# Patient Record
Sex: Female | Born: 1935
Health system: Southern US, Community
[De-identification: ages and names within clinical notes are randomized; demographics above are authoritative.]

## PROBLEM LIST (undated history)

## (undated) DIAGNOSIS — G4700 Insomnia, unspecified: Secondary | ICD-10-CM

## (undated) DIAGNOSIS — F329 Major depressive disorder, single episode, unspecified: Secondary | ICD-10-CM

## (undated) DIAGNOSIS — E785 Hyperlipidemia, unspecified: Secondary | ICD-10-CM

## (undated) DIAGNOSIS — K219 Gastro-esophageal reflux disease without esophagitis: Secondary | ICD-10-CM

## (undated) DIAGNOSIS — E039 Hypothyroidism, unspecified: Secondary | ICD-10-CM

## (undated) DIAGNOSIS — F32A Depression, unspecified: Secondary | ICD-10-CM

## (undated) HISTORY — PX: OTHER SURGICAL HISTORY: SHX169

## (undated) HISTORY — PX: NISSEN FUNDOPLICATION: SHX2091

## (undated) HISTORY — PX: ABDOMINAL HYSTERECTOMY: SHX81

## (undated) HISTORY — PX: BREAST BIOPSY: SHX20

## (undated) HISTORY — PX: COLON SURGERY: SHX602

---

## 2004-09-21 ENCOUNTER — Ambulatory Visit: Payer: Self-pay | Admitting: Internal Medicine

## 2005-11-07 ENCOUNTER — Ambulatory Visit: Payer: Self-pay | Admitting: Internal Medicine

## 2007-01-14 ENCOUNTER — Ambulatory Visit: Payer: Self-pay | Admitting: Internal Medicine

## 2007-01-21 ENCOUNTER — Ambulatory Visit: Payer: Self-pay | Admitting: Otolaryngology

## 2007-03-01 ENCOUNTER — Emergency Department: Payer: Self-pay | Admitting: Internal Medicine

## 2007-03-01 ENCOUNTER — Other Ambulatory Visit: Payer: Self-pay

## 2007-03-06 ENCOUNTER — Encounter: Payer: Self-pay | Admitting: Internal Medicine

## 2007-03-21 ENCOUNTER — Encounter: Payer: Self-pay | Admitting: Internal Medicine

## 2007-04-10 ENCOUNTER — Ambulatory Visit: Payer: Self-pay | Admitting: Specialist

## 2007-04-21 ENCOUNTER — Encounter: Payer: Self-pay | Admitting: Internal Medicine

## 2007-04-24 ENCOUNTER — Ambulatory Visit: Payer: Self-pay | Admitting: Specialist

## 2007-05-21 ENCOUNTER — Encounter: Payer: Self-pay | Admitting: Internal Medicine

## 2007-06-12 ENCOUNTER — Ambulatory Visit: Payer: Self-pay | Admitting: Cardiology

## 2007-06-21 ENCOUNTER — Encounter: Payer: Self-pay | Admitting: Internal Medicine

## 2007-07-04 ENCOUNTER — Other Ambulatory Visit: Payer: Self-pay

## 2007-07-04 ENCOUNTER — Inpatient Hospital Stay: Payer: Self-pay | Admitting: Internal Medicine

## 2007-07-17 ENCOUNTER — Inpatient Hospital Stay: Payer: Self-pay | Admitting: General Surgery

## 2007-07-17 ENCOUNTER — Other Ambulatory Visit: Payer: Self-pay

## 2007-07-21 ENCOUNTER — Encounter: Payer: Self-pay | Admitting: Internal Medicine

## 2008-06-21 ENCOUNTER — Inpatient Hospital Stay: Payer: Self-pay | Admitting: Internal Medicine

## 2008-09-07 ENCOUNTER — Ambulatory Visit: Payer: Self-pay | Admitting: Unknown Physician Specialty

## 2008-10-14 ENCOUNTER — Ambulatory Visit: Payer: Self-pay | Admitting: Internal Medicine

## 2008-10-20 ENCOUNTER — Ambulatory Visit: Payer: Self-pay | Admitting: Internal Medicine

## 2009-10-25 ENCOUNTER — Ambulatory Visit: Payer: Self-pay | Admitting: Internal Medicine

## 2009-11-07 ENCOUNTER — Ambulatory Visit: Payer: Self-pay | Admitting: Internal Medicine

## 2009-12-08 ENCOUNTER — Ambulatory Visit: Payer: Self-pay | Admitting: Pulmonary Disease

## 2009-12-08 DIAGNOSIS — R05 Cough: Secondary | ICD-10-CM

## 2009-12-08 DIAGNOSIS — I10 Essential (primary) hypertension: Secondary | ICD-10-CM | POA: Insufficient documentation

## 2009-12-08 DIAGNOSIS — R059 Cough, unspecified: Secondary | ICD-10-CM | POA: Insufficient documentation

## 2010-02-06 ENCOUNTER — Ambulatory Visit: Payer: Self-pay | Admitting: Pulmonary Disease

## 2010-04-26 ENCOUNTER — Ambulatory Visit: Payer: Self-pay | Admitting: Surgery

## 2010-07-10 ENCOUNTER — Ambulatory Visit: Payer: Self-pay | Admitting: Specialist
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
## 2010-10-31 ENCOUNTER — Emergency Department: Payer: Self-pay | Admitting: Emergency Medicine

## 2010-11-06 ENCOUNTER — Emergency Department: Payer: Self-pay | Admitting: Emergency Medicine

## 2011-03-08 ENCOUNTER — Ambulatory Visit: Payer: Self-pay | Admitting: Internal Medicine

## 2012-03-10 ENCOUNTER — Ambulatory Visit: Payer: Self-pay | Admitting: Internal Medicine

## 2012-08-29 ENCOUNTER — Ambulatory Visit: Payer: Self-pay | Admitting: Specialist

## 2013-03-11 ENCOUNTER — Ambulatory Visit: Payer: Self-pay | Admitting: Internal Medicine

## 2013-07-02 ENCOUNTER — Encounter: Payer: Self-pay | Admitting: Neurology

## 2013-12-31 ENCOUNTER — Ambulatory Visit: Payer: Self-pay | Admitting: Ophthalmology

## 2014-01-01 DIAGNOSIS — E039 Hypothyroidism, unspecified: Secondary | ICD-10-CM | POA: Insufficient documentation

## 2014-01-01 DIAGNOSIS — E785 Hyperlipidemia, unspecified: Secondary | ICD-10-CM

## 2014-01-01 DIAGNOSIS — F329 Major depressive disorder, single episode, unspecified: Secondary | ICD-10-CM | POA: Insufficient documentation

## 2014-01-01 DIAGNOSIS — E782 Mixed hyperlipidemia: Secondary | ICD-10-CM | POA: Insufficient documentation

## 2014-01-01 DIAGNOSIS — M1990 Unspecified osteoarthritis, unspecified site: Secondary | ICD-10-CM | POA: Insufficient documentation

## 2014-01-01 DIAGNOSIS — F32A Depression, unspecified: Secondary | ICD-10-CM | POA: Insufficient documentation

## 2014-01-01 DIAGNOSIS — K219 Gastro-esophageal reflux disease without esophagitis: Secondary | ICD-10-CM | POA: Insufficient documentation

## 2014-01-01 HISTORY — DX: Hyperlipidemia, unspecified: E78.5

## 2014-01-12 ENCOUNTER — Ambulatory Visit: Payer: Self-pay | Admitting: Ophthalmology

## 2014-02-17 ENCOUNTER — Ambulatory Visit: Payer: Self-pay | Admitting: Ophthalmology

## 2014-05-20 ENCOUNTER — Ambulatory Visit: Payer: Self-pay | Admitting: Internal Medicine

## 2014-10-08 DIAGNOSIS — H35371 Puckering of macula, right eye: Secondary | ICD-10-CM | POA: Diagnosis not present

## 2014-10-25 DIAGNOSIS — J986 Disorders of diaphragm: Secondary | ICD-10-CM | POA: Diagnosis not present

## 2014-10-25 DIAGNOSIS — J849 Interstitial pulmonary disease, unspecified: Secondary | ICD-10-CM | POA: Diagnosis not present

## 2014-10-25 DIAGNOSIS — R05 Cough: Secondary | ICD-10-CM | POA: Diagnosis not present

## 2014-10-25 DIAGNOSIS — R0609 Other forms of dyspnea: Secondary | ICD-10-CM | POA: Diagnosis not present

## 2014-10-27 DIAGNOSIS — M170 Bilateral primary osteoarthritis of knee: Secondary | ICD-10-CM | POA: Diagnosis not present

## 2014-11-03 ENCOUNTER — Ambulatory Visit: Payer: Self-pay | Admitting: Specialist

## 2014-11-03 DIAGNOSIS — R05 Cough: Secondary | ICD-10-CM | POA: Diagnosis not present

## 2014-11-03 DIAGNOSIS — R918 Other nonspecific abnormal finding of lung field: Secondary | ICD-10-CM | POA: Diagnosis not present

## 2014-11-03 DIAGNOSIS — K449 Diaphragmatic hernia without obstruction or gangrene: Secondary | ICD-10-CM | POA: Diagnosis not present

## 2014-11-03 DIAGNOSIS — I2510 Atherosclerotic heart disease of native coronary artery without angina pectoris: Secondary | ICD-10-CM | POA: Diagnosis not present

## 2014-11-10 DIAGNOSIS — R918 Other nonspecific abnormal finding of lung field: Secondary | ICD-10-CM | POA: Diagnosis not present

## 2014-11-10 DIAGNOSIS — J986 Disorders of diaphragm: Secondary | ICD-10-CM | POA: Diagnosis not present

## 2014-11-10 DIAGNOSIS — R0609 Other forms of dyspnea: Secondary | ICD-10-CM | POA: Diagnosis not present

## 2014-11-10 DIAGNOSIS — R05 Cough: Secondary | ICD-10-CM | POA: Diagnosis not present

## 2014-11-11 DIAGNOSIS — J028 Acute pharyngitis due to other specified organisms: Secondary | ICD-10-CM | POA: Diagnosis not present

## 2014-11-11 DIAGNOSIS — B9689 Other specified bacterial agents as the cause of diseases classified elsewhere: Secondary | ICD-10-CM | POA: Diagnosis not present

## 2014-11-11 DIAGNOSIS — R05 Cough: Secondary | ICD-10-CM | POA: Diagnosis not present

## 2014-11-11 DIAGNOSIS — R911 Solitary pulmonary nodule: Secondary | ICD-10-CM | POA: Diagnosis not present

## 2014-11-24 ENCOUNTER — Ambulatory Visit
Admit: 2014-11-24 | Disposition: A | Discharge status: Home / Self Care | Payer: Self-pay | Attending: Internal Medicine | Admitting: Internal Medicine

## 2014-11-24 DIAGNOSIS — R32 Unspecified urinary incontinence: Secondary | ICD-10-CM | POA: Diagnosis not present

## 2014-11-24 DIAGNOSIS — F064 Anxiety disorder due to known physiological condition: Secondary | ICD-10-CM | POA: Diagnosis not present

## 2014-11-24 DIAGNOSIS — K449 Diaphragmatic hernia without obstruction or gangrene: Secondary | ICD-10-CM | POA: Diagnosis not present

## 2014-11-24 DIAGNOSIS — R05 Cough: Secondary | ICD-10-CM | POA: Diagnosis not present

## 2014-11-24 DIAGNOSIS — J9809 Other diseases of bronchus, not elsewhere classified: Secondary | ICD-10-CM | POA: Diagnosis not present

## 2014-12-01 DIAGNOSIS — R0789 Other chest pain: Secondary | ICD-10-CM | POA: Diagnosis not present

## 2014-12-01 DIAGNOSIS — R32 Unspecified urinary incontinence: Secondary | ICD-10-CM | POA: Diagnosis not present

## 2014-12-01 DIAGNOSIS — R05 Cough: Secondary | ICD-10-CM | POA: Diagnosis not present

## 2014-12-01 DIAGNOSIS — K210 Gastro-esophageal reflux disease with esophagitis: Secondary | ICD-10-CM | POA: Diagnosis not present

## 2014-12-08 ENCOUNTER — Ambulatory Visit
Admit: 2014-12-08 | Disposition: A | Discharge status: Home / Self Care | Payer: Self-pay | Attending: Internal Medicine | Admitting: Internal Medicine

## 2014-12-08 DIAGNOSIS — F064 Anxiety disorder due to known physiological condition: Secondary | ICD-10-CM | POA: Diagnosis not present

## 2014-12-08 DIAGNOSIS — M25552 Pain in left hip: Secondary | ICD-10-CM | POA: Diagnosis not present

## 2014-12-08 DIAGNOSIS — S79912A Unspecified injury of left hip, initial encounter: Secondary | ICD-10-CM | POA: Diagnosis not present

## 2014-12-08 DIAGNOSIS — R32 Unspecified urinary incontinence: Secondary | ICD-10-CM | POA: Diagnosis not present

## 2014-12-11 NOTE — Op Note (Signed)
PATIENT NAME:  Abigail Strong, Abigail Strong MR#:  [id number] DATE OF BIRTH:  12-23-1929  DATE OF PROCEDURE:  01/12/2014  PREOPERATIVE DIAGNOSIS: Visually significant cataract of the left eye.   POSTOPERATIVE DIAGNOSIS: Visually significant cataract of the left eye.   OPERATIVE PROCEDURE: Cataract extraction by phacoemulsification with implant of intraocular lens to left eye.   SURGEON: Birder Robson, MD.   ANESTHESIA:  1. Managed anesthesia care.  2. Topical tetracaine drops followed by 2% Xylocaine jelly applied in the preoperative holding area.   COMPLICATIONS: None.   TECHNIQUE:  Stop-and-chop.  DESCRIPTION OF PROCEDURE: The patient was examined and consented in the preoperative holding area where the aforementioned topical anesthesia was applied to the left eye and then brought back to the Operating Room where the left eye was prepped and draped in the usual sterile ophthalmic fashion and a lid speculum was placed. A paracentesis was created with the side port blade and the anterior chamber was filled with viscoelastic. A near clear corneal incision was performed with the steel keratome. A continuous curvilinear capsulorrhexis was performed with a cystotome followed by the capsulorrhexis forceps. Hydrodissection and hydrodelineation were carried out with BSS on a blunt cannula. The lens was removed in a stop-and-chop technique and the remaining cortical material was removed with the irrigation-aspiration handpiece. The capsular bag was inflated with viscoelastic and the Tecnis ZCB00, 22.5-diopter lens, serial number [id number], was placed in the capsular bag without complication. The remaining viscoelastic was removed from the eye with the irrigation-aspiration handpiece. The wounds were hydrated. The anterior chamber was flushed with Miostat and the eye was inflated to physiologic pressure. 0.1 mL of cefuroxime concentration 10 mg/mL was placed in the anterior chamber. The wounds were found to be  water tight. The eye was dressed with Vigamox. The patient was given protective glasses to wear throughout the day and a shield with which to sleep tonight. The patient was also given drops with which to begin a drop regimen today and will follow-up with me in one day.   ____________________________ Livingston Diones. Danyel Tobey, MD wlp:cs D: 01/12/2014 17:20:00 ET T: 01/12/2014 19:40:51 ET JOB#: [id number]  cc: Andree Golphin L. Merriam Brandner, MD, <Dictator> Livingston Diones Cami Delawder MD ELECTRONICALLY SIGNED 01/20/2014 13:43

## 2014-12-15 DIAGNOSIS — R32 Unspecified urinary incontinence: Secondary | ICD-10-CM | POA: Diagnosis not present

## 2014-12-15 DIAGNOSIS — J439 Emphysema, unspecified: Secondary | ICD-10-CM | POA: Diagnosis not present

## 2014-12-15 DIAGNOSIS — M25552 Pain in left hip: Secondary | ICD-10-CM | POA: Diagnosis not present

## 2014-12-16 ENCOUNTER — Ambulatory Visit
Admit: 2014-12-16 | Disposition: A | Discharge status: Home / Self Care | Payer: Self-pay | Attending: Internal Medicine | Admitting: Internal Medicine

## 2014-12-16 DIAGNOSIS — Z1231 Encounter for screening mammogram for malignant neoplasm of breast: Secondary | ICD-10-CM | POA: Diagnosis not present

## 2015-01-21 DIAGNOSIS — M170 Bilateral primary osteoarthritis of knee: Secondary | ICD-10-CM | POA: Diagnosis not present

## 2015-02-02 DIAGNOSIS — F064 Anxiety disorder due to known physiological condition: Secondary | ICD-10-CM | POA: Diagnosis not present

## 2015-02-02 DIAGNOSIS — R32 Unspecified urinary incontinence: Secondary | ICD-10-CM | POA: Diagnosis not present

## 2015-02-02 DIAGNOSIS — F5102 Adjustment insomnia: Secondary | ICD-10-CM | POA: Diagnosis not present

## 2015-02-02 DIAGNOSIS — R918 Other nonspecific abnormal finding of lung field: Secondary | ICD-10-CM | POA: Diagnosis not present

## 2015-02-14 ENCOUNTER — Other Ambulatory Visit (HOSPITAL_COMMUNITY): Payer: Self-pay | Admitting: Cardiology

## 2015-02-14 DIAGNOSIS — R61 Generalized hyperhidrosis: Secondary | ICD-10-CM | POA: Diagnosis not present

## 2015-02-14 DIAGNOSIS — R911 Solitary pulmonary nodule: Secondary | ICD-10-CM

## 2015-02-14 DIAGNOSIS — B359 Dermatophytosis, unspecified: Secondary | ICD-10-CM | POA: Diagnosis not present

## 2015-02-14 DIAGNOSIS — F064 Anxiety disorder due to known physiological condition: Secondary | ICD-10-CM | POA: Diagnosis not present

## 2015-02-16 DIAGNOSIS — M170 Bilateral primary osteoarthritis of knee: Secondary | ICD-10-CM | POA: Diagnosis not present

## 2015-02-22 ENCOUNTER — Ambulatory Visit: Admission: RE | Admit: 2015-02-22 | Payer: Medicare Other | Source: Ambulatory Visit

## 2015-02-22 ENCOUNTER — Ambulatory Visit
Admission: RE | Admit: 2015-02-22 | Discharge: 2015-02-22 | Disposition: A | Discharge status: Home / Self Care | Payer: Medicare Other | Source: Ambulatory Visit | Attending: Cardiology | Admitting: Cardiology

## 2015-02-22 DIAGNOSIS — R918 Other nonspecific abnormal finding of lung field: Secondary | ICD-10-CM | POA: Diagnosis not present

## 2015-02-22 DIAGNOSIS — R911 Solitary pulmonary nodule: Secondary | ICD-10-CM

## 2015-02-22 DIAGNOSIS — R05 Cough: Secondary | ICD-10-CM | POA: Diagnosis not present

## 2015-02-23 DIAGNOSIS — M170 Bilateral primary osteoarthritis of knee: Secondary | ICD-10-CM | POA: Diagnosis not present

## 2015-03-01 DIAGNOSIS — R32 Unspecified urinary incontinence: Secondary | ICD-10-CM | POA: Diagnosis not present

## 2015-03-01 DIAGNOSIS — R911 Solitary pulmonary nodule: Secondary | ICD-10-CM | POA: Diagnosis not present

## 2015-03-01 DIAGNOSIS — F064 Anxiety disorder due to known physiological condition: Secondary | ICD-10-CM | POA: Diagnosis not present

## 2015-03-02 DIAGNOSIS — M170 Bilateral primary osteoarthritis of knee: Secondary | ICD-10-CM | POA: Diagnosis not present

## 2015-03-17 DIAGNOSIS — R42 Dizziness and giddiness: Secondary | ICD-10-CM | POA: Diagnosis not present

## 2015-03-17 DIAGNOSIS — R32 Unspecified urinary incontinence: Secondary | ICD-10-CM | POA: Diagnosis not present

## 2015-03-17 DIAGNOSIS — H8193 Unspecified disorder of vestibular function, bilateral: Secondary | ICD-10-CM | POA: Diagnosis not present

## 2015-03-22 ENCOUNTER — Emergency Department: Payer: Medicare Other

## 2015-03-22 ENCOUNTER — Encounter: Payer: Self-pay | Admitting: Occupational Medicine

## 2015-03-22 ENCOUNTER — Inpatient Hospital Stay
Admission: EM | Admit: 2015-03-22 | Discharge: 2015-03-25 | DRG: 392 | Disposition: A | Discharge status: Home / Self Care | Payer: Medicare Other | Attending: Internal Medicine | Admitting: Internal Medicine

## 2015-03-22 DIAGNOSIS — E785 Hyperlipidemia, unspecified: Secondary | ICD-10-CM | POA: Diagnosis present

## 2015-03-22 DIAGNOSIS — Z79899 Other long term (current) drug therapy: Secondary | ICD-10-CM

## 2015-03-22 DIAGNOSIS — K449 Diaphragmatic hernia without obstruction or gangrene: Secondary | ICD-10-CM | POA: Diagnosis not present

## 2015-03-22 DIAGNOSIS — Z791 Long term (current) use of non-steroidal anti-inflammatories (NSAID): Secondary | ICD-10-CM

## 2015-03-22 DIAGNOSIS — F329 Major depressive disorder, single episode, unspecified: Secondary | ICD-10-CM | POA: Diagnosis present

## 2015-03-22 DIAGNOSIS — K5732 Diverticulitis of large intestine without perforation or abscess without bleeding: Principal | ICD-10-CM | POA: Diagnosis present

## 2015-03-22 DIAGNOSIS — R531 Weakness: Secondary | ICD-10-CM

## 2015-03-22 DIAGNOSIS — Z9079 Acquired absence of other genital organ(s): Secondary | ICD-10-CM | POA: Diagnosis not present

## 2015-03-22 DIAGNOSIS — Z7982 Long term (current) use of aspirin: Secondary | ICD-10-CM | POA: Diagnosis not present

## 2015-03-22 DIAGNOSIS — Z90710 Acquired absence of both cervix and uterus: Secondary | ICD-10-CM | POA: Diagnosis not present

## 2015-03-22 DIAGNOSIS — Z9049 Acquired absence of other specified parts of digestive tract: Secondary | ICD-10-CM | POA: Diagnosis present

## 2015-03-22 DIAGNOSIS — M4310 Spondylolisthesis, site unspecified: Secondary | ICD-10-CM | POA: Diagnosis present

## 2015-03-22 DIAGNOSIS — K219 Gastro-esophageal reflux disease without esophagitis: Secondary | ICD-10-CM | POA: Diagnosis present

## 2015-03-22 DIAGNOSIS — K4030 Unilateral inguinal hernia, with obstruction, without gangrene, not specified as recurrent: Secondary | ICD-10-CM | POA: Diagnosis not present

## 2015-03-22 DIAGNOSIS — K5792 Diverticulitis of intestine, part unspecified, without perforation or abscess without bleeding: Secondary | ICD-10-CM | POA: Diagnosis present

## 2015-03-22 DIAGNOSIS — Z9889 Other specified postprocedural states: Secondary | ICD-10-CM | POA: Diagnosis not present

## 2015-03-22 DIAGNOSIS — G4700 Insomnia, unspecified: Secondary | ICD-10-CM | POA: Diagnosis not present

## 2015-03-22 DIAGNOSIS — E039 Hypothyroidism, unspecified: Secondary | ICD-10-CM | POA: Diagnosis not present

## 2015-03-22 DIAGNOSIS — R1030 Lower abdominal pain, unspecified: Secondary | ICD-10-CM | POA: Diagnosis not present

## 2015-03-22 DIAGNOSIS — R05 Cough: Secondary | ICD-10-CM | POA: Diagnosis not present

## 2015-03-22 HISTORY — DX: Insomnia, unspecified: G47.00

## 2015-03-22 HISTORY — DX: Hyperlipidemia, unspecified: E78.5

## 2015-03-22 HISTORY — DX: Gastro-esophageal reflux disease without esophagitis: K21.9

## 2015-03-22 HISTORY — DX: Major depressive disorder, single episode, unspecified: F32.9

## 2015-03-22 HISTORY — DX: Hypothyroidism, unspecified: E03.9

## 2015-03-22 HISTORY — DX: Depression, unspecified: F32.A

## 2015-03-22 LAB — URINALYSIS COMPLETE WITH MICROSCOPIC (ARMC ONLY)
Bilirubin Urine: NEGATIVE
Glucose, UA: NEGATIVE mg/dL
Hgb urine dipstick: NEGATIVE
Leukocytes, UA: NEGATIVE
Nitrite: NEGATIVE
Protein, ur: NEGATIVE mg/dL
Specific Gravity, Urine: 1.008 (ref 1.005–1.030)
pH: 6 (ref 5.0–8.0)

## 2015-03-22 LAB — CBC
HCT: 38.2 % (ref 35.0–47.0)
Hemoglobin: 12.7 g/dL (ref 12.0–16.0)
MCH: 29.3 pg (ref 26.0–34.0)
MCHC: 33.2 g/dL (ref 32.0–36.0)
MCV: 88.2 fL (ref 80.0–100.0)
Platelets: 193 10*3/uL (ref 150–440)
RBC: 4.33 MIL/uL (ref 3.80–5.20)
RDW: 14.4 % (ref 11.5–14.5)
WBC: 14.1 10*3/uL — ABNORMAL HIGH (ref 3.6–11.0)

## 2015-03-22 LAB — BASIC METABOLIC PANEL
Anion gap: 9 (ref 5–15)
BUN: 19 mg/dL (ref 6–20)
CO2: 27 mmol/L (ref 22–32)
Calcium: 9 mg/dL (ref 8.9–10.3)
Chloride: 101 mmol/L (ref 101–111)
Creatinine, Ser: 0.68 mg/dL (ref 0.44–1.00)
GFR calc Af Amer: >60 mL/min (ref >60)
GFR calc non Af Amer: >60 mL/min (ref >60)
Glucose, Bld: 97 mg/dL (ref 65–99)
Potassium: 3.9 mmol/L (ref 3.5–5.1)
Sodium: 137 mmol/L (ref 135–145)

## 2015-03-22 LAB — TROPONIN I: Troponin I: <0.03 ng/mL (ref <0.031)

## 2015-03-22 LAB — TSH: TSH: 5.846 u[IU]/mL — ABNORMAL HIGH (ref 0.350–4.500)

## 2015-03-22 MED ORDER — IOHEXOL 240 MG/ML SOLN
25.0000 mL | Freq: Once | INTRAMUSCULAR | Status: AC | PRN
  Administered 2015-03-22: 25 mL via ORAL

## 2015-03-22 MED ORDER — IOHEXOL 300 MG/ML  SOLN
100.0000 mL | Freq: Once | INTRAMUSCULAR | Status: AC | PRN
  Administered 2015-03-22: 100 mL via INTRAVENOUS

## 2015-03-22 MED ORDER — SODIUM CHLORIDE 0.9 % IV BOLUS (SEPSIS)
500.0000 mL | Freq: Once | INTRAVENOUS | Status: AC
  Administered 2015-03-22: 500 mL via INTRAVENOUS
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
## 2015-03-22 NOTE — ED Provider Notes (Addendum)
[facility] Emergency Department Provider Note  ____________________________________________  Time seen: Approximately 945pm  I have reviewed the triage vital signs and the nursing notes.   HISTORY  Chief Complaint Weakness    HPI Abigail Strong is a 81 y.o. female with a history of GERD and hypothyroidism who presents today with 1 day of confusion and fever. She said the fever has resolved today but she just feels weak all over. She says she was having difficulty walking secondary to the weakness. However, she denies any focal weakness. Denies any dysuria but does say she has mild lower abdominal pain. Says she has chronic lower extremity edema which is unchanged. No nausea vomiting or diarrhea. Has a chronic cough which is also unchanged. No known sick contacts.   Past Medical History  Diagnosis Date  . GERD (gastroesophageal reflux disease)   . Thyroid disease   . Insomnia   . Depression   . Hyperlipidemia     Patient Active Problem List   Diagnosis Date Noted  . HYPERTENSION 12/08/2009  . COUGH 12/08/2009    Past Surgical History  Procedure Laterality Date  . Colon surgery    . Abdominal hysterectomy      No current outpatient prescriptions on file.  Allergies Penicillins and Sulfonamide derivatives  History reviewed. No pertinent family history.  Social History History  Substance Use Topics  . Smoking status: Never Smoker   . Smokeless tobacco: Not on file  . Alcohol Use: No    Review of Systems Constitutional: fever yesterday Eyes: No visual changes. ENT: No sore throat. Cardiovascular: Denies chest pain. Respiratory: Denies shortness of breath. Gastrointestinal:   No nausea, no vomiting.  No diarrhea.  No constipation. Genitourinary: Negative for dysuria. Musculoskeletal: Negative for back pain. Skin: Negative for rash. Neurological: Negative for headaches, focal weakness or numbness.  10-point ROS otherwise  negative.  ____________________________________________   PHYSICAL EXAM:  VITAL SIGNS: ED Triage Vitals  Enc Vitals Group     BP 03/22/15 2053 108/74 mmHg     Pulse Rate 03/22/15 2053 87     Resp 03/22/15 2053 20     Temp 03/22/15 2053 98.1 F (36.7 C)     Temp Source 03/22/15 2053 Oral     SpO2 03/22/15 2053 94 %     Weight 03/22/15 2053 174 lb (78.926 kg)     Height 03/22/15 2053 5\' 4"  (1.626 m)     Head Cir --      Peak Flow --      Pain Score --      Pain Loc --      Pain Edu? --      Excl. in [place]? --     Constitutional: Alert and oriented. Well appearing and in no acute distress. Eyes: Conjunctivae are normal. PERRL. EOMI. Head: Atraumatic. Nose: No congestion/rhinnorhea. Mouth/Throat: Mucous membranes are moist.  Oropharynx non-erythematous. Neck: No stridor.   Cardiovascular: Normal rate, regular rhythm. Grossly normal heart sounds.  Good peripheral circulation. Respiratory: Normal respiratory effort.  No retractions. Lungs CTAB. Gastrointestinal: Soft with mild lower abdominal tenderness without focality.. No distention. No abdominal bruits. No CVA tenderness. Musculoskeletal: mild bilateral lower extremity edema which the patient says is at her baseline.  No joint effusions. Neurologic:  Normal speech and language. No gross focal neurologic deficits are appreciated. No gait instability. Skin:  Skin is warm, dry and intact. No rash noted. Psychiatric: Mood and affect are normal. Speech and behavior are normal.  ____________________________________________  LABS (all labs ordered are listed, but only abnormal results are displayed)  Labs Reviewed  CBC - Abnormal; Notable for the following:    WBC 14.1 (*)    All other components within normal limits  URINALYSIS COMPLETEWITH MICROSCOPIC ([facility] ONLY) - Abnormal; Notable for the following:    Color, Urine YELLOW (*)    APPearance CLEAR (*)    Ketones, ur TRACE (*)    Bacteria, UA RARE (*)    Squamous  Epithelial / LPF 0-5 (*)    All other components within normal limits  TSH - Abnormal; Notable for the following:    TSH 5.846 (*)    All other components within normal limits  URINE CULTURE  BASIC METABOLIC PANEL  TROPONIN I   ____________________________________________  EKG  ED ECG REPORT I, Doran Stabler, the attending physician, personally viewed and interpreted this ECG.   Date: 03/22/2015  EKG Time: 2055  Rate: 89  Rhythm: normal sinus rhythm  Axis: left axis deviation  Intervals:none  ST&T Change: no ST segment elevation or depression. No abnormal T-wave inversions.  ____________________________________________  RADIOLOGY  Pending CAT scan of the abdomen and pelvis. ____________________________________________   PROCEDURES    ____________________________________________   INITIAL IMPRESSION / ASSESSMENT AND PLAN / ED COURSE  Pertinent labs & imaging results that were available during my care of the patient were reviewed by me and considered in my medical decision making (see chart for details).  ----------------------------------------- 11:11 PM on 03/22/2015 -----------------------------------------  Patient pending lab work as well as results of the abdomen and pelvis CAT scan. Signed out to Dr. Dahlia Client. ____________________________________________   FINAL CLINICAL IMPRESSION(S) / ED DIAGNOSES  Acute weakness and abdominal pain. Initial visit.    Orbie Pyo, MD 03/22/15 2311  Addition to physical exam above:  Rectal exam without fecal impaction. Small amount of heme negative stool.    Orbie Pyo, MD 03/23/15 [phone]

## 2015-03-22 NOTE — ED Notes (Signed)
Patient transported to CT 

## 2015-03-23 ENCOUNTER — Encounter: Payer: Self-pay | Admitting: Internal Medicine

## 2015-03-23 DIAGNOSIS — Z9889 Other specified postprocedural states: Secondary | ICD-10-CM | POA: Diagnosis not present

## 2015-03-23 DIAGNOSIS — Z9079 Acquired absence of other genital organ(s): Secondary | ICD-10-CM | POA: Diagnosis present

## 2015-03-23 DIAGNOSIS — R531 Weakness: Secondary | ICD-10-CM | POA: Diagnosis present

## 2015-03-23 DIAGNOSIS — Z7982 Long term (current) use of aspirin: Secondary | ICD-10-CM | POA: Diagnosis not present

## 2015-03-23 DIAGNOSIS — K449 Diaphragmatic hernia without obstruction or gangrene: Secondary | ICD-10-CM | POA: Diagnosis present

## 2015-03-23 DIAGNOSIS — K5792 Diverticulitis of intestine, part unspecified, without perforation or abscess without bleeding: Secondary | ICD-10-CM | POA: Diagnosis present

## 2015-03-23 DIAGNOSIS — G4700 Insomnia, unspecified: Secondary | ICD-10-CM | POA: Diagnosis present

## 2015-03-23 DIAGNOSIS — Z9049 Acquired absence of other specified parts of digestive tract: Secondary | ICD-10-CM | POA: Diagnosis present

## 2015-03-23 DIAGNOSIS — Z79899 Other long term (current) drug therapy: Secondary | ICD-10-CM | POA: Diagnosis not present

## 2015-03-23 DIAGNOSIS — F329 Major depressive disorder, single episode, unspecified: Secondary | ICD-10-CM | POA: Diagnosis present

## 2015-03-23 DIAGNOSIS — K219 Gastro-esophageal reflux disease without esophagitis: Secondary | ICD-10-CM | POA: Diagnosis present

## 2015-03-23 DIAGNOSIS — E785 Hyperlipidemia, unspecified: Secondary | ICD-10-CM | POA: Diagnosis present

## 2015-03-23 DIAGNOSIS — M4310 Spondylolisthesis, site unspecified: Secondary | ICD-10-CM | POA: Diagnosis present

## 2015-03-23 DIAGNOSIS — E039 Hypothyroidism, unspecified: Secondary | ICD-10-CM | POA: Diagnosis present

## 2015-03-23 DIAGNOSIS — Z90710 Acquired absence of both cervix and uterus: Secondary | ICD-10-CM | POA: Diagnosis not present

## 2015-03-23 DIAGNOSIS — K5732 Diverticulitis of large intestine without perforation or abscess without bleeding: Secondary | ICD-10-CM | POA: Diagnosis present

## 2015-03-23 DIAGNOSIS — Z791 Long term (current) use of non-steroidal anti-inflammatories (NSAID): Secondary | ICD-10-CM | POA: Diagnosis not present

## 2015-03-23 LAB — TROPONIN I: Troponin I: 0.03 ng/mL (ref <0.031)

## 2015-03-23 LAB — HEMOGLOBIN A1C: Hgb A1c MFr Bld: 5.8 % (ref 4.0–6.0)

## 2015-03-23 MED ORDER — LEVOTHYROXINE SODIUM 75 MCG PO TABS
125.0000 ug | ORAL_TABLET | Freq: Every day | ORAL | Status: DC
  Administered 2015-03-23 – 2015-03-25 (×3): 125 ug via ORAL
  Filled 2015-03-23 (×3): qty 1

## 2015-03-23 MED ORDER — MECLIZINE HCL 25 MG PO TABS
12.5000 mg | ORAL_TABLET | Freq: Two times a day (BID) | ORAL | Status: DC
Start: 2015-03-23 — End: 2015-03-25
  Administered 2015-03-23 – 2015-03-25 (×5): 12.5 mg via ORAL
  Filled 2015-03-23 (×6): qty 1

## 2015-03-23 MED ORDER — PRAVASTATIN SODIUM 20 MG PO TABS
40.0000 mg | ORAL_TABLET | Freq: Every day | ORAL | Status: DC
  Administered 2015-03-23 – 2015-03-24 (×2): 40 mg via ORAL
  Filled 2015-03-23 (×2): qty 2

## 2015-03-23 MED ORDER — SODIUM CHLORIDE 0.9 % IV SOLN
INTRAVENOUS | Status: DC
  Administered 2015-03-23 – 2015-03-25 (×3): via INTRAVENOUS

## 2015-03-23 MED ORDER — ADULT MULTIVITAMIN W/MINERALS CH
1.0000 | ORAL_TABLET | Freq: Every day | ORAL | Status: DC
  Administered 2015-03-23 – 2015-03-25 (×3): 1 via ORAL
  Filled 2015-03-23 (×3): qty 1

## 2015-03-23 MED ORDER — ASPIRIN EC 81 MG PO TBEC
81.0000 mg | DELAYED_RELEASE_TABLET | Freq: Every day | ORAL | Status: DC
  Administered 2015-03-23 – 2015-03-25 (×3): 81 mg via ORAL
  Filled 2015-03-23 (×3): qty 1

## 2015-03-23 MED ORDER — CITALOPRAM HYDROBROMIDE 20 MG PO TABS
20.0000 mg | ORAL_TABLET | Freq: Every day | ORAL | Status: DC
  Administered 2015-03-23 – 2015-03-25 (×3): 20 mg via ORAL
  Filled 2015-03-23 (×3): qty 1

## 2015-03-23 MED ORDER — HEPARIN SODIUM (PORCINE) 5000 UNIT/ML IJ SOLN
5000.0000 [IU] | Freq: Three times a day (TID) | INTRAMUSCULAR | Status: DC
  Administered 2015-03-23 – 2015-03-25 (×7): 5000 [IU] via SUBCUTANEOUS
  Filled 2015-03-23 (×6): qty 1

## 2015-03-23 MED ORDER — METRONIDAZOLE IN NACL 5-0.79 MG/ML-% IV SOLN
500.0000 mg | Freq: Three times a day (TID) | INTRAVENOUS | Status: DC
  Administered 2015-03-23 – 2015-03-25 (×6): 500 mg via INTRAVENOUS
  Filled 2015-03-23 (×10): qty 100

## 2015-03-23 MED ORDER — ONDANSETRON HCL 4 MG PO TABS: 4.0000 mg | ORAL_TABLET | Freq: Four times a day (QID) | ORAL | Status: DC | PRN

## 2015-03-23 MED ORDER — CIPROFLOXACIN IN D5W 400 MG/200ML IV SOLN
400.0000 mg | Freq: Two times a day (BID) | INTRAVENOUS | Status: DC
  Administered 2015-03-23 – 2015-03-25 (×4): 400 mg via INTRAVENOUS
  Filled 2015-03-23 (×7): qty 200

## 2015-03-23 MED ORDER — CIPROFLOXACIN HCL 500 MG PO TABS
750.0000 mg | ORAL_TABLET | Freq: Two times a day (BID) | ORAL | Status: DC
  Administered 2015-03-23: 750 mg via ORAL
  Filled 2015-03-23: qty 1

## 2015-03-23 MED ORDER — VITAMIN C 500 MG PO TABS
1000.0000 mg | ORAL_TABLET | Freq: Every day | ORAL | Status: DC
  Administered 2015-03-23 – 2015-03-25 (×3): 1000 mg via ORAL
  Filled 2015-03-23 (×3): qty 2

## 2015-03-23 MED ORDER — TRAZODONE HCL 50 MG PO TABS
50.0000 mg | ORAL_TABLET | Freq: Every day | ORAL | Status: DC
  Administered 2015-03-23 – 2015-03-24 (×2): 50 mg via ORAL
  Filled 2015-03-23 (×2): qty 1

## 2015-03-23 MED ORDER — VITAMIN D 1000 UNITS PO TABS
5000.0000 [IU] | ORAL_TABLET | Freq: Every day | ORAL | Status: DC
  Administered 2015-03-23 – 2015-03-25 (×3): 5000 [IU] via ORAL
  Filled 2015-03-23 (×3): qty 5

## 2015-03-23 MED ORDER — CIPROFLOXACIN IN D5W 400 MG/200ML IV SOLN
400.0000 mg | Freq: Once | INTRAVENOUS | Status: AC
  Administered 2015-03-23: 400 mg via INTRAVENOUS
  Filled 2015-03-23: qty 200

## 2015-03-23 MED ORDER — ZOLPIDEM TARTRATE 5 MG PO TABS: 5.0000 mg | ORAL_TABLET | Freq: Every evening | ORAL | Status: DC | PRN

## 2015-03-23 MED ORDER — METRONIDAZOLE IN NACL 5-0.79 MG/ML-% IV SOLN
500.0000 mg | Freq: Once | INTRAVENOUS | Status: AC
  Administered 2015-03-23: 500 mg via INTRAVENOUS
  Filled 2015-03-23: qty 100

## 2015-03-23 MED ORDER — ONDANSETRON HCL 4 MG/2ML IJ SOLN: 4.0000 mg | Freq: Four times a day (QID) | INTRAMUSCULAR | Status: DC | PRN

## 2015-03-23 MED ORDER — METRONIDAZOLE 500 MG PO TABS
500.0000 mg | ORAL_TABLET | Freq: Four times a day (QID) | ORAL | Status: DC
Start: 2015-03-23 — End: 2015-03-23
  Administered 2015-03-23: 500 mg via ORAL
  Filled 2015-03-23: qty 1

## 2015-03-23 MED ORDER — MORPHINE SULFATE 2 MG/ML IJ SOLN: 1.0000 mg | INTRAMUSCULAR | Status: DC | PRN

## 2015-03-23 MED ORDER — DOCUSATE SODIUM 100 MG PO CAPS
100.0000 mg | ORAL_CAPSULE | Freq: Two times a day (BID) | ORAL | Status: DC
  Administered 2015-03-23 – 2015-03-25 (×5): 100 mg via ORAL
  Filled 2015-03-23 (×5): qty 1

## 2015-03-23 NOTE — ED Provider Notes (Signed)
-----------------------------------------   12:37 AM on 03/23/2015 -----------------------------------------   Blood pressure 98/68, pulse 83, temperature 98.1 F (36.7 C), temperature source Oral, resp. rate 25, height 5\' 4"  (1.626 m), weight 174 lb (78.926 kg), SpO2 92 %.  Assuming care from Dr. Clearnce Hasten.  In short, Abigail Strong is a 81 y.o. female with a chief complaint of Weakness .  Refer to the original H&P for additional details.  The current plan of care is to follow up the results of the CT Scan.  CT Scan - Acute Diverticulitis noted at the mid to distal sigmoid colon with associated wall thickening and inflamed diverticula and surrounding soft tissue inflammation. No evidence of perforation or abscess formation at this time. Scattered diverticulosis along the entirety of the sigmoid colon.   I ordered the patient some ciprofloxacin and flagyl IV. He will be admitted to the hospital for IV antibiotics.   Loney Hering, MD 03/23/15 [id number]

## 2015-03-23 NOTE — Care Management (Signed)
Spoke with patient from discharge planning. Patient is from home with two adult children. Stated that she is fairly independent and that she drives herself to appointments. She uses a walker at home and house is on one level. No home 02. PCP is Dr Lavera Guise. No difficulty with medications. No needs anticipated at discharge.

## 2015-03-23 NOTE — H&P (Addendum)
Abigail Strong is an 81 y.o. female.   Chief Complaint: Abdominal pain HPI: The patient presents emergency department complaining of abdominal pain 2 days. He states the pain began in her lower quadrants bilaterally and radiated to her back. Today the pain seemed to worsen and radiate up her right flank. She denies any nausea vomiting or diarrhea. She admits to having some vague chest pain yesterday with associated shortness of breath but both symptoms are gone at this time. In the emergency department the patient underwent a CT scan of her abdomen which showed diverticulitis which prompted the emergency department to call for admission.  Past Medical History  Diagnosis Date  . GERD (gastroesophageal reflux disease)   . Hypothyroidism   . Insomnia   . Depression   . Hyperlipidemia     Past Surgical History  Procedure Laterality Date  . Colon surgery      partial colectomy  . Abdominal hysterectomy    . Nissen fundoplication      History reviewed. No pertinent family history. Social History:  reports that she has never smoked. She does not have any smokeless tobacco history on file. She reports that she does not drink alcohol or use illicit drugs.  Allergies:  Allergies  Allergen Reactions  . Penicillins Other (See Comments)    Dry throat  . Sulfonamide Derivatives     Medications Prior to Admission  Medication Sig Dispense Refill  . acetaminophen (TYLENOL) 500 MG tablet Take 1,000 mg by mouth every 6 (six) hours as needed.    . Ascorbic Acid (VITAMIN C) 1000 MG tablet Take 1,000 mg by mouth daily.    [name] Kitchen aspirin EC 81 MG tablet Take 81 mg by mouth daily.    . Cholecalciferol (VITAMIN D3) 5000 UNITS CAPS Take 5,000 Units by mouth daily.    . citalopram (CELEXA) 20 MG tablet Take 20 mg by mouth daily.    . Ginkgo Biloba 120 MG CAPS Take 120 mg by mouth daily.    [name] Kitchen levothyroxine (SYNTHROID, LEVOTHROID) 125 MCG tablet Take 125 mcg by mouth daily before breakfast.    . meclizine  (ANTIVERT) 12.5 MG tablet Take 12.5 mg by mouth 2 (two) times daily.    . Multiple Vitamins-Minerals (CENTRUM SILVER ADULT 50+) TABS Take 1 tablet by mouth daily.    [name] Kitchen omeprazole (PRILOSEC) 40 MG capsule Take 40 mg by mouth 2 (two) times daily.    . pravastatin (PRAVACHOL) 40 MG tablet Take 40 mg by mouth at bedtime.    . traZODone (DESYREL) 50 MG tablet Take 50 mg by mouth at bedtime.    [name] Kitchen zolpidem (AMBIEN) 10 MG tablet Take 5 mg by mouth at bedtime as needed for sleep.      Results for orders placed or performed during the hospital encounter of 03/22/15 (from the past 48 hour(s))  Basic metabolic panel     Status: None   Collection Time: 03/22/15  9:06 PM  Result Value Ref Range   Sodium 137 135 - 145 mmol/L   Potassium 3.9 3.5 - 5.1 mmol/L   Chloride 101 101 - 111 mmol/L   CO2 27 22 - 32 mmol/L   Glucose, Bld 97 65 - 99 mg/dL   BUN 19 6 - 20 mg/dL   Creatinine, Ser 0.68 0.44 - 1.00 mg/dL   Calcium 9.0 8.9 - 10.3 mg/dL   GFR calc non Af Amer >60 >60 mL/min   GFR calc Af Amer >60 >60 mL/min    Comment: (NOTE)  The eGFR has been calculated using the CKD EPI equation. This calculation has not been validated in all clinical situations. eGFR's persistently <60 mL/min signify possible Chronic Kidney Disease.    Anion gap 9 5 - 15  CBC     Status: Abnormal   Collection Time: 03/22/15  9:06 PM  Result Value Ref Range   WBC 14.1 (H) 3.6 - 11.0 K/uL   RBC 4.33 3.80 - 5.20 MIL/uL   Hemoglobin 12.7 12.0 - 16.0 g/dL   HCT 38.2 35.0 - 47.0 %   MCV 88.2 80.0 - 100.0 fL   MCH 29.3 26.0 - 34.0 pg   MCHC 33.2 32.0 - 36.0 g/dL   RDW 14.4 11.5 - 14.5 %   Platelets 193 150 - 440 K/uL  TSH     Status: Abnormal   Collection Time: 03/22/15  9:06 PM  Result Value Ref Range   TSH 5.846 (H) 0.350 - 4.500 uIU/mL  Troponin I     Status: None   Collection Time: 03/22/15  9:06 PM  Result Value Ref Range   Troponin I <0.03 <0.031 ng/mL    Comment:        NO INDICATION OF MYOCARDIAL INJURY.    Urinalysis complete, with microscopic ([facility] only)     Status: Abnormal   Collection Time: 03/22/15 10:04 PM  Result Value Ref Range   Color, Urine YELLOW (A) YELLOW   APPearance CLEAR (A) CLEAR   Glucose, UA NEGATIVE NEGATIVE mg/dL   Bilirubin Urine NEGATIVE NEGATIVE   Ketones, ur TRACE (A) NEGATIVE mg/dL   Specific Gravity, Urine 1.008 1.005 - 1.030   Hgb urine dipstick NEGATIVE NEGATIVE   pH 6.0 5.0 - 8.0   Protein, ur NEGATIVE NEGATIVE mg/dL   Nitrite NEGATIVE NEGATIVE   Leukocytes, UA NEGATIVE NEGATIVE   RBC / HPF 0-5 0 - 5 RBC/hpf   WBC, UA 0-5 0 - 5 WBC/hpf   Bacteria, UA RARE (A) NONE SEEN   Squamous Epithelial / LPF 0-5 (A) NONE SEEN   Mucous PRESENT   Troponin I     Status: None   Collection Time: 03/23/15  4:13 AM  Result Value Ref Range   Troponin I 0.03 <0.031 ng/mL    Comment:        NO INDICATION OF MYOCARDIAL INJURY.    Dg Chest 2 View  03/22/2015   CLINICAL DATA:  Cough and weakness  EXAM: CHEST  2 VIEW  COMPARISON:  11/24/2014  FINDINGS: There is mild hyperinflation and borderline cardiomegaly, unchanged. There is a hiatal hernia. Mild chronic interstitial coarsening is present. There is no significant alveolar opacity. There is no effusion.  IMPRESSION: Hyperinflation, chronic interstitial coarsening, borderline cardiomegaly, hiatal hernia. No superimposed acute findings.   Electronically Signed   By: Andreas Newport M.D.   On: 03/22/2015 22:23   Ct Abdomen Pelvis W Contrast  03/22/2015   CLINICAL DATA:  Acute onset of lower abdominal pain. Chronic cough. Initial encounter.  EXAM: CT ABDOMEN AND PELVIS WITH CONTRAST  TECHNIQUE: Multidetector CT imaging of the abdomen and pelvis was performed using the standard protocol following bolus administration of intravenous contrast.  CONTRAST:  116mL OMNIPAQUE IOHEXOL 300 MG/ML  SOLN  COMPARISON:  MRI of the lumbar spine performed 07/10/2010, and CT of the abdomen and pelvis performed 07/21/2007  FINDINGS: Scattered tiny  nodules at the lung bases likely reflect remote granulomatous disease. Minimal left basilar scarring is noted. A moderate hiatal hernia is noted.  A calcified granuloma is noted  within the right hepatic lobe. The liver and spleen are otherwise unremarkable. The gallbladder is within normal limits. The pancreas and adrenal glands are unremarkable.  There is partial atrophy of the right kidney. A 2.6 cm cyst is noted at the medial aspect of the left kidney. The kidneys are otherwise unremarkable. There is no evidence of hydronephrosis. No renal or ureteral stones are seen. No perinephric stranding is appreciated.  No free fluid is identified. The small bowel is unremarkable in appearance. The stomach is within normal limits. No acute vascular abnormalities are seen.  The appendix is normal in caliber, without evidence of appendicitis. Scattered diverticulosis is noted along the entirety of the sigmoid colon.  Focal soft tissue inflammation is noted at the mid to distal sigmoid colon, with associated wall thickening and inflamed diverticula, compatible with acute diverticulitis. There is no definite evidence of perforation or abscess formation at this time.  The bladder is mildly distended and grossly unremarkable in appearance. The patient is status post hysterectomy. No suspicious adnexal masses are seen. A tiny left inguinal hernia is seen, containing only fat. No inguinal lymphadenopathy is seen.  No acute osseous abnormalities are identified. Mild degenerative change is noted at the pubic symphysis. Degenerative change is also seen along the posterior spinous processes of the lumbar spine. Multilevel vacuum phenomenon is noted along the lower thoracic spine, and there is grade 1 anterolisthesis of L4 on L5, reflecting underlying facet disease.  IMPRESSION: 1. Acute diverticulitis noted at the mid to distal sigmoid colon, with associated wall thickening and inflamed diverticula, and surrounding soft tissue  inflammation. No evidence of perforation or abscess formation at this time. 2. Scattered diverticulosis along the entirety of the sigmoid colon. 3. Moderate hiatal hernia noted. 4. Scattered tiny nodules at the lung bases likely reflect remote granulomatous disease. 5. Partial atrophy of the right kidney.  Left renal cyst noted. 6. Tiny left inguinal hernia, containing only fat. 7. Mild degenerative change along the lower thoracic and lower lumbar spine.   Electronically Signed   By: Garald Balding M.D.   On: 03/22/2015 23:14    Review of Systems  Constitutional: Negative for fever and chills.  HENT: Negative for sore throat and tinnitus.   Eyes: Negative for blurred vision and redness.  Respiratory: Positive for shortness of breath (Resolved). Negative for cough.   Cardiovascular: Positive for chest pain (resolved). Negative for palpitations, orthopnea and PND.  Gastrointestinal: Positive for abdominal pain. Negative for nausea, vomiting and diarrhea.  Genitourinary: Negative for dysuria, urgency and frequency.  Musculoskeletal: Positive for back pain. Negative for myalgias and joint pain.  Skin: Negative for rash.       No lesions  Neurological: Negative for speech change, focal weakness and weakness.  Endo/Heme/Allergies: Does not bruise/bleed easily.       No temperature intolerance  Psychiatric/Behavioral: Negative for depression and suicidal ideas.    Blood pressure 121/75, pulse 78, temperature 98.2 F (36.8 C), temperature source Oral, resp. rate 18, height $RemoveBe'5\' 4"'tqRmFOngS$  (1.626 m), weight 78.744 kg (173 lb 9.6 oz), SpO2 92 %. Physical Exam  Vitals reviewed. Constitutional: She is oriented to person, place, and time. She appears well-developed and well-nourished. No distress.  HENT:  Head: Normocephalic and atraumatic.  Mouth/Throat: Oropharynx is clear and moist.  Eyes: Conjunctivae and EOM are normal. Pupils are equal, round, and reactive to light. No scleral icterus.  Neck: Normal  range of motion. Neck supple. No JVD present. No tracheal deviation present. No thyromegaly present.  Cardiovascular: Normal rate, regular rhythm and normal heart sounds.  Exam reveals no gallop and no friction rub.   No murmur heard. Respiratory: Effort normal and breath sounds normal.  GI: Soft. Bowel sounds are normal. She exhibits no distension. There is no tenderness.  Genitourinary:  Deferred  Musculoskeletal: Normal range of motion. She exhibits no edema.  Lymphadenopathy:    She has no cervical adenopathy.  Neurological: She is alert and oriented to person, place, and time. No cranial nerve deficit. She exhibits normal muscle tone.  Skin: Skin is warm and dry. No rash noted. No erythema.  Psychiatric: She has a normal mood and affect. Her behavior is normal. Judgment and thought content normal.     Assessment/Plan This is a 81 year old Caucasian female admitted for diverticulitis. 1. Diverticulitis: Without perforation. Patient was started on Cipro and Flagyl in the emergency department. She received both IV but I believe her case to be mild at this time so we will continue with both medications orally. She has no signs or symptoms of peritonitis. No signs or symptoms of sepsis. 2. Hypothyroidism: Continue Synthroid 3. GERD: Continue PPI as needed 4. Hyperlipidemia: Continue statin therapy 5. Depression continue Celexa 6. DVT prophylaxis: Heparin 7. GI prophylaxis: None The patient is a full code. Time spent on admission orders and patient care approximately 35 minutes  Harrie Foreman 03/23/2015, 7:23 AM

## 2015-03-23 NOTE — Progress Notes (Signed)
[facility] [place] at [facility]                                                                                                                                                                                            Patient Demographics   Abigail Strong, is a 81 y.o. female, DOB - Dec 14, 1929, BZJ:[id number]  Admit date - 03/22/2015   Admitting Physician Arnaldo Natal, MD  Outpatient Primary MD for the patient is MASOUD,JAVED, MD   LOS - 0  Subjective: Patient reports that abdominal pain is improved. Denies any nausea vomiting or diarrhea     Review of Systems:   CONSTITUTIONAL: No documented fever. No fatigue, weakness. No weight gain, no weight loss.  EYES: No blurry or double vision.  ENT: No tinnitus. No postnasal drip. No redness of the oropharynx.  RESPIRATORY: No cough, no wheeze, no hemoptysis. No dyspnea.  CARDIOVASCULAR: No chest pain. No orthopnea. No palpitations. No syncope.  GASTROINTESTINAL: No nausea, no vomiting or diarrhea. Positive abdominal pain. No melena or hematochezia.  GENITOURINARY: No dysuria or hematuria.  ENDOCRINE: No polyuria or nocturia. No heat or cold intolerance.  HEMATOLOGY: No anemia. No bruising. No bleeding.  INTEGUMENTARY: No rashes. No lesions.  MUSCULOSKELETAL: No arthritis. No swelling. No gout.  NEUROLOGIC: No numbness, tingling, or ataxia. No seizure-type activity.  PSYCHIATRIC: No anxiety. No insomnia. No ADD.    Vitals:   Filed Vitals:   03/23/15 0224 03/23/15 0244 03/23/15 0329 03/23/15 0747  BP:  121/75  100/61  Pulse:  78  78  Temp:  98.2 F (36.8 C)  98.6 F (37 C)  TempSrc:  Oral  Oral  Resp:  28 18 16   Height: 5\' 4"  (1.626 m)     Weight: 78.744 kg (173 lb 9.6 oz)     SpO2:  95% 92% 96%    Wt Readings from Last 3 Encounters:  03/23/15 78.744 kg (173 lb 9.6 oz)  02/06/10 80.287 kg (177 lb)  12/08/09 79.552 kg (175 lb 6.1 oz)     Intake/Output Summary (Last 24 hours) at  03/23/15 1230 Last data filed at 03/23/15 1140  Gross per 24 hour  Intake   37.8 ml  Output    750 ml  Net -712.2 ml    Physical Exam:   GENERAL: Pleasant-appearing in no apparent distress.  HEAD, EYES, EARS, NOSE AND THROAT: Atraumatic, normocephalic. Extraocular muscles are intact. Pupils equal and reactive to light. Sclerae anicteric. No conjunctival injection. No oro-pharyngeal erythema.  NECK: Supple. There is no jugular venous distention. No bruits, no lymphadenopathy, no thyromegaly.  HEART: Regular rate and  rhythm,. No murmurs, no rubs, no clicks.  LUNGS: Clear to auscultation bilaterally. No rales or rhonchi. No wheezes.  ABDOMEN: Soft, flat, nontender, nondistended. Has good bowel sounds. No hepatosplenomegaly appreciated. Left lower quadrant tenderness EXTREMITIES: No evidence of any cyanosis, clubbing, or peripheral edema.  +2 pedal and radial pulses bilaterally.  NEUROLOGIC: The patient is alert, awake, and oriented x3 with no focal motor or sensory deficits appreciated bilaterally.  SKIN: Moist and warm with no rashes appreciated.  Psych: Not anxious, depressed LN: No inguinal LN enlargement    Antibiotics   Anti-infectives    Start     Dose/Rate Route Frequency Ordered Stop   03/23/15 0800  ciprofloxacin (CIPRO) tablet 750 mg     750 mg Oral 2 times daily 03/23/15 0245     03/23/15 0600  metroNIDAZOLE (FLAGYL) tablet 500 mg     500 mg Oral 4 times per day 03/23/15 0245     03/23/15 0015  ciprofloxacin (CIPRO) IVPB 400 mg     400 mg 200 mL/hr over 60 Minutes Intravenous  Once 03/23/15 0012 03/23/15 0202   03/23/15 0015  metroNIDAZOLE (FLAGYL) IVPB 500 mg     500 mg 100 mL/hr over 60 Minutes Intravenous  Once 03/23/15 0012 03/23/15 0315      Medications   Scheduled Meds: . aspirin EC  81 mg Oral Daily  . cholecalciferol  5,000 Units Oral Daily  . ciprofloxacin  750 mg Oral BID  . citalopram  20 mg Oral Daily  . docusate sodium  100 mg Oral BID  . heparin   5,000 Units Subcutaneous 3 times per day  . levothyroxine  125 mcg Oral QAC breakfast  . meclizine  12.5 mg Oral BID  . metroNIDAZOLE  500 mg Oral 4 times per day  . multivitamin with minerals  1 tablet Oral Daily  . pravastatin  40 mg Oral QHS  . traZODone  50 mg Oral QHS  . vitamin C  1,000 mg Oral Daily   Continuous Infusions: . sodium chloride 100 mL/hr at 03/23/15 0324   PRN Meds:.morphine injection, ondansetron **OR** ondansetron (ZOFRAN) IV, zolpidem   Data Review:   Micro Results Recent Results (from the past 240 hour(s))  Urine culture     Status: None (Preliminary result)   Collection Time: 03/22/15 10:04 PM  Result Value Ref Range Status   Specimen Description URINE, RANDOM  Final   Special Requests NONE  Final   Culture NO GROWTH < 12 HOURS  Final   Report Status PENDING  Incomplete    Radiology Reports Dg Chest 2 View  03/22/2015   CLINICAL DATA:  Cough and weakness  EXAM: CHEST  2 VIEW  COMPARISON:  11/24/2014  FINDINGS: There is mild hyperinflation and borderline cardiomegaly, unchanged. There is a hiatal hernia. Mild chronic interstitial coarsening is present. There is no significant alveolar opacity. There is no effusion.  IMPRESSION: Hyperinflation, chronic interstitial coarsening, borderline cardiomegaly, hiatal hernia. No superimposed acute findings.   Electronically Signed   By: Ellery Plunk M.D.   On: 03/22/2015 22:23   Ct Chest Wo Contrast  02/22/2015   CLINICAL DATA:  81 year old female with right lung pulmonary nodules. Intermittent cough for 1 year. Subsequent encounter.  EXAM: CT CHEST WITHOUT CONTRAST  TECHNIQUE: Multidetector CT imaging of the chest was performed following the standard protocol without IV contrast.  COMPARISON:  Chest CT 11/03/2014, 04/24/2007. CT Abdomen and Pelvis 07/21/2007.  FINDINGS: Increased respiratory motion artifact today. Stable large lung volumes with  increased AP dimension to the chest. The lateral basal segment right  lower lobe 4 mm nodule is stable (series 3, image 27). There also is an nearby stable 2-3 mm right middle lobe nodule on image 28. There is also a stable small 3-4 mm posterior right upper lobe nodule on image 22. The anterior right upper lobe nodule seen in March appears resolved although there is increased respiratory motion artifact here today.  Major airways remain patent. No pleural effusion. No new pulmonary opacity.  No pericardial effusion. Coronary artery calcified atherosclerosis. Moderate size gastric hiatal hernia again noted. Calcified atherosclerosis of the aorta. No axillary lymphadenopathy. No mediastinal lymphadenopathy.  Negative noncontrast visualized liver, gallbladder, spleen, pancreas, and adrenal glands. Negative visible small and large bowel loops in the upper abdomen. Visualized kidneys appear stable compared to the prior CT Abdomen and Pelvis.  Chronic sternal fracture Re identified. Flowing osteophytes in the thoracic spine with some levels of interbody ankylosis. No acute osseous abnormality identified.  IMPRESSION: 1. Stable up to 4 mm right lung pulmonary nodules since March. If the patient is at low risk for lung cancer then no additional follow-up is needed. Otherwise a repeat chest CT in March 2017 would be necessary to fulfill the consensus criteria: Guidelines for Management of Small Pulmonary Nodules Detected on CT Scans: A Statement from the Fleischner Society as published in Radiology 2005; 237:395-400. 2. No acute findings in the chest.   Electronically Signed   By: Odessa Fleming M.D.   On: 02/22/2015 09:59   Ct Abdomen Pelvis W Contrast  03/22/2015   CLINICAL DATA:  Acute onset of lower abdominal pain. Chronic cough. Initial encounter.  EXAM: CT ABDOMEN AND PELVIS WITH CONTRAST  TECHNIQUE: Multidetector CT imaging of the abdomen and pelvis was performed using the standard protocol following bolus administration of intravenous contrast.  CONTRAST:  OMNIPAQUE IOHEXOL 300 MG/ML   SOLN  COMPARISON:  MRI of the lumbar spine performed 07/10/2010, and CT of the abdomen and pelvis performed 07/21/2007  FINDINGS: Scattered tiny nodules at the lung bases likely reflect remote granulomatous disease. Minimal left basilar scarring is noted. A moderate hiatal hernia is noted.  A calcified granuloma is noted within the right hepatic lobe. The liver and spleen are otherwise unremarkable. The gallbladder is within normal limits. The pancreas and adrenal glands are unremarkable.  There is partial atrophy of the right kidney. A 2.6 cm cyst is noted at the medial aspect of the left kidney. The kidneys are otherwise unremarkable. There is no evidence of hydronephrosis. No renal or ureteral stones are seen. No perinephric stranding is appreciated.  No free fluid is identified. The small bowel is unremarkable in appearance. The stomach is within normal limits. No acute vascular abnormalities are seen.  The appendix is normal in caliber, without evidence of appendicitis. Scattered diverticulosis is noted along the entirety of the sigmoid colon.  Focal soft tissue inflammation is noted at the mid to distal sigmoid colon, with associated wall thickening and inflamed diverticula, compatible with acute diverticulitis. There is no definite evidence of perforation or abscess formation at this time.  The bladder is mildly distended and grossly unremarkable in appearance. The patient is status post hysterectomy. No suspicious adnexal masses are seen. A tiny left inguinal hernia is seen, containing only fat. No inguinal lymphadenopathy is seen.  No acute osseous abnormalities are identified. Mild degenerative change is noted at the pubic symphysis. Degenerative change is also seen along the posterior spinous processes of the lumbar  spine. Multilevel vacuum phenomenon is noted along the lower thoracic spine, and there is grade 1 anterolisthesis of L4 on L5, reflecting underlying facet disease.  IMPRESSION: 1. Acute  diverticulitis noted at the mid to distal sigmoid colon, with associated wall thickening and inflamed diverticula, and surrounding soft tissue inflammation. No evidence of perforation or abscess formation at this time. 2. Scattered diverticulosis along the entirety of the sigmoid colon. 3. Moderate hiatal hernia noted. 4. Scattered tiny nodules at the lung bases likely reflect remote granulomatous disease. 5. Partial atrophy of the right kidney.  Left renal cyst noted. 6. Tiny left inguinal hernia, containing only fat. 7. Mild degenerative change along the lower thoracic and lower lumbar spine.   Electronically Signed   By: Roanna Raider M.D.   On: 03/22/2015 23:14     CBC  Recent Labs Lab 03/22/15 2106  WBC 14.1*  HGB 12.7  HCT 38.2  PLT 193  MCV 88.2  MCH 29.3  MCHC 33.2  RDW 14.4    Chemistries   Recent Labs Lab 03/22/15 2106  NA 137  K 3.9  CL 101  CO2 27  GLUCOSE 97  BUN 19  CREATININE 0.68  CALCIUM 9.0   ------------------------------------------------------------------------------------------------------------------ estimated creatinine clearance is 55 mL/min (by C-G formula based on Cr of 0.68). ------------------------------------------------------------------------------------------------------------------ No results for input(s): HGBA1C in the last 72 hours. ------------------------------------------------------------------------------------------------------------------ No results for input(s): CHOL, HDL, LDLCALC, TRIG, CHOLHDL, LDLDIRECT in the last 72 hours. ------------------------------------------------------------------------------------------------------------------  Recent Labs  03/22/15 2106  TSH 5.846*   ------------------------------------------------------------------------------------------------------------------ No results for input(s): VITAMINB12, FOLATE, FERRITIN, TIBC, IRON, RETICCTPCT in the last 72 hours.  Coagulation profile No  results for input(s): INR, PROTIME in the last 168 hours.  No results for input(s): DDIMER in the last 72 hours.  Cardiac Enzymes  Recent Labs Lab 03/22/15 2106 03/23/15 0413  TROPONINI <0.03 0.03   ------------------------------------------------------------------------------------------------------------------ Invalid input(s): POCBNP    Assessment & Plan   This is a 81 year old Caucasian female admitted for diverticulitis. 1. Diverticulitis: Change anabiotic's to IV Cipro and IV Flagyl 2. Hypothyroidism: Continue Synthroid 3. GERD: Continue PPI as needed 4. Hyperlipidemia: Continue Pravachol 5. Depression continue Celexa 6. DVT prophylaxis: Heparin 7. GI prophylaxis: None      Code Status Orders        Start     Ordered   03/23/15 0246  Full code   Continuous     03/23/15 0245           Consults  none   DVT Prophylaxis  heparin  Lab Results  Component Value Date   PLT 193 03/22/2015     Time Spent in minutes   45 minutes    Auburn Bilberry M.D on 03/23/2015 at 12:30 PM  Between 7am to 6pm - Pager - [phone]  After 6pm go to [email or web address] - password [email or web address] [facility]  [facility]   Office  [phone]

## 2015-03-24 LAB — URINE CULTURE: Culture: NO GROWTH

## 2015-03-24 NOTE — Progress Notes (Signed)
[facility] [place] at [facility]                                                                                                                                                                                            Patient Demographics   Abigail Strong, is a 81 y.o. female, DOB - 1930/08/11, UUV:[id number]  Admit date - 03/22/2015   Admitting Physician Arnaldo Natal, MD  Outpatient Primary MD for the patient is MASOUD,JAVED, MD   LOS - 1  Subjective: Abdominal pain continues to improve.   Review of Systems:   CONSTITUTIONAL: No documented fever. No fatigue, weakness. No weight gain, no weight loss.  EYES: No blurry or double vision.  ENT: No tinnitus. No postnasal drip. No redness of the oropharynx.  RESPIRATORY: No cough, no wheeze, no hemoptysis. No dyspnea.  CARDIOVASCULAR: No chest pain. No orthopnea. No palpitations. No syncope.  GASTROINTESTINAL: No nausea, no vomiting or diarrhea. Positive abdominal pain. No melena or hematochezia.  GENITOURINARY: No dysuria or hematuria.  ENDOCRINE: No polyuria or nocturia. No heat or cold intolerance.  HEMATOLOGY: No anemia. No bruising. No bleeding.  INTEGUMENTARY: No rashes. No lesions.  MUSCULOSKELETAL: No arthritis. No swelling. No gout.  NEUROLOGIC: No numbness, tingling, or ataxia. No seizure-type activity.  PSYCHIATRIC: No anxiety. No insomnia. No ADD.    Vitals:   Filed Vitals:   03/23/15 1546 03/24/15 0007 03/24/15 0500 03/24/15 0744  BP: 114/57 113/61  108/74  Pulse: 65 76  64  Temp: 98 F (36.7 C) 98.3 F (36.8 C)  97.7 F (36.5 C)  TempSrc: Oral Oral  Oral  Resp: 16 18  18   Height:      Weight:   80.876 kg (178 lb 4.8 oz)   SpO2: 91% 94%  91%    Wt Readings from Last 3 Encounters:  03/24/15 80.876 kg (178 lb 4.8 oz)  02/06/10 80.287 kg (177 lb)  12/08/09 79.552 kg (175 lb 6.1 oz)     Intake/Output Summary (Last 24 hours) at 03/24/15 1142 Last data filed at 03/24/15  0807  Gross per 24 hour  Intake 2896.4 ml  Output   3500 ml  Net -603.6 ml    Physical Exam:   GENERAL: Pleasant-appearing in no apparent distress.  HEAD, EYES, EARS, NOSE AND THROAT: Atraumatic, normocephalic. Extraocular muscles are intact. Pupils equal and reactive to light. Sclerae anicteric. No conjunctival injection. No oro-pharyngeal erythema.  NECK: Supple. There is no jugular venous distention. No bruits, no lymphadenopathy, no thyromegaly.  HEART: Regular rate and rhythm,. No murmurs, no rubs, no clicks.  LUNGS: Clear to auscultation bilaterally.  No rales or rhonchi. No wheezes.  ABDOMEN: Soft, flat, nontender, nondistended. Has good bowel sounds. No hepatosplenomegaly appreciated. Left lower quadrant tenderness EXTREMITIES: No evidence of any cyanosis, clubbing, or peripheral edema.  +2 pedal and radial pulses bilaterally.  NEUROLOGIC: The patient is alert, awake, and oriented x3 with no focal motor or sensory deficits appreciated bilaterally.  SKIN: Moist and warm with no rashes appreciated.  Psych: Not anxious, depressed LN: No inguinal LN enlargement    Antibiotics   Anti-infectives    Start     Dose/Rate Route Frequency Ordered Stop   03/23/15 1400  metroNIDAZOLE (FLAGYL) IVPB 500 mg     500 mg 100 mL/hr over 60 Minutes Intravenous Every 8 hours 03/23/15 1233     03/23/15 1300  ciprofloxacin (CIPRO) IVPB 400 mg     400 mg 200 mL/hr over 60 Minutes Intravenous Every 12 hours 03/23/15 1233     03/23/15 0800  ciprofloxacin (CIPRO) tablet 750 mg  Status:  Discontinued     750 mg Oral 2 times daily 03/23/15 0245 03/23/15 1233   03/23/15 0600  metroNIDAZOLE (FLAGYL) tablet 500 mg  Status:  Discontinued     500 mg Oral 4 times per day 03/23/15 0245 03/23/15 1233   03/23/15 0015  ciprofloxacin (CIPRO) IVPB 400 mg     400 mg 200 mL/hr over 60 Minutes Intravenous  Once 03/23/15 0012 03/23/15 0202   03/23/15 0015  metroNIDAZOLE (FLAGYL) IVPB 500 mg     500 mg 100 mL/hr  over 60 Minutes Intravenous  Once 03/23/15 0012 03/23/15 0315      Medications   Scheduled Meds: . aspirin EC  81 mg Oral Daily  . cholecalciferol  5,000 Units Oral Daily  . ciprofloxacin  400 mg Intravenous Q12H  . citalopram  20 mg Oral Daily  . docusate sodium  100 mg Oral BID  . heparin  5,000 Units Subcutaneous 3 times per day  . levothyroxine  125 mcg Oral QAC breakfast  . meclizine  12.5 mg Oral BID  . metronidazole  500 mg Intravenous Q8H  . multivitamin with minerals  1 tablet Oral Daily  . pravastatin  40 mg Oral QHS  . traZODone  50 mg Oral QHS  . vitamin C  1,000 mg Oral Daily   Continuous Infusions: . sodium chloride 100 mL/hr at 03/24/15 0832   PRN Meds:.morphine injection, ondansetron **OR** ondansetron (ZOFRAN) IV, zolpidem   Data Review:   Micro Results Recent Results (from the past 240 hour(s))  Urine culture     Status: None   Collection Time: 03/22/15 10:04 PM  Result Value Ref Range Status   Specimen Description URINE, RANDOM  Final   Special Requests NONE  Final   Culture NO GROWTH 2 DAYS  Final   Report Status 03/24/2015 FINAL  Final    Radiology Reports Dg Chest 2 View  03/22/2015   CLINICAL DATA:  Cough and weakness  EXAM: CHEST  2 VIEW  COMPARISON:  11/24/2014  FINDINGS: There is mild hyperinflation and borderline cardiomegaly, unchanged. There is a hiatal hernia. Mild chronic interstitial coarsening is present. There is no significant alveolar opacity. There is no effusion.  IMPRESSION: Hyperinflation, chronic interstitial coarsening, borderline cardiomegaly, hiatal hernia. No superimposed acute findings.   Electronically Signed   By: Ellery Plunk M.D.   On: 03/22/2015 22:23   Ct Abdomen Pelvis W Contrast  03/22/2015   CLINICAL DATA:  Acute onset of lower abdominal pain. Chronic cough. Initial encounter.  EXAM:  CT ABDOMEN AND PELVIS WITH CONTRAST  TECHNIQUE: Multidetector CT imaging of the abdomen and pelvis was performed using the standard  protocol following bolus administration of intravenous contrast.  CONTRAST:  OMNIPAQUE IOHEXOL 300 MG/ML  SOLN  COMPARISON:  MRI of the lumbar spine performed 07/10/2010, and CT of the abdomen and pelvis performed 07/21/2007  FINDINGS: Scattered tiny nodules at the lung bases likely reflect remote granulomatous disease. Minimal left basilar scarring is noted. A moderate hiatal hernia is noted.  A calcified granuloma is noted within the right hepatic lobe. The liver and spleen are otherwise unremarkable. The gallbladder is within normal limits. The pancreas and adrenal glands are unremarkable.  There is partial atrophy of the right kidney. A 2.6 cm cyst is noted at the medial aspect of the left kidney. The kidneys are otherwise unremarkable. There is no evidence of hydronephrosis. No renal or ureteral stones are seen. No perinephric stranding is appreciated.  No free fluid is identified. The small bowel is unremarkable in appearance. The stomach is within normal limits. No acute vascular abnormalities are seen.  The appendix is normal in caliber, without evidence of appendicitis. Scattered diverticulosis is noted along the entirety of the sigmoid colon.  Focal soft tissue inflammation is noted at the mid to distal sigmoid colon, with associated wall thickening and inflamed diverticula, compatible with acute diverticulitis. There is no definite evidence of perforation or abscess formation at this time.  The bladder is mildly distended and grossly unremarkable in appearance. The patient is status post hysterectomy. No suspicious adnexal masses are seen. A tiny left inguinal hernia is seen, containing only fat. No inguinal lymphadenopathy is seen.  No acute osseous abnormalities are identified. Mild degenerative change is noted at the pubic symphysis. Degenerative change is also seen along the posterior spinous processes of the lumbar spine. Multilevel vacuum phenomenon is noted along the lower thoracic spine, and  there is grade 1 anterolisthesis of L4 on L5, reflecting underlying facet disease.  IMPRESSION: 1. Acute diverticulitis noted at the mid to distal sigmoid colon, with associated wall thickening and inflamed diverticula, and surrounding soft tissue inflammation. No evidence of perforation or abscess formation at this time. 2. Scattered diverticulosis along the entirety of the sigmoid colon. 3. Moderate hiatal hernia noted. 4. Scattered tiny nodules at the lung bases likely reflect remote granulomatous disease. 5. Partial atrophy of the right kidney.  Left renal cyst noted. 6. Tiny left inguinal hernia, containing only fat. 7. Mild degenerative change along the lower thoracic and lower lumbar spine.   Electronically Signed   By: Roanna Raider M.D.   On: 03/22/2015 23:14     CBC  Recent Labs Lab 03/22/15 2106  WBC 14.1*  HGB 12.7  HCT 38.2  PLT 193  MCV 88.2  MCH 29.3  MCHC 33.2  RDW 14.4    Chemistries   Recent Labs Lab 03/22/15 2106  NA 137  K 3.9  CL 101  CO2 27  GLUCOSE 97  BUN 19  CREATININE 0.68  CALCIUM 9.0   ------------------------------------------------------------------------------------------------------------------ estimated creatinine clearance is 55.8 mL/min (by C-G formula based on Cr of 0.68). ------------------------------------------------------------------------------------------------------------------  Recent Labs  03/23/15 0413  HGBA1C 5.8   ------------------------------------------------------------------------------------------------------------------ No results for input(s): CHOL, HDL, LDLCALC, TRIG, CHOLHDL, LDLDIRECT in the last 72 hours. ------------------------------------------------------------------------------------------------------------------  Recent Labs  03/22/15 2106  TSH 5.846*   ------------------------------------------------------------------------------------------------------------------ No results for input(s):  VITAMINB12, FOLATE, FERRITIN, TIBC, IRON, RETICCTPCT in the last 72 hours.  Coagulation profile No results for  input(s): INR, PROTIME in the last 168 hours.  No results for input(s): DDIMER in the last 72 hours.  Cardiac Enzymes  Recent Labs Lab 03/22/15 2106 03/23/15 0413  TROPONINI <0.03 0.03   ------------------------------------------------------------------------------------------------------------------ Invalid input(s): POCBNP    Assessment & Plan   This is a 81 year old Caucasian female admitted for diverticulitis. 1. Diverticulitis: Continue IV Cipro and IV Flagyl one more day, switch to oral tomorrow and likely discharge home 2. Hypothyroidism: Continue Synthroid 3. GERD: Continue PPI as needed 4. Hyperlipidemia: Continue Pravachol 5. Depression continue Celexa 6. DVT prophylaxis: Heparin 7. GI prophylaxis: None      Code Status Orders        Start     Ordered   03/23/15 0246  Full code   Continuous     03/23/15 0245           Consults  none   DVT Prophylaxis  heparin  Lab Results  Component Value Date   PLT 193 03/22/2015     Time Spent in minutes   35 minutes    Auburn Bilberry M.D on 03/24/2015 at 11:42 AM  Between 7am to 6pm - Pager - [phone]  After 6pm go to [email or web address] - password [email or web address] [facility]  [facility] [place] [facility]   Office  [phone]

## 2015-03-25 LAB — CBC
HCT: 38.6 % (ref 35.0–47.0)
Hemoglobin: 12.9 g/dL (ref 12.0–16.0)
MCH: 29.6 pg (ref 26.0–34.0)
MCHC: 33.5 g/dL (ref 32.0–36.0)
MCV: 88.4 fL (ref 80.0–100.0)
Platelets: 212 10*3/uL (ref 150–440)
RBC: 4.37 MIL/uL (ref 3.80–5.20)
RDW: 14.1 % (ref 11.5–14.5)
WBC: 6.4 10*3/uL (ref 3.6–11.0)

## 2015-03-25 MED ORDER — METRONIDAZOLE 500 MG PO TABS: 500.0000 mg | ORAL_TABLET | Freq: Three times a day (TID) | ORAL | Status: AC

## 2015-03-25 MED ORDER — CIPROFLOXACIN HCL 500 MG PO TABS: 500.0000 mg | ORAL_TABLET | Freq: Two times a day (BID) | ORAL | Status: AC

## 2015-03-25 NOTE — Discharge Instructions (Signed)
°  DIET:  Low residue diet  DISCHARGE CONDITION:  Stable  ACTIVITY:  Activity as tolerated  OXYGEN:  Home Oxygen: No.   Oxygen Delivery: room air  DISCHARGE LOCATION:  home    ADDITIONAL DISCHARGE INSTRUCTION:   If you experience worsening of your admission symptoms, develop shortness of breath, life threatening emergency, suicidal or homicidal thoughts you must seek medical attention immediately by calling 911 or calling your MD immediately  if symptoms less severe.  You Must read complete instructions/literature along with all the possible adverse reactions/side effects for all the Medicines you take and that have been prescribed to you. Take any new Medicines after you have completely understood and accpet all the possible adverse reactions/side effects.   Please note  You were cared for by a hospitalist during your hospital stay. If you have any questions about your discharge medications or the care you received while you were in the hospital after you are discharged, you can call the unit and asked to speak with the hospitalist on call if the hospitalist that took care of you is not available. Once you are discharged, your primary care physician will handle any further medical issues. Please note that NO REFILLS for any discharge medications will be authorized once you are discharged, as it is imperative that you return to your primary care physician (or establish a relationship with a primary care physician if you do not have one) for your aftercare needs so that they can reassess your need for medications and monitor your lab values.

## 2015-03-25 NOTE — Care Management Important Message (Signed)
Important Message  Patient Details  Name: Abigail Strong MRN: [id number] Date of Birth: 1930/07/09   Medicare Important Message Given:  Yes-second notification given    Darius Bump Allmond 03/25/2015, 9:29 AM

## 2015-03-25 NOTE — Progress Notes (Addendum)
[place] at [place] was admitted to the Hospital on 03/22/2015 and Discharged  03/25/2015 and should be excused from work/school   for 3 days starting 03/22/2015 , may return to work/school without any restrictions.  Call Dustin Flock MD with questions.  Dustin Flock M.D on 03/25/2015,at 8:35 AM  [place] at [facility]  [phone]

## 2015-03-25 NOTE — Progress Notes (Signed)
Pt stable. Pt d/c instructions given and education provided. Prescriptions given. Questions answered. IV removed. Pt requesting to shower with help of NA. Will be escorted out by staff and driven home by family.

## 2015-03-25 NOTE — Discharge Summary (Signed)
Abigail Strong, 81 y.o., DOB 1929/10/24, MRN [id number]. Admission date: 03/22/2015 Discharge Date 03/25/2015 Primary MD Corky Downs, MD Admitting Physician Arnaldo Natal, MD  Admission Diagnosis  Weakness [R53.1] Lower abdominal pain [R10.30] Diverticulitis of large intestine without perforation or abscess without bleeding [K57.32]  Discharge Diagnosis   Active Problems:   Diverticulitis  GERD Hypothyroidism Insomnia Depression Hyperlipidemia       Hospital Course patient is a 80 year old white female presented to the emergency room with abdominal pain for 2 days' duration. Patient had a CT scan of the abdomen and pelvis which showed acute diverticulitis. Without perforation her symptoms were significant therefore she was admitted to the hospital started on IV anabiotic's. Patient said abdominal pain slowly improved currently she is pain-free. Denies nausea started throwing up. Her WBC count is normalized. She is stable for discharge on oral anabiotic's.             Consults  None  Significant Tests:  See full reports for all details    Dg Chest 2 View  03/22/2015   CLINICAL DATA:  Cough and weakness  EXAM: CHEST  2 VIEW  COMPARISON:  11/24/2014  FINDINGS: There is mild hyperinflation and borderline cardiomegaly, unchanged. There is a hiatal hernia. Mild chronic interstitial coarsening is present. There is no significant alveolar opacity. There is no effusion.  IMPRESSION: Hyperinflation, chronic interstitial coarsening, borderline cardiomegaly, hiatal hernia. No superimposed acute findings.   Electronically Signed   By: Ellery Plunk M.D.   On: 03/22/2015 22:23   Ct Abdomen Pelvis W Contrast  03/22/2015   CLINICAL DATA:  Acute onset of lower abdominal pain. Chronic cough. Initial encounter.  EXAM: CT ABDOMEN AND PELVIS WITH CONTRAST  TECHNIQUE: Multidetector CT imaging of the abdomen and pelvis was performed using the standard protocol following bolus administration  of intravenous contrast.  CONTRAST:  OMNIPAQUE IOHEXOL 300 MG/ML  SOLN  COMPARISON:  MRI of the lumbar spine performed 07/10/2010, and CT of the abdomen and pelvis performed 07/21/2007  FINDINGS: Scattered tiny nodules at the lung bases likely reflect remote granulomatous disease. Minimal left basilar scarring is noted. A moderate hiatal hernia is noted.  A calcified granuloma is noted within the right hepatic lobe. The liver and spleen are otherwise unremarkable. The gallbladder is within normal limits. The pancreas and adrenal glands are unremarkable.  There is partial atrophy of the right kidney. A 2.6 cm cyst is noted at the medial aspect of the left kidney. The kidneys are otherwise unremarkable. There is no evidence of hydronephrosis. No renal or ureteral stones are seen. No perinephric stranding is appreciated.  No free fluid is identified. The small bowel is unremarkable in appearance. The stomach is within normal limits. No acute vascular abnormalities are seen.  The appendix is normal in caliber, without evidence of appendicitis. Scattered diverticulosis is noted along the entirety of the sigmoid colon.  Focal soft tissue inflammation is noted at the mid to distal sigmoid colon, with associated wall thickening and inflamed diverticula, compatible with acute diverticulitis. There is no definite evidence of perforation or abscess formation at this time.  The bladder is mildly distended and grossly unremarkable in appearance. The patient is status post hysterectomy. No suspicious adnexal masses are seen. A tiny left inguinal hernia is seen, containing only fat. No inguinal lymphadenopathy is seen.  No acute osseous abnormalities are identified. Mild degenerative change is noted at the pubic symphysis. Degenerative change is also seen along the posterior spinous processes of the lumbar spine.  Multilevel vacuum phenomenon is noted along the lower thoracic spine, and there is grade 1 anterolisthesis of L4  on L5, reflecting underlying facet disease.  IMPRESSION: 1. Acute diverticulitis noted at the mid to distal sigmoid colon, with associated wall thickening and inflamed diverticula, and surrounding soft tissue inflammation. No evidence of perforation or abscess formation at this time. 2. Scattered diverticulosis along the entirety of the sigmoid colon. 3. Moderate hiatal hernia noted. 4. Scattered tiny nodules at the lung bases likely reflect remote granulomatous disease. 5. Partial atrophy of the right kidney.  Left renal cyst noted. 6. Tiny left inguinal hernia, containing only fat. 7. Mild degenerative change along the lower thoracic and lower lumbar spine.   Electronically Signed   By: Roanna Raider M.D.   On: 03/22/2015 23:14       Today   Subjective:   Abigail Strong feels much better abdominal pain resolved  Objective:   Blood pressure 130/72, pulse 63, temperature 97.6 F (36.4 C), temperature source Oral, resp. rate 19, height 5\' 4"  (1.626 m), weight 79.697 kg (175 lb 11.2 oz), SpO2 89 %.  .  Intake/Output Summary (Last 24 hours) at 03/25/15 1202 Last data filed at 03/25/15 0748  Gross per 24 hour  Intake 3714.37 ml  Output    450 ml  Net 3264.37 ml    Exam VITAL SIGNS: Blood pressure 130/72, pulse 63, temperature 97.6 F (36.4 C), temperature source Oral, resp. rate 19, height 5\' 4"  (1.626 m), weight 79.697 kg (175 lb 11.2 oz), SpO2 89 %.  GENERAL:  81 y.o.-year-old patient lying in the bed with no acute distress.  EYES: Pupils equal, round, reactive to light and accommodation. No scleral icterus. Extraocular muscles intact.  HEENT: Head atraumatic, normocephalic. Oropharynx and nasopharynx clear.  NECK:  Supple, no jugular venous distention. No thyroid enlargement, no tenderness.  LUNGS: Normal breath sounds bilaterally, no wheezing, rales,rhonchi or crepitation. No use of accessory muscles of respiration.  CARDIOVASCULAR: S1, S2 normal. No murmurs, rubs, or gallops.   ABDOMEN: Soft, nontender, nondistended. Bowel sounds present. No organomegaly or mass.  EXTREMITIES: No pedal edema, cyanosis, or clubbing.  NEUROLOGIC: Cranial nerves II through XII are intact. Muscle strength 5/5 in all extremities. Sensation intact. Gait not checked.  PSYCHIATRIC: The patient is alert and oriented x 3.  SKIN: No obvious rash, lesion, or ulcer.   Data Review     CBC w Diff: Lab Results  Component Value Date   WBC 6.4 03/25/2015   HGB 12.9 03/25/2015   HCT 38.6 03/25/2015   PLT 212 03/25/2015   CMP: Lab Results  Component Value Date   NA 137 03/22/2015   K 3.9 03/22/2015   CL 101 03/22/2015   CO2 27 03/22/2015   BUN 19 03/22/2015   CREATININE 0.68 03/22/2015  .  Micro Results Recent Results (from the past 240 hour(s))  Urine culture     Status: None   Collection Time: 03/22/15 10:04 PM  Result Value Ref Range Status   Specimen Description URINE, RANDOM  Final   Special Requests NONE  Final   Culture NO GROWTH 2 DAYS  Final   Report Status 03/24/2015 FINAL  Final        Code Status Orders        Start     Ordered   03/23/15 0246  Full code   Continuous     03/23/15 0245          Follow-up Information    Follow  up with MASOUD,JAVED, MD In 7 days.   Specialty:  Internal Medicine   Why:  You have a follow up with Dr. Juel Burrow 04/05/15 at 10:00am.   Contact information:   [address] [place] [address] [phone]       Discharge Medications     Medication List    TAKE these medications        acetaminophen 500 MG tablet  Commonly known as:  TYLENOL  Take 1,000 mg by mouth every 6 (six) hours as needed.     aspirin EC 81 MG tablet  Take 81 mg by mouth daily.     CENTRUM SILVER ADULT 50+ Tabs  Take 1 tablet by mouth daily.     ciprofloxacin 500 MG tablet  Commonly known as:  CIPRO  Take 1 tablet (500 mg total) by mouth 2 (two) times daily.     citalopram 20 MG tablet  Commonly known as:  CELEXA  Take 20 mg by  mouth daily.     Ginkgo Biloba 120 MG Caps  Take 120 mg by mouth daily.     levothyroxine 125 MCG tablet  Commonly known as:  SYNTHROID, LEVOTHROID  Take 125 mcg by mouth daily before breakfast.     meclizine 12.5 MG tablet  Commonly known as:  ANTIVERT  Take 12.5 mg by mouth 2 (two) times daily.     metroNIDAZOLE 500 MG tablet  Commonly known as:  FLAGYL  Take 1 tablet (500 mg total) by mouth 3 (three) times daily.     omeprazole 40 MG capsule  Commonly known as:  PRILOSEC  Take 40 mg by mouth 2 (two) times daily.     pravastatin 40 MG tablet  Commonly known as:  PRAVACHOL  Take 40 mg by mouth at bedtime.     traZODone 50 MG tablet  Commonly known as:  DESYREL  Take 50 mg by mouth at bedtime.     vitamin C 1000 MG tablet  Take 1,000 mg by mouth daily.     Vitamin D3 5000 UNITS Caps  Take 5,000 Units by mouth daily.     zolpidem 10 MG tablet  Commonly known as:  AMBIEN  Take 5 mg by mouth at bedtime as needed for sleep.           Total Time in preparing paper work, data evaluation and todays exam - 35 minutes  Auburn Bilberry M.D on 03/25/2015 at 12:02 [facility]  [facility]   Office  [phone]

## 2015-04-05 DIAGNOSIS — R911 Solitary pulmonary nodule: Secondary | ICD-10-CM | POA: Diagnosis not present

## 2015-04-05 DIAGNOSIS — F5102 Adjustment insomnia: Secondary | ICD-10-CM | POA: Diagnosis not present

## 2015-04-05 DIAGNOSIS — K5792 Diverticulitis of intestine, part unspecified, without perforation or abscess without bleeding: Secondary | ICD-10-CM | POA: Diagnosis not present

## 2015-04-05 DIAGNOSIS — R32 Unspecified urinary incontinence: Secondary | ICD-10-CM | POA: Diagnosis not present

## 2015-04-12 DIAGNOSIS — K5792 Diverticulitis of intestine, part unspecified, without perforation or abscess without bleeding: Secondary | ICD-10-CM | POA: Diagnosis not present

## 2015-04-13 DIAGNOSIS — Z23 Encounter for immunization: Secondary | ICD-10-CM | POA: Diagnosis not present

## 2015-04-23 DIAGNOSIS — G4733 Obstructive sleep apnea (adult) (pediatric): Secondary | ICD-10-CM | POA: Diagnosis not present

## 2015-05-03 DIAGNOSIS — G4733 Obstructive sleep apnea (adult) (pediatric): Secondary | ICD-10-CM | POA: Diagnosis not present

## 2015-05-05 DIAGNOSIS — G4733 Obstructive sleep apnea (adult) (pediatric): Secondary | ICD-10-CM | POA: Diagnosis not present

## 2015-05-05 DIAGNOSIS — F064 Anxiety disorder due to known physiological condition: Secondary | ICD-10-CM | POA: Diagnosis not present

## 2015-05-05 DIAGNOSIS — R32 Unspecified urinary incontinence: Secondary | ICD-10-CM | POA: Diagnosis not present

## 2015-05-05 DIAGNOSIS — Z87448 Personal history of other diseases of urinary system: Secondary | ICD-10-CM | POA: Diagnosis not present

## 2015-05-06 DIAGNOSIS — M25519 Pain in unspecified shoulder: Secondary | ICD-10-CM | POA: Diagnosis not present

## 2015-06-07 DIAGNOSIS — G4723 Circadian rhythm sleep disorder, irregular sleep wake type: Secondary | ICD-10-CM | POA: Diagnosis not present

## 2015-06-07 DIAGNOSIS — G4733 Obstructive sleep apnea (adult) (pediatric): Secondary | ICD-10-CM | POA: Diagnosis not present

## 2015-07-07 ENCOUNTER — Other Ambulatory Visit: Payer: Self-pay | Admitting: Internal Medicine

## 2015-07-07 DIAGNOSIS — K563 Gallstone ileus: Secondary | ICD-10-CM

## 2015-07-07 DIAGNOSIS — R109 Unspecified abdominal pain: Secondary | ICD-10-CM | POA: Diagnosis not present

## 2015-07-07 DIAGNOSIS — R079 Chest pain, unspecified: Secondary | ICD-10-CM | POA: Diagnosis not present

## 2015-07-07 DIAGNOSIS — R05 Cough: Secondary | ICD-10-CM | POA: Diagnosis not present

## 2015-07-08 DIAGNOSIS — G4723 Circadian rhythm sleep disorder, irregular sleep wake type: Secondary | ICD-10-CM | POA: Diagnosis not present

## 2015-07-08 DIAGNOSIS — G4733 Obstructive sleep apnea (adult) (pediatric): Secondary | ICD-10-CM | POA: Diagnosis not present

## 2015-07-11 ENCOUNTER — Ambulatory Visit
Admission: RE | Admit: 2015-07-11 | Discharge: 2015-07-11 | Disposition: A | Discharge status: Home / Self Care | Payer: Medicare Other | Source: Ambulatory Visit | Attending: Internal Medicine | Admitting: Internal Medicine

## 2015-07-11 DIAGNOSIS — K8020 Calculus of gallbladder without cholecystitis without obstruction: Secondary | ICD-10-CM | POA: Insufficient documentation

## 2015-07-11 DIAGNOSIS — R112 Nausea with vomiting, unspecified: Secondary | ICD-10-CM | POA: Diagnosis not present

## 2015-07-11 DIAGNOSIS — K563 Gallstone ileus: Secondary | ICD-10-CM

## 2015-07-11 DIAGNOSIS — R109 Unspecified abdominal pain: Secondary | ICD-10-CM | POA: Diagnosis not present

## 2015-07-12 ENCOUNTER — Ambulatory Visit
Admission: RE | Admit: 2015-07-12 | Discharge: 2015-07-12 | Disposition: A | Discharge status: Home / Self Care | Payer: Medicare Other | Source: Ambulatory Visit | Attending: Cardiology | Admitting: Cardiology

## 2015-07-12 ENCOUNTER — Ambulatory Visit
Admission: RE | Admit: 2015-07-12 | Discharge: 2015-07-12 | Disposition: A | Discharge status: Home / Self Care | Payer: Medicare Other | Source: Ambulatory Visit | Attending: Internal Medicine | Admitting: Internal Medicine

## 2015-07-12 ENCOUNTER — Other Ambulatory Visit: Payer: Self-pay | Admitting: Internal Medicine

## 2015-07-12 DIAGNOSIS — J984 Other disorders of lung: Secondary | ICD-10-CM | POA: Insufficient documentation

## 2015-07-12 DIAGNOSIS — K449 Diaphragmatic hernia without obstruction or gangrene: Secondary | ICD-10-CM | POA: Insufficient documentation

## 2015-07-12 DIAGNOSIS — G4733 Obstructive sleep apnea (adult) (pediatric): Secondary | ICD-10-CM | POA: Diagnosis not present

## 2015-07-12 DIAGNOSIS — R05 Cough: Secondary | ICD-10-CM | POA: Diagnosis not present

## 2015-07-12 DIAGNOSIS — R079 Chest pain, unspecified: Secondary | ICD-10-CM

## 2015-08-07 DIAGNOSIS — G4733 Obstructive sleep apnea (adult) (pediatric): Secondary | ICD-10-CM | POA: Diagnosis not present

## 2015-08-07 DIAGNOSIS — G4723 Circadian rhythm sleep disorder, irregular sleep wake type: Secondary | ICD-10-CM | POA: Diagnosis not present

## 2015-08-09 DIAGNOSIS — Z0000 Encounter for general adult medical examination without abnormal findings: Secondary | ICD-10-CM | POA: Diagnosis not present

## 2015-09-22 DIAGNOSIS — H35371 Puckering of macula, right eye: Secondary | ICD-10-CM | POA: Diagnosis not present

## 2015-10-11 DIAGNOSIS — R911 Solitary pulmonary nodule: Secondary | ICD-10-CM | POA: Diagnosis not present

## 2015-10-11 DIAGNOSIS — Z87448 Personal history of other diseases of urinary system: Secondary | ICD-10-CM | POA: Diagnosis not present

## 2015-10-11 DIAGNOSIS — F5102 Adjustment insomnia: Secondary | ICD-10-CM | POA: Diagnosis not present

## 2015-10-14 ENCOUNTER — Ambulatory Visit
Admission: RE | Admit: 2015-10-14 | Discharge: 2015-10-14 | Disposition: A | Discharge status: Home / Self Care | Payer: Medicare Other | Source: Ambulatory Visit | Attending: Cardiology | Admitting: Cardiology

## 2015-10-14 ENCOUNTER — Ambulatory Visit
Admission: RE | Admit: 2015-10-14 | Discharge: 2015-10-14 | Disposition: A | Discharge status: Home / Self Care | Payer: Medicare Other | Source: Ambulatory Visit | Attending: Internal Medicine | Admitting: Internal Medicine

## 2015-10-14 ENCOUNTER — Other Ambulatory Visit: Payer: Self-pay | Admitting: Internal Medicine

## 2015-10-14 DIAGNOSIS — R911 Solitary pulmonary nodule: Secondary | ICD-10-CM

## 2015-10-14 DIAGNOSIS — R918 Other nonspecific abnormal finding of lung field: Secondary | ICD-10-CM | POA: Diagnosis not present

## 2015-10-14 DIAGNOSIS — M170 Bilateral primary osteoarthritis of knee: Secondary | ICD-10-CM | POA: Diagnosis not present

## 2015-12-12 DIAGNOSIS — M170 Bilateral primary osteoarthritis of knee: Secondary | ICD-10-CM | POA: Diagnosis not present

## 2015-12-15 DIAGNOSIS — G4733 Obstructive sleep apnea (adult) (pediatric): Secondary | ICD-10-CM | POA: Diagnosis not present

## 2015-12-16 DIAGNOSIS — G4733 Obstructive sleep apnea (adult) (pediatric): Secondary | ICD-10-CM | POA: Diagnosis not present

## 2015-12-29 DIAGNOSIS — J180 Bronchopneumonia, unspecified organism: Secondary | ICD-10-CM | POA: Diagnosis not present

## 2016-01-02 DIAGNOSIS — F064 Anxiety disorder due to known physiological condition: Secondary | ICD-10-CM | POA: Diagnosis not present

## 2016-01-02 DIAGNOSIS — R32 Unspecified urinary incontinence: Secondary | ICD-10-CM | POA: Diagnosis not present

## 2016-01-02 DIAGNOSIS — F5102 Adjustment insomnia: Secondary | ICD-10-CM | POA: Diagnosis not present

## 2016-01-02 DIAGNOSIS — R61 Generalized hyperhidrosis: Secondary | ICD-10-CM | POA: Diagnosis not present

## 2016-01-14 DIAGNOSIS — G4733 Obstructive sleep apnea (adult) (pediatric): Secondary | ICD-10-CM | POA: Diagnosis not present

## 2016-01-17 ENCOUNTER — Ambulatory Visit
Admission: RE | Admit: 2016-01-17 | Discharge: 2016-01-17 | Disposition: A | Discharge status: Home / Self Care | Payer: Medicare Other | Source: Ambulatory Visit | Attending: Internal Medicine | Admitting: Internal Medicine

## 2016-01-17 ENCOUNTER — Other Ambulatory Visit: Payer: Self-pay | Admitting: Cardiology

## 2016-01-17 ENCOUNTER — Ambulatory Visit
Admission: RE | Admit: 2016-01-17 | Discharge: 2016-01-17 | Disposition: A | Discharge status: Home / Self Care | Payer: Medicare Other | Source: Ambulatory Visit | Attending: Cardiology | Admitting: Cardiology

## 2016-01-17 DIAGNOSIS — J42 Unspecified chronic bronchitis: Secondary | ICD-10-CM | POA: Insufficient documentation

## 2016-01-17 DIAGNOSIS — R059 Cough, unspecified: Secondary | ICD-10-CM

## 2016-01-17 DIAGNOSIS — R05 Cough: Secondary | ICD-10-CM

## 2016-01-17 DIAGNOSIS — K449 Diaphragmatic hernia without obstruction or gangrene: Secondary | ICD-10-CM | POA: Diagnosis not present

## 2016-01-17 DIAGNOSIS — R0602 Shortness of breath: Secondary | ICD-10-CM | POA: Diagnosis not present

## 2016-01-17 DIAGNOSIS — J219 Acute bronchiolitis, unspecified: Secondary | ICD-10-CM | POA: Diagnosis not present

## 2016-01-26 DIAGNOSIS — J210 Acute bronchiolitis due to respiratory syncytial virus: Secondary | ICD-10-CM | POA: Diagnosis not present

## 2016-01-26 DIAGNOSIS — J209 Acute bronchitis, unspecified: Secondary | ICD-10-CM | POA: Diagnosis not present

## 2016-01-26 DIAGNOSIS — J441 Chronic obstructive pulmonary disease with (acute) exacerbation: Secondary | ICD-10-CM | POA: Diagnosis not present

## 2016-02-14 DIAGNOSIS — G4733 Obstructive sleep apnea (adult) (pediatric): Secondary | ICD-10-CM | POA: Diagnosis not present

## 2016-02-23 DIAGNOSIS — J069 Acute upper respiratory infection, unspecified: Secondary | ICD-10-CM | POA: Diagnosis not present

## 2016-03-06 ENCOUNTER — Ambulatory Visit
Admission: RE | Admit: 2016-03-06 | Discharge: 2016-03-06 | Disposition: A | Discharge status: Home / Self Care | Payer: Medicare Other | Source: Ambulatory Visit | Attending: Cardiology | Admitting: Cardiology

## 2016-03-06 ENCOUNTER — Other Ambulatory Visit: Payer: Self-pay | Admitting: Cardiology

## 2016-03-06 ENCOUNTER — Ambulatory Visit
Admission: RE | Admit: 2016-03-06 | Discharge: 2016-03-06 | Disposition: A | Discharge status: Home / Self Care | Payer: Medicare Other | Source: Ambulatory Visit | Attending: Internal Medicine | Admitting: Internal Medicine

## 2016-03-06 DIAGNOSIS — W19XXXA Unspecified fall, initial encounter: Secondary | ICD-10-CM | POA: Diagnosis not present

## 2016-03-06 DIAGNOSIS — R109 Unspecified abdominal pain: Secondary | ICD-10-CM | POA: Diagnosis not present

## 2016-03-06 DIAGNOSIS — R911 Solitary pulmonary nodule: Secondary | ICD-10-CM | POA: Diagnosis not present

## 2016-03-06 DIAGNOSIS — G4733 Obstructive sleep apnea (adult) (pediatric): Secondary | ICD-10-CM | POA: Diagnosis not present

## 2016-03-08 DIAGNOSIS — R32 Unspecified urinary incontinence: Secondary | ICD-10-CM | POA: Diagnosis not present

## 2016-03-08 DIAGNOSIS — R61 Generalized hyperhidrosis: Secondary | ICD-10-CM | POA: Diagnosis not present

## 2016-03-08 DIAGNOSIS — R911 Solitary pulmonary nodule: Secondary | ICD-10-CM | POA: Diagnosis not present

## 2016-03-08 DIAGNOSIS — F064 Anxiety disorder due to known physiological condition: Secondary | ICD-10-CM | POA: Diagnosis not present

## 2016-03-15 DIAGNOSIS — G4733 Obstructive sleep apnea (adult) (pediatric): Secondary | ICD-10-CM | POA: Diagnosis not present

## 2016-03-23 DIAGNOSIS — M1710 Unilateral primary osteoarthritis, unspecified knee: Secondary | ICD-10-CM | POA: Insufficient documentation

## 2016-03-23 DIAGNOSIS — M170 Bilateral primary osteoarthritis of knee: Secondary | ICD-10-CM | POA: Diagnosis not present

## 2016-03-23 DIAGNOSIS — M179 Osteoarthritis of knee, unspecified: Secondary | ICD-10-CM | POA: Insufficient documentation

## 2016-03-28 DIAGNOSIS — R269 Unspecified abnormalities of gait and mobility: Secondary | ICD-10-CM | POA: Diagnosis not present

## 2016-04-15 DIAGNOSIS — G4733 Obstructive sleep apnea (adult) (pediatric): Secondary | ICD-10-CM | POA: Diagnosis not present

## 2016-04-17 DIAGNOSIS — R911 Solitary pulmonary nodule: Secondary | ICD-10-CM | POA: Diagnosis not present

## 2016-04-17 DIAGNOSIS — J209 Acute bronchitis, unspecified: Secondary | ICD-10-CM | POA: Diagnosis not present

## 2016-04-17 DIAGNOSIS — R0602 Shortness of breath: Secondary | ICD-10-CM | POA: Diagnosis not present

## 2016-05-03 DIAGNOSIS — Z87448 Personal history of other diseases of urinary system: Secondary | ICD-10-CM | POA: Diagnosis not present

## 2016-05-03 DIAGNOSIS — R911 Solitary pulmonary nodule: Secondary | ICD-10-CM | POA: Diagnosis not present

## 2016-05-03 DIAGNOSIS — R61 Generalized hyperhidrosis: Secondary | ICD-10-CM | POA: Diagnosis not present

## 2016-05-16 DIAGNOSIS — G4733 Obstructive sleep apnea (adult) (pediatric): Secondary | ICD-10-CM | POA: Diagnosis not present

## 2016-06-14 DIAGNOSIS — M170 Bilateral primary osteoarthritis of knee: Secondary | ICD-10-CM | POA: Diagnosis not present

## 2016-06-15 DIAGNOSIS — G4733 Obstructive sleep apnea (adult) (pediatric): Secondary | ICD-10-CM | POA: Diagnosis not present

## 2016-07-09 DIAGNOSIS — G451 Carotid artery syndrome (hemispheric): Secondary | ICD-10-CM | POA: Diagnosis not present

## 2016-07-09 DIAGNOSIS — R911 Solitary pulmonary nodule: Secondary | ICD-10-CM | POA: Diagnosis not present

## 2016-07-09 DIAGNOSIS — G450 Vertebro-basilar artery syndrome: Secondary | ICD-10-CM | POA: Diagnosis not present

## 2016-07-09 DIAGNOSIS — Z87448 Personal history of other diseases of urinary system: Secondary | ICD-10-CM | POA: Diagnosis not present

## 2016-07-17 DIAGNOSIS — R911 Solitary pulmonary nodule: Secondary | ICD-10-CM | POA: Diagnosis not present

## 2016-07-17 DIAGNOSIS — G450 Vertebro-basilar artery syndrome: Secondary | ICD-10-CM | POA: Diagnosis not present

## 2016-07-17 DIAGNOSIS — R609 Edema, unspecified: Secondary | ICD-10-CM | POA: Diagnosis not present

## 2016-07-17 DIAGNOSIS — G451 Carotid artery syndrome (hemispheric): Secondary | ICD-10-CM | POA: Diagnosis not present

## 2016-07-17 DIAGNOSIS — R55 Syncope and collapse: Secondary | ICD-10-CM | POA: Diagnosis not present

## 2016-07-18 ENCOUNTER — Other Ambulatory Visit: Payer: Self-pay | Admitting: Cardiology

## 2016-07-18 ENCOUNTER — Other Ambulatory Visit
Admission: RE | Admit: 2016-07-18 | Discharge: 2016-07-18 | Disposition: A | Discharge status: Home / Self Care | Payer: Medicare Other | Source: Ambulatory Visit | Attending: Internal Medicine | Admitting: Internal Medicine

## 2016-07-18 DIAGNOSIS — R55 Syncope and collapse: Secondary | ICD-10-CM | POA: Insufficient documentation

## 2016-07-18 LAB — BASIC METABOLIC PANEL
Anion gap: 8 (ref 5–15)
BUN: 18 mg/dL (ref 6–20)
CO2: 27 mmol/L (ref 22–32)
Calcium: 9.6 mg/dL (ref 8.9–10.3)
Chloride: 105 mmol/L (ref 101–111)
Creatinine, Ser: 0.84 mg/dL (ref 0.44–1.00)
GFR calc Af Amer: >60 mL/min (ref >60)
GFR calc non Af Amer: >60 mL/min (ref >60)
Glucose, Bld: 98 mg/dL (ref 65–99)
Potassium: 4.3 mmol/L (ref 3.5–5.1)
Sodium: 140 mmol/L (ref 135–145)

## 2016-07-18 LAB — CBC
HCT: 40.5 % (ref 35.0–47.0)
Hemoglobin: 13.8 g/dL (ref 12.0–16.0)
MCH: 29.7 pg (ref 26.0–34.0)
MCHC: 34.1 g/dL (ref 32.0–36.0)
MCV: 87.2 fL (ref 80.0–100.0)
Platelets: 238 10*3/uL (ref 150–440)
RBC: 4.65 MIL/uL (ref 3.80–5.20)
RDW: 14.6 % — ABNORMAL HIGH (ref 11.5–14.5)
WBC: 10.5 10*3/uL (ref 3.6–11.0)

## 2016-07-18 LAB — TSH: TSH: 0.143 u[IU]/mL — ABNORMAL LOW (ref 0.350–4.500)

## 2016-07-19 DIAGNOSIS — G450 Vertebro-basilar artery syndrome: Secondary | ICD-10-CM | POA: Diagnosis not present

## 2016-07-19 DIAGNOSIS — G451 Carotid artery syndrome (hemispheric): Secondary | ICD-10-CM | POA: Diagnosis not present

## 2016-07-19 DIAGNOSIS — R062 Wheezing: Secondary | ICD-10-CM | POA: Diagnosis not present

## 2016-07-19 DIAGNOSIS — R55 Syncope and collapse: Secondary | ICD-10-CM | POA: Diagnosis not present

## 2016-07-25 ENCOUNTER — Ambulatory Visit
Admission: RE | Admit: 2016-07-25 | Discharge: 2016-07-25 | Disposition: A | Discharge status: Home / Self Care | Payer: Medicare Other | Source: Ambulatory Visit | Attending: Cardiology | Admitting: Cardiology

## 2016-07-25 DIAGNOSIS — R55 Syncope and collapse: Secondary | ICD-10-CM | POA: Diagnosis not present

## 2016-08-06 DIAGNOSIS — Z87448 Personal history of other diseases of urinary system: Secondary | ICD-10-CM | POA: Diagnosis not present

## 2016-08-06 DIAGNOSIS — R911 Solitary pulmonary nodule: Secondary | ICD-10-CM | POA: Diagnosis not present

## 2016-08-06 DIAGNOSIS — R609 Edema, unspecified: Secondary | ICD-10-CM | POA: Diagnosis not present

## 2016-08-14 DIAGNOSIS — R05 Cough: Secondary | ICD-10-CM | POA: Diagnosis not present

## 2016-08-14 DIAGNOSIS — H109 Unspecified conjunctivitis: Secondary | ICD-10-CM | POA: Diagnosis not present

## 2016-08-14 DIAGNOSIS — J019 Acute sinusitis, unspecified: Secondary | ICD-10-CM | POA: Diagnosis not present

## 2016-09-03 DIAGNOSIS — G450 Vertebro-basilar artery syndrome: Secondary | ICD-10-CM | POA: Diagnosis not present

## 2016-09-03 DIAGNOSIS — J209 Acute bronchitis, unspecified: Secondary | ICD-10-CM | POA: Diagnosis not present

## 2016-09-03 DIAGNOSIS — G451 Carotid artery syndrome (hemispheric): Secondary | ICD-10-CM | POA: Diagnosis not present

## 2016-09-08 ENCOUNTER — Emergency Department: Payer: Medicare Other

## 2016-09-08 ENCOUNTER — Inpatient Hospital Stay
Admission: EM | Admit: 2016-09-08 | Discharge: 2016-09-14 | DRG: 202 | Disposition: A | Discharge status: Home / Self Care | Payer: Medicare Other | Attending: Internal Medicine | Admitting: Internal Medicine

## 2016-09-08 DIAGNOSIS — J986 Disorders of diaphragm: Secondary | ICD-10-CM | POA: Diagnosis present

## 2016-09-08 DIAGNOSIS — R05 Cough: Secondary | ICD-10-CM

## 2016-09-08 DIAGNOSIS — I10 Essential (primary) hypertension: Secondary | ICD-10-CM | POA: Diagnosis not present

## 2016-09-08 DIAGNOSIS — R262 Difficulty in walking, not elsewhere classified: Secondary | ICD-10-CM

## 2016-09-08 DIAGNOSIS — E785 Hyperlipidemia, unspecified: Secondary | ICD-10-CM | POA: Diagnosis not present

## 2016-09-08 DIAGNOSIS — Z882 Allergy status to sulfonamides status: Secondary | ICD-10-CM

## 2016-09-08 DIAGNOSIS — R269 Unspecified abnormalities of gait and mobility: Secondary | ICD-10-CM | POA: Diagnosis not present

## 2016-09-08 DIAGNOSIS — E860 Dehydration: Secondary | ICD-10-CM | POA: Diagnosis not present

## 2016-09-08 DIAGNOSIS — F329 Major depressive disorder, single episode, unspecified: Secondary | ICD-10-CM | POA: Diagnosis present

## 2016-09-08 DIAGNOSIS — K219 Gastro-esophageal reflux disease without esophagitis: Secondary | ICD-10-CM | POA: Diagnosis not present

## 2016-09-08 DIAGNOSIS — G4700 Insomnia, unspecified: Secondary | ICD-10-CM | POA: Diagnosis not present

## 2016-09-08 DIAGNOSIS — R531 Weakness: Secondary | ICD-10-CM | POA: Diagnosis not present

## 2016-09-08 DIAGNOSIS — Z6830 Body mass index (BMI) 30.0-30.9, adult: Secondary | ICD-10-CM

## 2016-09-08 DIAGNOSIS — J9801 Acute bronchospasm: Secondary | ICD-10-CM

## 2016-09-08 DIAGNOSIS — J189 Pneumonia, unspecified organism: Secondary | ICD-10-CM | POA: Diagnosis not present

## 2016-09-08 DIAGNOSIS — E039 Hypothyroidism, unspecified: Secondary | ICD-10-CM | POA: Diagnosis not present

## 2016-09-08 DIAGNOSIS — J9601 Acute respiratory failure with hypoxia: Secondary | ICD-10-CM | POA: Diagnosis present

## 2016-09-08 DIAGNOSIS — J209 Acute bronchitis, unspecified: Secondary | ICD-10-CM | POA: Diagnosis not present

## 2016-09-08 DIAGNOSIS — Z7982 Long term (current) use of aspirin: Secondary | ICD-10-CM

## 2016-09-08 DIAGNOSIS — R053 Chronic cough: Secondary | ICD-10-CM

## 2016-09-08 DIAGNOSIS — Z880 Allergy status to penicillin: Secondary | ICD-10-CM

## 2016-09-08 DIAGNOSIS — R0602 Shortness of breath: Secondary | ICD-10-CM

## 2016-09-08 DIAGNOSIS — J40 Bronchitis, not specified as acute or chronic: Secondary | ICD-10-CM | POA: Diagnosis present

## 2016-09-08 DIAGNOSIS — R918 Other nonspecific abnormal finding of lung field: Secondary | ICD-10-CM | POA: Diagnosis not present

## 2016-09-08 DIAGNOSIS — M6281 Muscle weakness (generalized): Secondary | ICD-10-CM | POA: Diagnosis not present

## 2016-09-08 DIAGNOSIS — E669 Obesity, unspecified: Secondary | ICD-10-CM | POA: Diagnosis present

## 2016-09-08 DIAGNOSIS — J42 Unspecified chronic bronchitis: Secondary | ICD-10-CM | POA: Diagnosis present

## 2016-09-08 LAB — URINALYSIS, COMPLETE (UACMP) WITH MICROSCOPIC
Bacteria, UA: NONE SEEN
Bilirubin Urine: NEGATIVE
Glucose, UA: NEGATIVE mg/dL
Hgb urine dipstick: NEGATIVE
Ketones, ur: 5 mg/dL — AB
Leukocytes, UA: NEGATIVE
Nitrite: NEGATIVE
Protein, ur: NEGATIVE mg/dL
RBC / HPF: NONE SEEN RBC/hpf (ref 0–5)
Specific Gravity, Urine: 1.026 (ref 1.005–1.030)
pH: 5 (ref 5.0–8.0)

## 2016-09-08 LAB — BASIC METABOLIC PANEL
Anion gap: 7 (ref 5–15)
BUN: 18 mg/dL (ref 6–20)
CO2: 27 mmol/L (ref 22–32)
Calcium: 9.2 mg/dL (ref 8.9–10.3)
Chloride: 104 mmol/L (ref 101–111)
Creatinine, Ser: 0.89 mg/dL (ref 0.44–1.00)
GFR calc Af Amer: >60 mL/min (ref >60)
GFR calc non Af Amer: 58 mL/min — ABNORMAL LOW (ref >60)
Glucose, Bld: 96 mg/dL (ref 65–99)
Potassium: 3.9 mmol/L (ref 3.5–5.1)
Sodium: 138 mmol/L (ref 135–145)

## 2016-09-08 LAB — CBC
HCT: 41.3 % (ref 35.0–47.0)
Hemoglobin: 13.9 g/dL (ref 12.0–16.0)
MCH: 29.7 pg (ref 26.0–34.0)
MCHC: 33.7 g/dL (ref 32.0–36.0)
MCV: 88 fL (ref 80.0–100.0)
Platelets: 223 10*3/uL (ref 150–440)
RBC: 4.7 MIL/uL (ref 3.80–5.20)
RDW: 14.6 % — ABNORMAL HIGH (ref 11.5–14.5)
WBC: 9.1 10*3/uL (ref 3.6–11.0)

## 2016-09-08 LAB — TROPONIN I: Troponin I: <0.03 ng/mL (ref <0.03)

## 2016-09-08 MED ORDER — METHYLPREDNISOLONE SODIUM SUCC 125 MG IJ SOLR
125.0000 mg | Freq: Once | INTRAMUSCULAR | Status: AC
  Administered 2016-09-08: 125 mg via INTRAVENOUS
  Filled 2016-09-08: qty 2

## 2016-09-08 MED ORDER — IPRATROPIUM-ALBUTEROL 0.5-2.5 (3) MG/3ML IN SOLN
3.0000 mL | Freq: Once | RESPIRATORY_TRACT | Status: AC
  Administered 2016-09-08: 3 mL via RESPIRATORY_TRACT

## 2016-09-08 MED ORDER — IPRATROPIUM-ALBUTEROL 0.5-2.5 (3) MG/3ML IN SOLN: 3.0000 mL | Freq: Once | RESPIRATORY_TRACT | Status: DC

## 2016-09-08 MED ORDER — IPRATROPIUM-ALBUTEROL 0.5-2.5 (3) MG/3ML IN SOLN
RESPIRATORY_TRACT | Status: AC
  Administered 2016-09-08: 3 mL via RESPIRATORY_TRACT
  Filled 2016-09-08: qty 3

## 2016-09-08 NOTE — ED Triage Notes (Signed)
Pt reports has been diagnosed with pneumonia and taken antibiotics and is not getting better. Pt reports SOB, weakness and cough.

## 2016-09-08 NOTE — ED Provider Notes (Signed)
[facility] Emergency Department Provider Note   ____________________________________________    I have reviewed the triage vital signs and the nursing notes.   HISTORY  Chief Complaint Shortness of Breath and Weakness     HPI Abigail Strong is a 83 y.o. female who presents with complaints of shortness of breath and weakness. Patient was sent to the ED by urgent care, apparently she has been treated with antibiotics but without improvement. She denies chest pain or recent travel. No calf pain or swelling. No nausea or vomiting. She reports she feels weak all over ".   Past Medical History:  Diagnosis Date  . Depression   . GERD (gastroesophageal reflux disease)   . Hyperlipidemia   . Hypothyroidism   . Insomnia     Patient Active Problem List   Diagnosis Date Noted  . Diverticulitis 03/23/2015  . HYPERTENSION 12/08/2009  . COUGH 12/08/2009    Past Surgical History:  Procedure Laterality Date  . ABDOMINAL HYSTERECTOMY    . COLON SURGERY     partial colectomy  . NISSEN FUNDOPLICATION      Prior to Admission medications   Medication Sig Start Date End Date Taking? Authorizing Provider  acetaminophen (TYLENOL) 500 MG tablet Take 1,000 mg by mouth every 6 (six) hours as needed.    Historical Provider, MD  Ascorbic Acid (VITAMIN C) 1000 MG tablet Take 1,000 mg by mouth daily.    Historical Provider, MD  aspirin EC 81 MG tablet Take 81 mg by mouth daily.    Historical Provider, MD  Cholecalciferol (VITAMIN D3) 5000 UNITS CAPS Take 5,000 Units by mouth daily.    Historical Provider, MD  citalopram (CELEXA) 20 MG tablet Take 20 mg by mouth daily.    Historical Provider, MD  Ginkgo Biloba 120 MG CAPS Take 120 mg by mouth daily.    Historical Provider, MD  levothyroxine (SYNTHROID, LEVOTHROID) 125 MCG tablet Take 125 mcg by mouth daily before breakfast.    Historical Provider, MD  meclizine (ANTIVERT) 12.5 MG tablet Take 12.5 mg by mouth 2  (two) times daily.    Historical Provider, MD  Multiple Vitamins-Minerals (CENTRUM SILVER ADULT 50+) TABS Take 1 tablet by mouth daily.    Historical Provider, MD  omeprazole (PRILOSEC) 40 MG capsule Take 40 mg by mouth 2 (two) times daily.    Historical Provider, MD  pravastatin (PRAVACHOL) 40 MG tablet Take 40 mg by mouth at bedtime.    Historical Provider, MD  traZODone (DESYREL) 50 MG tablet Take 50 mg by mouth at bedtime.    Historical Provider, MD  zolpidem (AMBIEN) 10 MG tablet Take 5 mg by mouth at bedtime as needed for sleep.    Historical Provider, MD     Allergies Penicillins and Sulfonamide derivatives  No family history on file.  Social History Social History  Substance Use Topics  . Smoking status: Never Smoker  . Smokeless tobacco: Not on file  . Alcohol use No    Review of Systems  Constitutional: No fever/chills  ENT: No sore throat. Cardiovascular: Denies chest pain. Respiratory: Positive shortness of breath and cough Gastrointestinal: No abdominal pain.  No nausea, no vomiting.   . Musculoskeletal: Negative for back pain. Skin: Negative for rash. Neurological: Negative for headache  10-point ROS otherwise negative.  ____________________________________________   PHYSICAL EXAM:  VITAL SIGNS: ED Triage Vitals  Enc Vitals Group     BP 09/08/16 1629 113/73     Pulse Rate 09/08/16 1629  89     Resp 09/08/16 1629 20     Temp 09/08/16 1629 98 F (36.7 C)     Temp Source 09/08/16 1629 Oral     SpO2 09/08/16 1629 94 %     Weight 09/08/16 1630 180 lb (81.6 kg)     Height 09/08/16 1630 5\' 5"  (1.651 m)     Head Circumference --      Peak Flow --      Pain Score 09/08/16 1815 0     Pain Loc --      Pain Edu? --      Excl. in [place]? --     Constitutional: Alert and oriented. No acute distress. Pleasant and interactive Eyes: Conjunctivae are normal.   Nose: No congestion/rhinnorhea. Mouth/Throat: Mucous membranes are moist.    Cardiovascular: Normal  rate, regular rhythm. Grossly normal heart sounds.  Good peripheral circulation. Respiratory: Increased respiratory effort,, scattered wheezes bilaterally Gastrointestinal: Soft and nontender. No distention.  No CVA tenderness. Genitourinary: deferred Musculoskeletal: No lower extremity tenderness nor edema.  Warm and well perfused Neurologic:  Normal speech and language. No gross focal neurologic deficits are appreciated.  Skin:  Skin is warm, dry and intact. No rash noted. Psychiatric: Mood and affect are normal. Speech and behavior are normal.  ____________________________________________   LABS (all labs ordered are listed, but only abnormal results are displayed)  Labs Reviewed  BASIC METABOLIC PANEL - Abnormal; Notable for the following:       Result Value   GFR calc non Af Amer 58 (*)    All other components within normal limits  CBC - Abnormal; Notable for the following:    RDW 14.6 (*)    All other components within normal limits  URINALYSIS, COMPLETE (UACMP) WITH MICROSCOPIC - Abnormal; Notable for the following:    Color, Urine YELLOW (*)    APPearance CLEAR (*)    Ketones, ur 5 (*)    Squamous Epithelial / LPF 0-5 (*)    All other components within normal limits  TROPONIN I   ____________________________________________  EKG  ED ECG REPORT I, Lavonia Drafts, the attending physician, personally viewed and interpreted this ECG.  Date: 09/08/2016 EKG Time: 4:35 PM Rate: 83 Rhythm: normal sinus rhythm QRS Axis: Left axis deviation Intervals: normal ST/T Wave abnormalities: normal Conduction Disturbances: none   ____________________________________________  RADIOLOGY  Normal chest x-ray ____________________________________________   PROCEDURES  Procedure(s) performed: No    Critical Care performed: No ____________________________________________   INITIAL IMPRESSION / ASSESSMENT AND PLAN / ED COURSE  Pertinent labs & imaging results that were  available during my care of the patient were reviewed by me and considered in my medical decision making (see chart for details).  Patient presents with weakness, fatigue, shortness of breath and cough. Her chest x-ray is unremarkable and her labs are reassuring. She is afebrile but frail and ill-appearing. She has wheezing bilaterally as suspect bronchitis with bronchospasm. She will require admission given her weakness    ____________________________________________   FINAL CLINICAL IMPRESSION(S) / ED DIAGNOSES  Final diagnoses:  Generalized weakness  Shortness of breath  Bronchospasm, acute      NEW MEDICATIONS STARTED DURING THIS VISIT:  New Prescriptions   No medications on file     Note:  This document was prepared using Dragon voice recognition software and may include unintentional dictation errors.    Lavonia Drafts, MD 09/08/16 2253

## 2016-09-08 NOTE — ED Notes (Signed)
Pt's daughter leaving, requests call with update, Ivin Booty [phone]

## 2016-09-09 ENCOUNTER — Encounter: Payer: Self-pay | Admitting: *Deleted

## 2016-09-09 DIAGNOSIS — J42 Unspecified chronic bronchitis: Secondary | ICD-10-CM | POA: Diagnosis present

## 2016-09-09 DIAGNOSIS — Z880 Allergy status to penicillin: Secondary | ICD-10-CM | POA: Diagnosis not present

## 2016-09-09 DIAGNOSIS — E669 Obesity, unspecified: Secondary | ICD-10-CM | POA: Diagnosis present

## 2016-09-09 DIAGNOSIS — I10 Essential (primary) hypertension: Secondary | ICD-10-CM | POA: Diagnosis present

## 2016-09-09 DIAGNOSIS — J209 Acute bronchitis, unspecified: Secondary | ICD-10-CM | POA: Diagnosis present

## 2016-09-09 DIAGNOSIS — G4700 Insomnia, unspecified: Secondary | ICD-10-CM | POA: Diagnosis present

## 2016-09-09 DIAGNOSIS — K219 Gastro-esophageal reflux disease without esophagitis: Secondary | ICD-10-CM | POA: Diagnosis present

## 2016-09-09 DIAGNOSIS — E039 Hypothyroidism, unspecified: Secondary | ICD-10-CM | POA: Diagnosis present

## 2016-09-09 DIAGNOSIS — J40 Bronchitis, not specified as acute or chronic: Secondary | ICD-10-CM | POA: Diagnosis present

## 2016-09-09 DIAGNOSIS — Z6830 Body mass index (BMI) 30.0-30.9, adult: Secondary | ICD-10-CM | POA: Diagnosis not present

## 2016-09-09 DIAGNOSIS — J9601 Acute respiratory failure with hypoxia: Secondary | ICD-10-CM | POA: Diagnosis present

## 2016-09-09 DIAGNOSIS — Z882 Allergy status to sulfonamides status: Secondary | ICD-10-CM | POA: Diagnosis not present

## 2016-09-09 DIAGNOSIS — R0602 Shortness of breath: Secondary | ICD-10-CM | POA: Diagnosis present

## 2016-09-09 DIAGNOSIS — E785 Hyperlipidemia, unspecified: Secondary | ICD-10-CM | POA: Diagnosis present

## 2016-09-09 DIAGNOSIS — J986 Disorders of diaphragm: Secondary | ICD-10-CM | POA: Diagnosis present

## 2016-09-09 DIAGNOSIS — Z7982 Long term (current) use of aspirin: Secondary | ICD-10-CM | POA: Diagnosis not present

## 2016-09-09 DIAGNOSIS — F329 Major depressive disorder, single episode, unspecified: Secondary | ICD-10-CM | POA: Diagnosis present

## 2016-09-09 LAB — BASIC METABOLIC PANEL
Anion gap: 5 (ref 5–15)
BUN: 16 mg/dL (ref 6–20)
CO2: 28 mmol/L (ref 22–32)
Calcium: 8.7 mg/dL — ABNORMAL LOW (ref 8.9–10.3)
Chloride: 107 mmol/L (ref 101–111)
Creatinine, Ser: 0.78 mg/dL (ref 0.44–1.00)
GFR calc Af Amer: >60 mL/min (ref >60)
GFR calc non Af Amer: >60 mL/min (ref >60)
Glucose, Bld: 198 mg/dL — ABNORMAL HIGH (ref 65–99)
Potassium: 4.2 mmol/L (ref 3.5–5.1)
Sodium: 140 mmol/L (ref 135–145)

## 2016-09-09 LAB — INFLUENZA PANEL BY PCR (TYPE A & B)
Influenza A By PCR: NEGATIVE
Influenza B By PCR: NEGATIVE

## 2016-09-09 LAB — CBC
HCT: 37.7 % (ref 35.0–47.0)
Hemoglobin: 12.6 g/dL (ref 12.0–16.0)
MCH: 29.6 pg (ref 26.0–34.0)
MCHC: 33.4 g/dL (ref 32.0–36.0)
MCV: 88.5 fL (ref 80.0–100.0)
Platelets: 205 10*3/uL (ref 150–440)
RBC: 4.26 MIL/uL (ref 3.80–5.20)
RDW: 14.6 % — ABNORMAL HIGH (ref 11.5–14.5)
WBC: 5 10*3/uL (ref 3.6–11.0)

## 2016-09-09 MED ORDER — ACETAMINOPHEN 325 MG PO TABS
650.0000 mg | ORAL_TABLET | Freq: Four times a day (QID) | ORAL | Status: DC | PRN
  Administered 2016-09-09 – 2016-09-11 (×2): 650 mg via ORAL
  Filled 2016-09-09 (×2): qty 2

## 2016-09-09 MED ORDER — MECLIZINE HCL 25 MG PO TABS
12.5000 mg | ORAL_TABLET | Freq: Two times a day (BID) | ORAL | Status: DC
  Administered 2016-09-09 – 2016-09-14 (×11): 12.5 mg via ORAL
  Filled 2016-09-09 (×12): qty 1

## 2016-09-09 MED ORDER — ENOXAPARIN SODIUM 40 MG/0.4ML ~~LOC~~ SOLN
40.0000 mg | Freq: Every day | SUBCUTANEOUS | Status: DC
  Administered 2016-09-09 – 2016-09-13 (×6): 40 mg via SUBCUTANEOUS
  Filled 2016-09-09 (×6): qty 0.4

## 2016-09-09 MED ORDER — DM-GUAIFENESIN ER 30-600 MG PO TB12: 1.0000 | ORAL_TABLET | Freq: Two times a day (BID) | ORAL | Status: DC

## 2016-09-09 MED ORDER — SODIUM CHLORIDE 0.9 % IV SOLN
INTRAVENOUS | Status: DC
  Administered 2016-09-09 – 2016-09-10 (×3): via INTRAVENOUS

## 2016-09-09 MED ORDER — MAGNESIUM CITRATE PO SOLN: 1.0000 | Freq: Once | ORAL | Status: DC | PRN

## 2016-09-09 MED ORDER — BISACODYL 5 MG PO TBEC: 5.0000 mg | DELAYED_RELEASE_TABLET | Freq: Every day | ORAL | Status: DC | PRN

## 2016-09-09 MED ORDER — PRAVASTATIN SODIUM 20 MG PO TABS
40.0000 mg | ORAL_TABLET | Freq: Every day | ORAL | Status: DC
  Administered 2016-09-09 – 2016-09-13 (×6): 40 mg via ORAL
  Filled 2016-09-09 (×6): qty 2

## 2016-09-09 MED ORDER — ALBUTEROL SULFATE (2.5 MG/3ML) 0.083% IN NEBU
2.5000 mg | INHALATION_SOLUTION | Freq: Four times a day (QID) | RESPIRATORY_TRACT | Status: DC | PRN
  Administered 2016-09-09 – 2016-09-13 (×7): 2.5 mg via RESPIRATORY_TRACT
  Filled 2016-09-09 (×7): qty 3

## 2016-09-09 MED ORDER — IPRATROPIUM BROMIDE 0.02 % IN SOLN
0.5000 mg | Freq: Four times a day (QID) | RESPIRATORY_TRACT | Status: DC | PRN
  Filled 2016-09-09: qty 2.5

## 2016-09-09 MED ORDER — CITALOPRAM HYDROBROMIDE 20 MG PO TABS
20.0000 mg | ORAL_TABLET | Freq: Every day | ORAL | Status: DC
  Administered 2016-09-09 – 2016-09-14 (×6): 20 mg via ORAL
  Filled 2016-09-09 (×6): qty 1

## 2016-09-09 MED ORDER — ORAL CARE MOUTH RINSE
15.0000 mL | Freq: Two times a day (BID) | OROMUCOSAL | Status: DC
  Administered 2016-09-09 – 2016-09-14 (×8): 15 mL via OROMUCOSAL

## 2016-09-09 MED ORDER — ADULT MULTIVITAMIN W/MINERALS CH
1.0000 | ORAL_TABLET | Freq: Every day | ORAL | Status: DC
Start: 2016-09-09 — End: 2016-09-14
  Administered 2016-09-09 – 2016-09-14 (×6): 1 via ORAL
  Filled 2016-09-09 (×6): qty 1

## 2016-09-09 MED ORDER — CHLORHEXIDINE GLUCONATE 0.12 % MT SOLN
15.0000 mL | Freq: Two times a day (BID) | OROMUCOSAL | Status: DC
  Administered 2016-09-09 – 2016-09-12 (×7): 15 mL via OROMUCOSAL
  Filled 2016-09-09 (×8): qty 15

## 2016-09-09 MED ORDER — ONDANSETRON HCL 4 MG PO TABS: 4.0000 mg | ORAL_TABLET | Freq: Four times a day (QID) | ORAL | Status: DC | PRN

## 2016-09-09 MED ORDER — OXYCODONE HCL 5 MG PO TABS: 5.0000 mg | ORAL_TABLET | ORAL | Status: DC | PRN

## 2016-09-09 MED ORDER — VITAMIN D 1000 UNITS PO TABS
5000.0000 [IU] | ORAL_TABLET | Freq: Every day | ORAL | Status: DC
  Administered 2016-09-09 – 2016-09-14 (×6): 5000 [IU] via ORAL
  Filled 2016-09-09 (×6): qty 5

## 2016-09-09 MED ORDER — VITAMIN C 500 MG PO TABS
1000.0000 mg | ORAL_TABLET | Freq: Every day | ORAL | Status: DC
  Administered 2016-09-09 – 2016-09-14 (×6): 1000 mg via ORAL
  Filled 2016-09-09 (×6): qty 2

## 2016-09-09 MED ORDER — TRAZODONE HCL 50 MG PO TABS
50.0000 mg | ORAL_TABLET | Freq: Every day | ORAL | Status: DC
Start: 2016-09-09 — End: 2016-09-14
  Administered 2016-09-09 – 2016-09-13 (×6): 50 mg via ORAL
  Filled 2016-09-09 (×6): qty 1

## 2016-09-09 MED ORDER — PANTOPRAZOLE SODIUM 40 MG PO TBEC
40.0000 mg | DELAYED_RELEASE_TABLET | Freq: Every day | ORAL | Status: DC
  Administered 2016-09-09 – 2016-09-14 (×6): 40 mg via ORAL
  Filled 2016-09-09 (×6): qty 1

## 2016-09-09 MED ORDER — ONDANSETRON HCL 4 MG/2ML IJ SOLN: 4.0000 mg | Freq: Four times a day (QID) | INTRAMUSCULAR | Status: DC | PRN

## 2016-09-09 MED ORDER — GUAIFENESIN ER 600 MG PO TB12
600.0000 mg | ORAL_TABLET | Freq: Two times a day (BID) | ORAL | Status: DC
  Administered 2016-09-09 – 2016-09-14 (×12): 600 mg via ORAL
  Filled 2016-09-09 (×12): qty 1

## 2016-09-09 MED ORDER — ASPIRIN EC 81 MG PO TBEC
81.0000 mg | DELAYED_RELEASE_TABLET | Freq: Every day | ORAL | Status: DC
  Administered 2016-09-09 – 2016-09-14 (×6): 81 mg via ORAL
  Filled 2016-09-09 (×6): qty 1

## 2016-09-09 MED ORDER — ZOLPIDEM TARTRATE 5 MG PO TABS
5.0000 mg | ORAL_TABLET | Freq: Every evening | ORAL | Status: DC | PRN
  Administered 2016-09-09 – 2016-09-11 (×2): 5 mg via ORAL
  Filled 2016-09-09 (×2): qty 1

## 2016-09-09 MED ORDER — DEXTROMETHORPHAN POLISTIREX ER 30 MG/5ML PO SUER
30.0000 mg | Freq: Two times a day (BID) | ORAL | Status: DC
  Administered 2016-09-09 – 2016-09-13 (×10): 30 mg via ORAL
  Filled 2016-09-09 (×12): qty 5

## 2016-09-09 MED ORDER — LEVOTHYROXINE SODIUM 100 MCG PO TABS
125.0000 ug | ORAL_TABLET | Freq: Every day | ORAL | Status: DC
  Administered 2016-09-09 – 2016-09-14 (×6): 125 ug via ORAL
  Filled 2016-09-09 (×6): qty 1

## 2016-09-09 MED ORDER — ACETAMINOPHEN 650 MG RE SUPP: 650.0000 mg | Freq: Four times a day (QID) | RECTAL | Status: DC | PRN

## 2016-09-09 MED ORDER — METHYLPREDNISOLONE SODIUM SUCC 125 MG IJ SOLR
60.0000 mg | Freq: Four times a day (QID) | INTRAMUSCULAR | Status: DC
  Administered 2016-09-09 – 2016-09-11 (×10): 60 mg via INTRAVENOUS
  Filled 2016-09-09 (×10): qty 2

## 2016-09-09 MED ORDER — SENNOSIDES-DOCUSATE SODIUM 8.6-50 MG PO TABS: 1.0000 | ORAL_TABLET | Freq: Every evening | ORAL | Status: DC | PRN

## 2016-09-09 NOTE — H&P (Signed)
History and Physical   [facility] - [place] @ [facility] Admission History and Physical [employer], D.O.    Patient Name: Abigail Strong MR#: [id number] Date of Birth: 1930-07-19 Date of Admission: 09/08/2016  Referring MD/NP/PA: Dr. Cyril Loosen Primary Care Physician: Corky Downs, MD  Patient coming from: Home  Chief Complaint: SOB  HPI: Abigail Strong is a 83 y.o. female with a known history of hypothyroidism, hyperlipidemia, hypertension, GERD, depression was in a usual state of health until about one month ago when she describes flulike symptoms. She was treated conservatively. More recently she was seen by urgent care and was prescribed antibiotics for symptoms of productive cough, generalized fatigue, malaise, chills. Her symptoms have worsened despite outpatient treatment. She now complains of persistent cough productive of clear to yellow sputum, shortness of breath, wheezing and generalized weakness..   Otherwise there has been no change in status. Patient has been taking medication as prescribed and there has been no recent change in medication or diet.  There has been no recent hospitalizations, travel or sick contacts.    Patient denies fevers, dizziness, chest pain, N/V/C/D, abdominal pain, dysuria/frequency, changes in mental status.   ED Course: Patient received DuoNeb 3 and Solu-Medrol  Review of Systems:  CONSTITUTIONAL: Positive chills, fatigue, weakness, negative weight gain/loss, headache. EYES: No blurry or double vision. ENT: No tinnitus, postnasal drip, redness or soreness of the oropharynx. RESPIRATORY: Positive cough, dyspnea, wheeze.  No hemoptysis.  CARDIOVASCULAR: No chest pain, palpitations, syncope, orthopnea. No lower extremity edema.  GASTROINTESTINAL: No nausea, vomiting, abdominal pain, diarrhea, constipation.  No hematemesis, melena or hematochezia. GENITOURINARY: No dysuria, frequency, hematuria. ENDOCRINE: No polyuria or nocturia. No  heat or cold intolerance. HEMATOLOGY: No anemia, bruising, bleeding. INTEGUMENTARY: No rashes, ulcers, lesions. MUSCULOSKELETAL: No arthritis, gout, dyspnea. NEUROLOGIC: No numbness, tingling, ataxia, seizure-type activity, weakness. PSYCHIATRIC: No anxiety, depression, insomnia.   Past Medical History:  Diagnosis Date  . Depression   . GERD (gastroesophageal reflux disease)   . Hyperlipidemia   . Hypothyroidism   . Insomnia     Past Surgical History:  Procedure Laterality Date  . ABDOMINAL HYSTERECTOMY    . COLON SURGERY     partial colectomy  . NISSEN FUNDOPLICATION       reports that she has never smoked. She does not have any smokeless tobacco history on file. She reports that she does not drink alcohol or use drugs.  Allergies  Allergen Reactions  . Penicillins Other (See Comments)    Dry throat Pt doesn't recall having any issues with penicillins  . Sulfonamide Derivatives     No family history on file. Family history has been reviewed and confirmed with patient.   Prior to Admission medications   Medication Sig Start Date End Date Taking? Authorizing Provider  acetaminophen (TYLENOL) 500 MG tablet Take 1,000 mg by mouth every 6 (six) hours as needed.   Yes Historical Provider, MD  aspirin EC 81 MG tablet Take 81 mg by mouth daily.   Yes Historical Provider, MD  citalopram (CELEXA) 20 MG tablet Take 20 mg by mouth daily.   Yes Historical Provider, MD  levothyroxine (SYNTHROID, LEVOTHROID) 125 MCG tablet Take 125 mcg by mouth daily before breakfast.   Yes Historical Provider, MD  omeprazole (PRILOSEC) 40 MG capsule Take 40 mg by mouth 2 (two) times daily.   Yes Historical Provider, MD  pravastatin (PRAVACHOL) 40 MG tablet Take 40 mg by mouth at bedtime.   Yes Historical Provider, MD  Ascorbic Acid (VITAMIN C)  1000 MG tablet Take 1,000 mg by mouth daily.    Historical Provider, MD  Cholecalciferol (VITAMIN D3) 5000 UNITS CAPS Take 5,000 Units by mouth daily.     Historical Provider, MD  Ginkgo Biloba 120 MG CAPS Take 120 mg by mouth daily.    Historical Provider, MD  meclizine (ANTIVERT) 12.5 MG tablet Take 12.5 mg by mouth 2 (two) times daily.    Historical Provider, MD  Multiple Vitamins-Minerals (CENTRUM SILVER ADULT 50+) TABS Take 1 tablet by mouth daily.    Historical Provider, MD  traZODone (DESYREL) 50 MG tablet Take 50 mg by mouth at bedtime.    Historical Provider, MD  zolpidem (AMBIEN) 10 MG tablet Take 5 mg by mouth at bedtime as needed for sleep.    Historical Provider, MD    Physical Exam: Vitals:   09/08/16 2230 09/08/16 2300 09/08/16 2330 09/09/16 0000  BP: 103/68 116/75 117/60 (!) 108/54  Pulse: 68 76 71 (!) 49  Resp: (!) 23 (!) 23 (!) 25 20  Temp:      TempSrc:      SpO2: 94% 90% 91% 92%  Weight:      Height:        GENERAL: 83 y.o.-year-old White female patient, well-developed, well-nourished lying in the bed in no acute distress.  Pleasant and cooperative.   HEENT: Head atraumatic, normocephalic. Pupils equal, round, reactive to light and accommodation. No scleral icterus. Extraocular muscles intact. Nares are patent. Oropharynx is clear. Mucus membranes moist. NECK: Supple, full range of motion. CHEST: Diffuse wheezing. No use of accessory muscles of respiration.  No reproducible chest wall tenderness.  CARDIOVASCULAR: S1, S2 normal. No murmurs, rubs, or gallops. Cap refill <2 seconds. Pulses intact distally.  ABDOMEN: Soft, nondistended, nontender. No rebound, guarding, rigidity. Normoactive bowel sounds present in all four quadrants. No organomegaly or mass. EXTREMITIES: No pedal edema, cyanosis, or clubbing. No calf tenderness or Homan's sign.  NEUROLOGIC: The patient is alert and oriented x 3. Cranial nerves II through XII are grossly intact with no focal sensorimotor deficit. Muscle strength 5/5 in all extremities. Sensation intact. Gait not checked. PSYCHIATRIC:  Normal affect, mood, thought content. SKIN: Warm, dry,  and intact without obvious rash, lesion, or ulcer.    Labs on Admission:  CBC:  Recent Labs Lab 09/08/16 1640  WBC 9.1  HGB 13.9  HCT 41.3  MCV 88.0  PLT 223   Basic Metabolic Panel:  Recent Labs Lab 09/08/16 1640  NA 138  K 3.9  CL 104  CO2 27  GLUCOSE 96  BUN 18  CREATININE 0.89  CALCIUM 9.2   GFR: Estimated Creatinine Clearance: 49.6 mL/min (by C-G formula based on SCr of 0.89 mg/dL). Liver Function Tests: No results for input(s): AST, ALT, ALKPHOS, BILITOT, PROT, ALBUMIN in the last 168 hours. No results for input(s): LIPASE, AMYLASE in the last 168 hours. No results for input(s): AMMONIA in the last 168 hours. Coagulation Profile: No results for input(s): INR, PROTIME in the last 168 hours. Cardiac Enzymes:  Recent Labs Lab 09/08/16 1640  TROPONINI <0.03   BNP (last 3 results) No results for input(s): PROBNP in the last 8760 hours. HbA1C: No results for input(s): HGBA1C in the last 72 hours. CBG: No results for input(s): GLUCAP in the last 168 hours. Lipid Profile: No results for input(s): CHOL, HDL, LDLCALC, TRIG, CHOLHDL, LDLDIRECT in the last 72 hours. Thyroid Function Tests: No results for input(s): TSH, T4TOTAL, FREET4, T3FREE, THYROIDAB in the last 72 hours. Anemia  Panel: No results for input(s): VITAMINB12, FOLATE, FERRITIN, TIBC, IRON, RETICCTPCT in the last 72 hours. Urine analysis:    Component Value Date/Time   COLORURINE YELLOW (A) 09/08/2016 1813   APPEARANCEUR CLEAR (A) 09/08/2016 1813   LABSPEC 1.026 09/08/2016 1813   PHURINE 5.0 09/08/2016 1813   GLUCOSEU NEGATIVE 09/08/2016 1813   HGBUR NEGATIVE 09/08/2016 1813   BILIRUBINUR NEGATIVE 09/08/2016 1813   KETONESUR 5 (A) 09/08/2016 1813   PROTEINUR NEGATIVE 09/08/2016 1813   NITRITE NEGATIVE 09/08/2016 1813   LEUKOCYTESUR NEGATIVE 09/08/2016 1813   Sepsis Labs: @LABRCNTIP (procalcitonin:4,lacticidven:4) )No results found for this or any previous visit (from the past 240  hour(s)).   Radiological Exams on Admission: Dg Chest 2 View  Result Date: 09/08/2016 CLINICAL DATA:  Pt reports has been diagnosed with pneumonia and taken antibiotics and is not getting better. Pt reports SOB, weakness and cough EXAM: CHEST  2 VIEW COMPARISON:  01/17/2016 FINDINGS: Stable cardiac silhouette with ectatic aorta. No effusion, infiltrate pneumothorax. Kyphosis of the spine. IMPRESSION: No acute cardiopulmonary process. Electronically Signed   By: Genevive Bi M.D.   On: 09/08/2016 20:40    EKG: Normal sinus rhythm at 83 bpm with leftward nonspecific ST-T wave changes.   Assessment/Plan Active Problems:   Bronchitis    This is a 83 y.o. female with a history of GERD, depression, HLD, hypothyroidism, insomnia now being admitted with: 1. Bronchitis, failing outpatient therapy. No evidence to suggest pneumonia at this time.   - Admit for nebs, steroids, O2, expectorants, gentle IV fluids - Check sputum culture and fluswab.   2. Weakness, likely related to #1 - PT eval History of depression-continue Celexa 4. History of hypothyroidism-continue Synthroid 5. History of dizziness/vertigo-continue Antivert 6. History of GERD-continue Prilosec 7. History of hyperlipidemia and-continue pravastatin 8. History of insomnia-continue Ambien and trazodone. -Continue aspirin   Admission status: Inpatient  IV Fluids: NS Diet/Nutrition: heart healthy Consults called: PT DVT Px: Lovenox, SCDs and early ambulation. Code Status: Full Code  Disposition Plan: To home in 1-2 days   All the records are reviewed and case discussed with ED provider. Management plans discussed with the patient and/or family who express understanding and agree with plan of care.  Kevis Qu D.O. on 09/09/2016 at 12:10 AM Between 7am to 6pm - Pager - [phone] After 6pm go to [email or web address] - Social research officer, government [facility] [place] [facility] Office [phone] CC: Primary care  physician; Corky Downs, MD   09/09/2016, 12:10 AM

## 2016-09-09 NOTE — Evaluation (Signed)
Physical Therapy Evaluation Patient Details Name: Abigail Strong MRN: [id number] DOB: 07/24/1930 Today's Date: 09/09/2016   History of Present Illness   83 y.o. female with a known history of hypothyroidism, hyperlipidemia, hypertension, GERD, depression was in a usual state of health until about one month ago when she describes flulike symptoms. She was treated conservatively. More recently she was seen by urgent care and was prescribed antibiotics for symptoms of productive cough, generalized fatigue, malaise, chills.   Clinical Impression  Pt did well with PT and though she did not quite feel back to her baseline she generally was safe and confident with circumambulating the nurses' station and was not overly reliant on the walker.  Her O2 remained in the 90s on 1 liter.  She does not require further PT intervention though she has had a few falls while getting up to the bathroom at night (over-active bladder, up "every 2 hours") and would likely be much safer if she had a bed side commode for this issue - pt interested in trying this for safety.     Follow Up Recommendations No PT follow up    Equipment Recommendations  3in1 (PT)    Recommendations for Other Services       Precautions / Restrictions Precautions Precautions: Fall Restrictions Weight Bearing Restrictions: No      Mobility  Bed Mobility Overal bed mobility: Independent             General bed mobility comments: Pt able to get up to sitting EOB w/o assist and w/o difficulty.  Transfers Overall transfer level: Independent Equipment used: Rolling walker (2 wheeled)             General transfer comment: Pt able to rise to standing with good confidence and no safety issues.    Ambulation/Gait Ambulation/Gait assistance: Supervision Ambulation Distance (Feet): 200 Feet Assistive device: Rolling walker (2 wheeled)       General Gait Details: Pt with good speed and confidence.  She did have some  fatigue with the effort (1 liter O2 t/o the effort, sats stayed in mid/low 90s).    Stairs            Wheelchair Mobility    Modified Rankin (Stroke Patients Only)       Balance Overall balance assessment: Modified Independent                                           Pertinent Vitals/Pain Pain Assessment: No/denies pain    Home Living Family/patient expects to be discharged to:: Private residence Living Arrangements: Children;Other relatives Available Help at Discharge: Family   Home Access: Stairs to enter       Home Equipment: Walker - 4 wheels;Walker - 2 wheels      Prior Function Level of Independence: Independent with assistive device(s)         Comments: Pt reports she does not get out a lot, but is able to do some community activities with family      Hand Dominance        Extremity/Trunk Assessment   Upper Extremity Assessment Upper Extremity Assessment: Overall WFL for tasks assessed    Lower Extremity Assessment Lower Extremity Assessment: Overall WFL for tasks assessed       Communication   Communication: No difficulties  Cognition Arousal/Alertness: Awake/alert Behavior During Therapy: WFL for tasks assessed/performed Overall Cognitive  Status: Within Functional Limits for tasks assessed                      General Comments      Exercises     Assessment/Plan    PT Assessment Patent does not need any further PT services  PT Problem List            PT Treatment Interventions      PT Goals (Current goals can be found in the Care Plan section)  Acute Rehab PT Goals Patient Stated Goal: go home    Frequency     Barriers to discharge        Co-evaluation               End of Session Equipment Utilized During Treatment: Gait belt;Oxygen Activity Tolerance: Patient tolerated treatment well;Patient limited by fatigue Patient left: with chair alarm set;with call bell/phone within  reach Nurse Communication: Mobility status         Time: 1610-9604 PT Time Calculation (min) (ACUTE ONLY): 23 min   Charges:   PT Evaluation $PT Eval Low Complexity: 1 Procedure     PT G Codes:        Malachi Pro, DPT 09/09/2016, 4:08 PM

## 2016-09-09 NOTE — ED Notes (Signed)
Pt transported to room 108 

## 2016-09-09 NOTE — Plan of Care (Signed)
Problem: Education: Goal: Knowledge of [place] [facility] Education information/materials will improve Outcome: Progressing Pt oriented to unit, call bell, bed alarm.  Bradycardic, hypoxic during shift.  Placed on 1LNC, satting 92%.  Flu negative.  Awaiting sputum sample.  Requested Ambien for sleep.  No other needs during shift.  Bed in low position, bed alarm on.  Call bell within reach, [place].

## 2016-09-09 NOTE — Progress Notes (Signed)
Admitted for sob/cough.bronchitis.still has lot of cough. Daughter mentioned that patient lives in  very  Old home,concerned about possible mold in the house.  Physical exam shows vitals are stable oxygen saturation 93% on 1 L.  Cardiovascular; S1-S2 regular  Lungs ;bilateral expiratory wheeze in all lung fields.  Abdomen ;sof,t nontender ,nondistended  Bowel sounds present,  Assessment and plan  Acute respiratory failure with hypoxia due to acute bronchitis: Any IV antibiotics, nebulizers, IV steroids. Patient has been getting sick since December of  2017, total concern about possible mold infection advised her to have the home checked with EPA,and patient  Can  possibly need skin testing as an out pt,. Time 20 min

## 2016-09-10 ENCOUNTER — Encounter: Payer: Self-pay | Admitting: Internal Medicine

## 2016-09-10 LAB — EXPECTORATED SPUTUM ASSESSMENT W GRAM STAIN, RFLX TO RESP C

## 2016-09-10 LAB — EXPECTORATED SPUTUM ASSESSMENT W REFEX TO RESP CULTURE: Special Requests: NORMAL

## 2016-09-10 NOTE — Progress Notes (Signed)
[employer]OUND [employer] - [place] at [facility]   PATIENT NAME: Abigail Strong    MR#:  [id number]  DATE OF BIRTH:  1929/11/11  SUBJECTIVE:  CHIEF COMPLAINT:   Chief Complaint  Patient presents with  . Shortness of Breath  . Weakness   Still has cough and SOB with wheezing. Feels very weak  REVIEW OF SYSTEMS:    Review of Systems  Constitutional: Positive for malaise/fatigue. Negative for chills and fever.  HENT: Negative for sore throat.   Eyes: Negative for blurred vision, double vision and pain.  Respiratory: Positive for cough, sputum production, shortness of breath and wheezing. Negative for hemoptysis.   Cardiovascular: Negative for chest pain, palpitations, orthopnea and leg swelling.  Gastrointestinal: Negative for abdominal pain, constipation, diarrhea, heartburn, nausea and vomiting.  Genitourinary: Negative for dysuria and hematuria.  Musculoskeletal: Negative for back pain and joint pain.  Skin: Negative for rash.  Neurological: Positive for weakness. Negative for sensory change, speech change, focal weakness and headaches.  Endo/Heme/Allergies: Does not bruise/bleed easily.  Psychiatric/Behavioral: Negative for depression. The patient is not nervous/anxious.    DRUG ALLERGIES:   Allergies  Allergen Reactions  . Penicillins Other (See Comments)    Dry throat Pt doesn't recall having any issues with penicillins  . Sulfonamide Derivatives    VITALS:  Blood pressure 140/83, pulse 77, temperature 97.6 F (36.4 C), temperature source Oral, resp. rate 20, height 5\' 5"  (1.651 m), weight 83.8 kg (184 lb 12.8 oz), SpO2 96 %.  PHYSICAL EXAMINATION:   Physical Exam  GENERAL:  83 y.o.-year-old patient lying in the bed EYES: Pupils equal, round, reactive to light and accommodation. No scleral icterus. Extraocular muscles intact.  HEENT: Head atraumatic, normocephalic. Oropharynx and nasopharynx clear.  NECK:  Supple, no jugular venous distention. No thyroid  enlargement, no tenderness.  LUNGS: Bilateral wheezing and decreased air entry. CARDIOVASCULAR: S1, S2 normal. No murmurs, rubs, or gallops.  ABDOMEN: Soft, nontender, nondistended. Bowel sounds present. No organomegaly or mass.  EXTREMITIES: No cyanosis, clubbing or edema b/l. NEUROLOGIC: Cranial nerves II through XII are intact. No focal Motor or sensory deficits b/l.   PSYCHIATRIC: The patient is alert and oriented x 3.  SKIN: No obvious rash, lesion, or ulcer.   LABORATORY PANEL:   CBC  Recent Labs Lab 09/09/16 0430  WBC 5.0  HGB 12.6  HCT 37.7  PLT 205   ------------------------------------------------------------------------------------------------------------------ Chemistries   Recent Labs Lab 09/09/16 0430  NA 140  K 4.2  CL 107  CO2 28  GLUCOSE 198*  BUN 16  CREATININE 0.78  CALCIUM 8.7*   ------------------------------------------------------------------------------------------------------------------  Cardiac Enzymes  Recent Labs Lab 09/08/16 1640  TROPONINI <0.03   ------------------------------------------------------------------------------------------------------------------  RADIOLOGY:  Dg Chest 2 View  Result Date: 09/08/2016 CLINICAL DATA:  Pt reports has been diagnosed with pneumonia and taken antibiotics and is not getting better. Pt reports SOB, weakness and cough EXAM: CHEST  2 VIEW COMPARISON:  01/17/2016 FINDINGS: Stable cardiac silhouette with ectatic aorta. No effusion, infiltrate pneumothorax. Kyphosis of the spine. IMPRESSION: No acute cardiopulmonary process. Electronically Signed   By: Genevive Bi M.D.   On: 09/08/2016 20:40     ASSESSMENT AND PLAN:   * Acute bronchitis with acute hypoxic respiratory failure -IV steroids, Antibiotics - Scheduled Nebulizers - Inhalers -Wean O2 as tolerated - Consult pulmonary if no improvement  * Hypothyroidism Continue home meds  * DVT prophylaxis with Lovenox   All the records  are reviewed and case discussed with Care Management/Social Workerr.  Management plans discussed with the patient, family and they are in agreement.  CODE STATUS: FULL CODE  DVT Prophylaxis: SCDs  TOTAL TIME TAKING CARE OF THIS PATIENT: 35 minutes.   POSSIBLE D/C IN 1-2 DAYS, DEPENDING ON CLINICAL CONDITION.  Milagros Loll R M.D on 09/10/2016 at 12:29 PM  Between 7am to 6pm - Pager - [phone]  After 6pm go to [email or web address] - password [email or web address] [facility]  [facility]OUND [place] [facility]  Office  [phone]  CC: Primary care physician; Corky Downs, MD  Note: This dictation was prepared with Dragon dictation along with smaller phrase technology. Any transcriptional errors that result from this process are unintentional.

## 2016-09-10 NOTE — Progress Notes (Signed)
Patinent c/o SOB. 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 Albuterol PRN q6h

## 2016-09-11 ENCOUNTER — Inpatient Hospital Stay: Payer: Medicare Other

## 2016-09-11 MED ORDER — METHYLPREDNISOLONE SODIUM SUCC 40 MG IJ SOLR
40.0000 mg | Freq: Two times a day (BID) | INTRAMUSCULAR | Status: DC
  Administered 2016-09-11 – 2016-09-12 (×3): 40 mg via INTRAVENOUS
  Filled 2016-09-11 (×3): qty 1

## 2016-09-11 NOTE — Care Management Important Message (Signed)
Important Message  Patient Details  Name: Abigail Strong MRN: [id number] Date of Birth: [birth year]   Medicare Important Message Given:  Yes    Shelbie Ammons, RN 09/11/2016, 8:30 AM

## 2016-09-11 NOTE — Progress Notes (Signed)
S[employer] Phys[employer] - [place] at [facility]   PATIENT NAME: Abigail Strong    MR#:  [id number]  DATE OF BIRTH:  1930-01-21  SUBJECTIVE:  CHIEF COMPLAINT:   Chief Complaint  Patient presents with  . Shortness of Breath  . Weakness   Still has cough, wheezing and shortness of breath. Off oxygen today. Afebrile.  REVIEW OF SYSTEMS:    Review of Systems  Constitutional: Positive for malaise/fatigue. Negative for chills and fever.  HENT: Negative for sore throat.   Eyes: Negative for blurred vision, double vision and pain.  Respiratory: Positive for cough, sputum production, shortness of breath and wheezing. Negative for hemoptysis.   Cardiovascular: Negative for chest pain, palpitations, orthopnea and leg swelling.  Gastrointestinal: Negative for abdominal pain, constipation, diarrhea, heartburn, nausea and vomiting.  Genitourinary: Negative for dysuria and hematuria.  Musculoskeletal: Negative for back pain and joint pain.  Skin: Negative for rash.  Neurological: Positive for weakness. Negative for sensory change, speech change, focal weakness and headaches.  Endo/Heme/Allergies: Does not bruise/bleed easily.  Psychiatric/Behavioral: Negative for depression. The patient is not nervous/anxious.    DRUG ALLERGIES:   Allergies  Allergen Reactions  . Penicillins Other (See Comments)    Dry throat Pt doesn't recall having any issues with penicillins  . Sulfonamide Derivatives    VITALS:  Blood pressure 132/60, pulse 74, temperature 97.6 F (36.4 C), temperature source Oral, resp. rate 18, height 5\' 5"  (1.651 m), weight 83.8 kg (184 lb 12.8 oz), SpO2 95 %.  PHYSICAL EXAMINATION:   Physical Exam  GENERAL:  83 y.o.-year-old patient lying in the bed EYES: Pupils equal, round, reactive to light and accommodation. No scleral icterus. Extraocular muscles intact.  HEENT: Head atraumatic, normocephalic. Oropharynx and nasopharynx clear.  NECK:  Supple, no jugular venous  distention. No thyroid enlargement, no tenderness.  LUNGS: Bilateral wheezing CARDIOVASCULAR: S1, S2 normal. No murmurs, rubs, or gallops.  ABDOMEN: Soft, nontender, nondistended. Bowel sounds present. No organomegaly or mass.  EXTREMITIES: No cyanosis, clubbing or edema b/l. NEUROLOGIC: Cranial nerves II through XII are intact. No focal Motor or sensory deficits b/l.   PSYCHIATRIC: The patient is alert and oriented x 3.  SKIN: No obvious rash, lesion, or ulcer.   LABORATORY PANEL:   CBC  Recent Labs Lab 09/09/16 0430  WBC 5.0  HGB 12.6  HCT 37.7  PLT 205   ------------------------------------------------------------------------------------------------------------------ Chemistries   Recent Labs Lab 09/09/16 0430  NA 140  K 4.2  CL 107  CO2 28  GLUCOSE 198*  BUN 16  CREATININE 0.78  CALCIUM 8.7*   ------------------------------------------------------------------------------------------------------------------  Cardiac Enzymes  Recent Labs Lab 09/08/16 1640  TROPONINI <0.03   ------------------------------------------------------------------------------------------------------------------  RADIOLOGY:  Ct Chest Wo Contrast  Result Date: 09/11/2016 CLINICAL DATA:  Chronic cough. EXAM: CT CHEST WITHOUT CONTRAST TECHNIQUE: Multidetector CT imaging of the chest was performed following the standard protocol without IV contrast. COMPARISON:  CT scan February 22, 2015. FINDINGS: Cardiovascular: Atherosclerosis of thoracic aorta is noted without aneurysm. Coronary artery calcifications are noted. Normal cardiac size. No pericardial effusion. Mediastinum/Nodes: No enlarged mediastinal or axillary lymph nodes. Thyroid gland, trachea, and esophagus demonstrate no significant findings. Moderate size sliding-type hiatal hernia is noted. Lungs/Pleura: No pneumothorax or pleural effusion is noted. Stable 3 mm nodule is noted in right upper lobe best seen on image number 52 of series 3.  Stable 3 mm nodule is noted in right lower lobe best seen on image number 59 of series 3. No new nodules or masses  are noted. Upper Abdomen: No acute abnormality. Musculoskeletal: No chest wall mass or suspicious bone lesions identified. IMPRESSION: Aortic atherosclerosis. Coronary artery calcifications are noted suggesting coronary artery disease. Stable 3 mm nodules are noted in right lung compared to prior exam. Given the lack of change, these can be considered benign at this point with no further follow-up required. Moderate size sliding-type hiatal hernia. No other significant abnormality seen in the chest. Electronically Signed   By: Lupita Raider, M.D.   On: 09/11/2016 11:58     ASSESSMENT AND PLAN:   * Acute bronchitis with acute hypoxic respiratory failure -IV steroids, Antibiotics - Scheduled Nebulizers - Inhalers - Off oxygen today - Consult pulmonary. Patient has seen Dr. Meredeth Ide in the past - We'll check a CT scan of her chest  * Hypothyroidism Continue home meds  * DVT prophylaxis with Lovenox  Discussed with daughter Jasmine December over the phone.  CODE STATUS: FULL CODE  DVT Prophylaxis: SCDs  TOTAL TIME TAKING CARE OF THIS PATIENT: 35 minutes.   POSSIBLE D/C IN 1-2 DAYS, DEPENDING ON CLINICAL CONDITION.  Milagros Loll R M.D on 09/11/2016 at 1:06 PM  Between 7am to 6pm - Pager - [phone]  After 6pm go to [email or web address] - password [email or web address] [facility]  [facility] [place] [facility]  Office  [phone]  CC: Primary care physician; Corky Downs, MD  Note: This dictation was prepared with Dragon dictation along with smaller phrase technology. Any transcriptional errors that result from this process are unintentional.

## 2016-09-12 LAB — CBC
HCT: 38.1 % (ref 35.0–47.0)
Hemoglobin: 12.6 g/dL (ref 12.0–16.0)
MCH: 29.2 pg (ref 26.0–34.0)
MCHC: 33 g/dL (ref 32.0–36.0)
MCV: 88.4 fL (ref 80.0–100.0)
Platelets: 219 10*3/uL (ref 150–440)
RBC: 4.31 MIL/uL (ref 3.80–5.20)
RDW: 14.9 % — ABNORMAL HIGH (ref 11.5–14.5)
WBC: 13 10*3/uL — ABNORMAL HIGH (ref 3.6–11.0)

## 2016-09-12 LAB — CULTURE, RESPIRATORY: Culture: NORMAL

## 2016-09-12 LAB — CULTURE, RESPIRATORY W GRAM STAIN: Special Requests: NORMAL

## 2016-09-12 LAB — CREATININE, SERUM
Creatinine, Ser: 0.75 mg/dL (ref 0.44–1.00)
GFR calc Af Amer: >60 mL/min (ref >60)
GFR calc non Af Amer: >60 mL/min (ref >60)

## 2016-09-12 MED ORDER — BUDESONIDE 0.25 MG/2ML IN SUSP
0.2500 mg | Freq: Two times a day (BID) | RESPIRATORY_TRACT | Status: DC
  Administered 2016-09-12 – 2016-09-14 (×5): 0.25 mg via RESPIRATORY_TRACT
  Filled 2016-09-12 (×5): qty 2
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
## 2016-09-12 NOTE — Progress Notes (Signed)
S[employer] Phys[employer] - [place] at [facility]   PATIENT NAME: Abigail Strong    MR#:  [id number]  DATE OF BIRTH:  1930-06-05  SUBJECTIVE:  CHIEF COMPLAINT:   Chief Complaint  Patient presents with  . Shortness of Breath  . Weakness   Continues to have wheezing and shortness of breath. Thick sputum. Afebrile  REVIEW OF SYSTEMS:    Review of Systems  Constitutional: Positive for malaise/fatigue. Negative for chills and fever.  HENT: Negative for sore throat.   Eyes: Negative for blurred vision, double vision and pain.  Respiratory: Positive for cough, sputum production, shortness of breath and wheezing. Negative for hemoptysis.   Cardiovascular: Negative for chest pain, palpitations, orthopnea and leg swelling.  Gastrointestinal: Negative for abdominal pain, constipation, diarrhea, heartburn, nausea and vomiting.  Genitourinary: Negative for dysuria and hematuria.  Musculoskeletal: Negative for back pain and joint pain.  Skin: Negative for rash.  Neurological: Positive for weakness. Negative for sensory change, speech change, focal weakness and headaches.  Endo/Heme/Allergies: Does not bruise/bleed easily.  Psychiatric/Behavioral: Negative for depression. The patient is not nervous/anxious.    DRUG ALLERGIES:   Allergies  Allergen Reactions  . Penicillins Other (See Comments)    Dry throat Pt doesn't recall having any issues with penicillins  . Sulfonamide Derivatives    VITALS:  Blood pressure (!) 141/69, pulse 68, temperature 97.6 F (36.4 C), temperature source Oral, resp. rate 18, height 5\' 5"  (1.651 m), weight 83.8 kg (184 lb 12.8 oz), SpO2 91 %.  PHYSICAL EXAMINATION:   Physical Exam  GENERAL:  83 y.o.-year-old patient lying in the bed EYES: Pupils equal, round, reactive to light and accommodation. No scleral icterus. Extraocular muscles intact.  HEENT: Head atraumatic, normocephalic. Oropharynx and nasopharynx clear.  NECK:  Supple, no jugular venous  distention. No thyroid enlargement, no tenderness.  LUNGS: Bilateral wheezing and decreased air entry. CARDIOVASCULAR: S1, S2 normal. No murmurs, rubs, or gallops.  ABDOMEN: Soft, nontender, nondistended. Bowel sounds present. No organomegaly or mass.  EXTREMITIES: No cyanosis, clubbing or edema b/l. NEUROLOGIC: Cranial nerves II through XII are intact. No focal Motor or sensory deficits b/l.   PSYCHIATRIC: The patient is alert and oriented x 3.  SKIN: No obvious rash, lesion, or ulcer.   LABORATORY PANEL:   CBC  Recent Labs Lab 09/12/16 0424  WBC 13.0*  HGB 12.6  HCT 38.1  PLT 219   ------------------------------------------------------------------------------------------------------------------ Chemistries   Recent Labs Lab 09/09/16 0430 09/12/16 0424  NA 140  --   K 4.2  --   CL 107  --   CO2 28  --   GLUCOSE 198*  --   BUN 16  --   CREATININE 0.78 0.75  CALCIUM 8.7*  --    ------------------------------------------------------------------------------------------------------------------  Cardiac Enzymes  Recent Labs Lab 09/08/16 1640  TROPONINI <0.03   ------------------------------------------------------------------------------------------------------------------  RADIOLOGY:  Ct Chest Wo Contrast  Result Date: 09/11/2016 CLINICAL DATA:  Chronic cough. EXAM: CT CHEST WITHOUT CONTRAST TECHNIQUE: Multidetector CT imaging of the chest was performed following the standard protocol without IV contrast. COMPARISON:  CT scan February 22, 2015. FINDINGS: Cardiovascular: Atherosclerosis of thoracic aorta is noted without aneurysm. Coronary artery calcifications are noted. Normal cardiac size. No pericardial effusion. Mediastinum/Nodes: No enlarged mediastinal or axillary lymph nodes. Thyroid gland, trachea, and esophagus demonstrate no significant findings. Moderate size sliding-type hiatal hernia is noted. Lungs/Pleura: No pneumothorax or pleural effusion is noted. Stable 3  mm nodule is noted in right upper lobe best seen on image number  52 of series 3. Stable 3 mm nodule is noted in right lower lobe best seen on image number 59 of series 3. No new nodules or masses are noted. Upper Abdomen: No acute abnormality. Musculoskeletal: No chest wall mass or suspicious bone lesions identified. IMPRESSION: Aortic atherosclerosis. Coronary artery calcifications are noted suggesting coronary artery disease. Stable 3 mm nodules are noted in right lung compared to prior exam. Given the lack of change, these can be considered benign at this point with no further follow-up required. Moderate size sliding-type hiatal hernia. No other significant abnormality seen in the chest. Electronically Signed   By: Lupita Raider, M.D.   On: 09/11/2016 11:58     ASSESSMENT AND PLAN:   * Acute bronchitis with acute hypoxic respiratory failure -IV steroids, Antibiotics - Scheduled Nebulizers - Inhalers - Consult pulmonary. Discussed with Dr. Meredeth Ide CT scan of the chest showed nothing acute  * Hypothyroidism Continue home meds  * DVT prophylaxis with Lovenox   All the records are reviewed and case discussed with Care Management/Social Workerr. Management plans discussed with the patient, family and they are in agreement.  CODE STATUS: FULL CODE  DVT Prophylaxis: SCDs  TOTAL TIME TAKING CARE OF THIS PATIENT: 35 minutes.   POSSIBLE D/C IN 1-2 DAYS, DEPENDING ON CLINICAL CONDITION.  Milagros Loll R M.D on 09/12/2016 at 1:13 PM  Between 7am to 6pm - Pager - [phone]  After 6pm go to [email or web address] - password [email or web address] [facility]  S[facility] [place] [facility]  Office  [phone]  CC: Primary care physician; Corky Downs, MD  Note: This dictation was prepared with Dragon dictation along with smaller phrase technology. Any transcriptional errors that result from this process are unintentional.

## 2016-09-13 MED ORDER — PREDNISONE 50 MG PO TABS
50.0000 mg | ORAL_TABLET | Freq: Every day | ORAL | Status: DC
  Administered 2016-09-13 – 2016-09-14 (×2): 50 mg via ORAL
  Filled 2016-09-13 (×2): qty 1

## 2016-09-13 MED ORDER — HYDROCOD POLST-CPM POLST ER 10-8 MG/5ML PO SUER
5.0000 mL | Freq: Two times a day (BID) | ORAL | Status: DC
  Administered 2016-09-13 – 2016-09-14 (×3): 5 mL via ORAL
  Filled 2016-09-13 (×3): qty 5
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
## 2016-09-13 NOTE — Progress Notes (Signed)
[facility] - [place] at [facility]   PATIENT NAME: Abigail Strong    MR#:  [id number]  DATE OF BIRTH:  08/14/1930  SUBJECTIVE:  CHIEF COMPLAINT:   Chief Complaint  Patient presents with  . Shortness of Breath  . Weakness   Continues to have wheezing and shortness of breath. Thick sputum. Afebrile. Not on oxygen.  Feels weak.  REVIEW OF SYSTEMS:    Review of Systems  Constitutional: Positive for malaise/fatigue. Negative for chills and fever.  HENT: Negative for sore throat.   Eyes: Negative for blurred vision, double vision and pain.  Respiratory: Positive for cough, sputum production, shortness of breath and wheezing. Negative for hemoptysis.   Cardiovascular: Negative for chest pain, palpitations, orthopnea and leg swelling.  Gastrointestinal: Negative for abdominal pain, constipation, diarrhea, heartburn, nausea and vomiting.  Genitourinary: Negative for dysuria and hematuria.  Musculoskeletal: Negative for back pain and joint pain.  Skin: Negative for rash.  Neurological: Positive for weakness. Negative for sensory change, speech change, focal weakness and headaches.  Endo/Heme/Allergies: Does not bruise/bleed easily.  Psychiatric/Behavioral: Negative for depression. The patient is not nervous/anxious.    DRUG ALLERGIES:   Allergies  Allergen Reactions  . Penicillins Other (See Comments)    Dry throat Pt doesn't recall having any issues with penicillins  . Sulfonamide Derivatives    VITALS:  Blood pressure 108/66, pulse 73, temperature 98.2 F (36.8 C), resp. rate 20, height 5\' 5"  (1.651 m), weight 83.8 kg (184 lb 12.8 oz), SpO2 92 %.  PHYSICAL EXAMINATION:   Physical Exam  GENERAL:  83 y.o.-year-old patient lying in the bed EYES: Pupils equal, round, reactive to light and accommodation. No scleral icterus. Extraocular muscles intact.  HEENT: Head atraumatic, normocephalic. Oropharynx and nasopharynx clear.  NECK:  Supple, no jugular  venous distention. No thyroid enlargement, no tenderness.  LUNGS: Bilateral wheezing and decreased air entry. CARDIOVASCULAR: S1, S2 normal. No murmurs, rubs, or gallops.  ABDOMEN: Soft, nontender, nondistended. Bowel sounds present. No organomegaly or mass.  EXTREMITIES: No cyanosis, clubbing or edema b/l. NEUROLOGIC: Cranial nerves II through XII are intact. No focal Motor or sensory deficits b/l.   PSYCHIATRIC: The patient is alert and oriented x 3.  SKIN: No obvious rash, lesion, or ulcer.   LABORATORY PANEL:   CBC  Recent Labs Lab 09/12/16 0424  WBC 13.0*  HGB 12.6  HCT 38.1  PLT 219   ------------------------------------------------------------------------------------------------------------------ Chemistries   Recent Labs Lab 09/09/16 0430 09/12/16 0424  NA 140  --   K 4.2  --   CL 107  --   CO2 28  --   GLUCOSE 198*  --   BUN 16  --   CREATININE 0.78 0.75  CALCIUM 8.7*  --    ------------------------------------------------------------------------------------------------------------------  Cardiac Enzymes  Recent Labs Lab 09/08/16 1640  TROPONINI <0.03   ------------------------------------------------------------------------------------------------------------------  RADIOLOGY:  No results found.   ASSESSMENT AND PLAN:   * Acute bronchitis with acute hypoxic respiratory failure -IV steroids, Antibiotics - Scheduled Nebulizers - Inhalers - Consulted pulmonary. Discussed with Dr. Meredeth Ide - CT scan of the chest showed nothing acute  * Hypothyroidism Continue home meds  * DVT prophylaxis with Lovenox  All the records are reviewed and case discussed with Care Management/Social Workerr. Management plans discussed with the patient, family and they are in agreement.  CODE STATUS: FULL CODE  DVT Prophylaxis: SCDs  TOTAL TIME TAKING CARE OF THIS PATIENT: 35 minutes.   POSSIBLE D/C IN 1-2 DAYS, DEPENDING ON CLINICAL  CONDITION.  Milagros Loll  R M.D on 09/13/2016 at 1:07 PM  Between 7am to 6pm - Pager - [phone]  After 6pm go to [email or web address] - password [email or web address] [facility]  [facility] [place] [facility]  Office  [phone]  CC: Primary care physician; Corky Downs, MD  Note: This dictation was prepared with Dragon dictation along with smaller phrase technology. Any transcriptional errors that result from this process are unintentional.

## 2016-09-13 NOTE — Care Management (Addendum)
Admitted to [facility] with the diagnosis of bronchitis. Lives with daughter, Abigail Strong [phone] or [phone]). Seen Abigail Strong last Monday. Seen Abigail Strong while in hospital. Rolling walker, rollayor, cane, and wheelchair if needed. No home health. Skilled Nursing 19 years ago. No home oxygen. CPAP in the home x 1 year. Prescriptions are filled at [facility] on [address]. Takes care of all basic activities of daily living herself, drives if needed.  Fell x 3 since Christmas. Appetite too good.  Physical therapy evaluation completed. No physical therapy services recommended.  List of personal care service agencies and Med Alert information given to Abigail Strong. Will call daughter to discuss services.  Possible discharge tomorrow per Dr. Oleh Genin RN MSN CCM Care Management

## 2016-09-14 MED ORDER — PREDNISONE 10 MG (21) PO TBPK: 10.0000 mg | ORAL_TABLET | Freq: Every day | ORAL | 0 refills | Status: DC

## 2016-09-14 MED ORDER — ALBUTEROL SULFATE (2.5 MG/3ML) 0.083% IN NEBU: 2.5000 mg | INHALATION_SOLUTION | Freq: Four times a day (QID) | RESPIRATORY_TRACT | 12 refills | Status: DC | PRN

## 2016-09-14 MED ORDER — GUAIFENESIN ER 600 MG PO TB12: 600.0000 mg | ORAL_TABLET | Freq: Two times a day (BID) | ORAL | 0 refills | Status: DC

## 2016-09-14 MED ORDER — HYDROCOD POLST-CPM POLST ER 10-8 MG/5ML PO SUER: 5.0000 mL | Freq: Two times a day (BID) | ORAL | 0 refills | Status: DC

## 2016-09-14 NOTE — Care Management (Signed)
Discharge to home today per Dr. Darvin Neighbours. Ms. Vanalstine wants a bedside commode. Insurance will not pay for bedside commode. ($52.00) Telephone call to daughter. States that her sister has a commode that her mother can use, A nebulizer will be provided by [facility].                                                                                                              Shelbie Ammons RN MSN CCM Care Management

## 2016-09-14 NOTE — Care Management Important Message (Signed)
Important Message  Patient Details  Name: MERDIS SLATE MRN: [id number] Date of Birth: 05/20/1930   Medicare Important Message Given:  Yes    Shelbie Ammons, RN 09/14/2016, 7:52 AM

## 2016-09-14 NOTE — Discharge Instructions (Signed)
Resume diet and activity as before ° ° °

## 2016-09-18 NOTE — Discharge Summary (Signed)
[facility] - [place] at [facility]   PATIENT NAME: Abigail Strong    MR#:  [id number]  DATE OF BIRTH:  09/28/1929  DATE OF ADMISSION:  09/08/2016 ADMITTING PHYSICIAN: Tonye Royalty, DO  DATE OF DISCHARGE: 09/14/2016  2:26 PM  PRIMARY CARE PHYSICIAN: MASOUD,JAVED, MD   ADMISSION DIAGNOSIS:  Shortness of breath [R06.02] Generalized weakness [R53.1] Bronchospasm, acute [J98.01]  DISCHARGE DIAGNOSIS:  Active Problems:   Bronchitis   SECONDARY DIAGNOSIS:   Past Medical History:  Diagnosis Date  . Depression   . GERD (gastroesophageal reflux disease)   . Hyperlipidemia   . Hypothyroidism   . Insomnia      ADMITTING HISTORY  Chief Complaint: SOB  HPI: Abigail Strong is a 83 y.o. female with a known history of hypothyroidism, hyperlipidemia, hypertension, GERD, depression was in a usual state of health until about one month ago when she describes flulike symptoms. She was treated conservatively. More recently she was seen by urgent care and was prescribed antibiotics for symptoms of productive cough, generalized fatigue, malaise, chills. Her symptoms have worsened despite outpatient treatment. She now complains of persistent cough productive of clear to yellow sputum, shortness of breath, wheezing and generalized weakness..   Otherwise there has been no change in status. Patient has been taking medication as prescribed and there has been no recent change in medication or diet.  There has been no recent hospitalizations, travel or sick contacts.    Patient denies fevers, dizziness, chest pain, N/V/C/D, abdominal pain, dysuria/frequency, changes in mental status.   ED Course: Patient received DuoNeb 3 and Solu-Medrol  HOSPITAL COURSE:   * Acute bronchitis with acute hypoxic respiratory failure Patient had a repeat chest x-ray followed by a CT scan of the chest in the hospital which showed nothing acute. Sputum cultures negative. Seen by Dr. Meredeth Ide of  pulmonary who suggested continuing the treatment as we were doing. Treat with IV steroids, nebulizers and inhalers along with antibiotics. Patient slowly improved. She has not returned to her baseline but has received maximal treatment in the hospital. Has mild wheezing at discharge. She will remain on oral prednisone taper along with nebulizers and inhalers at home.  Follow-up with pulmonary and primary care physician in one to 2 weeks.  Influenza and sputum cultures negative  * Hypothyroidism Continue home meds  Discharge home.  CONSULTS OBTAINED:  Treatment Team:  Mertie Moores, MD  DRUG ALLERGIES:   Allergies  Allergen Reactions  . Penicillins Other (See Comments)    Dry throat Pt doesn't recall having any issues with penicillins  . Sulfonamide Derivatives     DISCHARGE MEDICATIONS:   Discharge Medication List as of 09/14/2016 10:35 AM    START taking these medications   Details  albuterol (PROVENTIL) (2.5 MG/3ML) 0.083% nebulizer solution Take 3 mLs (2.5 mg total) by nebulization every 6 (six) hours as needed for wheezing or shortness of breath., Starting Fri 09/14/2016, Normal    chlorpheniramine-HYDROcodone (TUSSIONEX) 10-8 MG/5ML SUER Take 5 mLs by mouth 2 (two) times daily., Starting Fri 09/14/2016, Normal    guaiFENesin (MUCINEX) 600 MG 12 hr tablet Take 1 tablet (600 mg total) by mouth 2 (two) times daily., Starting Fri 09/14/2016, Normal    predniSONE (STERAPRED UNI-PAK 21 TAB) 10 MG (21) TBPK tablet Take 1 tablet (10 mg total) by mouth daily. 60 mg day 1. Taper by 10 mg daily for total 6 days., Starting Fri 09/14/2016, Normal      CONTINUE these medications which have NOT CHANGED  Details  acetaminophen (TYLENOL) 500 MG tablet Take 1,000 mg by mouth every 6 (six) hours as needed., Historical Med    aspirin EC 81 MG tablet Take 81 mg by mouth daily., Historical Med    citalopram (CELEXA) 20 MG tablet Take 20 mg by mouth daily., Historical Med     levothyroxine (SYNTHROID, LEVOTHROID) 125 MCG tablet Take 125 mcg by mouth daily before breakfast., Historical Med    omeprazole (PRILOSEC) 40 MG capsule Take 40 mg by mouth 2 (two) times daily., Historical Med    pravastatin (PRAVACHOL) 40 MG tablet Take 40 mg by mouth at bedtime., Historical Med    Ascorbic Acid (VITAMIN C) 1000 MG tablet Take 1,000 mg by mouth daily., Historical Med    Cholecalciferol (VITAMIN D3) 5000 UNITS CAPS Take 5,000 Units by mouth daily., Historical Med    Ginkgo Biloba 120 MG CAPS Take 120 mg by mouth daily., Historical Med    meclizine (ANTIVERT) 12.5 MG tablet Take 12.5 mg by mouth 2 (two) times daily., Historical Med    Multiple Vitamins-Minerals (CENTRUM SILVER ADULT 50+) TABS Take 1 tablet by mouth daily., Historical Med    traZODone (DESYREL) 50 MG tablet Take 50 mg by mouth at bedtime., Historical Med    zolpidem (AMBIEN) 10 MG tablet Take 5 mg by mouth at bedtime as needed for sleep., Historical Med        Today   VITAL SIGNS:  Blood pressure 126/73, pulse 79, temperature 97.4 F (36.3 C), temperature source Oral, resp. rate 20, height 5\' 5"  (1.651 m), weight 83.8 kg (184 lb 12.8 oz), SpO2 94 %.  I/O:  No intake or output data in the 24 hours ending 09/18/16 1622  PHYSICAL EXAMINATION:  Physical Exam  GENERAL:  83 y.o.-year-old patient lying in the bed with no acute distress.  LUNGS: normal work of breathing. Mild expiratory wheezing. CARDIOVASCULAR: S1, S2 normal. No murmurs, rubs, or gallops.  ABDOMEN: Soft, non-tender, non-distended. Bowel sounds present. No organomegaly or mass.  NEUROLOGIC: Moves all 4 extremities. PSYCHIATRIC: The patient is alert and oriented x 3.  SKIN: No obvious rash, lesion, or ulcer.   DATA REVIEW:   CBC  Recent Labs Lab 09/12/16 0424  WBC 13.0*  HGB 12.6  HCT 38.1  PLT 219    Chemistries   Recent Labs Lab 09/12/16 0424  CREATININE 0.75    Cardiac Enzymes No results for input(s):  TROPONINI in the last 168 hours.  Microbiology Results  Results for orders placed or performed during the hospital encounter of 09/08/16  Culture, sputum-assessment     Status: None   Collection Time: 09/09/16 11:10 AM  Result Value Ref Range Status   Specimen Description Expect. Sput  Final   Special Requests Normal  Final   Sputum evaluation THIS SPECIMEN IS ACCEPTABLE FOR SPUTUM CULTURE  Final   Report Status 09/10/2016 FINAL  Final  Culture, respiratory (NON-Expectorated)     Status: None   Collection Time: 09/09/16 11:10 AM  Result Value Ref Range Status   Specimen Description Expect. Sput  Final   Special Requests Normal Reflexed from [id number]  Final   Gram Stain   Final    ABUNDANT WBC PRESENT,BOTH PMN AND MONONUCLEAR ABUNDANT GRAM POSITIVE COCCI IN PAIRS FEW GRAM NEGATIVE COCCI IN PAIRS FEW GRAM POSITIVE RODS FEW YEAST    Culture   Final    Consistent with normal respiratory flora. Performed at [facility], [address], [place], [place] [address]  Report Status 09/12/2016 FINAL  Final    RADIOLOGY:  No results found.  Follow up with PCP in 1 week.  Management plans discussed with the patient, family and they are in agreement.  CODE STATUS:  Code Status History    Date Active Date Inactive Code Status Order ID Comments User Context   09/09/2016  1:30 AM 09/14/2016  5:32 PM Full Code [id number]  Tonye Royalty, DO Inpatient   03/23/2015  2:45 AM 03/25/2015  1:28 PM Full Code [id number]  Arnaldo Natal, MD Inpatient      TOTAL TIME TAKING CARE OF THIS PATIENT ON DAY OF DISCHARGE: more than 30 minutes.   Milagros Loll R M.D on 09/18/2016 at 4:22 PM  Between 7am to 6pm - Pager - [phone]  After 6pm go to [email or web address] - password [email or web address] [facility]  [facility] [place] [facility]  Office  [phone]  CC: Primary care physician; Corky Downs, MD  Note: This dictation was prepared with Dragon dictation along with smaller phrase technology. Any  transcriptional errors that result from this process are unintentional.

## 2016-09-21 DIAGNOSIS — G451 Carotid artery syndrome (hemispheric): Secondary | ICD-10-CM | POA: Diagnosis not present

## 2016-09-21 DIAGNOSIS — J209 Acute bronchitis, unspecified: Secondary | ICD-10-CM | POA: Diagnosis not present

## 2016-09-21 DIAGNOSIS — I495 Sick sinus syndrome: Secondary | ICD-10-CM | POA: Diagnosis not present

## 2016-09-21 DIAGNOSIS — R61 Generalized hyperhidrosis: Secondary | ICD-10-CM | POA: Diagnosis not present

## 2016-09-22 ENCOUNTER — Emergency Department: Payer: Medicare Other

## 2016-09-22 ENCOUNTER — Inpatient Hospital Stay: Payer: Medicare Other

## 2016-09-22 ENCOUNTER — Encounter: Payer: Self-pay | Admitting: Emergency Medicine

## 2016-09-22 ENCOUNTER — Inpatient Hospital Stay
Admission: EM | Admit: 2016-09-22 | Discharge: 2016-09-27 | DRG: 193 | Disposition: A | Discharge status: Home Health | Payer: Medicare Other | Attending: Internal Medicine | Admitting: Internal Medicine

## 2016-09-22 DIAGNOSIS — I10 Essential (primary) hypertension: Secondary | ICD-10-CM | POA: Diagnosis present

## 2016-09-22 DIAGNOSIS — R509 Fever, unspecified: Secondary | ICD-10-CM

## 2016-09-22 DIAGNOSIS — J986 Disorders of diaphragm: Secondary | ICD-10-CM | POA: Diagnosis present

## 2016-09-22 DIAGNOSIS — F329 Major depressive disorder, single episode, unspecified: Secondary | ICD-10-CM | POA: Diagnosis present

## 2016-09-22 DIAGNOSIS — Z79899 Other long term (current) drug therapy: Secondary | ICD-10-CM | POA: Diagnosis not present

## 2016-09-22 DIAGNOSIS — J441 Chronic obstructive pulmonary disease with (acute) exacerbation: Secondary | ICD-10-CM

## 2016-09-22 DIAGNOSIS — R0602 Shortness of breath: Secondary | ICD-10-CM

## 2016-09-22 DIAGNOSIS — G4730 Sleep apnea, unspecified: Secondary | ICD-10-CM | POA: Diagnosis present

## 2016-09-22 DIAGNOSIS — R000 Tachycardia, unspecified: Secondary | ICD-10-CM | POA: Diagnosis not present

## 2016-09-22 DIAGNOSIS — K219 Gastro-esophageal reflux disease without esophagitis: Secondary | ICD-10-CM | POA: Diagnosis not present

## 2016-09-22 DIAGNOSIS — R911 Solitary pulmonary nodule: Secondary | ICD-10-CM | POA: Diagnosis present

## 2016-09-22 DIAGNOSIS — E785 Hyperlipidemia, unspecified: Secondary | ICD-10-CM | POA: Diagnosis not present

## 2016-09-22 DIAGNOSIS — Z882 Allergy status to sulfonamides status: Secondary | ICD-10-CM | POA: Diagnosis not present

## 2016-09-22 DIAGNOSIS — R296 Repeated falls: Secondary | ICD-10-CM

## 2016-09-22 DIAGNOSIS — Z9049 Acquired absence of other specified parts of digestive tract: Secondary | ICD-10-CM

## 2016-09-22 DIAGNOSIS — Z7982 Long term (current) use of aspirin: Secondary | ICD-10-CM

## 2016-09-22 DIAGNOSIS — H6690 Otitis media, unspecified, unspecified ear: Secondary | ICD-10-CM | POA: Diagnosis not present

## 2016-09-22 DIAGNOSIS — J101 Influenza due to other identified influenza virus with other respiratory manifestations: Secondary | ICD-10-CM

## 2016-09-22 DIAGNOSIS — R0789 Other chest pain: Secondary | ICD-10-CM | POA: Diagnosis not present

## 2016-09-22 DIAGNOSIS — D72829 Elevated white blood cell count, unspecified: Secondary | ICD-10-CM

## 2016-09-22 DIAGNOSIS — W19XXXA Unspecified fall, initial encounter: Secondary | ICD-10-CM | POA: Diagnosis present

## 2016-09-22 DIAGNOSIS — J189 Pneumonia, unspecified organism: Secondary | ICD-10-CM | POA: Diagnosis not present

## 2016-09-22 DIAGNOSIS — J1000 Influenza due to other identified influenza virus with unspecified type of pneumonia: Secondary | ICD-10-CM | POA: Diagnosis not present

## 2016-09-22 DIAGNOSIS — E871 Hypo-osmolality and hyponatremia: Secondary | ICD-10-CM | POA: Diagnosis not present

## 2016-09-22 DIAGNOSIS — J9601 Acute respiratory failure with hypoxia: Secondary | ICD-10-CM | POA: Diagnosis present

## 2016-09-22 DIAGNOSIS — R079 Chest pain, unspecified: Secondary | ICD-10-CM | POA: Diagnosis not present

## 2016-09-22 DIAGNOSIS — J440 Chronic obstructive pulmonary disease with acute lower respiratory infection: Secondary | ICD-10-CM | POA: Diagnosis not present

## 2016-09-22 DIAGNOSIS — R739 Hyperglycemia, unspecified: Secondary | ICD-10-CM | POA: Diagnosis present

## 2016-09-22 DIAGNOSIS — R531 Weakness: Secondary | ICD-10-CM

## 2016-09-22 DIAGNOSIS — E039 Hypothyroidism, unspecified: Secondary | ICD-10-CM | POA: Diagnosis present

## 2016-09-22 DIAGNOSIS — J209 Acute bronchitis, unspecified: Secondary | ICD-10-CM | POA: Diagnosis present

## 2016-09-22 DIAGNOSIS — J111 Influenza due to unidentified influenza virus with other respiratory manifestations: Secondary | ICD-10-CM

## 2016-09-22 DIAGNOSIS — R0600 Dyspnea, unspecified: Secondary | ICD-10-CM

## 2016-09-22 DIAGNOSIS — R479 Unspecified speech disturbances: Secondary | ICD-10-CM

## 2016-09-22 DIAGNOSIS — Z880 Allergy status to penicillin: Secondary | ICD-10-CM | POA: Diagnosis not present

## 2016-09-22 DIAGNOSIS — R0902 Hypoxemia: Secondary | ICD-10-CM | POA: Diagnosis not present

## 2016-09-22 DIAGNOSIS — R6889 Other general symptoms and signs: Secondary | ICD-10-CM | POA: Diagnosis not present

## 2016-09-22 DIAGNOSIS — R05 Cough: Secondary | ICD-10-CM | POA: Diagnosis not present

## 2016-09-22 DIAGNOSIS — R262 Difficulty in walking, not elsewhere classified: Secondary | ICD-10-CM

## 2016-09-22 DIAGNOSIS — R7303 Prediabetes: Secondary | ICD-10-CM

## 2016-09-22 DIAGNOSIS — R5382 Chronic fatigue, unspecified: Secondary | ICD-10-CM

## 2016-09-22 LAB — INFLUENZA PANEL BY PCR (TYPE A & B)
Influenza A By PCR: POSITIVE — AB
Influenza B By PCR: NEGATIVE

## 2016-09-22 LAB — CBC WITH DIFFERENTIAL/PLATELET
Basophils Absolute: 0.1 10*3/uL (ref 0–0.1)
Basophils Relative: 1 %
Eosinophils Absolute: 0 10*3/uL (ref 0–0.7)
Eosinophils Relative: 0 %
HCT: 44 % (ref 35.0–47.0)
Hemoglobin: 14.8 g/dL (ref 12.0–16.0)
Lymphocytes Relative: 3 %
Lymphs Abs: 0.5 10*3/uL — ABNORMAL LOW (ref 1.0–3.6)
MCH: 29.4 pg (ref 26.0–34.0)
MCHC: 33.6 g/dL (ref 32.0–36.0)
MCV: 87.4 fL (ref 80.0–100.0)
Monocytes Absolute: 0.7 10*3/uL (ref 0.2–0.9)
Monocytes Relative: 4 %
Neutro Abs: 16.6 10*3/uL — ABNORMAL HIGH (ref 1.4–6.5)
Neutrophils Relative %: 92 %
Platelets: 169 10*3/uL (ref 150–440)
RBC: 5.04 MIL/uL (ref 3.80–5.20)
RDW: 15.4 % — ABNORMAL HIGH (ref 11.5–14.5)
WBC: 17.9 10*3/uL — ABNORMAL HIGH (ref 3.6–11.0)

## 2016-09-22 LAB — COMPREHENSIVE METABOLIC PANEL
ALT: 16 U/L (ref 14–54)
AST: 39 U/L (ref 15–41)
Albumin: 3.3 g/dL — ABNORMAL LOW (ref 3.5–5.0)
Alkaline Phosphatase: 55 U/L (ref 38–126)
Anion gap: 11 (ref 5–15)
BUN: 23 mg/dL — ABNORMAL HIGH (ref 6–20)
CO2: 23 mmol/L (ref 22–32)
Calcium: 8.4 mg/dL — ABNORMAL LOW (ref 8.9–10.3)
Chloride: 99 mmol/L — ABNORMAL LOW (ref 101–111)
Creatinine, Ser: 0.97 mg/dL (ref 0.44–1.00)
GFR calc Af Amer: >60 mL/min (ref >60)
GFR calc non Af Amer: 52 mL/min — ABNORMAL LOW (ref >60)
Glucose, Bld: 163 mg/dL — ABNORMAL HIGH (ref 65–99)
Potassium: 4.9 mmol/L (ref 3.5–5.1)
Sodium: 133 mmol/L — ABNORMAL LOW (ref 135–145)
Total Bilirubin: 1.3 mg/dL — ABNORMAL HIGH (ref 0.3–1.2)
Total Protein: 6.4 g/dL — ABNORMAL LOW (ref 6.5–8.1)

## 2016-09-22 LAB — BRAIN NATRIURETIC PEPTIDE: B Natriuretic Peptide: 17 pg/mL (ref 0.0–100.0)

## 2016-09-22 LAB — TROPONIN I: Troponin I: <0.03 ng/mL (ref <0.03)

## 2016-09-22 MED ORDER — PANTOPRAZOLE SODIUM 40 MG PO TBEC
40.0000 mg | DELAYED_RELEASE_TABLET | Freq: Every day | ORAL | Status: DC
Start: 2016-09-23 — End: 2016-09-27
  Administered 2016-09-23 – 2016-09-27 (×5): 40 mg via ORAL
  Filled 2016-09-22 (×6): qty 1

## 2016-09-22 MED ORDER — CITALOPRAM HYDROBROMIDE 20 MG PO TABS
20.0000 mg | ORAL_TABLET | Freq: Every day | ORAL | Status: DC
  Administered 2016-09-23 – 2016-09-27 (×5): 20 mg via ORAL
  Filled 2016-09-22 (×5): qty 1

## 2016-09-22 MED ORDER — ADULT MULTIVITAMIN W/MINERALS CH
1.0000 | ORAL_TABLET | Freq: Every day | ORAL | Status: DC
  Administered 2016-09-23 – 2016-09-27 (×5): 1 via ORAL
  Filled 2016-09-22 (×5): qty 1

## 2016-09-22 MED ORDER — OSELTAMIVIR PHOSPHATE 75 MG PO CAPS: 75.0000 mg | ORAL_CAPSULE | Freq: Two times a day (BID) | ORAL | Status: DC

## 2016-09-22 MED ORDER — VITAMIN C 500 MG PO TABS
1000.0000 mg | ORAL_TABLET | Freq: Every day | ORAL | Status: DC
Start: 2016-09-23 — End: 2016-09-27
  Administered 2016-09-23 – 2016-09-27 (×5): 1000 mg via ORAL
  Filled 2016-09-22 (×5): qty 2

## 2016-09-22 MED ORDER — PRAVASTATIN SODIUM 40 MG PO TABS
40.0000 mg | ORAL_TABLET | Freq: Every day | ORAL | Status: DC
  Administered 2016-09-22 – 2016-09-26 (×5): 40 mg via ORAL
  Filled 2016-09-22 (×5): qty 1

## 2016-09-22 MED ORDER — AMOXICILLIN-POT CLAVULANATE 500-125 MG PO TABS: 1.0000 | ORAL_TABLET | Freq: Two times a day (BID) | ORAL | Status: DC

## 2016-09-22 MED ORDER — HYDROCOD POLST-CPM POLST ER 10-8 MG/5ML PO SUER
5.0000 mL | Freq: Two times a day (BID) | ORAL | Status: DC
  Administered 2016-09-22 – 2016-09-27 (×10): 5 mL via ORAL
  Filled 2016-09-22 (×10): qty 5

## 2016-09-22 MED ORDER — OSELTAMIVIR PHOSPHATE 30 MG PO CAPS
30.0000 mg | ORAL_CAPSULE | Freq: Two times a day (BID) | ORAL | Status: AC
  Administered 2016-09-22 – 2016-09-27 (×10): 30 mg via ORAL
  Filled 2016-09-22 (×10): qty 1

## 2016-09-22 MED ORDER — VITAMIN D 1000 UNITS PO TABS
5000.0000 [IU] | ORAL_TABLET | Freq: Every day | ORAL | Status: DC
  Administered 2016-09-23 – 2016-09-27 (×5): 5000 [IU] via ORAL
  Filled 2016-09-22 (×5): qty 5

## 2016-09-22 MED ORDER — ENOXAPARIN SODIUM 40 MG/0.4ML ~~LOC~~ SOLN
40.0000 mg | SUBCUTANEOUS | Status: DC
  Administered 2016-09-22 – 2016-09-25 (×4): 40 mg via SUBCUTANEOUS
  Filled 2016-09-22 (×5): qty 0.4

## 2016-09-22 MED ORDER — BUDESONIDE 0.25 MG/2ML IN SUSP
0.2500 mg | Freq: Two times a day (BID) | RESPIRATORY_TRACT | Status: DC
  Administered 2016-09-23 – 2016-09-27 (×9): 0.25 mg via RESPIRATORY_TRACT
  Filled 2016-09-22 (×8): qty 2

## 2016-09-22 MED ORDER — ZOLPIDEM TARTRATE 5 MG PO TABS
5.0000 mg | ORAL_TABLET | Freq: Every evening | ORAL | Status: DC | PRN
  Administered 2016-09-24 – 2016-09-26 (×3): 5 mg via ORAL
  Filled 2016-09-22 (×3): qty 1

## 2016-09-22 MED ORDER — ACETAMINOPHEN 325 MG PO TABS
650.0000 mg | ORAL_TABLET | Freq: Four times a day (QID) | ORAL | Status: DC | PRN
  Administered 2016-09-22: 650 mg via ORAL
  Filled 2016-09-22: qty 2

## 2016-09-22 MED ORDER — DEXTROSE 5 % IV SOLN
500.0000 mg | Freq: Once | INTRAVENOUS | Status: DC
  Filled 2016-09-22: qty 500

## 2016-09-22 MED ORDER — CEFTRIAXONE SODIUM-DEXTROSE 1-3.74 GM-% IV SOLR: 1.0000 g | Freq: Once | INTRAVENOUS | Status: DC

## 2016-09-22 MED ORDER — IPRATROPIUM-ALBUTEROL 0.5-2.5 (3) MG/3ML IN SOLN
3.0000 mL | Freq: Four times a day (QID) | RESPIRATORY_TRACT | Status: DC
  Administered 2016-09-23 – 2016-09-25 (×12): 3 mL via RESPIRATORY_TRACT
  Filled 2016-09-22 (×12): qty 3

## 2016-09-22 MED ORDER — LEVOTHYROXINE SODIUM 125 MCG PO TABS
125.0000 ug | ORAL_TABLET | Freq: Every day | ORAL | Status: DC
  Administered 2016-09-23 – 2016-09-27 (×4): 125 ug via ORAL
  Filled 2016-09-22: qty 2
  Filled 2016-09-22 (×4): qty 3

## 2016-09-22 MED ORDER — ASPIRIN EC 81 MG PO TBEC
81.0000 mg | DELAYED_RELEASE_TABLET | Freq: Every day | ORAL | Status: DC
  Administered 2016-09-23 – 2016-09-27 (×5): 81 mg via ORAL
  Filled 2016-09-22 (×5): qty 1

## 2016-09-22 MED ORDER — METHYLPREDNISOLONE SODIUM SUCC 40 MG IJ SOLR
40.0000 mg | Freq: Every day | INTRAMUSCULAR | Status: DC
  Administered 2016-09-22 – 2016-09-27 (×6): 40 mg via INTRAVENOUS
  Filled 2016-09-22 (×6): qty 1

## 2016-09-22 MED ORDER — TRAZODONE HCL 50 MG PO TABS
50.0000 mg | ORAL_TABLET | Freq: Every day | ORAL | Status: DC
Start: 2016-09-22 — End: 2016-09-27
  Administered 2016-09-22 – 2016-09-26 (×5): 50 mg via ORAL
  Filled 2016-09-22 (×5): qty 1

## 2016-09-22 MED ORDER — CEFTRIAXONE SODIUM 1 G IJ SOLR
1.0000 g | Freq: Once | INTRAMUSCULAR | Status: DC
Start: 2016-09-22 — End: 2016-09-22

## 2016-09-22 MED ORDER — AMOXICILLIN-POT CLAVULANATE 875-125 MG PO TABS
1.0000 | ORAL_TABLET | Freq: Two times a day (BID) | ORAL | Status: DC
  Administered 2016-09-22 – 2016-09-25 (×6): 1 via ORAL
  Filled 2016-09-22 (×7): qty 1

## 2016-09-22 MED ORDER — ACETAMINOPHEN 650 MG RE SUPP: 650.0000 mg | Freq: Four times a day (QID) | RECTAL | Status: DC | PRN

## 2016-09-22 NOTE — ED Notes (Signed)
Lab called for add on 

## 2016-09-22 NOTE — ED Provider Notes (Addendum)
[facility] Emergency Department Provider Note   ____________________________________________   First MD Initiated Contact with Patient 09/22/16 1908     (approximate)  I have reviewed the triage vital signs and the nursing notes.   HISTORY  Chief Complaint Weakness    HPI Abigail Strong is a 83 y.o. female who reports she was in the hospital 2 weeks ago with pneumonia. She went home his been feeling okay but got weak today and so try to stay in bed she got up to go to the bathroom and her legs gave way and she fell down she hit the nightstand with her back. She is not having any pain or tenderness at present she feels weak she is coughing and had a fever 101 per EMS in the emergency room it was difficult to get her to sit up and when she did so her O2 sats went down to 90% on 2 L of oxygen in spite of taking deep breaths. I should note the patient is not usually on oxygen so that would mean that her oxygen saturation without oxygen would probably be about 86% or so. I need to add the patient did not hit her head or her neck and did not pass out when she fell.   Past Medical History:  Diagnosis Date  . Depression   . GERD (gastroesophageal reflux disease)   . Hyperlipidemia   . Hypothyroidism   . Insomnia     Patient Active Problem List   Diagnosis Date Noted  . Bronchitis 09/09/2016  . Diverticulitis 03/23/2015  . HYPERTENSION 12/08/2009  . COUGH 12/08/2009    Past Surgical History:  Procedure Laterality Date  . ABDOMINAL HYSTERECTOMY    . COLON SURGERY     partial colectomy  . NISSEN FUNDOPLICATION      Prior to Admission medications   Medication Sig Start Date End Date Taking? Authorizing Provider  acetaminophen (TYLENOL) 500 MG tablet Take 1,000 mg by mouth every 6 (six) hours as needed.    Historical Provider, MD  albuterol (PROVENTIL) (2.5 MG/3ML) 0.083% nebulizer solution Take 3 mLs (2.5 mg total) by nebulization every 6 (six)  hours as needed for wheezing or shortness of breath. 09/14/16   Hillary Bow, MD  Ascorbic Acid (VITAMIN C) 1000 MG tablet Take 1,000 mg by mouth daily.    Historical Provider, MD  aspirin EC 81 MG tablet Take 81 mg by mouth daily.    Historical Provider, MD  chlorpheniramine-HYDROcodone (TUSSIONEX) 10-8 MG/5ML SUER Take 5 mLs by mouth 2 (two) times daily. 09/14/16   Hillary Bow, MD  Cholecalciferol (VITAMIN D3) 5000 UNITS CAPS Take 5,000 Units by mouth daily.    Historical Provider, MD  citalopram (CELEXA) 20 MG tablet Take 20 mg by mouth daily.    Historical Provider, MD  Ginkgo Biloba 120 MG CAPS Take 120 mg by mouth daily.    Historical Provider, MD  guaiFENesin (MUCINEX) 600 MG 12 hr tablet Take 1 tablet (600 mg total) by mouth 2 (two) times daily. 09/14/16   Hillary Bow, MD  levothyroxine (SYNTHROID, LEVOTHROID) 125 MCG tablet Take 125 mcg by mouth daily before breakfast.    Historical Provider, MD  meclizine (ANTIVERT) 12.5 MG tablet Take 12.5 mg by mouth 2 (two) times daily.    Historical Provider, MD  Multiple Vitamins-Minerals (CENTRUM SILVER ADULT 50+) TABS Take 1 tablet by mouth daily.    Historical Provider, MD  omeprazole (PRILOSEC) 40 MG capsule Take 40 mg by mouth  2 (two) times daily.    Historical Provider, MD  pravastatin (PRAVACHOL) 40 MG tablet Take 40 mg by mouth at bedtime.    Historical Provider, MD  predniSONE (STERAPRED UNI-PAK 21 TAB) 10 MG (21) TBPK tablet Take 1 tablet (10 mg total) by mouth daily. 60 mg day 1. Taper by 10 mg daily for total 6 days. 09/14/16   Hillary Bow, MD  traZODone (DESYREL) 50 MG tablet Take 50 mg by mouth at bedtime.    Historical Provider, MD  zolpidem (AMBIEN) 10 MG tablet Take 5 mg by mouth at bedtime as needed for sleep.    Historical Provider, MD    Allergies Penicillins and Sulfonamide derivatives  No family history on file.  Social History Social History  Substance Use Topics  . Smoking status: Never Smoker  . Smokeless tobacco:  Never Used  . Alcohol use No    Review of Systems Constitutional:  fever/chills Eyes: No visual changes. ENT: No sore throat. Cardiovascular: Denies chest pain. Respiratory:shortness of breath. Gastrointestinal: No abdominal pain.  No nausea, no vomiting.  No diarrhea.  No constipation. Genitourinary: Negative for dysuria. Musculoskeletal: Negative for back pain. Skin: Negative for rash. Neurological: Negative for headaches, focal weakness or numbness.  10-point ROS otherwise negative.  ____________________________________________   PHYSICAL EXAM:  VITAL SIGNS: ED Triage Vitals  Enc Vitals Group     BP 09/22/16 1834 129/69     Pulse Rate 09/22/16 1834 (!) 55     Resp 09/22/16 1834 18     Temp 09/22/16 1834 99.1 F (37.3 C)     Temp Source 09/22/16 1834 Oral     SpO2 09/22/16 1834 92 %     Weight 09/22/16 1835 180 lb (81.6 kg)     Height 09/22/16 1835 5\' 5"  (1.651 m)     Head Circumference --      Peak Flow --      Pain Score --      Pain Loc --      Pain Edu? --      Excl. in [place]? --     Constitutional: Alert and oriented. Well appearing and in no acute distress. Eyes: Conjunctivae are normal. PERRL. EOMI. Head: Atraumatic. Nose: No congestion/rhinnorhea. Mouth/Throat: Mucous membranes are moist.  Oropharynx non-erythematous. Neck: No stridor.  Cardiovascular: Normal rate, regular rhythm. Grossly normal heart sounds.  Good peripheral circulation. Respiratory: Normal respiratory effort.  No retractions. Lungs CTAB. Gastrointestinal: Soft and nontender. No distention. No abdominal bruits. No CVA tenderness. Musculoskeletal: No lower extremity tenderness nor edema.  No joint effusions. Neurologic:  Normal speech and language. No gross focal neurologic deficits are appreciated. No gait instability.   ____________________________________________   LABS (all labs ordered are listed, but only abnormal results are displayed)  Labs Reviewed  CBC WITH  DIFFERENTIAL/PLATELET - Abnormal; Notable for the following:       Result Value   WBC 17.9 (*)    RDW 15.4 (*)    Neutro Abs 16.6 (*)    Lymphs Abs 0.5 (*)    All other components within normal limits  COMPREHENSIVE METABOLIC PANEL - Abnormal; Notable for the following:    Sodium 133 (*)    Chloride 99 (*)    Glucose, Bld 163 (*)    BUN 23 (*)    Calcium 8.4 (*)    Total Protein 6.4 (*)    Albumin 3.3 (*)    Total Bilirubin 1.3 (*)    GFR calc non Af Amer 52 (*)  All other components within normal limits  INFLUENZA PANEL BY PCR (TYPE A & B) - Abnormal; Notable for the following:    Influenza A By PCR POSITIVE (*)    All other components within normal limits  CULTURE, BLOOD (ROUTINE X 2)  CULTURE, BLOOD (ROUTINE X 2)  TROPONIN I  BRAIN NATRIURETIC PEPTIDE  URINALYSIS, COMPLETE (UACMP) WITH MICROSCOPIC   ____________________________________________  EKG  EKG read and interpreted by me shows sinus tachycardia rate of 120 patient has PACs appears actually rating supraventricular bigeminy and some of the premature beats appear to have P waves in front of them. Patient has left axis no acute ST-T wave changes ____________________________________________  RADIOLOGY  Study Result   CLINICAL DATA:  Cough for 1 month.  EXAM: PORTABLE CHEST 1 VIEW  COMPARISON:  09/08/2016  FINDINGS: Stable elevation of the right hemidiaphragm. The lungs are clear. Pulmonary vasculature is normal. No pleural effusions. Hilar and mediastinal contours are unremarkable and unchanged  IMPRESSION: No acute cardiopulmonary findings.   Electronically Signed   By: Andreas Newport M.D.   On: 09/22/2016 19:27     ____________________________________________   PROCEDURES  Procedure(s) performed:   Procedures  Critical Care performed:   ____________________________________________   INITIAL IMPRESSION / ASSESSMENT AND PLAN / ED COURSE  Pertinent labs & imaging results  that were available during my care of the patient were reviewed by me and considered in my medical decision making (see chart for details).   Patient becomes increasingly weaker and more hypoxic the alarm sounding before patient was turned up to 4 L she was sat up again and her O2 sat went down to 88.     ____________________________________________   FINAL CLINICAL IMPRESSION(S) / ED DIAGNOSES  Final diagnoses:  Weakness  Hypoxia  Tachycardia  Fever, unspecified fever cause  Influenza      NEW MEDICATIONS STARTED DURING THIS VISIT:  New Prescriptions   No medications on file     Note:  This document was prepared using Dragon voice recognition software and may include unintentional dictation errors.    Nena Polio, MD 09/22/16 [id number]    Nena Polio, MD 09/22/16 [phone]

## 2016-09-22 NOTE — ED Triage Notes (Signed)
Reports she got a little off balance and fell. Denies pain.

## 2016-09-22 NOTE — H&P (Signed)
[facility] [place] [facility]   PATIENT NAME: Abigail Strong    MR#:  [id number]  DATE OF BIRTH:  [birth year]  DATE OF ADMISSION:  09/22/2016  PRIMARY CARE PHYSICIAN: Corky Downs, MD   REQUESTING/REFERRING PHYSICIAN: Dr Dorothea Glassman  CHIEF COMPLAINT:   Chief Complaint  Patient presents with  . Weakness    HISTORY OF PRESENT ILLNESS:  Abigail Strong  is a 83 y.o. female presents to the hospital with weakness. Her great-grandson is getting over the flu when her granddaughter is getting over the flu also. The patient was recently in the hospital for respiratory issues but her flu was negative at that time. She saw Dr. Juel Burrow yesterday as a hospital follow-up and also she wasn't feeling well. Today she was lying in bed all day and can hardly get out of bed poor appetite and not eating very well. When she tried to get out of bed she couldn't even walk. She had a fall. Also had some slurred speech which did not last very long. Today her legs were shaking so bad that she couldn't control them. In the ER she was influenza A positive PCR. Her pulse ox was 88% on room air and hospitalist services were contacted for further evaluation.  PAST MEDICAL HISTORY:   Past Medical History:  Diagnosis Date  . Depression   . GERD (gastroesophageal reflux disease)   . Hyperlipidemia   . Hypothyroidism   . Insomnia     PAST SURGICAL HISTORY:   Past Surgical History:  Procedure Laterality Date  . ABDOMINAL HYSTERECTOMY    . cataracts    . COLON SURGERY     partial colectomy  . intestinal blockage    . NISSEN FUNDOPLICATION      SOCIAL HISTORY:   Social History  Substance Use Topics  . Smoking status: Never Smoker  . Smokeless tobacco: Never Used  . Alcohol use No    FAMILY HISTORY:   Family History  Problem Relation Age of Onset  . Cervical cancer Mother     DRUG ALLERGIES:   Allergies  Allergen Reactions  . Penicillins Other (See Comments)     Dry throat Pt doesn't recall having any issues with penicillins  . Sulfonamide Derivatives     REVIEW OF SYSTEMS:  CONSTITUTIONAL: Positive for fever, chills, sweats, fatigue and weakness.  EYES: No blurred or double vision.  EARS, NOSE, AND THROAT: No tinnitus or ear pain. No sore throat. Positive for runny nose RESPIRATORY: Positive for cough, no shortness of breath, wheezing or hemoptysis.  CARDIOVASCULAR: No chest pain, orthopnea, edema.  GASTROINTESTINAL: No nausea, vomiting, diarrhea or abdominal pain. No blood in bowel movements GENITOURINARY: No dysuria, hematuria.  ENDOCRINE: No polyuria, nocturia,  HEMATOLOGY: No anemia, easy bruising or bleeding SKIN: No rash or lesion. MUSCULOSKELETAL: No joint pain or arthritis.   NEUROLOGIC: No tingling, numbness, weakness.  PSYCHIATRY: Positive for depression.   MEDICATIONS AT HOME:   Prior to Admission medications   Medication Sig Start Date End Date Taking? Authorizing Provider  acetaminophen (TYLENOL) 500 MG tablet Take 1,000 mg by mouth every 6 (six) hours as needed.    Historical Provider, MD  albuterol (PROVENTIL) (2.5 MG/3ML) 0.083% nebulizer solution Take 3 mLs (2.5 mg total) by nebulization every 6 (six) hours as needed for wheezing or shortness of breath. 09/14/16   Milagros Loll, MD  Ascorbic Acid (VITAMIN C) 1000 MG tablet Take 1,000 mg by mouth daily.    Historical Provider, MD  aspirin EC  81 MG tablet Take 81 mg by mouth daily.    Historical Provider, MD  chlorpheniramine-HYDROcodone (TUSSIONEX) 10-8 MG/5ML SUER Take 5 mLs by mouth 2 (two) times daily. 09/14/16   Milagros Loll, MD  Cholecalciferol (VITAMIN D3) 5000 UNITS CAPS Take 5,000 Units by mouth daily.    Historical Provider, MD  citalopram (CELEXA) 20 MG tablet Take 20 mg by mouth daily.    Historical Provider, MD  Ginkgo Biloba 120 MG CAPS Take 120 mg by mouth daily.    Historical Provider, MD  guaiFENesin (MUCINEX) 600 MG 12 hr tablet Take 1 tablet (600 mg  total) by mouth 2 (two) times daily. 09/14/16   Milagros Loll, MD  levothyroxine (SYNTHROID, LEVOTHROID) 125 MCG tablet Take 125 mcg by mouth daily before breakfast.    Historical Provider, MD  meclizine (ANTIVERT) 12.5 MG tablet Take 12.5 mg by mouth 2 (two) times daily.    Historical Provider, MD  Multiple Vitamins-Minerals (CENTRUM SILVER ADULT 50+) TABS Take 1 tablet by mouth daily.    Historical Provider, MD  omeprazole (PRILOSEC) 40 MG capsule Take 40 mg by mouth 2 (two) times daily.    Historical Provider, MD  pravastatin (PRAVACHOL) 40 MG tablet Take 40 mg by mouth at bedtime.    Historical Provider, MD  predniSONE (STERAPRED UNI-PAK 21 TAB) 10 MG (21) TBPK tablet Take 1 tablet (10 mg total) by mouth daily. 60 mg day 1. Taper by 10 mg daily for total 6 days. 09/14/16   Milagros Loll, MD  traZODone (DESYREL) 50 MG tablet Take 50 mg by mouth at bedtime.    Historical Provider, MD  zolpidem (AMBIEN) 10 MG tablet Take 5 mg by mouth at bedtime as needed for sleep.    Historical Provider, MD      VITAL SIGNS:  Blood pressure 134/71, pulse (!) 108, temperature 99.1 F (37.3 C), temperature source Oral, resp. rate (!) 27, height 5\' 5"  (1.651 m), weight 81.6 kg (180 lb), SpO2 92 %.  PHYSICAL EXAMINATION:  GENERAL:  83 y.o.-year-old patient lying in the bed with no acute distress.  EYES: Pupils equal, round, reactive to light and accommodation. No scleral icterus. Extraocular muscles intact.  HEENT: Head atraumatic, normocephalic. Oropharynx and nasopharynx clear. Bilateral tympanic membrane bulging and erythematous NECK:  Supple, no jugular venous distention. No thyroid enlargement, no tenderness.  LUNGS: Decreased breath sounds bilaterally, positive expiratory wheezing. No rales,rhonchi or crepitation. No use of accessory muscles of respiration.  CARDIOVASCULAR: S1, S2 tachycardic. No murmurs, rubs, or gallops.  ABDOMEN: Soft, nontender, nondistended. Bowel sounds present. No organomegaly or  mass.  EXTREMITIES: 2+ edema, no cyanosis, or clubbing.  NEUROLOGIC: Cranial nerves II through XII are intact. Muscle strength 5/5 in all extremities. Sensation intact. Gait not checked.  PSYCHIATRIC: The patient is alert and oriented x 3.  SKIN: No rash, lesion, or ulcer.   LABORATORY PANEL:   CBC  Recent Labs Lab 09/22/16 1837  WBC 17.9*  HGB 14.8  HCT 44.0  PLT 169   ------------------------------------------------------------------------------------------------------------------  Chemistries   Recent Labs Lab 09/22/16 1837  NA 133*  K 4.9  CL 99*  CO2 23  GLUCOSE 163*  BUN 23*  CREATININE 0.97  CALCIUM 8.4*  AST 39  ALT 16  ALKPHOS 55  BILITOT 1.3*   ------------------------------------------------------------------------------------------------------------------  Cardiac Enzymes  Recent Labs Lab 09/22/16 1837  TROPONINI <0.03   ------------------------------------------------------------------------------------------------------------------  RADIOLOGY:  Dg Chest Port 1 View  Result Date: 09/22/2016 CLINICAL DATA:  Cough for 1 month. EXAM: PORTABLE  CHEST 1 VIEW COMPARISON:  09/08/2016 FINDINGS: Stable elevation of the right hemidiaphragm. The lungs are clear. Pulmonary vasculature is normal. No pleural effusions. Hilar and mediastinal contours are unremarkable and unchanged IMPRESSION: No acute cardiopulmonary findings. Electronically Signed   By: Ellery Plunk M.D.   On: 09/22/2016 19:27    EKG:   Sinus tachycardia 120 bpm, bigeminy, Q waves inferiorly  IMPRESSION AND PLAN:   1. Acute respiratory failure with hypoxia. Pulse ox 88% on room air. Oxygen supplementation ordered. 2. Acute bronchitis with influenza A. start low dose Tamiflu. Start Solu-Medrol once a day. Budesonide and DuoNeb nebulizers. 3. Otitis media. Augmentin twice a day 4. Leukocytosis likely secondary to steroids given on last admission. 5. Hypothyroidism unspecified continue  Synthroid 6. GERD on omeprazole 7. Hyperlipidemia unspecified on pravastatin 8. Sleep apnea with CPAP at night 9. Slurred speech. Patient did not want to do a CT scan tonight we'll order for tomorrow morning. Patient already takes aspirin at home.  All the records are reviewed and case discussed with ED provider. Management plans discussed with the patient, family and they are in agreement.  CODE STATUS: Full code  TOTAL TIME TAKING CARE OF THIS PATIENT: 50 minutes.    Alford Highland M.D on 09/22/2016 at 9:29 PM  Between 7am to 6pm - Pager - [phone]  After 6pm call admission pager [phone]  [facility]  [phone]  CC: Primary care physician; Corky Downs, MD

## 2016-09-22 NOTE — ED Notes (Signed)
Dx with re

## 2016-09-22 NOTE — ED Notes (Signed)
Report given to Noel, RN 

## 2016-09-22 NOTE — Progress Notes (Signed)
Patient ID: Abigail Strong, female   DOB: 07-24-1930, 83 y.o.   MRN: [id number]  ACP discussion Patient and daughter at the bedside. This is a patient's second hospitalization short period of time with respiratory issues. Now has influenza A positive. Patient has acute respiratory failure with hypoxia with tachycardia. I was discussing CODE STATUS with the patient and daughter. At this point they would like to remain a full code.  Time spent on ACP discussion 17 minutes Dr. Loletha Grayer

## 2016-09-23 LAB — CBC
HCT: 41.1 % (ref 35.0–47.0)
Hemoglobin: 13.5 g/dL (ref 12.0–16.0)
MCH: 29 pg (ref 26.0–34.0)
MCHC: 32.9 g/dL (ref 32.0–36.0)
MCV: 88.1 fL (ref 80.0–100.0)
Platelets: 152 10*3/uL (ref 150–440)
RBC: 4.67 MIL/uL (ref 3.80–5.20)
RDW: 15.2 % — ABNORMAL HIGH (ref 11.5–14.5)
WBC: 25.6 10*3/uL — ABNORMAL HIGH (ref 3.6–11.0)

## 2016-09-23 LAB — BASIC METABOLIC PANEL
Anion gap: 7 (ref 5–15)
BUN: 24 mg/dL — ABNORMAL HIGH (ref 6–20)
CO2: 28 mmol/L (ref 22–32)
Calcium: 8.5 mg/dL — ABNORMAL LOW (ref 8.9–10.3)
Chloride: 99 mmol/L — ABNORMAL LOW (ref 101–111)
Creatinine, Ser: 0.87 mg/dL (ref 0.44–1.00)
GFR calc Af Amer: >60 mL/min (ref >60)
GFR calc non Af Amer: 59 mL/min — ABNORMAL LOW (ref >60)
Glucose, Bld: 181 mg/dL — ABNORMAL HIGH (ref 65–99)
Potassium: 4.9 mmol/L (ref 3.5–5.1)
Sodium: 134 mmol/L — ABNORMAL LOW (ref 135–145)

## 2016-09-23 MED ORDER — SODIUM CHLORIDE 0.9 % IV SOLN
INTRAVENOUS | Status: DC
  Administered 2016-09-23: 14:00:00 via INTRAVENOUS

## 2016-09-23 NOTE — Clinical Social Work Note (Signed)
CSW received consult for home health assistance. Please consult case management as that is a service they provide. CSW signing off.   Santiago Bumpers, MSW, Latanya Presser [phone]

## 2016-09-23 NOTE — Progress Notes (Signed)
Physical Therapy Evaluation Patient Details Name: Abigail Strong MRN: [id number] DOB: 10/24/1929 Today's Date: 09/23/2016   History of Present Illness  Patient is an 83 y.o. female admitted on 03 FEB for progressive weakness. Found to have Influenza A. PMH includes depression, GERD, HLD, hypothyroidism, and insomnia.  Clinical Impression  Patient is a pleasant female admitted with ARF with hypoxia. Patient lives with daughter and admits to history of falls with acquired bruises in past year. On evaluation, patient demonstrates independence with bed mobility and modified independence with transfers. CGA used during gait due to mild unsteadiness with verbal cues to utilize RW properly. Patient admits to using rollator at home but reports that it is too heavy to get into car. It is recommended that patient will benefit from continued skilled and progressive PT to improve dynamic balance and prevent falls in the future. She may benefit from a RW at home for improved stability during gait as well as f/u HHPT to decrease fall risk in future.    Follow Up Recommendations Home health PT    Equipment Recommendations  Rolling walker with 5" wheels    Recommendations for Other Services       Precautions / Restrictions Precautions Precautions: Fall Restrictions Weight Bearing Restrictions: No      Mobility  Bed Mobility Overal bed mobility: Independent             General bed mobility comments: Patient performs bed mobility independently.  Transfers Overall transfer level: Modified independent Equipment used: Rolling walker (2 wheeled)             General transfer comment: Patient moves from sit to stand and stand to sit with good safety awareness.  Ambulation/Gait Ambulation/Gait assistance: Min guard Ambulation Distance (Feet): 25 Feet Assistive device: Rolling walker (2 wheeled)       General Gait Details: Patient ambulates with CGA and RW. Shows good awareness of  surroundings but is slightly unsteady.  Stairs            Wheelchair Mobility    Modified Rankin (Stroke Patients Only)       Balance Overall balance assessment: Modified Independent;History of Falls                                           Pertinent Vitals/Pain Pain Assessment: No/denies pain    Home Living Family/patient expects to be discharged to:: Private residence Living Arrangements: Children Available Help at Discharge: Family;Available PRN/intermittently Type of Home: House Home Access: Stairs to enter Entrance Stairs-Rails: None Entrance Stairs-Number of Steps: 2 Home Layout: One level Home Equipment: Environmental consultant - 4 wheels      Prior Function Level of Independence: Independent with assistive device(s)               Hand Dominance        Extremity/Trunk Assessment   Upper Extremity Assessment Upper Extremity Assessment: Generalized weakness    Lower Extremity Assessment Lower Extremity Assessment: Generalized weakness       Communication   Communication: No difficulties  Cognition Arousal/Alertness: Awake/alert Behavior During Therapy: WFL for tasks assessed/performed Overall Cognitive Status: Within Functional Limits for tasks assessed                      General Comments      Exercises     Assessment/Plan    PT Assessment Patient  needs continued PT services  PT Problem List Decreased activity tolerance;Decreased strength;Decreased mobility;Decreased balance          PT Treatment Interventions Gait training;Stair training;Functional mobility training;Therapeutic activities;Therapeutic exercise;Balance training;Patient/family education    PT Goals (Current goals can be found in the Care Plan section)  Acute Rehab PT Goals Patient Stated Goal: "To go home" PT Goal Formulation: With patient Time For Goal Achievement: 10/07/16 Potential to Achieve Goals: Good    Frequency Min 2X/week   Barriers  to discharge        Co-evaluation               End of Session Equipment Utilized During Treatment: Gait belt Activity Tolerance: Patient tolerated treatment well Patient left: in chair;with call bell/phone within reach;with chair alarm set           Time: 1610-9604 PT Time Calculation (min) (ACUTE ONLY): 17 min   Charges:   PT Evaluation $PT Eval Low Complexity: 1 Procedure     PT G Codes:        Neita Carp, PT, DPT 09/23/2016, 2:39 PM

## 2016-09-23 NOTE — Progress Notes (Signed)
[facility] - [place] at [facility]   PATIENT NAME: Abigail Strong    MR#:  [id number]  DATE OF BIRTH:  March 04, 1930  SUBJECTIVE:  CHIEF COMPLAINT:   Chief Complaint  Patient presents with  . Weakness   The patient with GERD, hyperlipidemia, hypothyroidism, who , poor appetite, tremors. On arrival to the hospital, she was noted to have influenza A+. Her pulse oximetry revealed O2 sats of 88% on room air and she was admitted to the hospital. Her chest x-ray showed no pneumonia, however, CT of head revealed paranasal sinus disease, acute sinusitis, patient was initiated on antibiotic Augmentin. Influenza a test was positive. Patient was initiated on Tamiflu She feels somewhat better today, complains of some cough, wheezing in the lungs.  Review of Systems  Constitutional: Positive for chills. Negative for fever and weight loss.  HENT: Negative for congestion.   Eyes: Negative for blurred vision and double vision.  Respiratory: Positive for cough, sputum production, shortness of breath and wheezing.   Cardiovascular: Negative for chest pain, palpitations, orthopnea, leg swelling and PND.  Gastrointestinal: Negative for abdominal pain, blood in stool, constipation, diarrhea, nausea and vomiting.  Genitourinary: Negative for dysuria, frequency, hematuria and urgency.  Musculoskeletal: Negative for falls.  Neurological: Negative for dizziness, tremors, focal weakness and headaches.  Endo/Heme/Allergies: Does not bruise/bleed easily.  Psychiatric/Behavioral: Negative for depression. The patient does not have insomnia.     VITAL SIGNS: Blood pressure 102/67, pulse 78, temperature 97.8 F (36.6 C), temperature source Oral, resp. rate 20, height 5\' 5"  (1.651 m), weight 81.6 kg (180 lb), SpO2 94 %.  PHYSICAL EXAMINATION:   GENERAL:  83 y.o.-year-old patient lying in the bed with no acute distress.  EYES: Pupils equal, round, reactive to light and accommodation. No  scleral icterus. Extraocular muscles intact.  HEENT: Head atraumatic, normocephalic. Oropharynx and nasopharynx clear.  NECK:  Supple, no jugular venous distention. No thyroid enlargement, no tenderness.  LUNGS: Diminished breath sounds bilaterally, scattered wheezing, rales,rhonchi , but no crepitations . Intermittent use of accessory muscles of respiration, this cough, and speech, movements.  CARDIOVASCULAR: S1, S2 normal. No murmurs, rubs, or gallops.  ABDOMEN: Soft, nontender, nondistended. Bowel sounds present. No organomegaly or mass.  EXTREMITIES: No pedal edema, cyanosis, or clubbing.  NEUROLOGIC: Cranial nerves II through XII are intact. Muscle strength 5/5 in all extremities. Sensation intact. Gait not checked.  PSYCHIATRIC: The patient is alert and oriented x 3.  SKIN: No obvious rash, lesion, or ulcer.   ORDERS/RESULTS REVIEWED:   CBC  Recent Labs Lab 09/22/16 1837 09/23/16 0637  WBC 17.9* 25.6*  HGB 14.8 13.5  HCT 44.0 41.1  PLT 169 152  MCV 87.4 88.1  MCH 29.4 29.0  MCHC 33.6 32.9  RDW 15.4* 15.2*  LYMPHSABS 0.5*  --   MONOABS 0.7  --   EOSABS 0.0  --   BASOSABS 0.1  --    ------------------------------------------------------------------------------------------------------------------  Chemistries   Recent Labs Lab 09/22/16 1837 09/23/16 0637  NA 133* 134*  K 4.9 4.9  CL 99* 99*  CO2 23 28  GLUCOSE 163* 181*  BUN 23* 24*  CREATININE 0.97 0.87  CALCIUM 8.4* 8.5*  AST 39  --   ALT 16  --   ALKPHOS 55  --   BILITOT 1.3*  --    ------------------------------------------------------------------------------------------------------------------ estimated creatinine clearance is 50.8 mL/min (by C-G formula based on SCr of 0.87 mg/dL). ------------------------------------------------------------------------------------------------------------------ No results for input(s): TSH, T4TOTAL, T3FREE, THYROIDAB in the last 72 hours.  Invalid input(s):  FREET3  Cardiac Enzymes  Recent Labs Lab 09/22/16 1837  TROPONINI <0.03   ------------------------------------------------------------------------------------------------------------------ Invalid input(s): POCBNP ---------------------------------------------------------------------------------------------------------------  RADIOLOGY: Ct Head Wo Contrast  Result Date: 09/22/2016 CLINICAL DATA:  Speech abnormality.  Weakness and lethargy. EXAM: CT HEAD WITHOUT CONTRAST TECHNIQUE: Contiguous axial images were obtained from the base of the skull through the vertex without intravenous contrast. COMPARISON:  Head CT 07/25/2016 FINDINGS: Brain: No evidence of acute infarction, hemorrhage, hydrocephalus, extra-axial collection or mass lesion/mass effect. Stable mild for age atrophy. Remote lacunar infarcts in the basal ganglia again seen. Vascular: Atherosclerosis of skullbase vasculature without hyperdense vessel or abnormal calcification. Skull: Normal. Negative for fracture or focal lesion. Sinuses/Orbits: There fluid levels in the sphenoid and right maxillary sinuses, new from prior exam. Opacification of right ethmoid air cells. Bilateral cataract resection. Other: None. IMPRESSION: 1. No evidence of acute intracranial abnormality. Stable mild atrophy and remote lacunar infarcts. 2. Paranasal sinus disease, suspect this is acute sinusitis. Electronically Signed   By: Rubye Oaks M.D.   On: 09/22/2016 21:49   Dg Chest Port 1 View  Result Date: 09/22/2016 CLINICAL DATA:  Cough for 1 month. EXAM: PORTABLE CHEST 1 VIEW COMPARISON:  09/08/2016 FINDINGS: Stable elevation of the right hemidiaphragm. The lungs are clear. Pulmonary vasculature is normal. No pleural effusions. Hilar and mediastinal contours are unremarkable and unchanged IMPRESSION: No acute cardiopulmonary findings. Electronically Signed   By: Ellery Plunk M.D.   On: 09/22/2016 19:27    EKG:  Orders placed or performed during  the hospital encounter of 09/22/16  . ED EKG  . ED EKG  . EKG 12-Lead  . EKG 12-Lead    ASSESSMENT AND PLAN:  Active Problems:   Acute respiratory failure with hypoxia (HCC) #1. Acute respiratory failure with hypoxia due to COPD exacerbation, acute bronchitis, influenza a, and the patient on oxygen, wean down to room air as tolerated, continue Pulmicort, DuoNeb nebs, Tussionex, Solu-Medrol, follow clinically #2. Acute bronchitis, acute sinusitis, continue Augmentin, get sputum cultures if possible #3 influenza A, continue Tamiflu #4. Hyponatremia, initiate IV fluids, follow sodium level in the morning #5. Hyperglycemia, likely steroid related, get hemoglobin A1c #6. Leukocytosis, likely due to steroids, follow closely #7. Generalized weakness, getting physical therapist involved for recommendations Management plans discussed with the patient, family and they are in agreement.   DRUG ALLERGIES:  Allergies  Allergen Reactions  . Penicillins Other (See Comments)    Dry throat Pt doesn't recall having any issues with penicillins  . Sulfonamide Derivatives     CODE STATUS:     Code Status Orders        Start     Ordered   09/22/16 2117  Full code  Continuous     09/22/16 2117    Code Status History    Date Active Date Inactive Code Status Order ID Comments User Context   09/09/2016  1:30 AM 09/14/2016  5:32 PM Full Code [id number]  Tonye Royalty, DO Inpatient   03/23/2015  2:45 AM 03/25/2015  1:28 PM Full Code [id number]  Arnaldo Natal, MD Inpatient      TOTAL TIME TAKING CARE OF THIS PATIENT: 40 minutes.    Katharina Caper M.D on 09/23/2016 at 1:32 PM  Between 7am to 6pm - Pager - [phone]  After 6pm go to [email or web address] - password [email or web address] [facility]  [facility] [place] [facility]  Office  [phone]  CC: Primary care physician; Corky Downs, MD

## 2016-09-24 ENCOUNTER — Inpatient Hospital Stay: Payer: Medicare Other

## 2016-09-24 LAB — SODIUM: Sodium: 135 mmol/L (ref 135–145)

## 2016-09-24 MED ORDER — AZITHROMYCIN 250 MG PO TABS
250.0000 mg | ORAL_TABLET | Freq: Every day | ORAL | Status: DC
  Administered 2016-09-25: 250 mg via ORAL
  Filled 2016-09-24: qty 1

## 2016-09-24 MED ORDER — HYDROCOD POLST-CPM POLST ER 10-8 MG/5ML PO SUER: 5.0000 mL | Freq: Two times a day (BID) | ORAL | Status: DC

## 2016-09-24 MED ORDER — IOPAMIDOL (ISOVUE-370) INJECTION 76%
75.0000 mL | Freq: Once | INTRAVENOUS | Status: AC | PRN
Start: 2016-09-24 — End: 2016-09-24
  Administered 2016-09-24: 75 mL via INTRAVENOUS

## 2016-09-24 MED ORDER — LIDOCAINE 5 % EX PTCH
1.0000 | MEDICATED_PATCH | CUTANEOUS | Status: DC
  Administered 2016-09-24: 1 via TRANSDERMAL
  Filled 2016-09-24 (×4): qty 1

## 2016-09-24 MED ORDER — AZITHROMYCIN 250 MG PO TABS
500.0000 mg | ORAL_TABLET | Freq: Every day | ORAL | Status: AC
  Administered 2016-09-24: 500 mg via ORAL
  Filled 2016-09-24: qty 2

## 2016-09-24 MED ORDER — ENSURE ENLIVE PO LIQD
237.0000 mL | Freq: Two times a day (BID) | ORAL | Status: DC
  Administered 2016-09-25 – 2016-09-26 (×3): 237 mL via ORAL

## 2016-09-24 NOTE — Progress Notes (Signed)
[facility]icians - [place] at [facility]   PATIENT NAME: Abigail Strong    MR#:  [id number]  DATE OF BIRTH:  [birth year]  SUBJECTIVE:  CHIEF COMPLAINT:   Chief Complaint  Patient presents with  . Weakness   The patient with GERD, hyperlipidemia, hypothyroidism, who , poor appetite, tremors. On arrival to the hospital, she was noted to have influenza A+. Her pulse oximetry revealed O2 sats of 88% on room air and she was admitted to the hospital. Her chest x-ray showed no pneumonia, however, CT of head revealed paranasal sinus disease, acute sinusitis, patient was initiated on antibiotic Augmentin. Influenza a test was positive. Patient was initiated on Tamiflu She feels poorly today, complains of significant dry cough, wheezing in the lungs. She has left-sided chest pain, worsening with cough  Review of Systems  Constitutional: Positive for chills. Negative for fever and weight loss.  HENT: Negative for congestion.   Eyes: Negative for blurred vision and double vision.  Respiratory: Positive for cough, sputum production, shortness of breath and wheezing.   Cardiovascular: Negative for chest pain, palpitations, orthopnea, leg swelling and PND.  Gastrointestinal: Negative for abdominal pain, blood in stool, constipation, diarrhea, nausea and vomiting.  Genitourinary: Negative for dysuria, frequency, hematuria and urgency.  Musculoskeletal: Negative for falls.  Neurological: Negative for dizziness, tremors, focal weakness and headaches.  Endo/Heme/Allergies: Does not bruise/bleed easily.  Psychiatric/Behavioral: Negative for depression. The patient does not have insomnia.     VITAL SIGNS: Blood pressure 115/72, pulse 80, temperature 97.9 F (36.6 C), temperature source Oral, resp. rate 17, height 5\' 5"  (1.651 m), weight 81.6 kg (180 lb), SpO2 91 %.  PHYSICAL EXAMINATION:   GENERAL:  83 y.o.-year-old patient lying in the bed in moderate respiratory distress,  tachypneic, uncomfortable, clenching her left side of the chest due to significant pain with cough, coughs intermittently, dry, rattling in the chest from the distance.  EYES: Pupils equal, round, reactive to light and accommodation. No scleral icterus. Extraocular muscles intact.  HEENT: Head atraumatic, normocephalic. Oropharynx and nasopharynx clear.  NECK:  Supple, no jugular venous distention. No thyroid enlargement, no tenderness.  LUNGS: Better air entrance bilaterally, scattered wheezing, rales,rhonchi , but no crepitations . Intermittent use of accessory muscles of respiration, with the cough, speech, movements. Clenching chest due to significant left-sided chest pain, exacerbating with cough CARDIOVASCULAR: S1, S2 normal. Distant. No murmurs, rubs, or gallops.  ABDOMEN: Soft, nontender, nondistended. Bowel sounds present. No organomegaly or mass.  EXTREMITIES: No pedal edema, cyanosis, or clubbing.  NEUROLOGIC: Cranial nerves II through XII are intact. Muscle strength 5/5 in all extremities. Sensation intact. Gait not checked.  PSYCHIATRIC: The patient is alert and oriented x 3.  SKIN: No obvious rash, lesion, or ulcer.   ORDERS/RESULTS REVIEWED:   CBC  Recent Labs Lab 09/22/16 1837 09/23/16 0637  WBC 17.9* 25.6*  HGB 14.8 13.5  HCT 44.0 41.1  PLT 169 152  MCV 87.4 88.1  MCH 29.4 29.0  MCHC 33.6 32.9  RDW 15.4* 15.2*  LYMPHSABS 0.5*  --   MONOABS 0.7  --   EOSABS 0.0  --   BASOSABS 0.1  --    ------------------------------------------------------------------------------------------------------------------  Chemistries   Recent Labs Lab 09/22/16 1837 09/23/16 0637 09/24/16 0540  NA 133* 134* 135  K 4.9 4.9  --   CL 99* 99*  --   CO2 23 28  --   GLUCOSE 163* 181*  --   BUN 23* 24*  --   CREATININE  0.97 0.87  --   CALCIUM 8.4* 8.5*  --   AST 39  --   --   ALT 16  --   --   ALKPHOS 55  --   --   BILITOT 1.3*  --   --     ------------------------------------------------------------------------------------------------------------------ estimated creatinine clearance is 50.8 mL/min (by C-G formula based on SCr of 0.87 mg/dL). ------------------------------------------------------------------------------------------------------------------ No results for input(s): TSH, T4TOTAL, T3FREE, THYROIDAB in the last 72 hours.  Invalid input(s): FREET3  Cardiac Enzymes  Recent Labs Lab 09/22/16 1837  TROPONINI <0.03   ------------------------------------------------------------------------------------------------------------------ Invalid input(s): POCBNP ---------------------------------------------------------------------------------------------------------------  RADIOLOGY: Ct Head Wo Contrast  Result Date: 09/22/2016 CLINICAL DATA:  Speech abnormality.  Weakness and lethargy. EXAM: CT HEAD WITHOUT CONTRAST TECHNIQUE: Contiguous axial images were obtained from the base of the skull through the vertex without intravenous contrast. COMPARISON:  Head CT 07/25/2016 FINDINGS: Brain: No evidence of acute infarction, hemorrhage, hydrocephalus, extra-axial collection or mass lesion/mass effect. Stable mild for age atrophy. Remote lacunar infarcts in the basal ganglia again seen. Vascular: Atherosclerosis of skullbase vasculature without hyperdense vessel or abnormal calcification. Skull: Normal. Negative for fracture or focal lesion. Sinuses/Orbits: There fluid levels in the sphenoid and right maxillary sinuses, new from prior exam. Opacification of right ethmoid air cells. Bilateral cataract resection. Other: None. IMPRESSION: 1. No evidence of acute intracranial abnormality. Stable mild atrophy and remote lacunar infarcts. 2. Paranasal sinus disease, suspect this is acute sinusitis. Electronically Signed   By: Rubye Oaks M.D.   On: 09/22/2016 21:49   Dg Chest Port 1 View  Result Date: 09/22/2016 CLINICAL DATA:  Cough  for 1 month. EXAM: PORTABLE CHEST 1 VIEW COMPARISON:  09/08/2016 FINDINGS: Stable elevation of the right hemidiaphragm. The lungs are clear. Pulmonary vasculature is normal. No pleural effusions. Hilar and mediastinal contours are unremarkable and unchanged IMPRESSION: No acute cardiopulmonary findings. Electronically Signed   By: Ellery Plunk M.D.   On: 09/22/2016 19:27    EKG:  Orders placed or performed during the hospital encounter of 09/22/16  . ED EKG  . ED EKG  . EKG 12-Lead  . EKG 12-Lead    ASSESSMENT AND PLAN:  Active Problems:   Acute respiratory failure with hypoxia (HCC) #1. Acute respiratory failure with hypoxia due to COPD exacerbation, acute bronchitis, influenza a, and the patient on oxygen, wean down to room air as tolerated, continue Pulmicort, DuoNeb nebs, Tussionex, Solu-Medrol, follow clinically. Patient's family denies any history of COPD, getting CT angiogram of the chest, pulmonology consult. Discussed this patient's daughter #2. Acute bronchitis, acute sinusitis, continue Augmentin, add Zithromax , get sputum cultures if possible, adding codeine to help with dry cough #3 influenza A, continue Tamiflu #4. Hyponatremia,  resolved with IV fluids #5. Hyperglycemia, likely steroid related, hemoglobin A1c is pending #6. Leukocytosis, likely due to steroids, following closely #7. Generalized weakness, physical therapist evaluated patient and recommended home health services #8 left-sided chest pain, worsening with cough, likely musculoskeletal, add Lidoderm patch and painful area, codeine for cough suppression  Management plans discussed with the patient, family and they are in agreement.   DRUG ALLERGIES:  Allergies  Allergen Reactions  . Penicillins Other (See Comments)    Dry throat Pt doesn't recall having any issues with penicillins  . Sulfonamide Derivatives     CODE STATUS:     Code Status Orders        Start     Ordered   09/22/16 2117  Full  code  Continuous  09/22/16 2117    Code Status History    Date Active Date Inactive Code Status Order ID Comments User Context   09/09/2016  1:30 AM 09/14/2016  5:32 PM Full Code [id number]  Tonye Royalty, DO Inpatient   03/23/2015  2:45 AM 03/25/2015  1:28 PM Full Code [id number]  Arnaldo Natal, MD Inpatient      TOTAL TIME TAKING CARE OF THIS PATIENT: 40 minutes.  Discussed with patient's daughter, all questions were answered, she voiced understanding and appreciation  Talyssa Gibas M.D on 09/24/2016 at 2:16 PM  Between 7am to 6pm - Pager - [phone]  After 6pm go to [email or web address] - password [email or web address] [facility]  [place] [facility]  Office  [phone]  CC: Primary care physician; Corky Downs, MD

## 2016-09-24 NOTE — Progress Notes (Signed)
PT Cancellation Note  Patient Details Name: Abigail Strong MRN: [id number] DOB: [birth year]   Cancelled Treatment:    Reason Eval/Treat Not Completed: Medical issues which prohibited therapy (per chart review noted with order for chest CT to rule out PE, will hold until results received and pt cleared for activity )   Ariday Brinker 09/24/2016, 2:04 PM

## 2016-09-24 NOTE — Progress Notes (Signed)
Initial Nutrition Assessment  DOCUMENTATION CODES:   Obesity unspecified  INTERVENTION:  - Ensure Enlive BID, each supplement provides 350 calories and 20 grams protein - Recommend liberalization of diet by removing 2g Na limit, to regular, BP is normal -Recommend sliding scale insulin coverage while on solumedrol  NUTRITION DIAGNOSIS:   Inadequate oral intake related to acute illness, poor appetite as evidenced by estimated needs, per patient/family report.  GOAL:   Patient will meet greater than or equal to 90% of their needs  MONITOR:   PO intake, Weight trends, I & O's  REASON FOR ASSESSMENT:   Malnutrition Screening Tool    ASSESSMENT:   83 y.o. female with PMH of GERD, hyperlipidemia, hypothyroidism, who , poor appetite, tremors. On arrival to the hospital, she was noted to have influenza A+. Her pulse oximetry revealed O2 sats of 88% on room air and she was admitted to the hospital. Her chest x-ray showed no pneumonia, however, CT of head revealed paranasal sinus disease, acute sinusitis, patient was initiated on antibiotic Augmentin. Influenza a test was positive. Patient was initiated on Tamiflu She feels poorly today, complains of significant dry cough, wheezing in the lungs. She has left-sided chest pain, worsening with cough   Granddaughter at bedside to assist with history. Pt alert and awake, on 3L [place], CT chest pending . Granddaughter stated pt has not had recent weight loss. Weight history shows only 4 lb weight loss in 2 weeks (not significant for time frame).  Pt states she has had decreased appetite while admitted. Her tray was in the room at the time of visit and was about 25% completed, consisting of sweet potato, chicken, greens and strawberry yogurt. Pt stated for breakfast she had hard boiled egg, oatmeal with raisins, bagel with cream cheese and decaf coffee.  Per pt and family report pt consumed 3 meals/d PTA. Pt states she may have more of an appetite and  eat more with a liberalized diet and states the food does not have much taste to it. Pt said she consumed Ensure PTA and is willing to continue with supplementation while her appetite is decreased.  Per nutrition focused physical exam pt showed no muscle depletion, no fat depletion and no edema.    Labs reviewed; Serum glucose (181) Medication reviewed; 40 mg/mL Solu-medrol, 5000 units Vitamin D, 1000 mg Vitamin C, Multivitamin  Diet Order:  Diet 2 gram sodium Room service appropriate? Yes; Fluid consistency: Thin  Skin:  Reviewed, no issues  Last BM:  1/24  Height:   Ht Readings from Last 1 Encounters:  09/22/16 5\' 5"  (1.651 m)    Weight:   Wt Readings from Last 1 Encounters:  09/22/16 180 lb (81.6 kg)    Ideal Body Weight:  56.8 kg  BMI:  Body mass index is 29.95 kg/m.  Estimated Nutritional Needs:   Kcal:  [id number] (20-23 kcal/kg)   Protein:  98-114 grams (1.2-1.4g/kg)  Fluid:  >/= 2 L/d  EDUCATION NEEDS:   Education needs no appropriate at this time  [employer]

## 2016-09-25 LAB — HEMOGLOBIN A1C
Hgb A1c MFr Bld: 6.3 % — ABNORMAL HIGH (ref 4.8–5.6)
Mean Plasma Glucose: 134 mg/dL

## 2016-09-25 LAB — CREATININE, SERUM
Creatinine, Ser: 0.79 mg/dL (ref 0.44–1.00)
GFR calc Af Amer: >60 mL/min (ref >60)
GFR calc non Af Amer: >60 mL/min (ref >60)

## 2016-09-25 LAB — MRSA PCR SCREENING: MRSA by PCR: NEGATIVE

## 2016-09-25 MED ORDER — IPRATROPIUM-ALBUTEROL 0.5-2.5 (3) MG/3ML IN SOLN
3.0000 mL | Freq: Three times a day (TID) | RESPIRATORY_TRACT | Status: DC
  Administered 2016-09-26 – 2016-09-27 (×4): 3 mL via RESPIRATORY_TRACT
  Filled 2016-09-25 (×4): qty 3

## 2016-09-25 MED ORDER — AZITHROMYCIN 250 MG PO TABS
250.0000 mg | ORAL_TABLET | Freq: Once | ORAL | Status: AC
  Administered 2016-09-25: 250 mg via ORAL
  Filled 2016-09-25: qty 1

## 2016-09-25 MED ORDER — VANCOMYCIN HCL 10 G IV SOLR
1250.0000 mg | INTRAVENOUS | Status: DC
  Administered 2016-09-26 – 2016-09-27 (×2): 1250 mg via INTRAVENOUS
  Filled 2016-09-25 (×3): qty 1250

## 2016-09-25 MED ORDER — CEFTRIAXONE SODIUM-DEXTROSE 1-3.74 GM-% IV SOLR
1.0000 g | INTRAVENOUS | Status: DC
  Administered 2016-09-25 – 2016-09-27 (×3): 1 g via INTRAVENOUS
  Filled 2016-09-25 (×4): qty 50

## 2016-09-25 MED ORDER — VANCOMYCIN HCL 10 G IV SOLR
1250.0000 mg | Freq: Once | INTRAVENOUS | Status: AC
  Administered 2016-09-25: 1250 mg via INTRAVENOUS
  Filled 2016-09-25: qty 1250

## 2016-09-25 MED ORDER — DEXTROSE 5 % IV SOLN: 1.0000 g | INTRAVENOUS | Status: DC

## 2016-09-25 MED ORDER — AZITHROMYCIN 250 MG PO TABS
500.0000 mg | ORAL_TABLET | Freq: Every day | ORAL | Status: DC
  Administered 2016-09-26 – 2016-09-27 (×2): 500 mg via ORAL
  Filled 2016-09-25 (×2): qty 2

## 2016-09-25 NOTE — Progress Notes (Signed)
Pharmacy Antibiotic Note  Abigail Strong is a 83 y.o. female admitted on 09/22/2016 with pneumonia.  Pharmacy has been consulted for vancomycin dosing.  Plan: Vancomycin 1250 mg  IV every 24 hours.  Goal trough 15-20 mcg/mL. Trough before 4th dose.   Ke 0.043, half life 16.2, Vd 46.90  Height: 5\' 5"  (165.1 cm) Weight: 180 lb (81.6 kg) IBW/kg (Calculated) : 57  Temp (24hrs), Avg:97.9 F (36.6 C), Min:97.7 F (36.5 C), Max:98.1 F (36.7 C)   Recent Labs Lab 09/22/16 1837 09/23/16 0637 09/25/16 1312  WBC 17.9* 25.6*  --   CREATININE 0.97 0.87 0.79    Estimated Creatinine Clearance: 55.2 mL/min (by C-G formula based on SCr of 0.79 mg/dL).    Allergies  Allergen Reactions  . Penicillins Other (See Comments)    Dry throat Pt doesn't recall having any issues with penicillins  . Sulfonamide Derivatives     Antimicrobials this admission: Tamiflu 2/3 >> Augmentin 2/3 >>2/6 Azithromycin 2/5 >> Ceftriaxone 2/6 >> Vancomycin 2/6 >>   Dose adjustments this admission:  Microbiology results: influenza A positive BCx x2 NGTD Sputum cx ordered  Thank you for allowing pharmacy to be a part of this patient's care.  Rocky Morel 09/25/2016 2:27 PM

## 2016-09-25 NOTE — Progress Notes (Signed)
[facility]   PATIENT NAME: Abigail Strong    MR#:  [id number]  DATE OF BIRTH:  10/02/1929  SUBJECTIVE:  CHIEF COMPLAINT:   Chief Complaint  Patient presents with  . Weakness   The patient with GERD, hyperlipidemia, hypothyroidism, who , poor appetite, tremors. On arrival to the hospital, she was noted to have influenza A+. Her pulse oximetry revealed O2 sats of 88% on room air and she was admitted to the hospital. Her chest x-ray showed no pneumonia, however, CT of head revealed paranasal sinus disease, acute sinusitis, patient was initiated on antibiotic Augmentin. Influenza a test was positive. Patient was initiated on Tamiflu She feels Stable today, complains of cough, but less wheezing in the lungs. No chest pains. CT angiogram of the chest revealed no PE, but pneumonia, initiated on Rocephin, vancomycin, being continued on Zithromax. Discussed this patient's daughter extensively  Review of Systems  Constitutional: Positive for chills. Negative for fever and weight loss.  HENT: Negative for congestion.   Eyes: Negative for blurred vision and double vision.  Respiratory: Positive for cough, sputum production, shortness of breath and wheezing.   Cardiovascular: Negative for chest pain, palpitations, orthopnea, leg swelling and PND.  Gastrointestinal: Negative for abdominal pain, blood in stool, constipation, diarrhea, nausea and vomiting.  Genitourinary: Negative for dysuria, frequency, hematuria and urgency.  Musculoskeletal: Negative for falls.  Neurological: Negative for dizziness, tremors, focal weakness and headaches.  Endo/Heme/Allergies: Does not bruise/bleed easily.  Psychiatric/Behavioral: Negative for depression. The patient does not have insomnia.     VITAL SIGNS: Blood pressure 136/80, pulse 93, temperature 97.7 F (36.5 C), resp. rate 20, height 5\' 5"  (1.651 m), weight 81.6 kg (180 lb), SpO2 92 %.  PHYSICAL  EXAMINATION:   GENERAL:  83 y.o.-year-old patient lying in the bed in mild respiratory distress, tachypneic, however, more comfortable today, some dyspneic  EYES: Pupils equal, round, reactive to light and accommodation. No scleral icterus. Extraocular muscles intact.  HEENT: Head atraumatic, normocephalic. Oropharynx and nasopharynx clear.  NECK:  Supple, no jugular venous distention. No thyroid enlargement, no tenderness.  LUNGS: Better air entrance bilaterally, no wheezing, rales,rhonchi , crepitations in the right upper lung anteriorly , posteriorly. Intermittent use of accessory muscles of respiration, with the cough, speech, movements. Palpation of chest is not painful CARDIOVASCULAR: S1, S2 . Rhythm was regular. No murmurs, rubs, or gallops.  ABDOMEN: Soft, nontender, nondistended. Bowel sounds present. No organomegaly or mass.  EXTREMITIES: No pedal edema, cyanosis, or clubbing.  NEUROLOGIC: Cranial nerves II through XII are intact. Muscle strength 5/5 in all extremities. Sensation intact. Gait not checked.  PSYCHIATRIC: The patient is alert and oriented x 3.  SKIN: No obvious rash, lesion, or ulcer.   ORDERS/RESULTS REVIEWED:   CBC  Recent Labs Lab 09/22/16 1837 09/23/16 0637  WBC 17.9* 25.6*  HGB 14.8 13.5  HCT 44.0 41.1  PLT 169 152  MCV 87.4 88.1  MCH 29.4 29.0  MCHC 33.6 32.9  RDW 15.4* 15.2*  LYMPHSABS 0.5*  --   MONOABS 0.7  --   EOSABS 0.0  --   BASOSABS 0.1  --    ------------------------------------------------------------------------------------------------------------------  Chemistries   Recent Labs Lab 09/22/16 1837 09/23/16 0637 09/24/16 0540 09/25/16 1312  NA 133* 134* 135  --   K 4.9 4.9  --   --   CL 99* 99*  --   --   CO2 23 28  --   --   GLUCOSE 163*  181*  --   --   BUN 23* 24*  --   --   CREATININE 0.97 0.87  --  0.79  CALCIUM 8.4* 8.5*  --   --   AST 39  --   --   --   ALT 16  --   --   --   ALKPHOS 55  --   --   --   BILITOT 1.3*   --   --   --    ------------------------------------------------------------------------------------------------------------------ estimated creatinine clearance is 55.2 mL/min (by C-G formula based on SCr of 0.79 mg/dL). ------------------------------------------------------------------------------------------------------------------ No results for input(s): TSH, T4TOTAL, T3FREE, THYROIDAB in the last 72 hours.  Invalid input(s): FREET3  Cardiac Enzymes  Recent Labs Lab 09/22/16 1837  TROPONINI <0.03   ------------------------------------------------------------------------------------------------------------------ Invalid input(s): POCBNP ---------------------------------------------------------------------------------------------------------------  RADIOLOGY: Ct Angio Chest Pe W Or Wo Contrast  Result Date: 09/24/2016 CLINICAL DATA:  83 year old presenting with acute onset of left-sided chest pain earlier today. Current history of influenza with worsening cough. Patient also has hypoxemia. EXAM: CT ANGIOGRAPHY CHEST WITH CONTRAST TECHNIQUE: Multidetector CT imaging of the chest was performed using the standard protocol during bolus administration of intravenous contrast. Multiplanar CT image reconstructions and MIPs were obtained to evaluate the vascular anatomy. CONTRAST:  75 mL Isovue 370 IV. COMPARISON:  CT chest 09/11/2016. FINDINGS: Respiratory motion blurred many of the images. The study is of less than average technical quality. Cardiovascular: Contrast opacification of pulmonary arteries is good. However, the respiratory motion makes evaluation of subsegmental arteries difficult. No filling defects within the main pulmonary arteries or their central branches in either lung to suggest pulmonary embolism. Cardiac silhouette mildly enlarged with biatrial enlargement. Reflux of contrast into the IVC and hepatic veins. No pericardial effusion. Moderate LAD coronary atherosclerosis. Mild to  moderate atherosclerosis involving the thoracic and upper abdominal aorta without evidence of aneurysm or dissection. Mediastinum/Nodes: No pathologic lymphadenopathy. Moderate-sized hiatal hernia. Normal appearing esophagus. Thyroid gland atrophic. Lungs/Pleura: Patchy airspace opacities peripherally in the right upper lobe and right lower lobe. Scar and bronchiectasis involving the medial lower lobes bilaterally. No confluent airspace consolidation. No pulmonary parenchymal nodules or masses. No pleural effusions. Upper Abdomen: Hiatal hernia as noted above. Upper abdomen otherwise normal in appearance for the early portal venous phase of enhancement. Musculoskeletal: DISH involving the mid thoracic spine. Osseous demineralization. Degenerative disc disease and spondylosis throughout the thoracic spine and upper lumbar spine. Exaggeration of the usual thoracic kyphosis. No acute abnormalities. Review of the MIP images confirms the above findings. IMPRESSION: 1. No evidence central pulmonary embolism. 2. Patchy pneumonia involving the right upper lobe and right lower lobe. 3. Mild cardiomegaly with biatrial enlargement. Reflux of contrast into the IVC and hepatic veins likely indicates right heart disease. 4. Moderate-sized hiatal hernia. 5. Mild to moderate atherosclerosis involving the thoracic and upper abdominal aorta without evidence of aneurysm. Electronically Signed   By: Hulan Saas M.D.   On: 09/24/2016 17:17    EKG:  Orders placed or performed during the hospital encounter of 09/22/16  . ED EKG  . ED EKG  . EKG 12-Lead  . EKG 12-Lead    ASSESSMENT AND PLAN:  Active Problems:   Acute respiratory failure with hypoxia (HCC) #1. Acute respiratory failure with hypoxia due to COPD exacerbation, Pneumonia, influenza A, the patient was weaned off oxygen, O2 sats remained stable at 90-93 on room air, continue Pulmicort, DuoNeb nebs, Tussionex, Solu-Medrol, follow clinically. Changing  antibiotics to Rocephin, vancomycin and Zithromax, get sputum  cultures if possible. Discussed with patient's daughter, all questions were answered, she voiced understanding and appreciation #2. Right upper and right lower lobe pneumonia , acute sinusitis, continue Rocephin, vancomycin, Zithromax , blood cultures are negative so far, get sputum cultures if possible, some more comfortable with codeine for cough #3 influenza A, continue Tamiflu #4. Hyponatremia,  resolved with IV fluids #5. Hyperglycemia, likely steroid related, hemoglobin A1c is 6.3, prediabetic #6. Leukocytosis, likely due to steroids,, but also pneumonia, now on different antibiotics, following closely #7. Generalized weakness, physical therapist evaluated patient and recommended home health services, however, patient spends at least 8 hours at home alone due to patient's daughter working, we'll discuss with care management if any other options are available, discussed this patient's daughter #8 left-sided chest pain, due to work of breathing, cough, improved with Lidoderm patch  Management plans discussed with the patient, family and they are in agreement.   DRUG ALLERGIES:  Allergies  Allergen Reactions  . Penicillins Other (See Comments)    Dry throat Pt doesn't recall having any issues with penicillins  . Sulfonamide Derivatives     CODE STATUS:     Code Status Orders        Start     Ordered   09/22/16 2117  Full code  Continuous     09/22/16 2117    Code Status History    Date Active Date Inactive Code Status Order ID Comments User Context   09/09/2016  1:30 AM 09/14/2016  5:32 PM Full Code [id number]  Tonye Royalty, DO Inpatient   03/23/2015  2:45 AM 03/25/2015  1:28 PM Full Code [id number]  Arnaldo Natal, MD Inpatient      TOTAL TIME TAKING CARE OF THIS PATIENT: 40 minutes.   Discussed with patient's daughter Today again about treatment plan, findings of CAT scan, discharge planning, all questions  were answered, she voiced understanding and appreciation  Carrolyn Hilmes M.D on 09/25/2016 at 2:39 PM  Between 7am to 6pm - Pager - [phone]  After 6pm go to [email or web address] - password [email or web address] [facility]  [facility]  Office  [phone]  CC: Primary care physician; Corky Downs, MD

## 2016-09-25 NOTE — Progress Notes (Signed)
Physical Therapy Treatment Patient Details Name: Abigail Strong MRN: [id number] DOB: November 26, 1929 Today's Date: 09/25/2016    History of Present Illness Patient is an 83 y.o. female admitted on 03 FEB for progressive weakness. Found to have Influenza A. PMH includes depression, GERD, HLD, hypothyroidism, and insomnia.    PT Comments    Pt is able to ambulate around the nurses' station with walker and showed consistent speed and good relative confidence.  She has some fatigue with the effort, but overall did well.  She did well and should be able to return home with HHPT once medically stable.    Follow Up Recommendations  Home health PT     Equipment Recommendations  Rolling walker with 5" wheels    Recommendations for Other Services       Precautions / Restrictions Precautions Precautions: Fall Restrictions Weight Bearing Restrictions: No    Mobility  Bed Mobility Overal bed mobility: Independent             General bed mobility comments: able to get to EOB w/o assist  Transfers Overall transfer level: Modified independent Equipment used: Rolling walker (2 wheeled)             General transfer comment: Pt able to rise to standing with minimal UE use and showed good balance and confidence getting to standing  Ambulation/Gait Ambulation/Gait assistance: Min guard Ambulation Distance (Feet): 200 Feet Assistive device: Rolling walker (2 wheeled)       General Gait Details: Pt did well with ambulation and showed good confidence and safety with circumambulation of nurses' station.  Pt had some fatigue, but her O2 remained in the 90s with the effort.    Stairs            Wheelchair Mobility    Modified Rankin (Stroke Patients Only)       Balance Overall balance assessment: Modified Independent                                  Cognition Arousal/Alertness: Awake/alert Behavior During Therapy: WFL for tasks  assessed/performed Overall Cognitive Status: Within Functional Limits for tasks assessed                      Exercises General Exercises - Lower Extremity Ankle Circles/Pumps: AROM;10 reps Long Arc Quad: Strengthening;10 reps Heel Slides: Strengthening;10 reps Hip ABduction/ADduction: Strengthening;10 reps Hip Flexion/Marching: Strengthening;10 reps    General Comments        Pertinent Vitals/Pain Pain Assessment: No/denies pain    Home Living                      Prior Function            PT Goals (current goals can now be found in the care plan section) Progress towards PT goals: Progressing toward goals    Frequency    Min 2X/week      PT Plan Current plan remains appropriate    Co-evaluation             End of Session Equipment Utilized During Treatment: Gait belt Activity Tolerance: Patient tolerated treatment well Patient left: in chair;with call bell/phone within reach;with chair alarm set     Time: 1542-1610 PT Time Calculation (min) (ACUTE ONLY): 28 min  Charges:  $Gait Training: 8-22 mins $Therapeutic Exercise: 8-22 mins  G Codes:      Malachi Pro, DPT 09/25/2016, 5:16 PM
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
## 2016-09-26 LAB — CBC
HCT: 35.9 % (ref 35.0–47.0)
Hemoglobin: 12 g/dL (ref 12.0–16.0)
MCH: 29.3 pg (ref 26.0–34.0)
MCHC: 33.4 g/dL (ref 32.0–36.0)
MCV: 87.5 fL (ref 80.0–100.0)
Platelets: 155 10*3/uL (ref 150–440)
RBC: 4.1 MIL/uL (ref 3.80–5.20)
RDW: 14.7 % — ABNORMAL HIGH (ref 11.5–14.5)
WBC: 14.9 10*3/uL — ABNORMAL HIGH (ref 3.6–11.0)

## 2016-09-26 LAB — PROCALCITONIN: Procalcitonin: 0.58 ng/mL

## 2016-09-26 MED ORDER — NYSTATIN 100000 UNIT/GM EX POWD
Freq: Two times a day (BID) | CUTANEOUS | Status: DC
  Administered 2016-09-26 – 2016-09-27 (×3): via TOPICAL
  Filled 2016-09-26: qty 15

## 2016-09-26 MED ORDER — CODEINE SULFATE 15 MG PO TABS: 15.0000 mg | ORAL_TABLET | ORAL | Status: DC | PRN

## 2016-09-26 NOTE — Progress Notes (Signed)
[facility] [place] at [facility]   PATIENT NAME: Abigail Strong    MR#:  [id number]  DATE OF BIRTH:  May 22, 1930  SUBJECTIVE:  CHIEF COMPLAINT:   Chief Complaint  Patient presents with  . Weakness   The patient with GERD, hyperlipidemia, hypothyroidism, who , poor appetite, tremors. On arrival to the hospital, she was noted to have influenza A+. Her pulse oximetry revealed O2 sats of 88% on room air and she was admitted to the hospital. Her chest x-ray showed no pneumonia, however, CT of head revealed paranasal sinus disease, acute sinusitis, patient was initiated on antibiotic Augmentin. Influenza a test was positive. Patient was initiated on Tamiflu She feels Stable today, complains of cough, but less wheezing in the lungs. No chest pains. CT angiogram of the chest revealed no PE, but pneumonia, initiated on Rocephin, vancomycin, being continued on Zithromax. She feels better today, less shortness of breath, continues to have some cough, dry mostly was no significant sputum production, less wheezing   Review of Systems  Constitutional: Negative for fever and weight loss.  HENT: Negative for congestion.   Eyes: Negative for blurred vision and double vision.  Respiratory: Positive for cough.   Cardiovascular: Negative for chest pain, palpitations, orthopnea, leg swelling and PND.  Gastrointestinal: Negative for abdominal pain, blood in stool, constipation, diarrhea, nausea and vomiting.  Genitourinary: Negative for dysuria, frequency, hematuria and urgency.  Musculoskeletal: Negative for falls.  Neurological: Negative for dizziness, tremors, focal weakness and headaches.  Endo/Heme/Allergies: Does not bruise/bleed easily.  Psychiatric/Behavioral: Negative for depression. The patient does not have insomnia.     VITAL SIGNS: Blood pressure 129/74, pulse 84, temperature 97.7 F (36.5 C), temperature source Oral, resp. rate 18, height 5\' 5"  (1.651 m), weight  81.6 kg (180 lb), SpO2 93 %.  PHYSICAL EXAMINATION:   GENERAL:  83 y.o.-year-old patient lying in the bed in mild respiratory distress, tachypneic,  EYES: Pupils equal, round, reactive to light and accommodation. No scleral icterus. Extraocular muscles intact.  HEENT: Head atraumatic, normocephalic. Oropharynx and nasopharynx clear.  NECK:  Supple, no jugular venous distention. No thyroid enlargement, no tenderness.  LUNGS: Better air entrance bilaterally, no wheezing, rales,rhonchi , crepitations in the right upper lung posteriorly, but not anteriorly. Intermittent use of accessory muscles of respiration, with the cough, speech, movements. Palpation of chest is not painful CARDIOVASCULAR: S1, S2 . Rhythm was regular. No murmurs, rubs, or gallops.  ABDOMEN: Soft, nontender, nondistended. Bowel sounds present. No organomegaly or mass.  EXTREMITIES: No pedal edema, cyanosis, or clubbing.  NEUROLOGIC: Cranial nerves II through XII are intact. Muscle strength 5/5 in all extremities. Sensation intact. Gait not checked.  PSYCHIATRIC: The patient is alert and oriented x 3.  SKIN: No obvious rash, lesion, or ulcer.   ORDERS/RESULTS REVIEWED:   CBC  Recent Labs Lab 09/22/16 1837 09/23/16 0637 09/26/16 0533  WBC 17.9* 25.6* 14.9*  HGB 14.8 13.5 12.0  HCT 44.0 41.1 35.9  PLT 169 152 155  MCV 87.4 88.1 87.5  MCH 29.4 29.0 29.3  MCHC 33.6 32.9 33.4  RDW 15.4* 15.2* 14.7*  LYMPHSABS 0.5*  --   --   MONOABS 0.7  --   --   EOSABS 0.0  --   --   BASOSABS 0.1  --   --    ------------------------------------------------------------------------------------------------------------------  Chemistries   Recent Labs Lab 09/22/16 1837 09/23/16 0637 09/24/16 0540 09/25/16 1312  NA 133* 134* 135  --   K 4.9 4.9  --   --  CL 99* 99*  --   --   CO2 23 28  --   --   GLUCOSE 163* 181*  --   --   BUN 23* 24*  --   --   CREATININE 0.97 0.87  --  0.79  CALCIUM 8.4* 8.5*  --   --   AST 39  --    --   --   ALT 16  --   --   --   ALKPHOS 55  --   --   --   BILITOT 1.3*  --   --   --    ------------------------------------------------------------------------------------------------------------------ estimated creatinine clearance is 55.2 mL/min (by C-G formula based on SCr of 0.79 mg/dL). ------------------------------------------------------------------------------------------------------------------ No results for input(s): TSH, T4TOTAL, T3FREE, THYROIDAB in the last 72 hours.  Invalid input(s): FREET3  Cardiac Enzymes  Recent Labs Lab 09/22/16 1837  TROPONINI <0.03   ------------------------------------------------------------------------------------------------------------------ Invalid input(s): POCBNP ---------------------------------------------------------------------------------------------------------------  RADIOLOGY: Ct Angio Chest Pe W Or Wo Contrast  Result Date: 09/24/2016 CLINICAL DATA:  83 year old presenting with acute onset of left-sided chest pain earlier today. Current history of influenza with worsening cough. Patient also has hypoxemia. EXAM: CT ANGIOGRAPHY CHEST WITH CONTRAST TECHNIQUE: Multidetector CT imaging of the chest was performed using the standard protocol during bolus administration of intravenous contrast. Multiplanar CT image reconstructions and MIPs were obtained to evaluate the vascular anatomy. CONTRAST:  75 mL Isovue 370 IV. COMPARISON:  CT chest 09/11/2016. FINDINGS: Respiratory motion blurred many of the images. The study is of less than average technical quality. Cardiovascular: Contrast opacification of pulmonary arteries is good. However, the respiratory motion makes evaluation of subsegmental arteries difficult. No filling defects within the main pulmonary arteries or their central branches in either lung to suggest pulmonary embolism. Cardiac silhouette mildly enlarged with biatrial enlargement. Reflux of contrast into the IVC and hepatic  veins. No pericardial effusion. Moderate LAD coronary atherosclerosis. Mild to moderate atherosclerosis involving the thoracic and upper abdominal aorta without evidence of aneurysm or dissection. Mediastinum/Nodes: No pathologic lymphadenopathy. Moderate-sized hiatal hernia. Normal appearing esophagus. Thyroid gland atrophic. Lungs/Pleura: Patchy airspace opacities peripherally in the right upper lobe and right lower lobe. Scar and bronchiectasis involving the medial lower lobes bilaterally. No confluent airspace consolidation. No pulmonary parenchymal nodules or masses. No pleural effusions. Upper Abdomen: Hiatal hernia as noted above. Upper abdomen otherwise normal in appearance for the early portal venous phase of enhancement. Musculoskeletal: DISH involving the mid thoracic spine. Osseous demineralization. Degenerative disc disease and spondylosis throughout the thoracic spine and upper lumbar spine. Exaggeration of the usual thoracic kyphosis. No acute abnormalities. Review of the MIP images confirms the above findings. IMPRESSION: 1. No evidence central pulmonary embolism. 2. Patchy pneumonia involving the right upper lobe and right lower lobe. 3. Mild cardiomegaly with biatrial enlargement. Reflux of contrast into the IVC and hepatic veins likely indicates right heart disease. 4. Moderate-sized hiatal hernia. 5. Mild to moderate atherosclerosis involving the thoracic and upper abdominal aorta without evidence of aneurysm. Electronically Signed   By: Hulan Saas M.D.   On: 09/24/2016 17:17    EKG:  Orders placed or performed during the hospital encounter of 09/22/16  . ED EKG  . ED EKG  . EKG 12-Lead  . EKG 12-Lead    ASSESSMENT AND PLAN:  Active Problems:   Acute respiratory failure with hypoxia (HCC) #1. Acute respiratory failure with hypoxia due to COPD exacerbation, Pneumonia, influenza A, the patient was weaned off oxygen, O2 sats remained stable at  90-93 on room air, continue  Pulmicort, DuoNeb nebs, Tussionex, Solu-Medrol, Improved clinically. Continue  Zithromax, get sputum cultures if possible. Discussed with patient's daughter, all questions were answered, she voiced understanding and appreciation #2. Right upper and right lower lobe pneumonia , acute sinusitis, continue Rocephin, vancomycin, Zithromax , blood cultures are negative so far,  awaiting for sputum culture results, continue  codeine for cough #3 influenza A, continue Tamiflu #4. Hyponatremia,  resolved with IV fluids #5. Hyperglycemia, likely steroid related, hemoglobin A1c is 6.3, prediabetic #6. Leukocytosis, likely due to steroids,, but also pneumonia, now on different antibiotics,  improved,following closely #7. Generalized weakness, physical therapist evaluated patient and recommended home health services, however, patient spends at least 8 hours at home alone due to patient's daughter working, continue physical therapy while in   Our facility #8 left-sided chest pain, due to work of breathing, cough, improved with Lidoderm patch  Management plans discussed with the patient, family and they are in agreement.   DRUG ALLERGIES:  Allergies  Allergen Reactions  . Penicillins Other (See Comments)    Dry throat Pt doesn't recall having any issues with penicillins  . Sulfonamide Derivatives     CODE STATUS:     Code Status Orders        Start     Ordered   09/22/16 2117  Full code  Continuous     09/22/16 2117    Code Status History    Date Active Date Inactive Code Status Order ID Comments User Context   09/09/2016  1:30 AM 09/14/2016  5:32 PM Full Code [id number]  Tonye Royalty, DO Inpatient   03/23/2015  2:45 AM 03/25/2015  1:28 PM Full Code [id number]  Arnaldo Natal, MD Inpatient      TOTAL TIME TAKING CARE OF THIS PATIENT: 30 minutes.     Katharina Caper M.D on 09/26/2016 at 2:20 PM  Between 7am to 6pm - Pager - [phone]  After 6pm go to [email or web address] - password [email or web address]  [facility]  [facility]  Office  [phone]  CC: Primary care physician; Corky Downs, MD

## 2016-09-27 DIAGNOSIS — R7303 Prediabetes: Secondary | ICD-10-CM

## 2016-09-27 DIAGNOSIS — D72829 Elevated white blood cell count, unspecified: Secondary | ICD-10-CM

## 2016-09-27 DIAGNOSIS — J101 Influenza due to other identified influenza virus with other respiratory manifestations: Secondary | ICD-10-CM

## 2016-09-27 DIAGNOSIS — J189 Pneumonia, unspecified organism: Secondary | ICD-10-CM

## 2016-09-27 DIAGNOSIS — J441 Chronic obstructive pulmonary disease with (acute) exacerbation: Secondary | ICD-10-CM

## 2016-09-27 DIAGNOSIS — R0789 Other chest pain: Secondary | ICD-10-CM

## 2016-09-27 DIAGNOSIS — R5382 Chronic fatigue, unspecified: Secondary | ICD-10-CM

## 2016-09-27 HISTORY — DX: Other chest pain: R07.89

## 2016-09-27 LAB — CULTURE, BLOOD (ROUTINE X 2)
Culture: NO GROWTH
Culture: NO GROWTH

## 2016-09-27 MED ORDER — FLUTICASONE-SALMETEROL 250-50 MCG/DOSE IN AEPB: 1.0000 | INHALATION_SPRAY | Freq: Two times a day (BID) | RESPIRATORY_TRACT | 6 refills | Status: DC

## 2016-09-27 MED ORDER — ALBUTEROL SULFATE (2.5 MG/3ML) 0.083% IN NEBU: 2.5000 mg | INHALATION_SOLUTION | RESPIRATORY_TRACT | 12 refills | Status: DC | PRN

## 2016-09-27 MED ORDER — IPRATROPIUM-ALBUTEROL 0.5-2.5 (3) MG/3ML IN SOLN
3.0000 mL | RESPIRATORY_TRACT | Status: DC
  Administered 2016-09-27 (×2): 3 mL via RESPIRATORY_TRACT
  Filled 2016-09-27 (×2): qty 3

## 2016-09-27 MED ORDER — PREDNISONE 10 MG (21) PO TBPK: 10.0000 mg | ORAL_TABLET | Freq: Every day | ORAL | 0 refills | Status: DC

## 2016-09-27 MED ORDER — HYDROCOD POLST-CPM POLST ER 10-8 MG/5ML PO SUER: 5.0000 mL | Freq: Two times a day (BID) | ORAL | 0 refills | Status: DC

## 2016-09-27 MED ORDER — ALBUTEROL SULFATE (2.5 MG/3ML) 0.083% IN NEBU: 2.5000 mg | INHALATION_SOLUTION | Freq: Four times a day (QID) | RESPIRATORY_TRACT | 12 refills | Status: DC | PRN

## 2016-09-27 MED ORDER — LEVOFLOXACIN 500 MG PO TABS: 500.0000 mg | ORAL_TABLET | Freq: Every day | ORAL | 0 refills | Status: DC

## 2016-09-27 MED ORDER — BUDESONIDE 0.25 MG/2ML IN SUSP: 0.2500 mg | Freq: Two times a day (BID) | RESPIRATORY_TRACT | 12 refills | Status: DC

## 2016-09-27 MED ORDER — BUDESONIDE-FORMOTEROL FUMARATE 160-4.5 MCG/ACT IN AERO: 2.0000 | INHALATION_SPRAY | Freq: Two times a day (BID) | RESPIRATORY_TRACT | 12 refills | Status: DC

## 2016-09-27 MED ORDER — NYSTATIN 100000 UNIT/GM EX POWD: Freq: Two times a day (BID) | CUTANEOUS | 0 refills | Status: DC

## 2016-09-27 MED ORDER — ENSURE ENLIVE PO LIQD: 237.0000 mL | Freq: Two times a day (BID) | ORAL | 12 refills | Status: DC

## 2016-09-27 NOTE — Discharge Summary (Signed)
[facility] - [place] at [facility]   PATIENT NAME: Abigail Strong    MR#:  [id number]  DATE OF BIRTH:  Aug 29, 1929  DATE OF ADMISSION:  09/22/2016 ADMITTING PHYSICIAN: Alford Highland, MD  DATE OF DISCHARGE: No discharge date for patient encounter.  PRIMARY CARE PHYSICIAN: MASOUD,JAVED, MD     ADMISSION DIAGNOSIS:  Weakness [R53.1] SOB (shortness of breath) [R06.02] Tachycardia [R00.0] Hypoxia [R09.02] Speech abnormality [R47.9] Influenza [J11.1] Fever, unspecified fever cause [R50.9]  DISCHARGE DIAGNOSIS:  Principal Problem:   Acute respiratory failure with hypoxia (HCC) Active Problems:   COPD exacerbation (HCC)   Pneumonia   Influenza A   Leukocytosis   Prediabetes   Generalized weakness   Musculoskeletal chest pain   SECONDARY DIAGNOSIS:   Past Medical History:  Diagnosis Date  . Depression   . GERD (gastroesophageal reflux disease)   . Hyperlipidemia   . Hypothyroidism   . Insomnia     .pro HOSPITAL COURSE:  The patient with GERD, hyperlipidemia, hypothyroidism, who , poor appetite, tremors. On arrival to the hospital, she was noted to have influenza A+. Her pulse oximetry revealed O2 sats of 88% on room air and she was admitted to the hospital. Her chest x-ray showed no pneumonia, CT of head revealed paranasal sinus disease, acute sinusitis, patient was initiated on antibiotic Augmentin. Influenza A test was positive. Patient was initiated on Tamiflu Spite antibiotic therapy and Tamiflu, patient continued to have wheezing, shortness of breath, cough, hypoxia. She underwent CT angiogram of the chest, revealing no PE, but pneumonia, she was initiated on Rocephin, vancomycin,  Zithromax and clinically improved. She was seen by pulmonologist while in the hospital. She was evaluated by physical therapist and recommended home health services. She was felt to be stable to be discharged home today Discussion by problem: #1. Acute respiratory  failure with hypoxia due to COPD exacerbation, Pneumonia, influenza A, the patient was weaned off oxygen, O2 sats remained stable at 90-93 on room air, the patient is to continue Pulmicort, DuoNeb nebs, Tussionex, steroid taper, levofloxacin for 5 more days to complete course, unfortunately we were not able to obtain sputum cultures, however, blood cultures were negative, MRSA PCR was negative. The patient is to follow-up with primary care physician for further recommendations. She would benefit from pulmonologist follow-up as outpatient as well.  #2. Right upper and right lower lobe pneumonia , acute sinusitis, continue levofloxacin for 5 more days, blood cultures were negative so far,  awaiting for sputum culture , continue   Tussionex for cough suppression, Humibid #3 influenza A, the patient completed 5 days of Tamiflu #4. Hyponatremia,  resolved with IV fluids #5. Hyperglycemia, likely steroid related, hemoglobin A1c is 6.3, prediabetic #6. Leukocytosis, likely due to steroids,, but also pneumonia,   improved, following as outpatient after steroid taper #7. Generalized weakness, physical therapist evaluated patient and recommended home health services #8 left-sided chest pain, due to work of breathing, cough, improved with Lidoderm patch, continue cough suppressant medications   DISCHARGE CONDITIONS:   Stable  CONSULTS OBTAINED:  Treatment Team:  Mertie Moores, MD  DRUG ALLERGIES:   Allergies  Allergen Reactions  . Penicillins Other (See Comments)    Dry throat Pt doesn't recall having any issues with penicillins  . Sulfonamide Derivatives     DISCHARGE MEDICATIONS:   Current Discharge Medication List    START taking these medications   Details  budesonide (PULMICORT) 0.25 MG/2ML nebulizer solution Take 2 mLs (0.25 mg total) by nebulization 2 (  two) times daily. Qty: 60 mL, Refills: 12    feeding supplement, ENSURE ENLIVE, (ENSURE ENLIVE) LIQD Take 237 mLs by mouth 2 (two)  times daily between meals. Qty: 237 mL, Refills: 12    levofloxacin (LEVAQUIN) 500 MG tablet Take 1 tablet (500 mg total) by mouth daily. Qty: 5 tablet, Refills: 0    nystatin (MYCOSTATIN/NYSTOP) powder Apply topically 2 (two) times daily. Qty: 15 g, Refills: 0      CONTINUE these medications which have CHANGED   Details  albuterol (PROVENTIL) (2.5 MG/3ML) 0.083% nebulizer solution Take 3 mLs (2.5 mg total) by nebulization every 4 (four) hours as needed for wheezing or shortness of breath. Qty: 360 vial, Refills: 12    chlorpheniramine-HYDROcodone (TUSSIONEX) 10-8 MG/5ML SUER Take 5 mLs by mouth 2 (two) times daily. Qty: 140 mL, Refills: 0    predniSONE (STERAPRED UNI-PAK 21 TAB) 10 MG (21) TBPK tablet Take 1 tablet (10 mg total) by mouth daily. Please take 6 pills in the morning on the days 1 and 2, then taper by one pill every 2 days until finished, thank you Qty: 42 tablet, Refills: 0      CONTINUE these medications which have NOT CHANGED   Details  acetaminophen (TYLENOL) 500 MG tablet Take 1,000 mg by mouth every 6 (six) hours as needed.    Ascorbic Acid (VITAMIN C) 1000 MG tablet Take 1,000 mg by mouth daily.    aspirin EC 81 MG tablet Take 81 mg by mouth daily.    Cholecalciferol (VITAMIN D3) 5000 UNITS CAPS Take 5,000 Units by mouth daily.    citalopram (CELEXA) 20 MG tablet Take 20 mg by mouth daily.    Ginkgo Biloba 120 MG CAPS Take 120 mg by mouth daily.    guaiFENesin (MUCINEX) 600 MG 12 hr tablet Take 1 tablet (600 mg total) by mouth 2 (two) times daily. Qty: 30 tablet, Refills: 0    levothyroxine (SYNTHROID, LEVOTHROID) 125 MCG tablet Take 125 mcg by mouth daily before breakfast.    meclizine (ANTIVERT) 12.5 MG tablet Take 12.5 mg by mouth 2 (two) times daily.    Multiple Vitamins-Minerals (CENTRUM SILVER ADULT 50+) TABS Take 1 tablet by mouth daily.    omeprazole (PRILOSEC) 40 MG capsule Take 40 mg by mouth 2 (two) times daily.    pravastatin (PRAVACHOL)  40 MG tablet Take 40 mg by mouth at bedtime.    traZODone (DESYREL) 50 MG tablet Take 50 mg by mouth at bedtime.    zolpidem (AMBIEN) 10 MG tablet Take 5 mg by mouth at bedtime as needed for sleep.         DISCHARGE INSTRUCTIONS:    The patient is to follow-up with primary care physician and pulmonologist as outpatient  If you experience worsening of your admission symptoms, develop shortness of breath, life threatening emergency, suicidal or homicidal thoughts you must seek medical attention immediately by calling 911 or calling your MD immediately  if symptoms less severe.  You Must read complete instructions/literature along with all the possible adverse reactions/side effects for all the Medicines you take and that have been prescribed to you. Take any new Medicines after you have completely understood and accept all the possible adverse reactions/side effects.   Please note  You were cared for by a hospitalist during your hospital stay. If you have any questions about your discharge medications or the care you received while you were in the hospital after you are discharged, you can call the unit and asked  to speak with the hospitalist on call if the hospitalist that took care of you is not available. Once you are discharged, your primary care physician will handle any further medical issues. Please note that NO REFILLS for any discharge medications will be authorized once you are discharged, as it is imperative that you return to your primary care physician (or establish a relationship with a primary care physician if you do not have one) for your aftercare needs so that they can reassess your need for medications and monitor your lab values.    Today   CHIEF COMPLAINT:   Chief Complaint  Patient presents with  . Weakness    HISTORY OF PRESENT ILLNESS:  Abigail Strong  is a 83 y.o. female with a known history of GERD, hyperlipidemia, hypothyroidism, who , poor appetite,  tremors. On arrival to the hospital, she was noted to have influenza A+. Her pulse oximetry revealed O2 sats of 88% on room air and she was admitted to the hospital. Her chest x-ray showed no pneumonia, CT of head revealed paranasal sinus disease, acute sinusitis, patient was initiated on antibiotic Augmentin. Influenza A test was positive. Patient was initiated on Tamiflu Spite antibiotic therapy and Tamiflu, patient continued to have wheezing, shortness of breath, cough, hypoxia. She underwent CT angiogram of the chest, revealing no PE, but pneumonia, she was initiated on Rocephin, vancomycin,  Zithromax and clinically improved. She was seen by pulmonologist while in the hospital. She was evaluated by physical therapist and recommended home health services. She was felt to be stable to be discharged home today Discussion by problem: #1. Acute respiratory failure with hypoxia due to COPD exacerbation, Pneumonia, influenza A, the patient was weaned off oxygen, O2 sats remained stable at 90-93 on room air, the patient is to continue Pulmicort, DuoNeb nebs, Tussionex, steroid taper, levofloxacin for 5 more days to complete course, unfortunately we were not able to obtain sputum cultures, however, blood cultures were negative, MRSA PCR was negative. The patient is to follow-up with primary care physician for further recommendations. She would benefit from pulmonologist follow-up as outpatient as well.  #2. Right upper and right lower lobe pneumonia , acute sinusitis, continue levofloxacin for 5 more days, blood cultures were negative so far,  awaiting for sputum culture , continue   Tussionex for cough suppression, Humibid #3 influenza A, the patient completed 5 days of Tamiflu #4. Hyponatremia,  resolved with IV fluids #5. Hyperglycemia, likely steroid related, hemoglobin A1c is 6.3, prediabetic #6. Leukocytosis, likely due to steroids,, but also pneumonia,   improved, following as outpatient after steroid  taper #7. Generalized weakness, physical therapist evaluated patient and recommended home health services #8 left-sided chest pain, due to work of breathing, cough, improved with Lidoderm patch, continue cough suppressant medications    VITAL SIGNS:  Blood pressure (!) 122/53, pulse 90, temperature 97.4 F (36.3 C), temperature source Oral, resp. rate (!) 26, height 5\' 5"  (1.651 m), weight 81.6 kg (180 lb), SpO2 97 %.  I/O:   Intake/Output Summary (Last 24 hours) at 09/27/16 1326 Last data filed at 09/26/16 2132  Gross per 24 hour  Intake              100 ml  Output              100 ml  Net                0 ml    PHYSICAL EXAMINATION:  GENERAL:  83 y.o.-year-old patient  lying in the bed with no acute distress.  EYES: Pupils equal, round, reactive to light and accommodation. No scleral icterus. Extraocular muscles intact.  HEENT: Head atraumatic, normocephalic. Oropharynx and nasopharynx clear.  NECK:  Supple, no jugular venous distention. No thyroid enlargement, no tenderness.  LUNGS: Normal breath sounds bilaterally, Few scattered wheezing, but no rales,rhonchi or crepitation. No use of accessory muscles of respiration.  CARDIOVASCULAR: S1, S2 normal. No murmurs, rubs, or gallops.  ABDOMEN: Soft, non-tender, non-distended. Bowel sounds present. No organomegaly or mass.  EXTREMITIES: No pedal edema, cyanosis, or clubbing.  NEUROLOGIC: Cranial nerves II through XII are intact. Muscle strength 5/5 in all extremities. Sensation intact. Gait not checked.  PSYCHIATRIC: The patient is alert and oriented x 3.  SKIN: No obvious rash, lesion, or ulcer.   DATA REVIEW:   CBC  Recent Labs Lab 09/26/16 0533  WBC 14.9*  HGB 12.0  HCT 35.9  PLT 155    Chemistries   Recent Labs Lab 09/22/16 1837 09/23/16 0637 09/24/16 0540 09/25/16 1312  NA 133* 134* 135  --   K 4.9 4.9  --   --   CL 99* 99*  --   --   CO2 23 28  --   --   GLUCOSE 163* 181*  --   --   BUN 23* 24*  --   --    CREATININE 0.97 0.87  --  0.79  CALCIUM 8.4* 8.5*  --   --   AST 39  --   --   --   ALT 16  --   --   --   ALKPHOS 55  --   --   --   BILITOT 1.3*  --   --   --     Cardiac Enzymes  Recent Labs Lab 09/22/16 1837  TROPONINI <0.03    Microbiology Results  Results for orders placed or performed during the hospital encounter of 09/22/16  Culture, blood (routine x 2)     Status: None   Collection Time: 09/22/16 10:29 PM  Result Value Ref Range Status   Specimen Description BLOOD RIGHT HAND  Final   Special Requests BOTTLES DRAWN AEROBIC AND ANAEROBIC 5CCAERO,1CCANA  Final   Culture NO GROWTH 5 DAYS  Final   Report Status 09/27/2016 FINAL  Final  Culture, blood (routine x 2)     Status: None   Collection Time: 09/22/16 10:29 PM  Result Value Ref Range Status   Specimen Description BLOOD RIGHT ANTECUBITAL  Final   Special Requests BOTTLES DRAWN AEROBIC AND ANAEROBIC 4CCAERO,3CCANA  Final   Culture NO GROWTH 5 DAYS  Final   Report Status 09/27/2016 FINAL  Final  MRSA PCR Screening     Status: None   Collection Time: 09/25/16  6:15 PM  Result Value Ref Range Status   MRSA by PCR NEGATIVE NEGATIVE Final    Comment:        The GeneXpert MRSA Assay (FDA approved for NASAL specimens only), is one component of a comprehensive MRSA colonization surveillance program. It is not intended to diagnose MRSA infection nor to guide or monitor treatment for MRSA infections.     RADIOLOGY:  No results found.  EKG:   Orders placed or performed during the hospital encounter of 09/22/16  . ED EKG  . ED EKG  . EKG 12-Lead  . EKG 12-Lead      Management plans discussed with the patient, family and they are in agreement.  CODE STATUS:  Code Status Orders        Start     Ordered   09/22/16 2117  Full code  Continuous     09/22/16 2117    Code Status History    Date Active Date Inactive Code Status Order ID Comments User Context   09/09/2016  1:30 AM 09/14/2016  5:32  PM Full Code [id number]  Tonye Royalty, DO Inpatient   03/23/2015  2:45 AM 03/25/2015  1:28 PM Full Code [id number]  Arnaldo Natal, MD Inpatient      TOTAL TIME TAKING CARE OF THIS PATIENT: 40 minutes.    Katharina Caper M.D on 09/27/2016 at 1:26 PM  Between 7am to 6pm - Pager - [phone]  After 6pm go to [email or web address] - password [email or web address] [facility]  [facility] [facility]  Office  [phone]  CC: Primary care physician; Corky Downs, MD

## 2016-09-27 NOTE — Care Management (Signed)
Patient discharged home today.  Patient lives at home with daughter and grandson.  Patient states that she has CPAP, rollator, and BSC in the home.  Patient did not qualify for home O2.  PCP [place].  Pharmacy [address].  PT has assessed patient and recommended home health PT and RW.  RW delivered to room prior to discharge.  Patient was offered home health agency preference.  Patient states that she did not have preference of agency.  Referral made to [facility] with Nanine Means.  RNCM signing off.

## 2016-09-27 NOTE — Care Management Important Message (Signed)
Important Message  Patient Details  Name: Abigail Strong MRN: [id number] Date of Birth: [birth year]   Medicare Important Message Given:  Yes    Beverly Sessions, RN 09/27/2016, 4:58 PM

## 2016-09-27 NOTE — Progress Notes (Signed)
Physical Therapy Treatment Patient Details Name: Abigail Strong MRN: [id number] DOB: [birth year] Today's Date: 09/27/2016    History of Present Illness Patient is an 83 y.o. female admitted on 03 FEB for progressive weakness. Found to have Influenza A. PMH includes depression, GERD, HLD, hypothyroidism, and insomnia.    PT Comments    Ready for session.  Feeling better.  Pt was able to stand and ambulate around nursing station x 2 with min guard.  O2 sats on room air 94% 108 P.  Upon return to room she ambulated in and out of bathroom without assistive device and no lob's.  Overall tolerated activity well.  Stated she is confident regarding discharge plan.   Follow Up Recommendations  Home health PT     Equipment Recommendations  Rolling walker with 5" wheels    Recommendations for Other Services       Precautions / Restrictions Precautions Precautions: Fall Restrictions Weight Bearing Restrictions: No    Mobility  Bed Mobility               General bed mobility comments: in chair upon arrival  Transfers Overall transfer level: Modified independent Equipment used: Rolling walker (2 wheeled)             General transfer comment: Pt able to rise to standing with minimal UE use and showed good balance and confidence getting to standing  Ambulation/Gait Ambulation/Gait assistance: Min guard Ambulation Distance (Feet): 450 Feet Assistive device: Rolling walker (2 wheeled)     Gait velocity interpretation: Below normal speed for age/gender General Gait Details: does well with walker and encourage her to use it upon discharge.     Stairs            Wheelchair Mobility    Modified Rankin (Stroke Patients Only)       Balance Overall balance assessment: Modified Independent                                  Cognition Arousal/Alertness: Awake/alert Behavior During Therapy: WFL for tasks assessed/performed Overall Cognitive Status:  Within Functional Limits for tasks assessed                      Exercises Other Exercises Other Exercises: to bathroom without assistive device and min guard.    General Comments        Pertinent Vitals/Pain Pain Assessment: No/denies pain    Home Living                      Prior Function            PT Goals (current goals can now be found in the care plan section) Progress towards PT goals: Progressing toward goals    Frequency    Min 2X/week      PT Plan Current plan remains appropriate    Co-evaluation             End of Session Equipment Utilized During Treatment: Gait belt Activity Tolerance: Patient tolerated treatment well Patient left: in chair;with call bell/phone within reach;with chair alarm set     Time: 1030-1058 PT Time Calculation (min) (ACUTE ONLY): 28 min  Charges:  $Gait Training: 8-22 mins $Therapeutic Activity: 8-22 mins                    G Codes:      Analycia Khokhar  Kailan Laws 09/27/2016, 12:21 PM

## 2016-09-27 NOTE — Progress Notes (Signed)
Alert and oriented. Vital signs stable . No signs of acute distress. Discharge instructions given. Patient verbalized understanding. No other issues noted at this time.    

## 2016-09-29 DIAGNOSIS — I10 Essential (primary) hypertension: Secondary | ICD-10-CM | POA: Diagnosis not present

## 2016-09-29 DIAGNOSIS — J441 Chronic obstructive pulmonary disease with (acute) exacerbation: Secondary | ICD-10-CM | POA: Diagnosis not present

## 2016-09-29 DIAGNOSIS — J440 Chronic obstructive pulmonary disease with acute lower respiratory infection: Secondary | ICD-10-CM | POA: Diagnosis not present

## 2016-09-29 DIAGNOSIS — Z7982 Long term (current) use of aspirin: Secondary | ICD-10-CM | POA: Diagnosis not present

## 2016-10-01 DIAGNOSIS — J441 Chronic obstructive pulmonary disease with (acute) exacerbation: Secondary | ICD-10-CM | POA: Diagnosis not present

## 2016-10-01 DIAGNOSIS — J440 Chronic obstructive pulmonary disease with acute lower respiratory infection: Secondary | ICD-10-CM | POA: Diagnosis not present

## 2016-10-01 DIAGNOSIS — I10 Essential (primary) hypertension: Secondary | ICD-10-CM | POA: Diagnosis not present

## 2016-10-01 DIAGNOSIS — Z7982 Long term (current) use of aspirin: Secondary | ICD-10-CM | POA: Diagnosis not present

## 2016-10-03 DIAGNOSIS — J441 Chronic obstructive pulmonary disease with (acute) exacerbation: Secondary | ICD-10-CM | POA: Diagnosis not present

## 2016-10-03 DIAGNOSIS — Z7982 Long term (current) use of aspirin: Secondary | ICD-10-CM | POA: Diagnosis not present

## 2016-10-03 DIAGNOSIS — I10 Essential (primary) hypertension: Secondary | ICD-10-CM | POA: Diagnosis not present

## 2016-10-03 DIAGNOSIS — J440 Chronic obstructive pulmonary disease with acute lower respiratory infection: Secondary | ICD-10-CM | POA: Diagnosis not present

## 2016-10-04 DIAGNOSIS — I10 Essential (primary) hypertension: Secondary | ICD-10-CM | POA: Diagnosis not present

## 2016-10-04 DIAGNOSIS — J440 Chronic obstructive pulmonary disease with acute lower respiratory infection: Secondary | ICD-10-CM | POA: Diagnosis not present

## 2016-10-04 DIAGNOSIS — Z7982 Long term (current) use of aspirin: Secondary | ICD-10-CM | POA: Diagnosis not present

## 2016-10-04 DIAGNOSIS — J441 Chronic obstructive pulmonary disease with (acute) exacerbation: Secondary | ICD-10-CM | POA: Diagnosis not present

## 2016-10-05 DIAGNOSIS — J440 Chronic obstructive pulmonary disease with acute lower respiratory infection: Secondary | ICD-10-CM | POA: Diagnosis not present

## 2016-10-05 DIAGNOSIS — Z7982 Long term (current) use of aspirin: Secondary | ICD-10-CM | POA: Diagnosis not present

## 2016-10-05 DIAGNOSIS — I10 Essential (primary) hypertension: Secondary | ICD-10-CM | POA: Diagnosis not present

## 2016-10-05 DIAGNOSIS — J441 Chronic obstructive pulmonary disease with (acute) exacerbation: Secondary | ICD-10-CM | POA: Diagnosis not present

## 2016-10-08 DIAGNOSIS — J441 Chronic obstructive pulmonary disease with (acute) exacerbation: Secondary | ICD-10-CM | POA: Diagnosis not present

## 2016-10-08 DIAGNOSIS — I10 Essential (primary) hypertension: Secondary | ICD-10-CM | POA: Diagnosis not present

## 2016-10-08 DIAGNOSIS — J440 Chronic obstructive pulmonary disease with acute lower respiratory infection: Secondary | ICD-10-CM | POA: Diagnosis not present

## 2016-10-08 DIAGNOSIS — Z7982 Long term (current) use of aspirin: Secondary | ICD-10-CM | POA: Diagnosis not present

## 2016-10-10 DIAGNOSIS — J441 Chronic obstructive pulmonary disease with (acute) exacerbation: Secondary | ICD-10-CM | POA: Diagnosis not present

## 2016-10-10 DIAGNOSIS — J440 Chronic obstructive pulmonary disease with acute lower respiratory infection: Secondary | ICD-10-CM | POA: Diagnosis not present

## 2016-10-10 DIAGNOSIS — Z7982 Long term (current) use of aspirin: Secondary | ICD-10-CM | POA: Diagnosis not present

## 2016-10-10 DIAGNOSIS — I10 Essential (primary) hypertension: Secondary | ICD-10-CM | POA: Diagnosis not present

## 2016-10-11 DIAGNOSIS — J441 Chronic obstructive pulmonary disease with (acute) exacerbation: Secondary | ICD-10-CM | POA: Diagnosis not present

## 2016-10-11 DIAGNOSIS — Z7982 Long term (current) use of aspirin: Secondary | ICD-10-CM | POA: Diagnosis not present

## 2016-10-11 DIAGNOSIS — J440 Chronic obstructive pulmonary disease with acute lower respiratory infection: Secondary | ICD-10-CM | POA: Diagnosis not present

## 2016-10-11 DIAGNOSIS — I10 Essential (primary) hypertension: Secondary | ICD-10-CM | POA: Diagnosis not present

## 2016-10-14 ENCOUNTER — Emergency Department: Payer: Medicare Other

## 2016-10-14 ENCOUNTER — Encounter: Payer: Self-pay | Admitting: Emergency Medicine

## 2016-10-14 ENCOUNTER — Emergency Department
Admission: EM | Admit: 2016-10-14 | Discharge: 2016-10-14 | Disposition: A | Discharge status: Home / Self Care | Payer: Medicare Other | Attending: Emergency Medicine | Admitting: Emergency Medicine

## 2016-10-14 DIAGNOSIS — Z7982 Long term (current) use of aspirin: Secondary | ICD-10-CM | POA: Insufficient documentation

## 2016-10-14 DIAGNOSIS — N3091 Cystitis, unspecified with hematuria: Secondary | ICD-10-CM | POA: Insufficient documentation

## 2016-10-14 DIAGNOSIS — R319 Hematuria, unspecified: Secondary | ICD-10-CM | POA: Diagnosis not present

## 2016-10-14 DIAGNOSIS — J441 Chronic obstructive pulmonary disease with (acute) exacerbation: Secondary | ICD-10-CM | POA: Diagnosis not present

## 2016-10-14 DIAGNOSIS — E039 Hypothyroidism, unspecified: Secondary | ICD-10-CM | POA: Diagnosis not present

## 2016-10-14 DIAGNOSIS — Z79899 Other long term (current) drug therapy: Secondary | ICD-10-CM | POA: Insufficient documentation

## 2016-10-14 DIAGNOSIS — K5730 Diverticulosis of large intestine without perforation or abscess without bleeding: Secondary | ICD-10-CM | POA: Diagnosis not present

## 2016-10-14 LAB — CBC
HCT: 42.8 % (ref 35.0–47.0)
Hemoglobin: 14.3 g/dL (ref 12.0–16.0)
MCH: 29.5 pg (ref 26.0–34.0)
MCHC: 33.5 g/dL (ref 32.0–36.0)
MCV: 87.9 fL (ref 80.0–100.0)
Platelets: 184 10*3/uL (ref 150–440)
RBC: 4.86 MIL/uL (ref 3.80–5.20)
RDW: 15.4 % — ABNORMAL HIGH (ref 11.5–14.5)
WBC: 11.7 10*3/uL — ABNORMAL HIGH (ref 3.6–11.0)

## 2016-10-14 LAB — URINALYSIS, COMPLETE (UACMP) WITH MICROSCOPIC
Bilirubin Urine: NEGATIVE
Glucose, UA: NEGATIVE mg/dL
Ketones, ur: 5 mg/dL — AB
Nitrite: NEGATIVE
Protein, ur: 100 mg/dL — AB
Specific Gravity, Urine: 1.02 (ref 1.005–1.030)
pH: 5 (ref 5.0–8.0)

## 2016-10-14 LAB — BASIC METABOLIC PANEL
Anion gap: 9 (ref 5–15)
BUN: 23 mg/dL — ABNORMAL HIGH (ref 6–20)
CO2: 26 mmol/L (ref 22–32)
Calcium: 9.1 mg/dL (ref 8.9–10.3)
Chloride: 100 mmol/L — ABNORMAL LOW (ref 101–111)
Creatinine, Ser: 1.02 mg/dL — ABNORMAL HIGH (ref 0.44–1.00)
GFR calc Af Amer: 57 mL/min — ABNORMAL LOW (ref >60)
GFR calc non Af Amer: 49 mL/min — ABNORMAL LOW (ref >60)
Glucose, Bld: 105 mg/dL — ABNORMAL HIGH (ref 65–99)
Potassium: 4.4 mmol/L (ref 3.5–5.1)
Sodium: 135 mmol/L (ref 135–145)

## 2016-10-14 MED ORDER — DEXTROSE 5 % IV SOLN: 1.0000 g | Freq: Once | INTRAVENOUS | Status: DC

## 2016-10-14 MED ORDER — CEFTRIAXONE SODIUM-DEXTROSE 1-3.74 GM-% IV SOLR
1.0000 g | Freq: Once | INTRAVENOUS | Status: AC
  Administered 2016-10-14: 1 g via INTRAVENOUS
  Filled 2016-10-14: qty 50

## 2016-10-14 MED ORDER — CEPHALEXIN 500 MG PO CAPS: 500.0000 mg | ORAL_CAPSULE | Freq: Three times a day (TID) | ORAL | 0 refills | Status: AC

## 2016-10-14 MED ORDER — SODIUM CHLORIDE 0.9 % IV BOLUS (SEPSIS)
500.0000 mL | Freq: Once | INTRAVENOUS | Status: AC
  Administered 2016-10-14: 500 mL via INTRAVENOUS

## 2016-10-14 NOTE — ED Provider Notes (Signed)
Time Seen: Approximately1600  I have reviewed the triage notes  Chief Complaint: Hematuria   History of Present Illness: Abigail Strong is a 83 y.o. female who states she woke up this morning with some mild nausea and states she "" did not feel "". She is not aware of any fever and denies any vomiting. She states she went to church and had some urinary frequency and noticed some blood in her urine. This continued when she went home and noticed some rust colored urine. She states it burns when she urinates. She denies any back flank or abdominal pain. She denies any loose stool or diarrhea. No vaginal discharge or bleeding.   Past Medical History:  Diagnosis Date  . Depression   . GERD (gastroesophageal reflux disease)   . Hyperlipidemia   . Hypothyroidism   . Insomnia     Patient Active Problem List   Diagnosis Date Noted  . COPD exacerbation ([place]) 09/27/2016  . Pneumonia 09/27/2016  . Influenza A 09/27/2016  . Prediabetes 09/27/2016  . Leukocytosis 09/27/2016  . Generalized weakness 09/27/2016  . Musculoskeletal chest pain 09/27/2016  . Acute respiratory failure with hypoxia ([place]) 09/22/2016  . Bronchitis 09/09/2016  . Diverticulitis 03/23/2015  . HYPERTENSION 12/08/2009  . COUGH 12/08/2009    Past Surgical History:  Procedure Laterality Date  . ABDOMINAL HYSTERECTOMY    . cataracts    . COLON SURGERY     partial colectomy  . intestinal blockage    . NISSEN FUNDOPLICATION      Past Surgical History:  Procedure Laterality Date  . ABDOMINAL HYSTERECTOMY    . cataracts    . COLON SURGERY     partial colectomy  . intestinal blockage    . NISSEN FUNDOPLICATION      Current Outpatient Rx  . Order #: [id number] Class: Historical Med  . Order #: [id number] Class: Normal  . Order #: [id number] Class: Normal  . Order #: [id number] Class: Historical Med  . Order #: [id number] Class: Historical Med  . Order #: [id number] Class: Normal  . Order #: [id number] Class:  Historical Med  . Order #: [id number] Class: Historical Med  . Order #: [id number] Class: Normal  . Order #: [id number] Class: Normal  . Order #: [id number] Class: Historical Med  . Order #: [id number] Class: Normal  . Order #: [id number] Class: Normal  . Order #: [id number] Class: Historical Med  . Order #: [id number] Class: Historical Med  . Order #: [id number] Class: Historical Med  . Order #: [id number] Class: Normal  . Order #: [id number] Class: Historical Med  . Order #: [id number] Class: Historical Med  . Order #: [id number] Class: Normal  . Order #: [id number] Class: Historical Med  . Order #: [id number] Class: Historical Med    Allergies:  Penicillins and Sulfonamide derivatives  Family History: Family History  Problem Relation Age of Onset  . Cervical cancer Mother     Social History: Social History  Substance Use Topics  . Smoking status: Never Smoker  . Smokeless tobacco: Never Used  . Alcohol use No     Review of Systems:   10 point review of systems was performed and was otherwise negative:  Constitutional: No fever Eyes: No visual disturbances ENT: No sore throat, ear pain Cardiac: No chest pain Respiratory: No shortness of breath, wheezing, or stridor Abdomen: No abdominal pain, no vomiting, No diarrhea Endocrine: No weight loss, No night sweats Extremities: No peripheral edema, cyanosis Skin: No rashes, easy bruising Neurologic: No focal weakness, trouble with speech or swollowing Urologic:Somewhat rust colored urine without any clots were present. I  didn't see a picture that was taken by the family and transmitted. Not a large amount of blood approximately a teaspoon   Physical Exam:  ED Triage Vitals  Enc Vitals Group     BP 10/14/16 1414 109/81     Pulse Rate 10/14/16 1414 96     Resp 10/14/16 1414 18     Temp 10/14/16 1414 98.3 F (36.8 C)     Temp Source 10/14/16 1414 Oral     SpO2 10/14/16 1414 96 %     Weight 10/14/16 1412 180 lb (81.6 kg)     Height  10/14/16 1412 5\' 5"  (1.651 m)     Head Circumference --      Peak Flow --      Pain Score 10/14/16 1529 0     Pain Loc --      Pain Edu? --      Excl. in [place]? --     General: Awake , Alert , and Oriented times 3; GCS 15 Head: Normal cephalic , atraumatic Eyes: Pupils equal , round, reactive to light Nose/Throat: No nasal drainage, patent upper airway without erythema or exudate.  Neck: Supple, Full range of motion, No anterior adenopathy or palpable thyroid masses Lungs: Clear to ascultation without wheezes , rhonchi, or rales Heart: Regular rate, regular rhythm without murmurs , gallops , or rubs Abdomen: Soft, non tender without rebound, guarding , or rigidity; bowel sounds positive and symmetric in all 4 quadrants. No organomegaly .        Extremities: 2 plus symmetric pulses. No edema, clubbing or cyanosis Neurologic: normal ambulation, Motor symmetric without deficits, sensory intact Skin: warm, dry, no rashes   Labs:   All laboratory work was reviewed including any pertinent negatives or positives listed below:  Labs Reviewed  URINALYSIS, COMPLETE (UACMP) WITH MICROSCOPIC - Abnormal; Notable for the following:       Result Value   Color, Urine AMBER (*)    APPearance CLOUDY (*)    Hgb urine dipstick LARGE (*)    Ketones, ur 5 (*)    Protein, ur 100 (*)    Leukocytes, UA LARGE (*)    Bacteria, UA RARE (*)    Squamous Epithelial / LPF 0-5 (*)    All other components within normal limits  CBC - Abnormal; Notable for the following:    WBC 11.7 (*)    RDW 15.4 (*)    All other components within normal limits  BASIC METABOLIC PANEL - Abnormal; Notable for the following:    Chloride 100 (*)    Glucose, Bld 105 (*)    BUN 23 (*)    Creatinine, Ser 1.02 (*)    GFR calc non Af Amer 49 (*)    GFR calc Af Amer 57 (*)    All other components within normal limits  URINE CULTURE  Patient has a large amount of blood and leukocytes in her urine. Urine culture was added.  Otherwise renal function is within normal limits for an 83 year old female  Radiology: * "Ct Head Wo Contrast  Result Date: 09/22/2016 CLINICAL DATA:  Speech abnormality.  Weakness and lethargy. EXAM: CT HEAD WITHOUT CONTRAST TECHNIQUE: Contiguous axial images were obtained from the base of the skull through the vertex without intravenous contrast. COMPARISON:  Head CT 07/25/2016 FINDINGS: Brain: No evidence of acute infarction, hemorrhage, hydrocephalus, extra-axial collection or mass lesion/mass effect. Stable mild for age atrophy. Remote lacunar infarcts in the basal ganglia again seen. Vascular: Atherosclerosis of skullbase  vasculature without hyperdense vessel or abnormal calcification. Skull: Normal. Negative for fracture or focal lesion. Sinuses/Orbits: There fluid levels in the sphenoid and right maxillary sinuses, new from prior exam. Opacification of right ethmoid air cells. Bilateral cataract resection. Other: None. IMPRESSION: 1. No evidence of acute intracranial abnormality. Stable mild atrophy and remote lacunar infarcts. 2. Paranasal sinus disease, suspect this is acute sinusitis. Electronically Signed   By: Jeb Levering M.D.   On: 09/22/2016 21:49   Ct Angio Chest Pe W Or Wo Contrast  Result Date: 09/24/2016 CLINICAL DATA:  83 year old presenting with acute onset of left-sided chest pain earlier today. Current history of influenza with worsening cough. Patient also has hypoxemia. EXAM: CT ANGIOGRAPHY CHEST WITH CONTRAST TECHNIQUE: Multidetector CT imaging of the chest was performed using the standard protocol during bolus administration of intravenous contrast. Multiplanar CT image reconstructions and MIPs were obtained to evaluate the vascular anatomy. CONTRAST:  75 mL Isovue 370 IV. COMPARISON:  CT chest 09/11/2016. FINDINGS: Respiratory motion blurred many of the images. The study is of less than average technical quality. Cardiovascular: Contrast opacification of pulmonary arteries is  good. However, the respiratory motion makes evaluation of subsegmental arteries difficult. No filling defects within the main pulmonary arteries or their central branches in either lung to suggest pulmonary embolism. Cardiac silhouette mildly enlarged with biatrial enlargement. Reflux of contrast into the IVC and hepatic veins. No pericardial effusion. Moderate LAD coronary atherosclerosis. Mild to moderate atherosclerosis involving the thoracic and upper abdominal aorta without evidence of aneurysm or dissection. Mediastinum/Nodes: No pathologic lymphadenopathy. Moderate-sized hiatal hernia. Normal appearing esophagus. Thyroid gland atrophic. Lungs/Pleura: Patchy airspace opacities peripherally in the right upper lobe and right lower lobe. Scar and bronchiectasis involving the medial lower lobes bilaterally. No confluent airspace consolidation. No pulmonary parenchymal nodules or masses. No pleural effusions. Upper Abdomen: Hiatal hernia as noted above. Upper abdomen otherwise normal in appearance for the early portal venous phase of enhancement. Musculoskeletal: DISH involving the mid thoracic spine. Osseous demineralization. Degenerative disc disease and spondylosis throughout the thoracic spine and upper lumbar spine. Exaggeration of the usual thoracic kyphosis. No acute abnormalities. Review of the MIP images confirms the above findings. IMPRESSION: 1. No evidence central pulmonary embolism. 2. Patchy pneumonia involving the right upper lobe and right lower lobe. 3. Mild cardiomegaly with biatrial enlargement. Reflux of contrast into the IVC and hepatic veins likely indicates right heart disease. 4. Moderate-sized hiatal hernia. 5. Mild to moderate atherosclerosis involving the thoracic and upper abdominal aorta without evidence of aneurysm. Electronically Signed   By: Evangeline Dakin M.D.   On: 09/24/2016 17:17   Dg Chest Port 1 View  Result Date: 09/22/2016 CLINICAL DATA:  Cough for 1 month. EXAM:  PORTABLE CHEST 1 VIEW COMPARISON:  09/08/2016 FINDINGS: Stable elevation of the right hemidiaphragm. The lungs are clear. Pulmonary vasculature is normal. No pleural effusions. Hilar and mediastinal contours are unremarkable and unchanged IMPRESSION: No acute cardiopulmonary findings. Electronically Signed   By: Andreas Newport M.D.   On: 09/22/2016 19:27   Ct Renal Stone Study  Result Date: 10/14/2016 CLINICAL DATA:  Hematuria with painful urination. EXAM: CT ABDOMEN AND PELVIS WITHOUT CONTRAST TECHNIQUE: Multidetector CT imaging of the abdomen and pelvis was performed following the standard protocol without IV contrast. COMPARISON:  04/11/2015 FINDINGS: Lower chest: Minimal peripheral reticulonodular density unchanged. Stable moderate size hiatal hernia. Hepatobiliary: Few small calcified liver granulomas. Gallbladder and biliary tree are within normal. Pancreas: Within normal. Spleen: Within normal. Adrenals/Urinary Tract: Adrenal glands are  normal. Kidneys are normal in size without hydronephrosis or nephrolithiasis. There is a 2.8 cm cyst over the mid pole of the left kidney. Ureters are within normal. Bladder is decompressed and otherwise within normal. Stomach/Bowel: Small bowel is within normal. Appendix is normal. There is moderate diverticulosis of the colon most prominent over the sigmoid colon. Cecum is within the pelvis. Vascular/Lymphatic: Mild calcified plaque over the abdominal aorta and iliac arteries. Remaining vascular structures are unremarkable. No adenopathy. Reproductive: Previous hysterectomy.  Adnexal regions unremarkable. Other: No free fluid or focal inflammatory change. Small umbilical hernia containing only peritoneal fat. Musculoskeletal: Degenerative change of the spine. Subtle grade 1 anterolisthesis of L4 on L5 unchanged. IMPRESSION: No evidence of nephroureterolithiasis or obstruction. 2.8 cm left renal cyst. Colonic diverticulosis without active inflammation. Stable moderate  size hiatal hernia. Aortic atherosclerosis. Electronically Signed   By: Marin Olp M.D.   On: 10/14/2016 16:52  "  I personally reviewed the radiologic studies     ED Course: Patient received IV Rocephin and tolerated here well in the emergency department. Her CAT scan does not show any masses, tumors, or evidence of renal colic. Her hematuria is most likely hemorrhagic cystitis. Discharged on oral antibiotics and states she feels symptomatically improved. She is afebrile without any nausea or vomiting.     Assessment: * Hemorrhagic cystitis Final Clinical Impression:   Final diagnoses:  Hematuria     Plan:  Outpatient " New Prescriptions   CEPHALEXIN (KEFLEX) 500 MG CAPSULE    Take 1 capsule (500 mg total) by mouth 3 (three) times daily.  " Urine culture pending Patient was advised to return immediately if condition worsens. Patient was advised to follow up with their primary care physician or other specialized physicians involved in their outpatient care. The patient and/or family member/power of attorney had laboratory results reviewed at the bedside. All questions and concerns were addressed and appropriate discharge instructions were distributed by the nursing staff.           Daymon Larsen, MD 10/14/16 [phone]

## 2016-10-14 NOTE — ED Notes (Signed)
Daughter phone number [phone]

## 2016-10-14 NOTE — Discharge Instructions (Signed)
Your CAT scan of the abdomen did not show any masses or tumors as a source for the bleeding. I felt this was most likely from a urinary tract infection. Please contact her primary physician for further outpatient follow-up. Urine culture is pending to make sure we've been prescribed the correct antibiotic. If you are contacted that may mean that there needs to be a change in the antibiotic. No news is good news

## 2016-10-14 NOTE — ED Notes (Signed)
Daughter called to come pick up patient, states that she is on the way.

## 2016-10-14 NOTE — ED Triage Notes (Signed)
Pt c/o dysuria and hematuria that started today. No pain when not urinating. Has pressure in bladder and feels like always has to use bathroom.  NAD.  No fevers.

## 2016-10-14 NOTE — ED Notes (Addendum)
Painful urination and bright red blood since this morning.  Pt reports nausea before church. Denies any back pain. No hx of kidney stone. Pt states blood is present when she wipes.  Pt reports back pain yesterday but states she isn't having any pain today.

## 2016-10-15 DIAGNOSIS — J209 Acute bronchitis, unspecified: Secondary | ICD-10-CM | POA: Diagnosis not present

## 2016-10-15 DIAGNOSIS — F5102 Adjustment insomnia: Secondary | ICD-10-CM | POA: Diagnosis not present

## 2016-10-15 DIAGNOSIS — J441 Chronic obstructive pulmonary disease with (acute) exacerbation: Secondary | ICD-10-CM | POA: Diagnosis not present

## 2016-10-15 DIAGNOSIS — J40 Bronchitis, not specified as acute or chronic: Secondary | ICD-10-CM | POA: Diagnosis not present

## 2016-10-15 DIAGNOSIS — I10 Essential (primary) hypertension: Secondary | ICD-10-CM | POA: Diagnosis not present

## 2016-10-15 DIAGNOSIS — J440 Chronic obstructive pulmonary disease with acute lower respiratory infection: Secondary | ICD-10-CM | POA: Diagnosis not present

## 2016-10-15 DIAGNOSIS — R319 Hematuria, unspecified: Secondary | ICD-10-CM | POA: Diagnosis not present

## 2016-10-15 DIAGNOSIS — Z7982 Long term (current) use of aspirin: Secondary | ICD-10-CM | POA: Diagnosis not present

## 2016-10-17 DIAGNOSIS — Z7982 Long term (current) use of aspirin: Secondary | ICD-10-CM | POA: Diagnosis not present

## 2016-10-17 DIAGNOSIS — J440 Chronic obstructive pulmonary disease with acute lower respiratory infection: Secondary | ICD-10-CM | POA: Diagnosis not present

## 2016-10-17 DIAGNOSIS — I10 Essential (primary) hypertension: Secondary | ICD-10-CM | POA: Diagnosis not present

## 2016-10-17 DIAGNOSIS — J441 Chronic obstructive pulmonary disease with (acute) exacerbation: Secondary | ICD-10-CM | POA: Diagnosis not present

## 2016-10-17 LAB — URINE CULTURE: Culture: >=100000 — AB

## 2016-10-19 DIAGNOSIS — J441 Chronic obstructive pulmonary disease with (acute) exacerbation: Secondary | ICD-10-CM | POA: Diagnosis not present

## 2016-10-19 DIAGNOSIS — I10 Essential (primary) hypertension: Secondary | ICD-10-CM | POA: Diagnosis not present

## 2016-10-19 DIAGNOSIS — Z7982 Long term (current) use of aspirin: Secondary | ICD-10-CM | POA: Diagnosis not present

## 2016-10-19 DIAGNOSIS — J440 Chronic obstructive pulmonary disease with acute lower respiratory infection: Secondary | ICD-10-CM | POA: Diagnosis not present

## 2016-10-24 DIAGNOSIS — J441 Chronic obstructive pulmonary disease with (acute) exacerbation: Secondary | ICD-10-CM | POA: Diagnosis not present

## 2016-10-24 DIAGNOSIS — Z7982 Long term (current) use of aspirin: Secondary | ICD-10-CM | POA: Diagnosis not present

## 2016-10-24 DIAGNOSIS — I10 Essential (primary) hypertension: Secondary | ICD-10-CM | POA: Diagnosis not present

## 2016-10-24 DIAGNOSIS — J440 Chronic obstructive pulmonary disease with acute lower respiratory infection: Secondary | ICD-10-CM | POA: Diagnosis not present

## 2016-11-01 DIAGNOSIS — F5102 Adjustment insomnia: Secondary | ICD-10-CM | POA: Diagnosis not present

## 2016-11-01 DIAGNOSIS — J440 Chronic obstructive pulmonary disease with acute lower respiratory infection: Secondary | ICD-10-CM | POA: Diagnosis not present

## 2016-11-01 DIAGNOSIS — G450 Vertebro-basilar artery syndrome: Secondary | ICD-10-CM | POA: Diagnosis not present

## 2016-11-01 DIAGNOSIS — J441 Chronic obstructive pulmonary disease with (acute) exacerbation: Secondary | ICD-10-CM | POA: Diagnosis not present

## 2016-11-01 DIAGNOSIS — I10 Essential (primary) hypertension: Secondary | ICD-10-CM | POA: Diagnosis not present

## 2016-11-01 DIAGNOSIS — J209 Acute bronchitis, unspecified: Secondary | ICD-10-CM | POA: Diagnosis not present

## 2016-11-01 DIAGNOSIS — Z7982 Long term (current) use of aspirin: Secondary | ICD-10-CM | POA: Diagnosis not present

## 2016-11-12 DIAGNOSIS — J40 Bronchitis, not specified as acute or chronic: Secondary | ICD-10-CM | POA: Diagnosis not present

## 2016-11-21 ENCOUNTER — Ambulatory Visit
Admission: RE | Admit: 2016-11-21 | Discharge: 2016-11-21 | Disposition: A | Discharge status: Home / Self Care | Payer: Medicare Other | Source: Ambulatory Visit | Attending: Internal Medicine | Admitting: Internal Medicine

## 2016-11-21 ENCOUNTER — Ambulatory Visit
Admission: RE | Admit: 2016-11-21 | Discharge: 2016-11-21 | Disposition: A | Discharge status: Home / Self Care | Payer: Medicare Other | Source: Ambulatory Visit | Attending: Cardiology | Admitting: Cardiology

## 2016-11-21 ENCOUNTER — Other Ambulatory Visit: Payer: Self-pay | Admitting: Internal Medicine

## 2016-11-21 DIAGNOSIS — J209 Acute bronchitis, unspecified: Secondary | ICD-10-CM | POA: Diagnosis not present

## 2016-11-21 DIAGNOSIS — M25552 Pain in left hip: Secondary | ICD-10-CM

## 2016-11-21 DIAGNOSIS — M76892 Other specified enthesopathies of left lower limb, excluding foot: Secondary | ICD-10-CM | POA: Diagnosis not present

## 2016-11-21 DIAGNOSIS — S79912A Unspecified injury of left hip, initial encounter: Secondary | ICD-10-CM | POA: Diagnosis not present

## 2016-11-21 DIAGNOSIS — F5102 Adjustment insomnia: Secondary | ICD-10-CM | POA: Diagnosis not present

## 2016-11-28 DIAGNOSIS — G451 Carotid artery syndrome (hemispheric): Secondary | ICD-10-CM | POA: Diagnosis not present

## 2016-11-28 DIAGNOSIS — Z87448 Personal history of other diseases of urinary system: Secondary | ICD-10-CM | POA: Diagnosis not present

## 2016-11-28 DIAGNOSIS — R0602 Shortness of breath: Secondary | ICD-10-CM | POA: Diagnosis not present

## 2016-11-28 DIAGNOSIS — R609 Edema, unspecified: Secondary | ICD-10-CM | POA: Diagnosis not present

## 2016-12-13 DIAGNOSIS — J40 Bronchitis, not specified as acute or chronic: Secondary | ICD-10-CM | POA: Diagnosis not present

## 2016-12-13 DIAGNOSIS — J209 Acute bronchitis, unspecified: Secondary | ICD-10-CM | POA: Diagnosis not present

## 2016-12-13 DIAGNOSIS — M76892 Other specified enthesopathies of left lower limb, excluding foot: Secondary | ICD-10-CM | POA: Diagnosis not present

## 2016-12-13 DIAGNOSIS — R911 Solitary pulmonary nodule: Secondary | ICD-10-CM | POA: Diagnosis not present

## 2016-12-19 DIAGNOSIS — M76892 Other specified enthesopathies of left lower limb, excluding foot: Secondary | ICD-10-CM | POA: Diagnosis not present

## 2016-12-19 DIAGNOSIS — J209 Acute bronchitis, unspecified: Secondary | ICD-10-CM | POA: Diagnosis not present

## 2016-12-19 DIAGNOSIS — R911 Solitary pulmonary nodule: Secondary | ICD-10-CM | POA: Diagnosis not present

## 2016-12-31 DIAGNOSIS — E784 Other hyperlipidemia: Secondary | ICD-10-CM | POA: Diagnosis not present

## 2016-12-31 DIAGNOSIS — Z0000 Encounter for general adult medical examination without abnormal findings: Secondary | ICD-10-CM | POA: Diagnosis not present

## 2017-01-12 DIAGNOSIS — J40 Bronchitis, not specified as acute or chronic: Secondary | ICD-10-CM | POA: Diagnosis not present

## 2017-01-25 ENCOUNTER — Ambulatory Visit: Payer: Self-pay | Admitting: Podiatry

## 2017-02-12 DIAGNOSIS — J40 Bronchitis, not specified as acute or chronic: Secondary | ICD-10-CM | POA: Diagnosis not present

## 2017-02-19 ENCOUNTER — Ambulatory Visit (INDEPENDENT_AMBULATORY_CARE_PROVIDER_SITE_OTHER): Payer: Medicare Other | Admitting: Podiatry

## 2017-02-19 DIAGNOSIS — M79676 Pain in unspecified toe(s): Secondary | ICD-10-CM | POA: Diagnosis not present

## 2017-02-19 DIAGNOSIS — L600 Ingrowing nail: Secondary | ICD-10-CM

## 2017-02-19 DIAGNOSIS — B351 Tinea unguium: Secondary | ICD-10-CM | POA: Diagnosis not present

## 2017-02-19 DIAGNOSIS — R600 Localized edema: Secondary | ICD-10-CM

## 2017-02-19 DIAGNOSIS — L603 Nail dystrophy: Secondary | ICD-10-CM

## 2017-02-19 DIAGNOSIS — M1710 Unilateral primary osteoarthritis, unspecified knee: Secondary | ICD-10-CM | POA: Insufficient documentation

## 2017-02-22 NOTE — Progress Notes (Signed)
   HPI: Patient is an 83 year old female presenting today for cramping in the left calf that has been ongoing for the last 2-3 months. She reports associated swelling of the left foot and reports it feels tight but denies any pain in the foot. Pt was referred here by her PCP. She is here for further evaluation and treatment.     Physical Exam: General: The patient is alert and oriented x3 in no acute distress.  Dermatology: Skin is warm, dry and supple bilateral lower extremities. Negative for open lesions or macerations. There is a slight ingrown toenail to the right great toe with the nail border protruding into the lateral nail fold. There is no sign of infectious process or cellulitis around the right great toe.  Vascular: Palpable pedal pulses bilaterally. No edema or erythema noted. Capillary refill within normal limits.  Neurological: Epicritic and protective threshold grossly intact bilaterally.   Musculoskeletal Exam: Range of motion within normal limits to all pedal and ankle joints bilateral. Muscle strength 5/5 in all groups bilateral. Slight pain on palpation noted to the right great toenail at the offending border of the lateral nail fold  Assessment: 1. Bilateral lower extremity edema 2. Ingrown right great toenail without infection   Plan of Care:  1. Patient was evaluated. 2. Recommended knee high compression socks to address bilateral lower extremity edema 3. Mechanical debridement of right great toenail performed using a nail nipper. Subungual debris was removed from the lateral nail fold to alleviate the pain. Patient immediately felt much better.  4. Return to clinic when necessary.    Edrick Kins, DPM [facility]  Dr. Edrick Kins, [address]                                        [place], [place] [id number]                Office [phone]  Fax [phone]

## 2017-02-28 DIAGNOSIS — R350 Frequency of micturition: Secondary | ICD-10-CM | POA: Diagnosis not present

## 2017-02-28 DIAGNOSIS — F5102 Adjustment insomnia: Secondary | ICD-10-CM | POA: Diagnosis not present

## 2017-03-01 ENCOUNTER — Other Ambulatory Visit: Payer: Self-pay | Admitting: Internal Medicine

## 2017-03-01 DIAGNOSIS — R911 Solitary pulmonary nodule: Secondary | ICD-10-CM

## 2017-03-01 DIAGNOSIS — R600 Localized edema: Secondary | ICD-10-CM

## 2017-03-05 ENCOUNTER — Ambulatory Visit
Admission: RE | Admit: 2017-03-05 | Discharge: 2017-03-05 | Disposition: A | Discharge status: Home / Self Care | Payer: Medicare Other | Source: Ambulatory Visit | Attending: Internal Medicine | Admitting: Internal Medicine

## 2017-03-05 DIAGNOSIS — R911 Solitary pulmonary nodule: Secondary | ICD-10-CM | POA: Diagnosis not present

## 2017-03-05 DIAGNOSIS — N644 Mastodynia: Secondary | ICD-10-CM | POA: Insufficient documentation

## 2017-03-05 DIAGNOSIS — I700 Atherosclerosis of aorta: Secondary | ICD-10-CM | POA: Insufficient documentation

## 2017-03-05 DIAGNOSIS — R918 Other nonspecific abnormal finding of lung field: Secondary | ICD-10-CM | POA: Diagnosis not present

## 2017-03-05 DIAGNOSIS — I2510 Atherosclerotic heart disease of native coronary artery without angina pectoris: Secondary | ICD-10-CM | POA: Insufficient documentation

## 2017-03-05 DIAGNOSIS — M7121 Synovial cyst of popliteal space [Baker], right knee: Secondary | ICD-10-CM | POA: Diagnosis not present

## 2017-03-05 DIAGNOSIS — R600 Localized edema: Secondary | ICD-10-CM | POA: Diagnosis not present

## 2017-03-05 DIAGNOSIS — M7989 Other specified soft tissue disorders: Secondary | ICD-10-CM | POA: Diagnosis present

## 2017-03-07 DIAGNOSIS — G450 Vertebro-basilar artery syndrome: Secondary | ICD-10-CM | POA: Diagnosis not present

## 2017-03-07 DIAGNOSIS — F5102 Adjustment insomnia: Secondary | ICD-10-CM | POA: Diagnosis not present

## 2017-03-07 DIAGNOSIS — R350 Frequency of micturition: Secondary | ICD-10-CM | POA: Diagnosis not present

## 2017-03-08 ENCOUNTER — Other Ambulatory Visit: Payer: Self-pay | Admitting: Internal Medicine

## 2017-03-08 DIAGNOSIS — R32 Unspecified urinary incontinence: Secondary | ICD-10-CM

## 2017-03-08 DIAGNOSIS — R109 Unspecified abdominal pain: Secondary | ICD-10-CM

## 2017-03-13 ENCOUNTER — Ambulatory Visit: Admission: RE | Admit: 2017-03-13 | Payer: Medicare Other | Source: Ambulatory Visit

## 2017-03-13 ENCOUNTER — Ambulatory Visit: Payer: Medicare Other

## 2017-03-14 DIAGNOSIS — J40 Bronchitis, not specified as acute or chronic: Secondary | ICD-10-CM | POA: Diagnosis not present

## 2017-03-21 ENCOUNTER — Ambulatory Visit
Admission: RE | Admit: 2017-03-21 | Discharge: 2017-03-21 | Disposition: A | Discharge status: Home / Self Care | Payer: Medicare Other | Source: Ambulatory Visit | Attending: Internal Medicine | Admitting: Internal Medicine

## 2017-03-21 DIAGNOSIS — R32 Unspecified urinary incontinence: Secondary | ICD-10-CM

## 2017-03-21 DIAGNOSIS — K760 Fatty (change of) liver, not elsewhere classified: Secondary | ICD-10-CM | POA: Diagnosis not present

## 2017-03-21 DIAGNOSIS — R109 Unspecified abdominal pain: Secondary | ICD-10-CM

## 2017-03-21 DIAGNOSIS — R938 Abnormal findings on diagnostic imaging of other specified body structures: Secondary | ICD-10-CM | POA: Insufficient documentation

## 2017-03-21 DIAGNOSIS — R932 Abnormal findings on diagnostic imaging of liver and biliary tract: Secondary | ICD-10-CM | POA: Insufficient documentation

## 2017-03-25 DIAGNOSIS — G450 Vertebro-basilar artery syndrome: Secondary | ICD-10-CM | POA: Diagnosis not present

## 2017-03-25 DIAGNOSIS — F5102 Adjustment insomnia: Secondary | ICD-10-CM | POA: Diagnosis not present

## 2017-03-25 DIAGNOSIS — R911 Solitary pulmonary nodule: Secondary | ICD-10-CM | POA: Diagnosis not present

## 2017-04-03 DIAGNOSIS — H01003 Unspecified blepharitis right eye, unspecified eyelid: Secondary | ICD-10-CM | POA: Diagnosis not present

## 2017-04-14 DIAGNOSIS — J40 Bronchitis, not specified as acute or chronic: Secondary | ICD-10-CM | POA: Diagnosis not present

## 2017-04-23 DIAGNOSIS — G4733 Obstructive sleep apnea (adult) (pediatric): Secondary | ICD-10-CM | POA: Diagnosis not present

## 2017-05-15 DIAGNOSIS — J40 Bronchitis, not specified as acute or chronic: Secondary | ICD-10-CM | POA: Diagnosis not present

## 2017-05-22 DIAGNOSIS — G4733 Obstructive sleep apnea (adult) (pediatric): Secondary | ICD-10-CM | POA: Diagnosis not present

## 2017-05-27 DIAGNOSIS — R0602 Shortness of breath: Secondary | ICD-10-CM | POA: Diagnosis not present

## 2017-05-27 DIAGNOSIS — G450 Vertebro-basilar artery syndrome: Secondary | ICD-10-CM | POA: Diagnosis not present

## 2017-05-28 DIAGNOSIS — H35373 Puckering of macula, bilateral: Secondary | ICD-10-CM | POA: Diagnosis not present

## 2017-06-14 DIAGNOSIS — J40 Bronchitis, not specified as acute or chronic: Secondary | ICD-10-CM | POA: Diagnosis not present

## 2017-07-09 DIAGNOSIS — M1712 Unilateral primary osteoarthritis, left knee: Secondary | ICD-10-CM | POA: Diagnosis not present

## 2017-07-09 DIAGNOSIS — R0602 Shortness of breath: Secondary | ICD-10-CM | POA: Diagnosis not present

## 2017-07-09 DIAGNOSIS — M25461 Effusion, right knee: Secondary | ICD-10-CM | POA: Diagnosis not present

## 2017-07-09 DIAGNOSIS — G450 Vertebro-basilar artery syndrome: Secondary | ICD-10-CM | POA: Diagnosis not present

## 2017-07-25 DIAGNOSIS — M549 Dorsalgia, unspecified: Secondary | ICD-10-CM | POA: Diagnosis not present

## 2017-07-25 DIAGNOSIS — Z87448 Personal history of other diseases of urinary system: Secondary | ICD-10-CM | POA: Diagnosis not present

## 2017-07-25 DIAGNOSIS — F5102 Adjustment insomnia: Secondary | ICD-10-CM | POA: Diagnosis not present

## 2017-07-25 DIAGNOSIS — G450 Vertebro-basilar artery syndrome: Secondary | ICD-10-CM | POA: Diagnosis not present

## 2017-08-05 DIAGNOSIS — H1032 Unspecified acute conjunctivitis, left eye: Secondary | ICD-10-CM | POA: Diagnosis not present

## 2017-09-09 DIAGNOSIS — G450 Vertebro-basilar artery syndrome: Secondary | ICD-10-CM | POA: Diagnosis not present

## 2017-09-09 DIAGNOSIS — R0602 Shortness of breath: Secondary | ICD-10-CM | POA: Diagnosis not present

## 2017-09-09 DIAGNOSIS — F5102 Adjustment insomnia: Secondary | ICD-10-CM | POA: Diagnosis not present

## 2017-10-14 DIAGNOSIS — R0602 Shortness of breath: Secondary | ICD-10-CM | POA: Diagnosis not present

## 2017-10-14 DIAGNOSIS — G450 Vertebro-basilar artery syndrome: Secondary | ICD-10-CM | POA: Diagnosis not present

## 2017-10-14 DIAGNOSIS — Z87448 Personal history of other diseases of urinary system: Secondary | ICD-10-CM | POA: Diagnosis not present

## 2017-10-24 DIAGNOSIS — R61 Generalized hyperhidrosis: Secondary | ICD-10-CM | POA: Diagnosis not present

## 2017-10-24 DIAGNOSIS — Z87448 Personal history of other diseases of urinary system: Secondary | ICD-10-CM | POA: Diagnosis not present

## 2017-10-24 DIAGNOSIS — J209 Acute bronchitis, unspecified: Secondary | ICD-10-CM | POA: Diagnosis not present

## 2017-10-24 DIAGNOSIS — M549 Dorsalgia, unspecified: Secondary | ICD-10-CM | POA: Diagnosis not present

## 2017-10-29 ENCOUNTER — Encounter: Payer: Self-pay | Admitting: Urology

## 2017-10-29 ENCOUNTER — Ambulatory Visit: Payer: Medicare Other | Admitting: Urology

## 2017-10-29 VITALS — BP 146/88 | HR 60 | Resp 16 | Ht 65.0 in | Wt 180.0 lb

## 2017-10-29 DIAGNOSIS — N3946 Mixed incontinence: Secondary | ICD-10-CM

## 2017-10-29 DIAGNOSIS — N952 Postmenopausal atrophic vaginitis: Secondary | ICD-10-CM

## 2017-10-29 DIAGNOSIS — N8111 Cystocele, midline: Secondary | ICD-10-CM

## 2017-10-29 LAB — BLADDER SCAN AMB NON-IMAGING

## 2017-10-29 NOTE — Progress Notes (Signed)
10/29/2017 3:22 PM   Abigail Strong Apr 18, 1930 [id number]  Referring provider: Corky Downs, MD [address] [place], [place] [address]  No chief complaint on file.   HPI: Patient is a 84 -year-old Caucasian female who is referred to [place] by Dr. Corky Downs for urinary incontinence.  Patient states that she has had urinary incontinence for several months.  Patient has incontinence with stress and urge.   She is wearing two pads during the day and one pad at night.     She is having associated urinary frequency 9 she goes so many times during the day that she just ends up sitting on the toilet for extended periods of time), a strong urgency, nocturia (every two hours at night, she does use her CPAP machine) and weak urinary stream.   Patient denies any gross hematuria, dysuria or suprapubic/flank pain.  Patient denies any fevers, chills, nausea or vomiting.  Her PVR is 17 mL.    She does not have a history of urinary tract infections, STI's or injury to the bladder.  She does not have a history of nephrolithiasis, GU surgery or GU trauma.   She is not sexually active.  She is post menopausal.   She admits to constipation.  She is not having pain with bladder filling.    CT Renal stone study performed on 10/14/2016 noted the adrenal glands are normal. Kidneys are normal in size without hydronephrosis or nephrolithiasis. There is a 2.8 cm cyst over the mid pole of the left kidney. Ureters are within normal. Bladder is decompressed and otherwise within normal.  She is drinking 2 quarts of water daily.   She is drinking one cup of coffee in the morning.  She does not drink sodas, teas or alcohol.     PMH: Past Medical History:  Diagnosis Date  . Depression   . GERD (gastroesophageal reflux disease)   . Hyperlipidemia   . Hypothyroidism   . Insomnia     Surgical History: Past Surgical History:  Procedure Laterality Date  . ABDOMINAL HYSTERECTOMY    . cataracts    . COLON  SURGERY     partial colectomy  . intestinal blockage    . NISSEN FUNDOPLICATION      Home Medications:  Allergies as of 10/29/2017      Reactions   Sulfa Antibiotics Other (See Comments)   Sulfonamide Derivatives       Medication List        Accurate as of 10/29/17  3:22 PM. Always use your most recent med list.          acetaminophen 500 MG tablet Commonly known as:  TYLENOL Take 1,000 mg by mouth every 6 (six) hours as needed.   albuterol (2.5 MG/3ML) 0.083% nebulizer solution Commonly known as:  PROVENTIL Take 3 mLs (2.5 mg total) by nebulization every 4 (four) hours as needed for wheezing or shortness of breath.   albuterol (2.5 MG/3ML) 0.083% nebulizer solution Commonly known as:  PROVENTIL Take 3 mLs (2.5 mg total) by nebulization every 6 (six) hours as needed for wheezing or shortness of breath.   aspirin EC 81 MG tablet Take 81 mg by mouth daily.   BOOSTRIX 5-2.5-18.5 LF-MCG/0.5 injection Generic drug:  Tdap Boostrix Tdap 2.5 Lf unit-8 mcg-5 Lf/0.5 mL intramuscular suspension   CENTRUM SILVER ADULT 50+ Tabs Take 1 tablet by mouth daily.   chlorpheniramine-HYDROcodone 10-8 MG/5ML Suer Commonly known as:  TUSSIONEX Take 5 mLs by mouth 2 (two)  times daily.   citalopram 20 MG tablet Commonly known as:  CELEXA Take 20 mg by mouth daily.   clonazePAM 0.5 MG tablet Commonly known as:  KLONOPIN clonazepam 0.5 mg tablet   clopidogrel 75 MG tablet Commonly known as:  PLAVIX   clotrimazole-betamethasone cream Commonly known as:  LOTRISONE clotrimazole-betamethasone 1 %-0.05 % topical cream   DUREZOL 0.05 % Emul Generic drug:  Difluprednate Durezol 0.05 % eye drops   escitalopram 10 MG tablet Commonly known as:  LEXAPRO escitalopram 10 mg tablet   feeding supplement (ENSURE ENLIVE) Liqd Take 237 mLs by mouth 2 (two) times daily between meals.   Fluticasone-Salmeterol 250-50 MCG/DOSE Aepb Commonly known as:  ADVAIR DISKUS Inhale 1 puff into the  lungs 2 (two) times daily.   furosemide 20 MG tablet Commonly known as:  LASIX   gabapentin 100 MG capsule Commonly known as:  NEURONTIN gabapentin 100 mg capsule   Ginkgo Biloba 120 MG Caps Take 120 mg by mouth daily.   ILEVRO 0.3 % ophthalmic suspension Generic drug:  nepafenac Ilevro 0.3 % eye drops,suspension   levothyroxine 125 MCG tablet Commonly known as:  SYNTHROID, LEVOTHROID Take 125 mcg by mouth daily before breakfast.   meclizine 12.5 MG tablet Commonly known as:  ANTIVERT Take 12.5 mg by mouth 2 (two) times daily.   meloxicam 7.5 MG tablet Commonly known as:  MOBIC   nystatin powder Commonly known as:  MYCOSTATIN/NYSTOP Apply topically 2 (two) times daily.   omeprazole 40 MG capsule Commonly known as:  PRILOSEC Take 40 mg by mouth 2 (two) times daily.   oxybutynin 5 MG tablet Commonly known as:  DITROPAN oxybutynin chloride 5 mg tablet   pravastatin 40 MG tablet Commonly known as:  PRAVACHOL Take 40 mg by mouth at bedtime.   spironolactone 50 MG tablet Commonly known as:  ALDACTONE spironolactone 50 mg tablet   SYNVISC ONE 48 MG/6ML Sosy Generic drug:  Hylan Synvisc-One 48 mg/6 mL intra-articular syringe  BILATERAL   traZODone 50 MG tablet Commonly known as:  DESYREL Take 50 mg by mouth at bedtime.   vitamin C 1000 MG tablet Take 1,000 mg by mouth daily.   Vitamin D3 5000 units Caps Take 5,000 Units by mouth daily.   VOLTAREN 1 % Gel Generic drug:  diclofenac sodium Voltaren 1 % topical gel  APPLY 4 mg TO THE AFFECTED AREA(S) BY TOPICAL ROUTE 4 TIMES PER DAY.   zolpidem 10 MG tablet Commonly known as:  AMBIEN Take 5 mg by mouth at bedtime as needed for sleep.       Allergies:  Allergies  Allergen Reactions  . Sulfa Antibiotics Other (See Comments)  . Sulfonamide Derivatives     Family History: Family History  Problem Relation Age of Onset  . Cervical cancer Mother     Social History:  reports that  has never smoked.  she has never used smokeless tobacco. She reports that she does not drink alcohol or use drugs.  ROS: UROLOGY Frequent Urination?: Yes Hard to postpone urination?: Yes Burning/pain with urination?: No Get up at night to urinate?: Yes Leakage of urine?: Yes Urine stream starts and stops?: No Trouble starting stream?: No Do you have to strain to urinate?: No Blood in urine?: No Urinary tract infection?: No Sexually transmitted disease?: No Injury to kidneys or bladder?: No Painful intercourse?: No Weak stream?: Yes Currently pregnant?: No Vaginal bleeding?: No  Gastrointestinal Nausea?: No Vomiting?: No Indigestion/heartburn?: Yes Diarrhea?: No Constipation?: Yes  Constitutional Fever: No  Night sweats?: No Weight loss?: No Fatigue?: Yes  Skin Skin rash/lesions?: Yes Itching?: Yes  Eyes Blurred vision?: Yes Double vision?: No  Ears/Nose/Throat Sore throat?: No Sinus problems?: Yes  Hematologic/Lymphatic Swollen glands?: No Easy bruising?: Yes  Cardiovascular Leg swelling?: Yes Chest pain?: Yes  Respiratory Cough?: Yes Shortness of breath?: Yes  Endocrine Excessive thirst?: Yes  Musculoskeletal Back pain?: Yes Joint pain?: Yes  Neurological Headaches?: Yes Dizziness?: Yes  Psychologic Depression?: Yes Anxiety?: Yes  Physical Exam: BP (!) 146/88   Pulse 60   Resp 16   Ht 5\' 5"  (1.651 m)   Wt 180 lb (81.6 kg)   SpO2 98%   BMI 29.95 kg/m   Constitutional: Well nourished. Alert and oriented, No acute distress. HEENT: [place] AT, moist mucus membranes. Trachea midline, no masses. Cardiovascular: No clubbing, cyanosis, or edema. Respiratory: Normal respiratory effort, no increased work of breathing. GI: Abdomen is soft, non tender, non distended, no abdominal masses. Liver and spleen not palpable.  No hernias appreciated.  Stool sample for occult testing is not indicated.   GU: No CVA tenderness.  No bladder fullness or masses.  Atrophic  external genitalia, normal pubic hair distribution, no lesions.  Normal urethral meatus, no lesions, no prolapse, no discharge.   No urethral masses, tenderness and/or tenderness. No bladder fullness, tenderness or masses. Pale vagina mucosa, poor estrogen effect, no discharge, no lesions, poor pelvic support, Grade III cystocele is noted.  No rectocele noted.  Cervix and uterus are surgically absent.  No adnexal/parametria masses or tenderness noted.  Anus and perineum are without rashes or lesions.    Skin: No rashes, bruises or suspicious lesions. Lymph: No cervical or inguinal adenopathy. Neurologic: Grossly intact, no focal deficits, moving all 4 extremities. Psychiatric: Normal mood and affect.  Laboratory Data: Lab Results  Component Value Date   WBC 11.7 (H) 10/14/2016   HGB 14.3 10/14/2016   HCT 42.8 10/14/2016   MCV 87.9 10/14/2016   PLT 184 10/14/2016    Lab Results  Component Value Date   CREATININE 1.02 (H) 10/14/2016    No results found for: PSA  No results found for: TESTOSTERONE  Lab Results  Component Value Date   HGBA1C 6.3 (H) 09/24/2016    Lab Results  Component Value Date   TSH 0.143 (L) 07/18/2016    No results found for: CHOL, HDL, CHOLHDL, VLDL, LDLCALC  Lab Results  Component Value Date   AST 39 09/22/2016   Lab Results  Component Value Date   ALT 16 09/22/2016   No components found for: ALKALINEPHOPHATASE No components found for: BILIRUBINTOTAL  No results found for: ESTRADIOL  Urinalysis    Component Value Date/Time   COLORURINE AMBER (A) 10/14/2016 1415   APPEARANCEUR CLOUDY (A) 10/14/2016 1415   LABSPEC 1.020 10/14/2016 1415   PHURINE 5.0 10/14/2016 1415   GLUCOSEU NEGATIVE 10/14/2016 1415   HGBUR LARGE (A) 10/14/2016 1415   BILIRUBINUR NEGATIVE 10/14/2016 1415   KETONESUR 5 (A) 10/14/2016 1415   PROTEINUR 100 (A) 10/14/2016 1415   NITRITE NEGATIVE 10/14/2016 1415   LEUKOCYTESUR LARGE (A) 10/14/2016 1415    I have reviewed  the labs.   Pertinent Imaging: CLINICAL DATA:  Hematuria with painful urination.  EXAM: CT ABDOMEN AND PELVIS WITHOUT CONTRAST  TECHNIQUE: Multidetector CT imaging of the abdomen and pelvis was performed following the standard protocol without IV contrast.  COMPARISON:  04/11/2015  FINDINGS: Lower chest: Minimal peripheral reticulonodular density unchanged. Stable moderate size hiatal hernia.  Hepatobiliary: Few  small calcified liver granulomas. Gallbladder and biliary tree are within normal.  Pancreas: Within normal.  Spleen: Within normal.  Adrenals/Urinary Tract: Adrenal glands are normal. Kidneys are normal in size without hydronephrosis or nephrolithiasis. There is a 2.8 cm cyst over the mid pole of the left kidney. Ureters are within normal. Bladder is decompressed and otherwise within normal.  Stomach/Bowel: Small bowel is within normal. Appendix is normal. There is moderate diverticulosis of the colon most prominent over the sigmoid colon. Cecum is within the pelvis.  Vascular/Lymphatic: Mild calcified plaque over the abdominal aorta and iliac arteries. Remaining vascular structures are unremarkable. No adenopathy.  Reproductive: Previous hysterectomy.  Adnexal regions unremarkable.  Other: No free fluid or focal inflammatory change. Small umbilical hernia containing only peritoneal fat.  Musculoskeletal: Degenerative change of the spine. Subtle grade 1 anterolisthesis of L4 on L5 unchanged.  IMPRESSION: No evidence of nephroureterolithiasis or obstruction.  2.8 cm left renal cyst.  Colonic diverticulosis without active inflammation.  Stable moderate size hiatal hernia.  Aortic atherosclerosis.   Electronically Signed   By: Elberta Fortis M.D.   On: 10/14/2016 16:52 I have independently reviewed the films.   Results for Abigail, Strong (MRN [id number]) as of 11/17/2017 21:55  Ref. Range 10/29/2017 14:43  Scan Result Unknown  17ml    Assessment & Plan:    1. Mixed Incontinence  - offered behavioral therapies, bladder training, bladder control strategies and pelvic floor muscle training - patient deferred  - fluid management - good water intake  - offered medical therapy with beta-3 adrenergic receptor agonist and the potential side effects of therapy   - offered refer to gynecology for a pessary fitting - patient deferred  - would like to try the beta-3 adrenergic receptor agonist (Myrbetriq).  Given Myrbetriq 25 mg samples, #28.  I have reviewed with the patient of the side effects of Myrbetriq, such as: elevation in BP, urinary retention and/or HA.    - RTC in 3 weeks for PVR and OAB questionnaire   - BLADDER SCAN AMB NON-IMAGING  2. Vaginal atrophy Will not initiate vaginal estrogen cream at this time as she has not had a recent mammogram  3. Cystocele Not bothersome to the patient at this time  Return in about 3 weeks (around 11/19/2017) for PVR and OAB questionnaire.  These notes generated with voice recognition software. I apologize for typographical errors.  Michiel Cowboy, PA-C  [facility] [address] [place], [place] [address] [phone]

## 2017-11-14 DIAGNOSIS — F5102 Adjustment insomnia: Secondary | ICD-10-CM | POA: Diagnosis not present

## 2017-11-14 DIAGNOSIS — M7989 Other specified soft tissue disorders: Secondary | ICD-10-CM | POA: Diagnosis not present

## 2017-11-15 ENCOUNTER — Other Ambulatory Visit: Payer: Self-pay | Admitting: Internal Medicine

## 2017-11-15 DIAGNOSIS — M7989 Other specified soft tissue disorders: Secondary | ICD-10-CM

## 2017-11-18 ENCOUNTER — Ambulatory Visit
Admission: RE | Admit: 2017-11-18 | Discharge: 2017-11-18 | Disposition: A | Discharge status: Home / Self Care | Payer: Medicare Other | Source: Ambulatory Visit | Attending: Internal Medicine | Admitting: Internal Medicine

## 2017-11-18 DIAGNOSIS — M7121 Synovial cyst of popliteal space [Baker], right knee: Secondary | ICD-10-CM | POA: Insufficient documentation

## 2017-11-18 DIAGNOSIS — M7989 Other specified soft tissue disorders: Secondary | ICD-10-CM

## 2017-11-18 DIAGNOSIS — M7122 Synovial cyst of popliteal space [Baker], left knee: Secondary | ICD-10-CM | POA: Diagnosis not present

## 2017-11-18 DIAGNOSIS — R609 Edema, unspecified: Secondary | ICD-10-CM | POA: Diagnosis present

## 2017-11-18 DIAGNOSIS — R600 Localized edema: Secondary | ICD-10-CM | POA: Diagnosis not present

## 2017-11-18 NOTE — Progress Notes (Signed)
11/19/2017 4:41 PM   Abigail Strong 01-Nov-1929 [id number]  Referring provider: Corky Downs, MD [address] [place], [place] [address]  Chief Complaint  Patient presents with  . Urinary Incontinence    HPI: Patient is a 84 -year-old Caucasian female with mixed incontinence, vaginal atrophy and a cystocele who presents today for a 3 week follow up after a trial of Myrbetriq 25 mg daily.    Background history Patient was referred to [place] by Dr. Corky Downs for urinary incontinence.  Patient states that she has had urinary incontinence for several months.  Patient has incontinence with stress and urge.   She is wearing two pads during the day and one pad at night.  She is having associated urinary frequency 9 she goes so many times during the day that she just ends up sitting on the toilet for extended periods of time), a strong urgency, nocturia (every two hours at night, she does use her CPAP machine) and weak urinary stream.   Patient denies any gross hematuria, dysuria or suprapubic/flank pain.  Patient denies any fevers, chills, nausea or vomiting.  Her PVR is 17 mL.  She does not have a history of urinary tract infections, STI's or injury to the bladder.  She does not have a history of nephrolithiasis, GU surgery or GU trauma.  She is not sexually active.  She is post menopausal.   She admits to constipation.  She is not having pain with bladder filling.  CT Renal stone study performed on 10/14/2016 noted the adrenal glands are normal. Kidneys are normal in size without hydronephrosis or nephrolithiasis. There is a 2.8 cm cyst over the mid pole of the left kidney. Ureters are within normal. Bladder is decompressed and otherwise within normal.  She is drinking 2 quarts of water daily.   She is drinking one cup of coffee in the morning.  She does not drink sodas, teas or alcohol.    Today (11/19/2017), the patient has been experiencing urgency x 4-7 (improved), frequency x 4-7 (improved),  not restricting fluids to avoid visits to the restroom, is engaging in toilet mapping, incontinence x 4-7 (improved) and nocturia x 4-7 (improved).   Her PVR is 0 mL.  Her BP is 110/75.  Patient denies any gross hematuria, dysuria or suprapubic/flank pain.  Patient denies any fevers, chills, nausea or vomiting.   She feels the Myrbetriq 25 mg daily is working for her.     PMH: Past Medical History:  Diagnosis Date  . Depression   . GERD (gastroesophageal reflux disease)   . Hyperlipidemia   . Hypothyroidism   . Insomnia     Surgical History: Past Surgical History:  Procedure Laterality Date  . ABDOMINAL HYSTERECTOMY    . cataracts    . COLON SURGERY     partial colectomy  . intestinal blockage    . NISSEN FUNDOPLICATION      Home Medications:  Allergies as of 11/19/2017      Reactions   Sulfa Antibiotics Other (See Comments)   Sulfonamide Derivatives       Medication List        Accurate as of 11/19/17  4:41 PM. Always use your most recent med list.          acetaminophen 500 MG tablet Commonly known as:  TYLENOL Take 1,000 mg by mouth every 6 (six) hours as needed.   albuterol (2.5 MG/3ML) 0.083% nebulizer solution Commonly known as:  PROVENTIL Take 3 mLs (2.5  mg total) by nebulization every 4 (four) hours as needed for wheezing or shortness of breath.   albuterol (2.5 MG/3ML) 0.083% nebulizer solution Commonly known as:  PROVENTIL Take 3 mLs (2.5 mg total) by nebulization every 6 (six) hours as needed for wheezing or shortness of breath.   ALPRAZolam 0.25 MG tablet Commonly known as:  XANAX Take 0.25 mg by mouth at bedtime.   aspirin EC 81 MG tablet Take 81 mg by mouth daily.   CENTRUM SILVER ADULT 50+ Tabs Take 1 tablet by mouth daily.   clopidogrel 75 MG tablet Commonly known as:  PLAVIX   escitalopram 10 MG tablet Commonly known as:  LEXAPRO escitalopram 10 mg tablet   feeding supplement (ENSURE ENLIVE) Liqd Take 237 mLs by mouth 2 (two) times  daily between meals.   levothyroxine 125 MCG tablet Commonly known as:  SYNTHROID, LEVOTHROID Take 125 mcg by mouth daily before breakfast.   mirabegron ER 25 MG Tb24 tablet Commonly known as:  MYRBETRIQ Take 1 tablet (25 mg total) by mouth daily.   omeprazole 40 MG capsule Commonly known as:  PRILOSEC Take 40 mg by mouth 2 (two) times daily.   pravastatin 40 MG tablet Commonly known as:  PRAVACHOL Take 40 mg by mouth at bedtime.   vitamin C 1000 MG tablet Take 1,000 mg by mouth daily.   Vitamin D3 5000 units Caps Take 5,000 Units by mouth daily.       Allergies:  Allergies  Allergen Reactions  . Sulfa Antibiotics Other (See Comments)  . Sulfonamide Derivatives     Family History: Family History  Problem Relation Age of Onset  . Cervical cancer Mother     Social History:  reports that she has never smoked. She has never used smokeless tobacco. She reports that she does not drink alcohol or use drugs.  ROS: UROLOGY Frequent Urination?: Yes Hard to postpone urination?: No Burning/pain with urination?: No Get up at night to urinate?: Yes Leakage of urine?: Yes Urine stream starts and stops?: No Trouble starting stream?: No Do you have to strain to urinate?: No Blood in urine?: No Urinary tract infection?: No Sexually transmitted disease?: No Injury to kidneys or bladder?: No Painful intercourse?: No Weak stream?: No Currently pregnant?: No Vaginal bleeding?: No Last menstrual period?: n  Gastrointestinal Nausea?: No Vomiting?: No Indigestion/heartburn?: No Diarrhea?: No Constipation?: No  Constitutional Fever: No Night sweats?: No Weight loss?: No Fatigue?: No  Skin Skin rash/lesions?: No Itching?: No  Eyes Blurred vision?: No Double vision?: No  Ears/Nose/Throat Sore throat?: No Sinus problems?: No  Hematologic/Lymphatic Swollen glands?: No Easy bruising?: Yes  Cardiovascular Leg swelling?: Yes Chest pain?:  No  Respiratory Cough?: No Shortness of breath?: Yes  Endocrine Excessive thirst?: No  Musculoskeletal Back pain?: No Joint pain?: Yes  Neurological Headaches?: No Dizziness?: No  Psychologic Depression?: Yes Anxiety?: No  Physical Exam: BP 110/75 (BP Location: Right Arm, Patient Position: Sitting, Cuff Size: Large)   Pulse 87   Ht 5\' 5"  (1.651 m)   Wt 170 lb 4.8 oz (77.2 kg)   BMI 28.34 kg/m   Constitutional: Well nourished. Alert and oriented, No acute distress. HEENT: [place] AT, moist mucus membranes. Trachea midline. Cardiovascular: No clubbing, cyanosis, or edema. Respiratory: Normal respiratory effort, no increased work of breathing. Skin: No rashes, bruises or suspicious lesions. Neurologic: Grossly intact, no focal deficits, moving all 4 extremities. Psychiatric: Normal mood and affect.  Laboratory Data: Lab Results  Component Value Date   WBC 11.7 (  H) 10/14/2016   HGB 14.3 10/14/2016   HCT 42.8 10/14/2016   MCV 87.9 10/14/2016   PLT 184 10/14/2016    Lab Results  Component Value Date   CREATININE 1.02 (H) 10/14/2016    No results found for: PSA  No results found for: TESTOSTERONE  Lab Results  Component Value Date   HGBA1C 6.3 (H) 09/24/2016    Lab Results  Component Value Date   TSH 0.143 (L) 07/18/2016    No results found for: CHOL, HDL, CHOLHDL, VLDL, LDLCALC  Lab Results  Component Value Date   AST 39 09/22/2016   Lab Results  Component Value Date   ALT 16 09/22/2016   No components found for: ALKALINEPHOPHATASE No components found for: BILIRUBINTOTAL  No results found for: ESTRADIOL  Urinalysis    Component Value Date/Time   COLORURINE AMBER (A) 10/14/2016 1415   APPEARANCEUR CLOUDY (A) 10/14/2016 1415   LABSPEC 1.020 10/14/2016 1415   PHURINE 5.0 10/14/2016 1415   GLUCOSEU NEGATIVE 10/14/2016 1415   HGBUR LARGE (A) 10/14/2016 1415   BILIRUBINUR NEGATIVE 10/14/2016 1415   KETONESUR 5 (A) 10/14/2016 1415   PROTEINUR  100 (A) 10/14/2016 1415   NITRITE NEGATIVE 10/14/2016 1415   LEUKOCYTESUR LARGE (A) 10/14/2016 1415    I have reviewed the labs.   Pertinent Imaging: Results for FRONIA, HAWTHORNE (MRN [id number]) as of 12/04/2017 11:12  Ref. Range 11/19/2017 16:12  Scan Result Unknown 0   Assessment & Plan:    1. Mixed Incontinence Feels that the Myrbetriq is helping her reach her goals Continue Myrbetriq 25 mg daily; prescription is given RTC in 3 months for OAB questionnaire and PVR BLADDER SCAN AMB NON-IMAGING  2. Vaginal atrophy Will not initiate vaginal estrogen cream at this time as she has not had a recent mammogram  3. Cystocele Not bothersome to the patient at this time  Return in about 3 months (around 02/18/2018) for PVR and OAB questionnaire.  These notes generated with voice recognition software. I apologize for typographical errors.  Michiel Cowboy, PA-C  [facility] [address] [place], [place] [address] [phone]

## 2017-11-18 DEATH — deceased

## 2017-11-19 ENCOUNTER — Ambulatory Visit: Payer: Medicare Other | Admitting: Urology

## 2017-11-19 ENCOUNTER — Encounter: Payer: Self-pay | Admitting: Urology

## 2017-11-19 VITALS — BP 110/75 | HR 87 | Ht 65.0 in | Wt 170.3 lb

## 2017-11-19 DIAGNOSIS — N3946 Mixed incontinence: Secondary | ICD-10-CM | POA: Diagnosis not present

## 2017-11-19 DIAGNOSIS — N8111 Cystocele, midline: Secondary | ICD-10-CM

## 2017-11-19 DIAGNOSIS — N952 Postmenopausal atrophic vaginitis: Secondary | ICD-10-CM | POA: Diagnosis not present

## 2017-11-19 LAB — BLADDER SCAN AMB NON-IMAGING: Scan Result: 0

## 2017-11-19 MED ORDER — MIRABEGRON ER 25 MG PO TB24: 25.0000 mg | ORAL_TABLET | Freq: Every day | ORAL | 12 refills | Status: DC

## 2017-12-24 DIAGNOSIS — R0602 Shortness of breath: Secondary | ICD-10-CM | POA: Diagnosis not present

## 2017-12-24 DIAGNOSIS — M76892 Other specified enthesopathies of left lower limb, excluding foot: Secondary | ICD-10-CM | POA: Diagnosis not present

## 2017-12-24 DIAGNOSIS — J209 Acute bronchitis, unspecified: Secondary | ICD-10-CM | POA: Diagnosis not present

## 2018-01-06 ENCOUNTER — Telehealth: Payer: Self-pay | Admitting: Urology

## 2018-01-06 NOTE — Telephone Encounter (Signed)
Please advise 

## 2018-01-06 NOTE — Telephone Encounter (Signed)
Patient called and said she went to get her myrbetriq filled and it cost to much and she is out of samples. Is there anything else she can take or can she have more samples?   Sharyn Lull

## 2018-01-14 ENCOUNTER — Other Ambulatory Visit: Payer: Self-pay | Admitting: Urology

## 2018-01-14 NOTE — Telephone Encounter (Signed)
Called pt. No answer °

## 2018-01-14 NOTE — Progress Notes (Signed)
Error

## 2018-01-14 NOTE — Telephone Encounter (Signed)
Epic is not indicating her formulary coverage for OAB medications.  The other medications that are available to treat bladder issues are in the anticholinergic family.  These medications have the side effects of dry eyes, dry mouth and constipation.  They have also been implicated in memory disturbance in individuals over the age of 87.  If she understands these risks and is still wanting to try a medication from the anticholinergic family, we can send a prescription for oxybutynin XL 10 mg daily.

## 2018-01-16 ENCOUNTER — Telehealth: Payer: Self-pay | Admitting: Urology

## 2018-01-16 NOTE — Telephone Encounter (Signed)
Patient stated that she did not want to try the medication and would discuss other options at her follow up app. I offered her a sooner app and she declined one.   Sharyn Lull

## 2018-02-17 NOTE — Progress Notes (Deleted)
02/18/2018 8:13 PM   Abigail Strong [birth year] [id number]  Referring provider: Corky Downs, MD [address] [place], [place] [address]  No chief complaint on file.   HPI: Patient is a 84 -year-old Caucasian female with mixed incontinence, vaginal atrophy and a cystocele who presents today for a 3 month follow up after a trial of Myrbetriq 25 mg daily.    Background history Patient was referred to [place] by Dr. Corky Downs for urinary incontinence.  Patient states that she has had urinary incontinence for several months.  Patient has incontinence with stress and urge.   She is wearing two pads during the day and one pad at night.  She is having associated urinary frequency 9 she goes so many times during the day that she just ends up sitting on the toilet for extended periods of time), a strong urgency, nocturia (every two hours at night, she does use her CPAP machine) and weak urinary stream.   Patient denies any gross hematuria, dysuria or suprapubic/flank pain.  Patient denies any fevers, chills, nausea or vomiting.  Her PVR is 17 mL.  She does not have a history of urinary tract infections, STI's or injury to the bladder.  She does not have a history of nephrolithiasis, GU surgery or GU trauma.  She is not sexually active.  She is post menopausal.   She admits to constipation.  She is not having pain with bladder filling.  CT Renal stone study performed on 10/14/2016 noted the adrenal glands are normal. Kidneys are normal in size without hydronephrosis or nephrolithiasis. There is a 2.8 cm cyst over the mid pole of the left kidney. Ureters are within normal. Bladder is decompressed and otherwise within normal.  She is drinking 2 quarts of water daily.   She is drinking one cup of coffee in the morning.  She does not drink sodas, teas or alcohol.    (11/19/2017), the patient has been experiencing urgency x 4-7 (improved), frequency x 4-7 (improved), not restricting fluids to avoid visits to  the restroom, is engaging in toilet mapping, incontinence x 4-7 (improved) and nocturia x 4-7 (improved).   Her PVR is 0 mL.  Her BP is 110/75.  Patient denies any gross hematuria, dysuria or suprapubic/flank pain.  Patient denies any fevers, chills, nausea or vomiting.   She feels the Myrbetriq 25 mg daily is working for her.    The patient has been experiencing urgency x *** (***), frequency x *** (***), not/is restricting fluids to avoid visits to the restroom ***, not/is engaging in toilet mapping, incontinence x *** (***) and nocturia x *** (***).  BP is ***.  PVR is *** Patient denies any gross hematuria, dysuria or suprapubic/flank pain.  Patient denies any fevers, chills, nausea or vomiting.   Myrbetriq was found to be cost prohibitive.      PMH: Past Medical History:  Diagnosis Date  . Depression   . GERD (gastroesophageal reflux disease)   . Hyperlipidemia   . Hypothyroidism   . Insomnia     Surgical History: Past Surgical History:  Procedure Laterality Date  . ABDOMINAL HYSTERECTOMY    . cataracts    . COLON SURGERY     partial colectomy  . intestinal blockage    . NISSEN FUNDOPLICATION      Home Medications:  Allergies as of 02/18/2018      Reactions   Sulfa Antibiotics Other (See Comments)   Sulfonamide Derivatives  Medication List        Accurate as of 02/17/18  8:13 PM. Always use your most recent med list.          acetaminophen 500 MG tablet Commonly known as:  TYLENOL Take 1,000 mg by mouth every 6 (six) hours as needed.   albuterol (2.5 MG/3ML) 0.083% nebulizer solution Commonly known as:  PROVENTIL Take 3 mLs (2.5 mg total) by nebulization every 4 (four) hours as needed for wheezing or shortness of breath.   albuterol (2.5 MG/3ML) 0.083% nebulizer solution Commonly known as:  PROVENTIL Take 3 mLs (2.5 mg total) by nebulization every 6 (six) hours as needed for wheezing or shortness of breath.   ALPRAZolam 0.25 MG tablet Commonly known as:   XANAX Take 0.25 mg by mouth at bedtime.   aspirin EC 81 MG tablet Take 81 mg by mouth daily.   CENTRUM SILVER ADULT 50+ Tabs Take 1 tablet by mouth daily.   clopidogrel 75 MG tablet Commonly known as:  PLAVIX   escitalopram 10 MG tablet Commonly known as:  LEXAPRO escitalopram 10 mg tablet   feeding supplement (ENSURE ENLIVE) Liqd Take 237 mLs by mouth 2 (two) times daily between meals.   levothyroxine 125 MCG tablet Commonly known as:  SYNTHROID, LEVOTHROID Take 125 mcg by mouth daily before breakfast.   mirabegron ER 25 MG Tb24 tablet Commonly known as:  MYRBETRIQ Take 1 tablet (25 mg total) by mouth daily.   omeprazole 40 MG capsule Commonly known as:  PRILOSEC Take 40 mg by mouth 2 (two) times daily.   pravastatin 40 MG tablet Commonly known as:  PRAVACHOL Take 40 mg by mouth at bedtime.   vitamin C 1000 MG tablet Take 1,000 mg by mouth daily.   Vitamin D3 5000 units Caps Take 5,000 Units by mouth daily.       Allergies:  Allergies  Allergen Reactions  . Sulfa Antibiotics Other (See Comments)  . Sulfonamide Derivatives     Family History: Family History  Problem Relation Age of Onset  . Cervical cancer Mother     Social History:  reports that she has never smoked. She has never used smokeless tobacco. She reports that she does not drink alcohol or use drugs.  ROS:                                        Physical Exam: There were no vitals taken for this visit.  Constitutional: Well nourished. Alert and oriented, No acute distress. HEENT: [facility] AT, moist mucus membranes. Trachea midline, no masses. Cardiovascular: No clubbing, cyanosis, or edema. Respiratory: Normal respiratory effort, no increased work of breathing. GI: Abdomen is soft, non tender, non distended, no abdominal masses. Liver and spleen not palpable.  No hernias appreciated.  Stool sample for occult testing is not indicated.   GU: No CVA tenderness.  No  bladder fullness or masses.   Skin: No rashes, bruises or suspicious lesions. Lymph: No cervical or inguinal adenopathy. Neurologic: Grossly intact, no focal deficits, moving all 4 extremities. Psychiatric: Normal mood and affect.   Laboratory Data: Lab Results  Component Value Date   WBC 11.7 (H) 10/14/2016   HGB 14.3 10/14/2016   HCT 42.8 10/14/2016   MCV 87.9 10/14/2016   PLT 184 10/14/2016    Lab Results  Component Value Date   CREATININE 1.02 (H) 10/14/2016    No results found  for: PSA  No results found for: TESTOSTERONE  Lab Results  Component Value Date   HGBA1C 6.3 (H) 09/24/2016    Lab Results  Component Value Date   TSH 0.143 (L) 07/18/2016    No results found for: CHOL, HDL, CHOLHDL, VLDL, LDLCALC  Lab Results  Component Value Date   AST 39 09/22/2016   Lab Results  Component Value Date   ALT 16 09/22/2016   No components found for: ALKALINEPHOPHATASE No components found for: BILIRUBINTOTAL  No results found for: ESTRADIOL  Urinalysis    Component Value Date/Time   COLORURINE AMBER (A) 10/14/2016 1415   APPEARANCEUR CLOUDY (A) 10/14/2016 1415   LABSPEC 1.020 10/14/2016 1415   PHURINE 5.0 10/14/2016 1415   GLUCOSEU NEGATIVE 10/14/2016 1415   HGBUR LARGE (A) 10/14/2016 1415   BILIRUBINUR NEGATIVE 10/14/2016 1415   KETONESUR 5 (A) 10/14/2016 1415   PROTEINUR 100 (A) 10/14/2016 1415   NITRITE NEGATIVE 10/14/2016 1415   LEUKOCYTESUR LARGE (A) 10/14/2016 1415    I have reviewed the labs.   Pertinent Imaging: ***  Assessment & Plan:    1. Mixed Incontinence Feels that the Myrbetriq is helping her reach her goals Continue Myrbetriq 25 mg daily; prescription is given RTC in 3 months for OAB questionnaire and PVR BLADDER SCAN AMB NON-IMAGING  2. Vaginal atrophy Will not initiate vaginal estrogen cream at this time as she has not had a recent mammogram  3. Cystocele Not bothersome to the patient at this time  No follow-ups on  file.  These notes generated with voice recognition software. I apologize for typographical errors.  Michiel Cowboy, PA-C  [facility] [address] [place], [place] [address] [phone]

## 2018-02-18 ENCOUNTER — Ambulatory Visit: Payer: Medicare Other | Admitting: Urology

## 2018-02-18 ENCOUNTER — Encounter: Payer: Self-pay | Admitting: Urology

## 2018-02-27 DIAGNOSIS — R911 Solitary pulmonary nodule: Secondary | ICD-10-CM | POA: Diagnosis not present

## 2018-02-27 DIAGNOSIS — G451 Carotid artery syndrome (hemispheric): Secondary | ICD-10-CM | POA: Diagnosis not present

## 2018-02-28 ENCOUNTER — Other Ambulatory Visit: Payer: Self-pay | Admitting: Internal Medicine

## 2018-02-28 DIAGNOSIS — Z1231 Encounter for screening mammogram for malignant neoplasm of breast: Secondary | ICD-10-CM

## 2018-03-06 DIAGNOSIS — G451 Carotid artery syndrome (hemispheric): Secondary | ICD-10-CM | POA: Diagnosis not present

## 2018-03-06 DIAGNOSIS — R911 Solitary pulmonary nodule: Secondary | ICD-10-CM | POA: Diagnosis not present

## 2018-03-06 DIAGNOSIS — R0602 Shortness of breath: Secondary | ICD-10-CM | POA: Diagnosis not present

## 2018-03-28 ENCOUNTER — Emergency Department: Payer: Medicare Other

## 2018-03-28 ENCOUNTER — Emergency Department
Admission: EM | Admit: 2018-03-28 | Discharge: 2018-03-28 | Disposition: A | Discharge status: Home / Self Care | Payer: Medicare Other | Attending: Emergency Medicine | Admitting: Emergency Medicine

## 2018-03-28 ENCOUNTER — Other Ambulatory Visit: Payer: Self-pay

## 2018-03-28 DIAGNOSIS — R531 Weakness: Secondary | ICD-10-CM | POA: Diagnosis not present

## 2018-03-28 DIAGNOSIS — I10 Essential (primary) hypertension: Secondary | ICD-10-CM | POA: Insufficient documentation

## 2018-03-28 DIAGNOSIS — N390 Urinary tract infection, site not specified: Secondary | ICD-10-CM | POA: Diagnosis not present

## 2018-03-28 DIAGNOSIS — R42 Dizziness and giddiness: Secondary | ICD-10-CM

## 2018-03-28 DIAGNOSIS — J439 Emphysema, unspecified: Secondary | ICD-10-CM | POA: Diagnosis not present

## 2018-03-28 DIAGNOSIS — J449 Chronic obstructive pulmonary disease, unspecified: Secondary | ICD-10-CM | POA: Diagnosis not present

## 2018-03-28 DIAGNOSIS — M542 Cervicalgia: Secondary | ICD-10-CM | POA: Diagnosis not present

## 2018-03-28 DIAGNOSIS — E039 Hypothyroidism, unspecified: Secondary | ICD-10-CM | POA: Insufficient documentation

## 2018-03-28 LAB — GLUCOSE, CAPILLARY: Glucose-Capillary: 84 mg/dL (ref 70–99)

## 2018-03-28 LAB — CBC WITH DIFFERENTIAL/PLATELET
Basophils Absolute: 0.1 10*3/uL (ref 0–0.1)
Basophils Relative: 1 %
Eosinophils Absolute: 0.1 10*3/uL (ref 0–0.7)
Eosinophils Relative: 1 %
HCT: 39.7 % (ref 35.0–47.0)
Hemoglobin: 13.5 g/dL (ref 12.0–16.0)
Lymphocytes Relative: 40 %
Lymphs Abs: 3.6 10*3/uL (ref 1.0–3.6)
MCH: 29.9 pg (ref 26.0–34.0)
MCHC: 34 g/dL (ref 32.0–36.0)
MCV: 88 fL (ref 80.0–100.0)
Monocytes Absolute: 0.9 10*3/uL (ref 0.2–0.9)
Monocytes Relative: 10 %
Neutro Abs: 4.4 10*3/uL (ref 1.4–6.5)
Neutrophils Relative %: 48 %
Platelets: 259 10*3/uL (ref 150–440)
RBC: 4.51 MIL/uL (ref 3.80–5.20)
RDW: 14.2 % (ref 11.5–14.5)
WBC: 9.1 10*3/uL (ref 3.6–11.0)

## 2018-03-28 LAB — COMPREHENSIVE METABOLIC PANEL
ALT: 12 U/L (ref 0–44)
AST: 20 U/L (ref 15–41)
Albumin: 3.9 g/dL (ref 3.5–5.0)
Alkaline Phosphatase: 88 U/L (ref 38–126)
Anion gap: 9 (ref 5–15)
BUN: 21 mg/dL (ref 8–23)
CO2: 30 mmol/L (ref 22–32)
Calcium: 9.7 mg/dL (ref 8.9–10.3)
Chloride: 104 mmol/L (ref 98–111)
Creatinine, Ser: 0.79 mg/dL (ref 0.44–1.00)
GFR calc Af Amer: >60 mL/min (ref >60)
GFR calc non Af Amer: >60 mL/min (ref >60)
Glucose, Bld: 100 mg/dL — ABNORMAL HIGH (ref 70–99)
Potassium: 4 mmol/L (ref 3.5–5.1)
Sodium: 143 mmol/L (ref 135–145)
Total Bilirubin: 0.7 mg/dL (ref 0.3–1.2)
Total Protein: 7 g/dL (ref 6.5–8.1)

## 2018-03-28 LAB — URINALYSIS, COMPLETE (UACMP) WITH MICROSCOPIC
Bilirubin Urine: NEGATIVE
Glucose, UA: NEGATIVE mg/dL
Hgb urine dipstick: NEGATIVE
Ketones, ur: 5 mg/dL — AB
Nitrite: NEGATIVE
Protein, ur: NEGATIVE mg/dL
Specific Gravity, Urine: 1.016 (ref 1.005–1.030)
pH: 8 (ref 5.0–8.0)

## 2018-03-28 LAB — TROPONIN I: Troponin I: <0.03 ng/mL (ref <0.03)

## 2018-03-28 MED ORDER — CEPHALEXIN 250 MG PO CAPS: 250.0000 mg | ORAL_CAPSULE | Freq: Four times a day (QID) | ORAL | 0 refills | Status: AC

## 2018-03-28 MED ORDER — MECLIZINE HCL 25 MG PO TABS
25.0000 mg | ORAL_TABLET | Freq: Once | ORAL | Status: AC
  Administered 2018-03-28: 25 mg via ORAL
  Filled 2018-03-28: qty 1

## 2018-03-28 MED ORDER — MECLIZINE HCL 25 MG PO TABS: 25.0000 mg | ORAL_TABLET | Freq: Three times a day (TID) | ORAL | 1 refills | Status: DC | PRN

## 2018-03-28 MED ORDER — CEPHALEXIN 250 MG PO CAPS
250.0000 mg | ORAL_CAPSULE | Freq: Once | ORAL | Status: AC
  Administered 2018-03-28: 250 mg via ORAL
  Filled 2018-03-28: qty 1

## 2018-03-28 NOTE — ED Notes (Signed)
Patient transported to X-ray 

## 2018-03-28 NOTE — ED Triage Notes (Signed)
C/o generalized weakness and dizziness with ambulating for the last few days. Pt c/o right neck pain. Pt alert and oriented X4, active, cooperative, pt in NAD. RR even and unlabored, color WNL.  CBG 102 with EMS. VSS with EMS

## 2018-03-28 NOTE — ED Provider Notes (Signed)
[facility] Emergency Department Provider Note       Time seen: ----------------------------------------- 3:06 PM on 03/28/2018 -----------------------------------------   I have reviewed the triage vital signs and the nursing notes.  HISTORY   Chief Complaint Weakness and Dizziness    HPI Abigail Strong is a 84 y.o. female with a history of depression, GERD, hyperlipidemia, hypothyroidism, insomnia who presents to the ED for generalized weakness and dizziness with ambulating for the last few days.  She has had some neck pain intermittently.  She denies fevers, chills or other complaints.  She was able to walk with some assistance prior to arrival.  Patient states she has been using her walker.  Past Medical History:  Diagnosis Date  . Depression   . GERD (gastroesophageal reflux disease)   . Hyperlipidemia   . Hypothyroidism   . Insomnia     Patient Active Problem List   Diagnosis Date Noted  . Arthritis of knee 02/19/2017  . COPD exacerbation ([place]) 09/27/2016  . Pneumonia 09/27/2016  . Influenza A 09/27/2016  . Prediabetes 09/27/2016  . Leukocytosis 09/27/2016  . Chronic fatigue, unspecified 09/27/2016  . Musculoskeletal chest pain 09/27/2016  . Acute respiratory failure with hypoxia ([place]) 09/22/2016  . Bronchitis 09/09/2016  . Osteoarthritis of knee 03/23/2016  . Diverticulitis 03/23/2015  . Depression 01/01/2014  . GERD (gastroesophageal reflux disease) 01/01/2014  . Hyperlipidemia, unspecified 01/01/2014  . Hypothyroidism, unspecified 01/01/2014  . OA (osteoarthritis) 01/01/2014  . HYPERTENSION 12/08/2009  . COUGH 12/08/2009    Past Surgical History:  Procedure Laterality Date  . ABDOMINAL HYSTERECTOMY    . cataracts    . COLON SURGERY     partial colectomy  . intestinal blockage    . NISSEN FUNDOPLICATION      Allergies Sulfa antibiotics and Sulfonamide derivatives  Social History Social History   Tobacco Use  .  Smoking status: Never Smoker  . Smokeless tobacco: Never Used  Substance Use Topics  . Alcohol use: No  . Drug use: No   Review of Systems Constitutional: Negative for fever. Cardiovascular: Negative for chest pain. Respiratory: Negative for shortness of breath. Gastrointestinal: Negative for abdominal pain, vomiting and diarrhea. Musculoskeletal: Positive for neck pain Skin: Negative for rash. Neurological: Positive for weakness  All systems negative/normal/unremarkable except as stated in the HPI  ____________________________________________   PHYSICAL EXAM:  VITAL SIGNS: ED Triage Vitals [03/28/18 1504]  Enc Vitals Group     BP 115/67     Pulse Rate 77     Resp 18     Temp 97.7 F (36.5 C)     Temp Source Oral     SpO2 96 %     Weight 160 lb (72.6 kg)     Height 5\' 5"  (1.651 m)     Head Circumference      Peak Flow      Pain Score 3     Pain Loc      Pain Edu?      Excl. in [place]?    Constitutional: Alert and oriented. Well appearing and in no distress. Eyes: Conjunctivae are normal. Normal extraocular movements. ENT   Head: Normocephalic and atraumatic.   Nose: No congestion/rhinnorhea.   Mouth/Throat: Mucous membranes are moist.   Neck: No stridor. Cardiovascular: Normal rate, regular rhythm. No murmurs, rubs, or gallops. Respiratory: Normal respiratory effort without tachypnea nor retractions. Breath sounds are clear and equal bilaterally. No wheezes/rales/rhonchi. Gastrointestinal: Soft and nontender. Normal bowel sounds Musculoskeletal: Nontender with normal  range of motion in extremities. No lower extremity tenderness nor edema. Neurologic:  Normal speech and language. No gross focal neurologic deficits are appreciated.  Strength appears to be normal Skin:  Skin is warm, dry and intact. No rash noted. Psychiatric: Mood and affect are normal. Speech and behavior are normal.  ____________________________________________  EKG: Interpreted by me.   Sinus rhythm rate of 71 bpm, low voltage, normal QRS, normal QT, left anterior fascicular block  ____________________________________________  ED COURSE:  As part of my medical decision making, I reviewed the following data within the [place] History obtained from family if available, nursing notes, old chart and ekg, as well as notes from prior ED visits. Patient presented for weakness and dizziness with neck pain, we will assess with labs and imaging as indicated at this time.   Procedures ____________________________________________   LABS (pertinent positives/negatives)  Labs Reviewed  COMPREHENSIVE METABOLIC PANEL - Abnormal; Notable for the following components:      Result Value   Glucose, Bld 100 (*)    All other components within normal limits  URINALYSIS, COMPLETE (UACMP) WITH MICROSCOPIC - Abnormal; Notable for the following components:   Color, Urine YELLOW (*)    APPearance HAZY (*)    Ketones, ur 5 (*)    Leukocytes, UA SMALL (*)    Bacteria, UA RARE (*)    All other components within normal limits  CBC WITH DIFFERENTIAL/PLATELET  TROPONIN I  GLUCOSE, CAPILLARY  CBG MONITORING, ED    RADIOLOGY Images were viewed by me  CT head, C-spine IMPRESSION: Head CT:  Negative for acute intracranial abnormality.  Cervical CT:  No acute fracture or malalignment of the cervical spine.  Degenerative disc disease of the cervical spine, with foraminal narrowing as above. ____________________________________________  DIFFERENTIAL DIAGNOSIS   Dehydration, electrolyte abnormality, vertigo, CVA, medication side effect  FINAL ASSESSMENT AND PLAN  Weakness, UTI   Plan: The patient had presented for weakness. Patient's labs did reveal mild urinary tract infection likely but otherwise unremarkable. Patient's imaging not reveal any acute process.  She was able to ambulate here without any difficulty using a walker.  She will be placed on  antibiotics and meclizine to take as needed.  She is cleared for outpatient follow-up.   Laurence Aly, MD   Note: This note was generated in part or whole with voice recognition software. Voice recognition is usually quite accurate but there are transcription errors that can and very often do occur. I apologize for any typographical errors that were not detected and corrected.     Earleen Newport, MD 03/28/18 Drema Halon

## 2018-03-28 NOTE — ED Notes (Signed)
Pt ambulated with walker in room by this RN.  Pt states feels better, walking with steady gait.  MD notified.

## 2018-04-14 DIAGNOSIS — H43813 Vitreous degeneration, bilateral: Secondary | ICD-10-CM | POA: Diagnosis not present

## 2018-05-12 DIAGNOSIS — R609 Edema, unspecified: Secondary | ICD-10-CM | POA: Diagnosis not present

## 2018-05-12 DIAGNOSIS — Z87448 Personal history of other diseases of urinary system: Secondary | ICD-10-CM | POA: Diagnosis not present

## 2018-05-12 DIAGNOSIS — M549 Dorsalgia, unspecified: Secondary | ICD-10-CM | POA: Diagnosis not present

## 2018-05-12 DIAGNOSIS — R61 Generalized hyperhidrosis: Secondary | ICD-10-CM | POA: Diagnosis not present

## 2018-05-29 IMAGING — CR DG CHEST 2V
1 series · 2 of 2 positions shown · non-contrast
Comparison: 01/17/2016

CLINICAL DATA: Pt reports has been diagnosed with pneumonia and
taken antibiotics and is not getting better. Pt reports SOB,
weakness and cough

EXAM:
CHEST  2 VIEW

[Series 1: dg chest 2 view · 0.14mm/px · 2 of 2 slices shown]
[im 1/2]
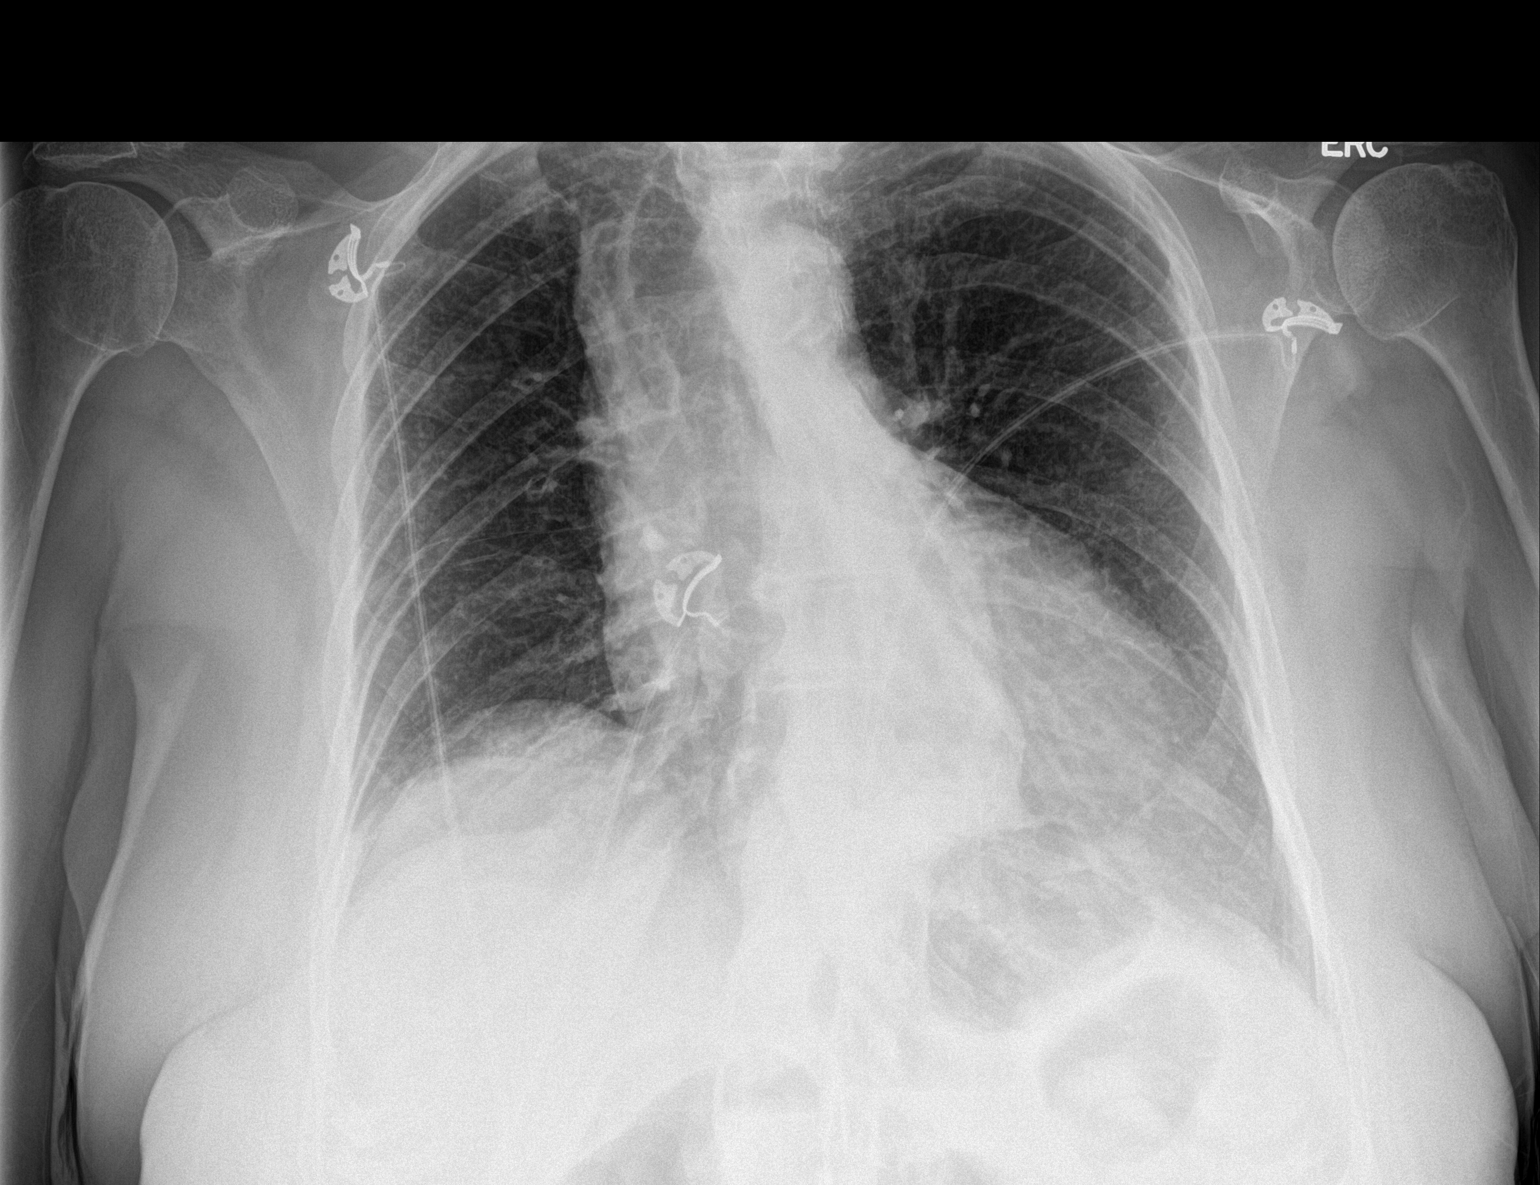
[im 2/2]
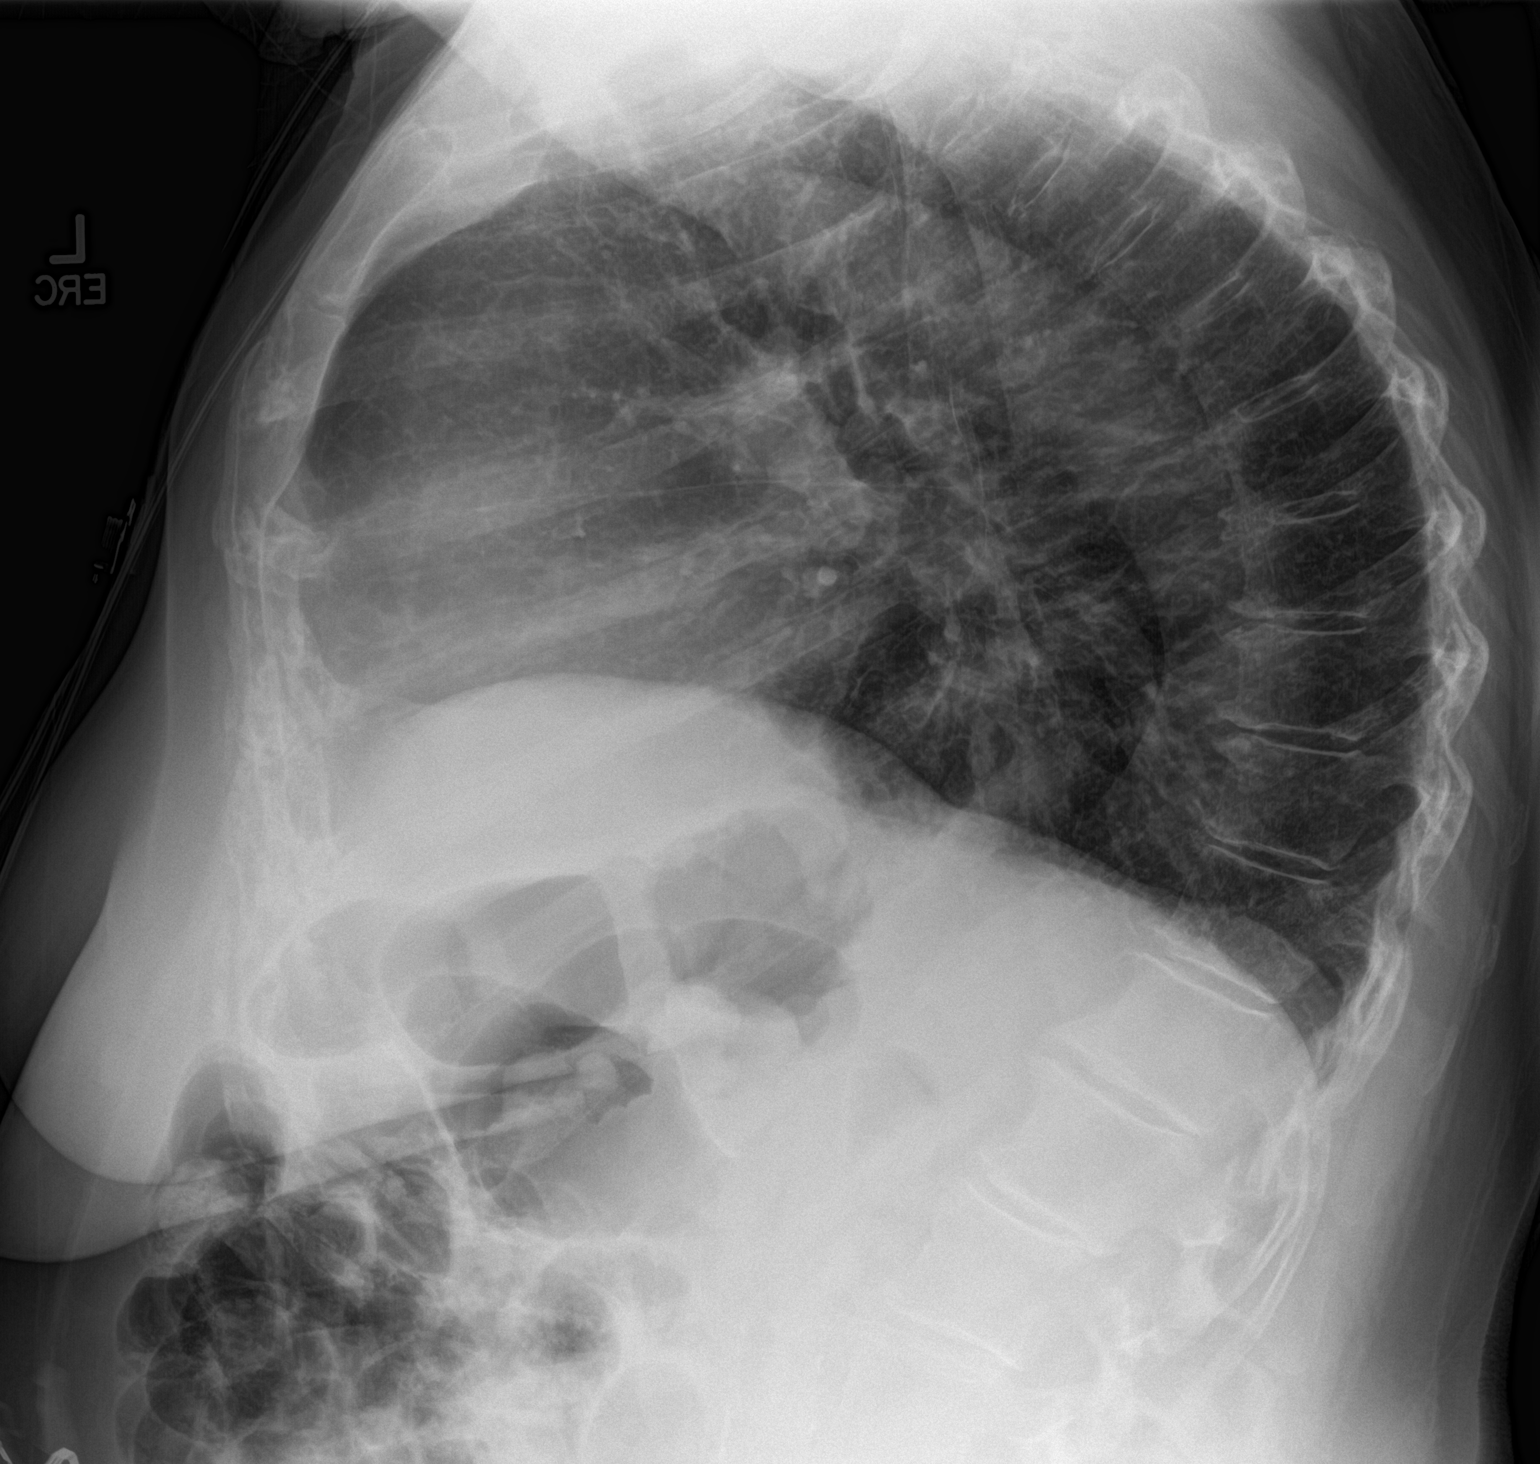

[2 of 2 positions shown; findings below may reference images not displayed]

FINDINGS: Stable cardiac silhouette with ectatic aorta. No effusion,
infiltrate pneumothorax. Kyphosis of the spine.
IMPRESSION: No acute cardiopulmonary process.

## 2018-06-02 DIAGNOSIS — R350 Frequency of micturition: Secondary | ICD-10-CM | POA: Diagnosis not present

## 2018-06-02 DIAGNOSIS — M1711 Unilateral primary osteoarthritis, right knee: Secondary | ICD-10-CM | POA: Diagnosis not present

## 2018-06-02 DIAGNOSIS — J209 Acute bronchitis, unspecified: Secondary | ICD-10-CM | POA: Diagnosis not present

## 2018-06-02 DIAGNOSIS — Z87448 Personal history of other diseases of urinary system: Secondary | ICD-10-CM | POA: Diagnosis not present

## 2018-06-14 IMAGING — CT CT ANGIO CHEST
1 of 6 series · 18 of 36 positions shown · IV contrast (APPLIED)
Comparison: CT chest 09/11/2016.

CLINICAL DATA: 86-year-old presenting with acute onset of
left-sided chest pain earlier today. Current history of influenza
with worsening cough. Patient also has hypoxemia.

EXAM:
CT ANGIOGRAPHY CHEST WITH CONTRAST
TECHNIQUE: Multidetector CT imaging of the chest was performed using the
standard protocol during bolus administration of intravenous
contrast. Multiplanar CT image reconstructions and MIPs were
obtained to evaluate the vascular anatomy.
CONTRAST:  75 mL Isovue 370 IV.

[Series 5: thins · axial · 0.65mm/px · z∈[-363,-123]mm · 18 of 268 slices shown]
[im 14/268  lung]
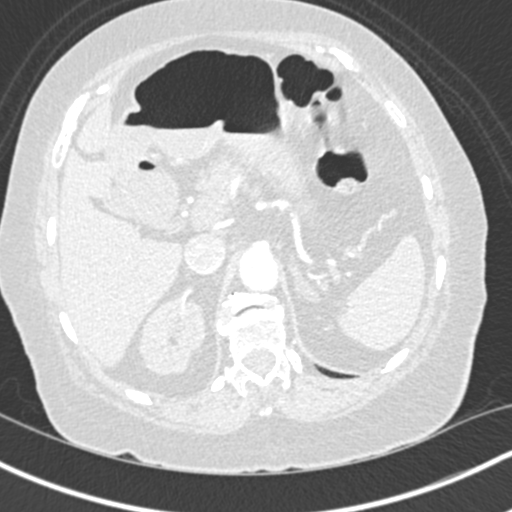
[im 27/268  mediastinal]
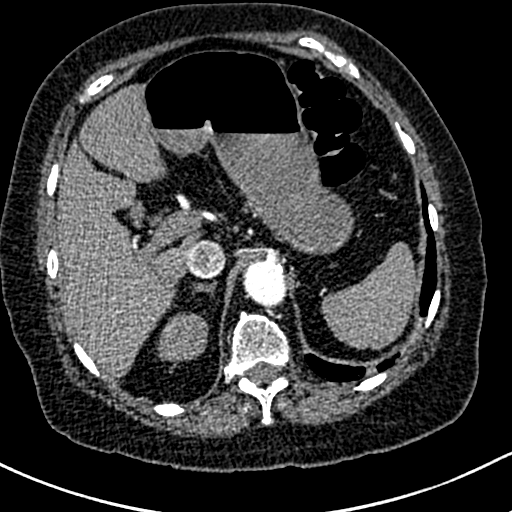
[im 41/268  lung]
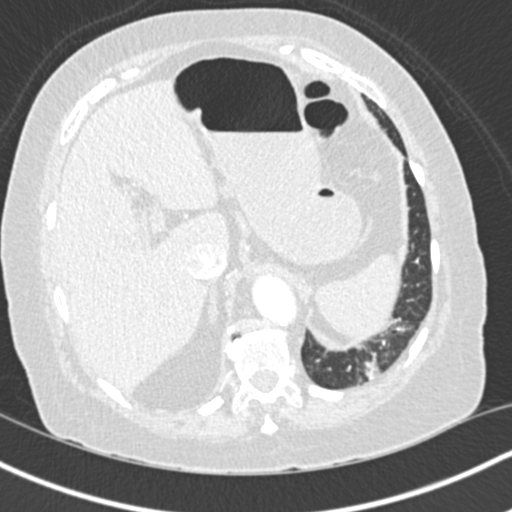
[im 54/268  mediastinal]
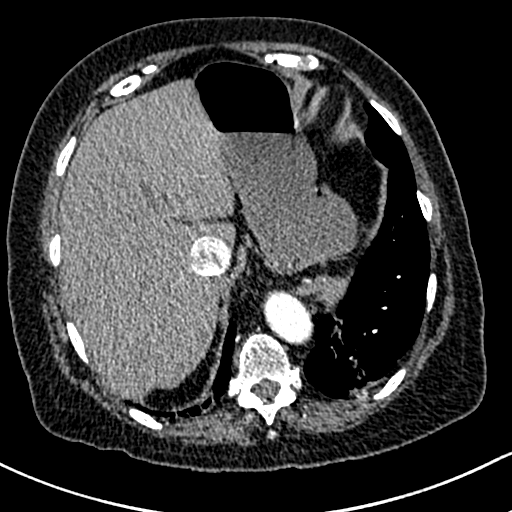
[im 67/268  lung]
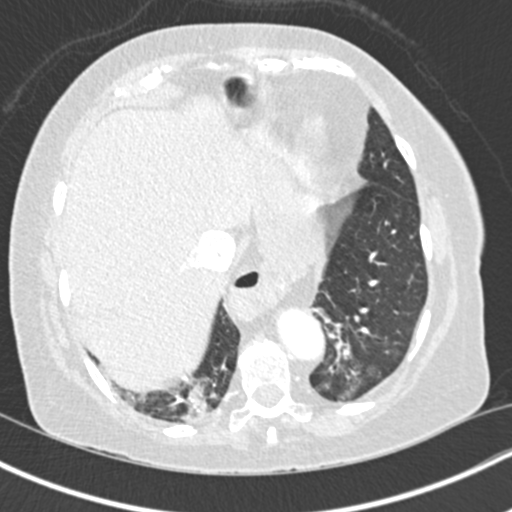
[im 81/268  mediastinal]
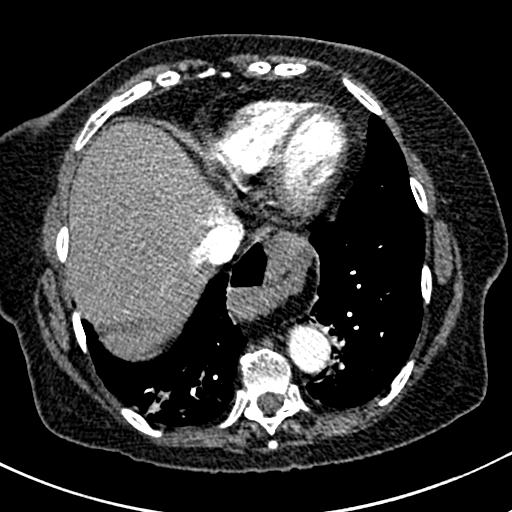
[im 94/268  lung]
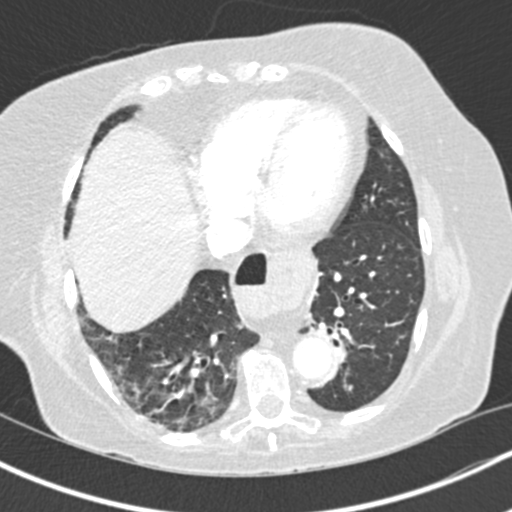
[im 107/268  mediastinal]
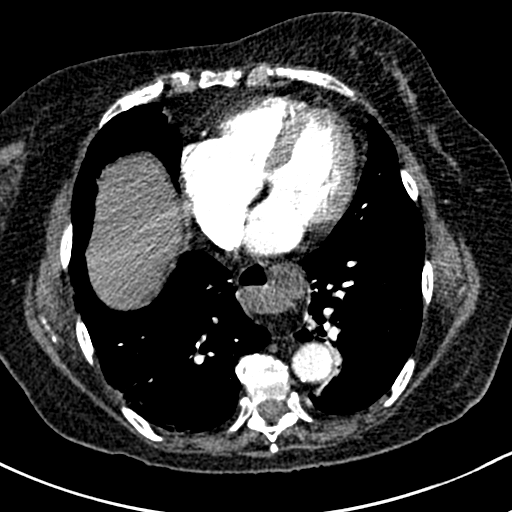
[im 121/268  lung]
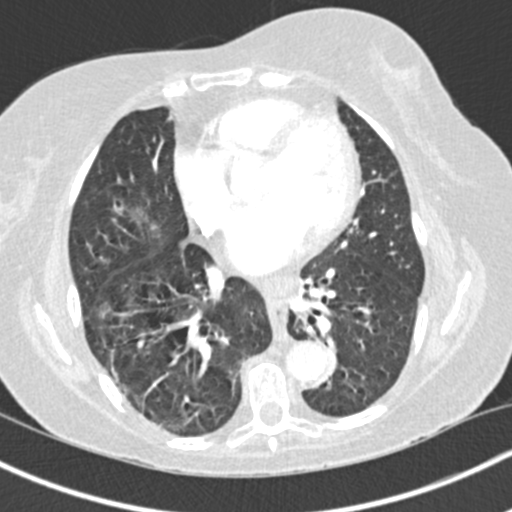
[im 147/268  mediastinal]
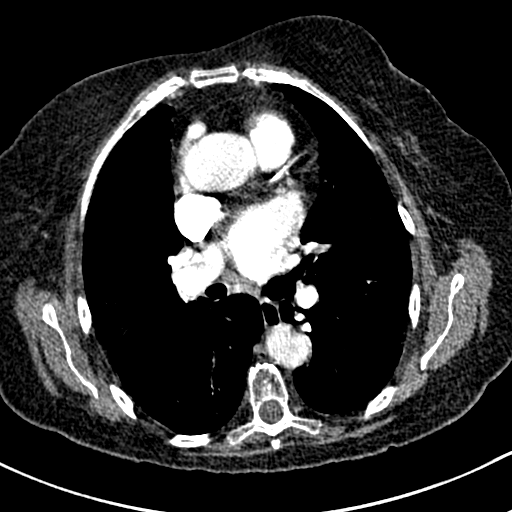
[im 161/268  lung]
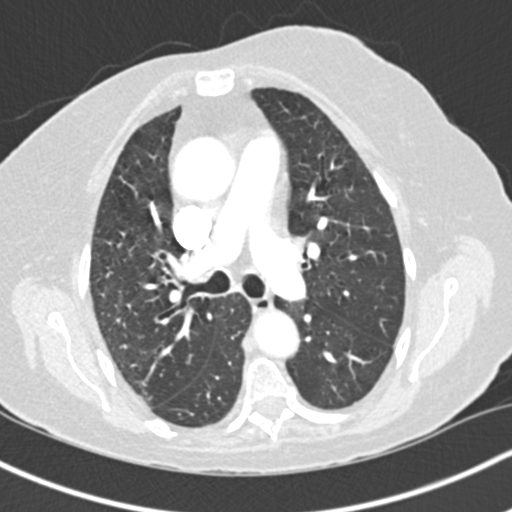
[im 174/268  mediastinal]
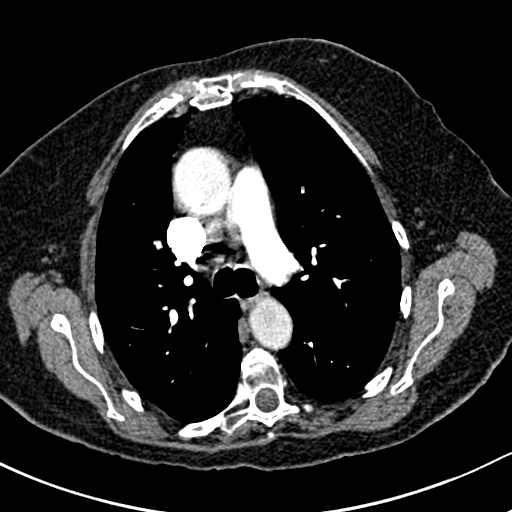
[im 187/268  lung]
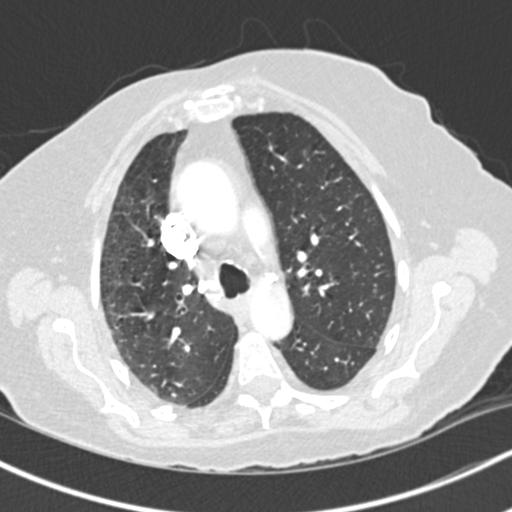
[im 201/268  mediastinal]
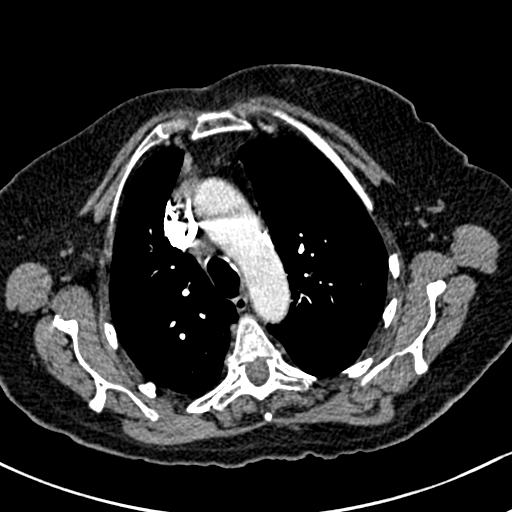
[im 214/268  lung]
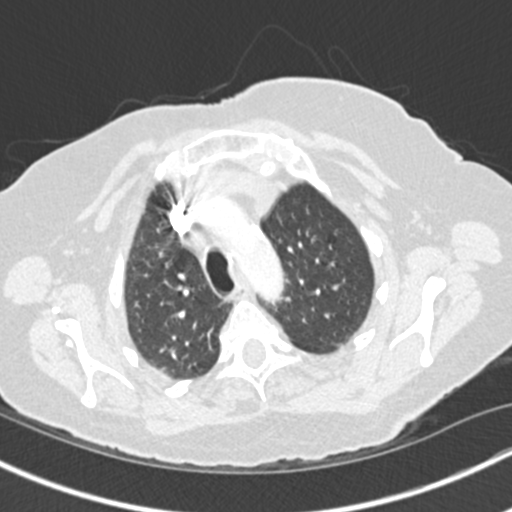
[im 227/268  mediastinal]
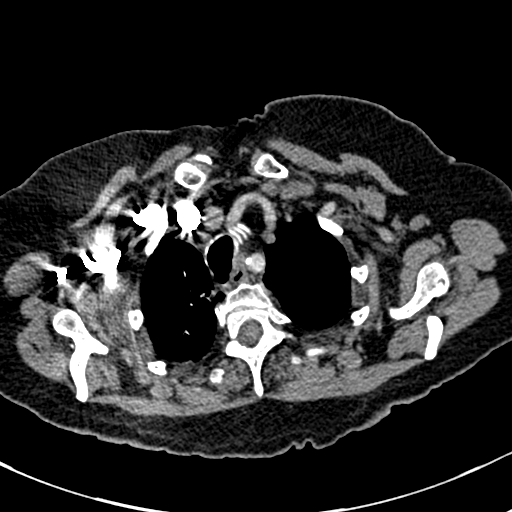
[im 241/268  lung]
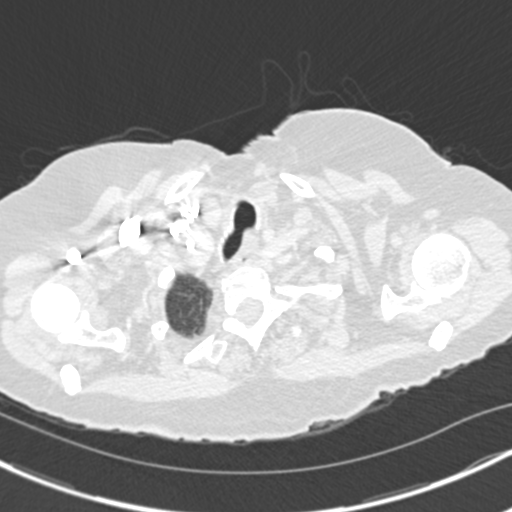
[im 254/268  mediastinal]
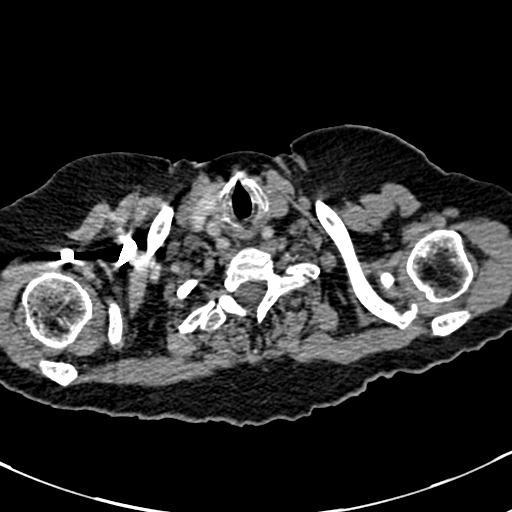

[18 of 36 positions shown; findings below may reference images not displayed]

FINDINGS: Respiratory motion blurred many of the images. The study is of less
than average technical quality.

Cardiovascular: Contrast opacification of pulmonary arteries is
good. However, the respiratory motion makes evaluation of
subsegmental arteries difficult. No filling defects within the main
pulmonary arteries or their central branches in either lung to
suggest pulmonary embolism.

Cardiac silhouette mildly enlarged with biatrial enlargement. Reflux
of contrast into the IVC and hepatic veins. No pericardial effusion.
Moderate LAD coronary atherosclerosis. Mild to moderate
atherosclerosis involving the thoracic and upper abdominal aorta
without evidence of aneurysm or dissection.

Mediastinum/Nodes: No pathologic lymphadenopathy. Moderate-sized
hiatal hernia. Normal appearing esophagus. Thyroid gland atrophic.

Lungs/Pleura: Patchy airspace opacities peripherally in the right
upper lobe and right lower lobe. Scar and bronchiectasis involving
the medial lower lobes bilaterally. No confluent airspace
consolidation. No pulmonary parenchymal nodules or masses. No
pleural effusions.

Upper Abdomen: Hiatal hernia as noted above. Upper abdomen otherwise
normal in appearance for the early portal venous phase of
enhancement.

Musculoskeletal: DISH involving the mid thoracic spine. Osseous
demineralization. Degenerative disc disease and spondylosis
throughout the thoracic spine and upper lumbar spine. Exaggeration
of the usual thoracic kyphosis. No acute abnormalities.

Review of the MIP images confirms the above findings.
IMPRESSION: 1. No evidence central pulmonary embolism.
2. Patchy pneumonia involving the right upper lobe and right lower
lobe.
3. Mild cardiomegaly with biatrial enlargement. Reflux of contrast
into the IVC and hepatic veins likely indicates right heart disease.
4. Moderate-sized hiatal hernia.
5. Mild to moderate atherosclerosis involving the thoracic and upper
abdominal aorta without evidence of aneurysm.

## 2018-06-20 DIAGNOSIS — G4733 Obstructive sleep apnea (adult) (pediatric): Secondary | ICD-10-CM | POA: Diagnosis not present

## 2018-06-30 DIAGNOSIS — S7002XA Contusion of left hip, initial encounter: Secondary | ICD-10-CM | POA: Diagnosis not present

## 2018-08-09 DIAGNOSIS — H109 Unspecified conjunctivitis: Secondary | ICD-10-CM | POA: Diagnosis not present

## 2018-08-09 DIAGNOSIS — H6692 Otitis media, unspecified, left ear: Secondary | ICD-10-CM | POA: Diagnosis not present

## 2018-08-15 DIAGNOSIS — H6692 Otitis media, unspecified, left ear: Secondary | ICD-10-CM | POA: Diagnosis not present

## 2018-08-15 DIAGNOSIS — H109 Unspecified conjunctivitis: Secondary | ICD-10-CM | POA: Diagnosis not present

## 2018-08-21 DIAGNOSIS — H00014 Hordeolum externum left upper eyelid: Secondary | ICD-10-CM | POA: Diagnosis not present

## 2018-09-01 DIAGNOSIS — J219 Acute bronchiolitis, unspecified: Secondary | ICD-10-CM | POA: Diagnosis not present

## 2018-09-01 DIAGNOSIS — F5102 Adjustment insomnia: Secondary | ICD-10-CM | POA: Diagnosis not present

## 2018-09-01 DIAGNOSIS — G450 Vertebro-basilar artery syndrome: Secondary | ICD-10-CM | POA: Diagnosis not present

## 2018-09-01 DIAGNOSIS — G451 Carotid artery syndrome (hemispheric): Secondary | ICD-10-CM | POA: Diagnosis not present

## 2018-09-04 DIAGNOSIS — M7062 Trochanteric bursitis, left hip: Secondary | ICD-10-CM | POA: Diagnosis not present

## 2018-09-04 DIAGNOSIS — H00014 Hordeolum externum left upper eyelid: Secondary | ICD-10-CM | POA: Diagnosis not present

## 2018-09-10 DIAGNOSIS — M170 Bilateral primary osteoarthritis of knee: Secondary | ICD-10-CM | POA: Diagnosis not present

## 2018-09-19 DIAGNOSIS — Z87448 Personal history of other diseases of urinary system: Secondary | ICD-10-CM | POA: Diagnosis not present

## 2018-09-19 DIAGNOSIS — M1711 Unilateral primary osteoarthritis, right knee: Secondary | ICD-10-CM | POA: Diagnosis not present

## 2018-09-19 DIAGNOSIS — J209 Acute bronchitis, unspecified: Secondary | ICD-10-CM | POA: Diagnosis not present

## 2018-09-28 ENCOUNTER — Emergency Department: Payer: Medicare Other

## 2018-09-28 ENCOUNTER — Other Ambulatory Visit: Payer: Self-pay

## 2018-09-28 ENCOUNTER — Inpatient Hospital Stay
Admission: EM | Admit: 2018-09-28 | Discharge: 2018-10-01 | DRG: 390 | Disposition: A | Discharge status: Home Health | Payer: Medicare Other | Attending: Internal Medicine | Admitting: Internal Medicine

## 2018-09-28 ENCOUNTER — Encounter: Payer: Self-pay | Admitting: Emergency Medicine

## 2018-09-28 DIAGNOSIS — Z79890 Hormone replacement therapy (postmenopausal): Secondary | ICD-10-CM | POA: Diagnosis not present

## 2018-09-28 DIAGNOSIS — N3281 Overactive bladder: Secondary | ICD-10-CM | POA: Diagnosis present

## 2018-09-28 DIAGNOSIS — I700 Atherosclerosis of aorta: Secondary | ICD-10-CM | POA: Diagnosis present

## 2018-09-28 DIAGNOSIS — K449 Diaphragmatic hernia without obstruction or gangrene: Secondary | ICD-10-CM | POA: Diagnosis present

## 2018-09-28 DIAGNOSIS — R195 Other fecal abnormalities: Secondary | ICD-10-CM | POA: Diagnosis not present

## 2018-09-28 DIAGNOSIS — Z66 Do not resuscitate: Secondary | ICD-10-CM | POA: Diagnosis present

## 2018-09-28 DIAGNOSIS — F329 Major depressive disorder, single episode, unspecified: Secondary | ICD-10-CM | POA: Diagnosis present

## 2018-09-28 DIAGNOSIS — J449 Chronic obstructive pulmonary disease, unspecified: Secondary | ICD-10-CM | POA: Diagnosis present

## 2018-09-28 DIAGNOSIS — N39498 Other specified urinary incontinence: Secondary | ICD-10-CM | POA: Diagnosis present

## 2018-09-28 DIAGNOSIS — K56609 Unspecified intestinal obstruction, unspecified as to partial versus complete obstruction: Secondary | ICD-10-CM | POA: Diagnosis present

## 2018-09-28 DIAGNOSIS — K219 Gastro-esophageal reflux disease without esophagitis: Secondary | ICD-10-CM | POA: Diagnosis present

## 2018-09-28 DIAGNOSIS — Z791 Long term (current) use of non-steroidal anti-inflammatories (NSAID): Secondary | ICD-10-CM | POA: Diagnosis not present

## 2018-09-28 DIAGNOSIS — G4700 Insomnia, unspecified: Secondary | ICD-10-CM | POA: Diagnosis present

## 2018-09-28 DIAGNOSIS — E860 Dehydration: Secondary | ICD-10-CM | POA: Diagnosis present

## 2018-09-28 DIAGNOSIS — J209 Acute bronchitis, unspecified: Secondary | ICD-10-CM

## 2018-09-28 DIAGNOSIS — E039 Hypothyroidism, unspecified: Secondary | ICD-10-CM | POA: Diagnosis present

## 2018-09-28 DIAGNOSIS — E785 Hyperlipidemia, unspecified: Secondary | ICD-10-CM | POA: Diagnosis present

## 2018-09-28 DIAGNOSIS — Z7902 Long term (current) use of antithrombotics/antiplatelets: Secondary | ICD-10-CM | POA: Diagnosis not present

## 2018-09-28 DIAGNOSIS — I2510 Atherosclerotic heart disease of native coronary artery without angina pectoris: Secondary | ICD-10-CM | POA: Diagnosis present

## 2018-09-28 DIAGNOSIS — R0602 Shortness of breath: Secondary | ICD-10-CM | POA: Diagnosis not present

## 2018-09-28 DIAGNOSIS — Z79899 Other long term (current) drug therapy: Secondary | ICD-10-CM | POA: Diagnosis not present

## 2018-09-28 DIAGNOSIS — Z8049 Family history of malignant neoplasm of other genital organs: Secondary | ICD-10-CM

## 2018-09-28 DIAGNOSIS — K56600 Partial intestinal obstruction, unspecified as to cause: Secondary | ICD-10-CM | POA: Diagnosis not present

## 2018-09-28 DIAGNOSIS — N993 Prolapse of vaginal vault after hysterectomy: Secondary | ICD-10-CM | POA: Diagnosis present

## 2018-09-28 DIAGNOSIS — K5730 Diverticulosis of large intestine without perforation or abscess without bleeding: Secondary | ICD-10-CM | POA: Diagnosis not present

## 2018-09-28 DIAGNOSIS — R197 Diarrhea, unspecified: Secondary | ICD-10-CM | POA: Diagnosis not present

## 2018-09-28 DIAGNOSIS — R05 Cough: Secondary | ICD-10-CM | POA: Diagnosis not present

## 2018-09-28 LAB — CBC WITH DIFFERENTIAL/PLATELET
Abs Immature Granulocytes: 0.05 10*3/uL (ref 0.00–0.07)
Basophils Absolute: 0 10*3/uL (ref 0.0–0.1)
Basophils Relative: 0 %
Eosinophils Absolute: 0.2 10*3/uL (ref 0.0–0.5)
Eosinophils Relative: 2 %
HCT: 43.9 % (ref 36.0–46.0)
Hemoglobin: 14.2 g/dL (ref 12.0–15.0)
Immature Granulocytes: 1 %
Lymphocytes Relative: 6 %
Lymphs Abs: 0.6 10*3/uL — ABNORMAL LOW (ref 0.7–4.0)
MCH: 29.6 pg (ref 26.0–34.0)
MCHC: 32.3 g/dL (ref 30.0–36.0)
MCV: 91.5 fL (ref 80.0–100.0)
Monocytes Absolute: 0.5 10*3/uL (ref 0.1–1.0)
Monocytes Relative: 5 %
Neutro Abs: 8.9 10*3/uL — ABNORMAL HIGH (ref 1.7–7.7)
Neutrophils Relative %: 86 %
Platelets: 200 10*3/uL (ref 150–400)
RBC: 4.8 MIL/uL (ref 3.87–5.11)
RDW: 13.9 % (ref 11.5–15.5)
WBC: 10.3 10*3/uL (ref 4.0–10.5)
nRBC: 0 % (ref 0.0–0.2)

## 2018-09-28 LAB — LIPASE, BLOOD: Lipase: 28 U/L (ref 11–51)

## 2018-09-28 LAB — COMPREHENSIVE METABOLIC PANEL
ALT: 17 U/L (ref 0–44)
AST: 26 U/L (ref 15–41)
Albumin: 3.8 g/dL (ref 3.5–5.0)
Alkaline Phosphatase: 57 U/L (ref 38–126)
Anion gap: 8 (ref 5–15)
BUN: 34 mg/dL — ABNORMAL HIGH (ref 8–23)
CO2: 25 mmol/L (ref 22–32)
Calcium: 8.8 mg/dL — ABNORMAL LOW (ref 8.9–10.3)
Chloride: 104 mmol/L (ref 98–111)
Creatinine, Ser: 0.69 mg/dL (ref 0.44–1.00)
GFR calc Af Amer: >60 mL/min (ref >60)
GFR calc non Af Amer: >60 mL/min (ref >60)
Glucose, Bld: 162 mg/dL — ABNORMAL HIGH (ref 70–99)
Potassium: 3.8 mmol/L (ref 3.5–5.1)
Sodium: 137 mmol/L (ref 135–145)
Total Bilirubin: 1.2 mg/dL (ref 0.3–1.2)
Total Protein: 6.9 g/dL (ref 6.5–8.1)

## 2018-09-28 MED ORDER — CLOPIDOGREL BISULFATE 75 MG PO TABS
75.0000 mg | ORAL_TABLET | Freq: Every day | ORAL | Status: DC
  Administered 2018-09-28 – 2018-10-01 (×4): 75 mg via ORAL
  Filled 2018-09-28 (×4): qty 1

## 2018-09-28 MED ORDER — SODIUM CHLORIDE 0.9 % IV BOLUS
500.0000 mL | Freq: Once | INTRAVENOUS | Status: AC
  Administered 2018-09-28: 500 mL via INTRAVENOUS

## 2018-09-28 MED ORDER — DIATRIZOATE MEGLUMINE & SODIUM 66-10 % PO SOLN: 30.0000 mL | Freq: Once | ORAL | Status: DC

## 2018-09-28 MED ORDER — ZOLPIDEM TARTRATE 5 MG PO TABS
5.0000 mg | ORAL_TABLET | Freq: Every evening | ORAL | Status: DC | PRN
  Administered 2018-09-29 – 2018-09-30 (×2): 5 mg via ORAL
  Filled 2018-09-28 (×2): qty 1

## 2018-09-28 MED ORDER — LEVOTHYROXINE SODIUM 50 MCG PO TABS
125.0000 ug | ORAL_TABLET | Freq: Every day | ORAL | Status: DC
  Administered 2018-09-29 – 2018-10-01 (×3): 125 ug via ORAL
  Filled 2018-09-28 (×3): qty 1

## 2018-09-28 MED ORDER — POTASSIUM CHLORIDE IN NACL 20-0.9 MEQ/L-% IV SOLN
INTRAVENOUS | Status: DC
  Administered 2018-09-28 – 2018-10-01 (×6): via INTRAVENOUS
  Filled 2018-09-28 (×7): qty 1000

## 2018-09-28 MED ORDER — ASPIRIN 81 MG PO CHEW
81.0000 mg | CHEWABLE_TABLET | Freq: Every day | ORAL | Status: DC
  Administered 2018-09-28 – 2018-10-01 (×4): 81 mg via ORAL
  Filled 2018-09-28 (×4): qty 1

## 2018-09-28 MED ORDER — ACETAMINOPHEN 325 MG PO TABS
650.0000 mg | ORAL_TABLET | Freq: Four times a day (QID) | ORAL | Status: DC | PRN
  Administered 2018-09-28: 650 mg via ORAL
  Filled 2018-09-28: qty 2

## 2018-09-28 MED ORDER — ENOXAPARIN SODIUM 40 MG/0.4ML ~~LOC~~ SOLN
40.0000 mg | SUBCUTANEOUS | Status: DC
  Administered 2018-09-28 – 2018-09-30 (×4): 40 mg via SUBCUTANEOUS
  Filled 2018-09-28 (×4): qty 0.4

## 2018-09-28 MED ORDER — HYDROCODONE-ACETAMINOPHEN 5-325 MG PO TABS: 1.0000 | ORAL_TABLET | ORAL | Status: DC | PRN

## 2018-09-28 MED ORDER — ONDANSETRON HCL 4 MG PO TABS: 4.0000 mg | ORAL_TABLET | Freq: Four times a day (QID) | ORAL | Status: DC | PRN

## 2018-09-28 MED ORDER — ALBUTEROL SULFATE (2.5 MG/3ML) 0.083% IN NEBU
2.5000 mg | INHALATION_SOLUTION | RESPIRATORY_TRACT | Status: DC | PRN
  Administered 2018-09-30: 2.5 mg via RESPIRATORY_TRACT
  Filled 2018-09-28: qty 3

## 2018-09-28 MED ORDER — IOPAMIDOL (ISOVUE-300) INJECTION 61%
30.0000 mL | Freq: Once | INTRAVENOUS | Status: AC | PRN
  Administered 2018-09-28: 30 mL via ORAL

## 2018-09-28 MED ORDER — PANTOPRAZOLE SODIUM 40 MG PO TBEC
40.0000 mg | DELAYED_RELEASE_TABLET | Freq: Every day | ORAL | Status: DC
  Administered 2018-09-28 – 2018-10-01 (×4): 40 mg via ORAL
  Filled 2018-09-28 (×4): qty 1

## 2018-09-28 MED ORDER — IOHEXOL 300 MG/ML  SOLN
100.0000 mL | Freq: Once | INTRAMUSCULAR | Status: AC | PRN
  Administered 2018-09-28: 100 mL via INTRAVENOUS

## 2018-09-28 MED ORDER — ONDANSETRON HCL 4 MG/2ML IJ SOLN: 4.0000 mg | Freq: Four times a day (QID) | INTRAMUSCULAR | Status: DC | PRN

## 2018-09-28 MED ORDER — ACETAMINOPHEN 650 MG RE SUPP: 650.0000 mg | Freq: Four times a day (QID) | RECTAL | Status: DC | PRN

## 2018-09-28 MED ORDER — ONDANSETRON HCL 4 MG/2ML IJ SOLN
4.0000 mg | Freq: Once | INTRAMUSCULAR | Status: AC
  Administered 2018-09-28: 4 mg via INTRAVENOUS
  Filled 2018-09-28: qty 2

## 2018-09-28 MED ORDER — GUAIFENESIN-DM 100-10 MG/5ML PO SYRP
5.0000 mL | ORAL_SOLUTION | ORAL | Status: DC | PRN
  Administered 2018-09-28 – 2018-09-30 (×4): 5 mL via ORAL
  Filled 2018-09-28 (×4): qty 5

## 2018-09-28 MED ORDER — PRAVASTATIN SODIUM 20 MG PO TABS
40.0000 mg | ORAL_TABLET | Freq: Every day | ORAL | Status: DC
  Administered 2018-09-28 – 2018-09-30 (×3): 40 mg via ORAL
  Filled 2018-09-28 (×3): qty 2

## 2018-09-28 NOTE — ED Notes (Signed)
Pt assisted to bathroom but unable to produce stool at this time.

## 2018-09-28 NOTE — ED Notes (Signed)
Pt assisted to restroom.  

## 2018-09-28 NOTE — Consult Note (Signed)
Date of Consultation:  09/28/2018  Requesting Physician: Delman Kitten, MD  Reason for Consultation: Partial small bowel obstruction  History of Present Illness: Abigail Strong is a 85 y.o. female presenting to the emergency room with a 1 day history of some nausea as well as diarrhea.  Patient reports that last night she started having some nausea earlier in the night but was unable to have any emesis.  And then this morning she had 3 episodes of large amount of diarrhea to the point that she was not able to make it to the toilet.  She denies any abdominal pain yesterday or today and currently denies any additional nausea except for some nausea that she had after drinking contrast which is not an uncommon reaction to the contrast itself.  Denies any fevers or chills, chest pain or shortness of breath, denies any constipation or abdominal pain, dysuria or hematuria.  She does report that she has an overactive bladder and does have urinary incontinence from that and wears a pad.  The patient reports that she has a history of a Nissen fundoplication for acid reflux which was followed by a small bowel obstruction episode that apparently required surgery about 7 to 8 years ago.  All I can find in her medical records on epic related to this history in 2008 so about more like 12 years ago.  Past Medical History: Past Medical History:  Diagnosis Date  . Depression   . GERD (gastroesophageal reflux disease)   . Hyperlipidemia   . Hypothyroidism   . Insomnia      Past Surgical History: Past Surgical History:  Procedure Laterality Date  . ABDOMINAL HYSTERECTOMY    . cataracts    . COLON SURGERY     partial colectomy  . intestinal blockage    . NISSEN FUNDOPLICATION      Home Medications: Prior to Admission medications   Medication Sig Start Date End Date Taking? Authorizing Provider  acetaminophen (TYLENOL) 500 MG tablet Take 1,000 mg by mouth every 6 (six) hours as needed.   Yes [provider]  albuterol (PROVENTIL) (2.5 MG/3ML) 0.083% nebulizer solution Take 3 mLs (2.5 mg total) by nebulization every 4 (four) hours as needed for wheezing or shortness of breath. 09/27/16  Yes Theodoro Grist, MD  aspirin 81 MG chewable tablet Chew 81 mg by mouth daily.   Yes [provider]  clopidogrel (PLAVIX) 75 MG tablet Take 75 mg by mouth daily.    Yes [provider]  furosemide (LASIX) 20 MG tablet Take 20 mg by mouth daily.   Yes [provider]  levothyroxine (SYNTHROID, LEVOTHROID) 125 MCG tablet Take 125 mcg by mouth daily before breakfast.   Yes [provider]  meloxicam (MOBIC) 7.5 MG tablet Take 7.5 mg by mouth daily.   Yes [provider]  omeprazole (PRILOSEC) 40 MG capsule Take 40 mg by mouth 2 (two) times daily.   Yes [provider]  pravastatin (PRAVACHOL) 40 MG tablet Take 40 mg by mouth at bedtime.   Yes [provider]  zolpidem (AMBIEN) 5 MG tablet Take 5 mg by mouth at bedtime as needed for sleep.   Yes [provider]    Allergies: Allergies  Allergen Reactions  . Sulfa Antibiotics Other (See Comments)  . Sulfonamide Derivatives     Social History:  reports that she has never smoked. She has never used smokeless tobacco. She reports that she does not drink alcohol or use drugs.  Family History: Family History  Problem Relation Age of Onset  . Cervical cancer Mother     Review of Systems: Review of Systems  Constitutional: Negative for chills and fever.  HENT: Negative for hearing loss.   Respiratory: Negative for shortness of breath.   Cardiovascular: Negative for chest pain.  Gastrointestinal: Positive for diarrhea and nausea. Negative for abdominal pain, constipation and vomiting.  Genitourinary: Negative for dysuria.  Musculoskeletal: Negative for myalgias.  Skin: Negative for rash.  Neurological: Negative for dizziness.  Psychiatric/Behavioral: Negative for depression.     Physical Exam BP 99/62   Pulse 89   Temp 98 F (36.7 C) (Oral)   Resp 18   Ht 5\' 5"  (1.651 m)   Wt 77.1 kg   SpO2 98%   BMI 28.29 kg/m  CONSTITUTIONAL: No acute distress HEENT:  Normocephalic, atraumatic, extraocular motion intact. NECK: Trachea is midline, and there is no jugular venous distension. RESPIRATORY:  Lungs are clear, and breath sounds are equal bilaterally. Normal respiratory effort without pathologic use of accessory muscles. CARDIOVASCULAR: Heart is regular without murmurs, gallops, or rubs. GI: The abdomen is soft, nondistended, nontender to palpation. There were no palpable masses.  Patient does have a reducible umbilical hernia with no tenderness to palpation on that.   MUSCULOSKELETAL:  Normal muscle strength and tone in all four extremities.  No peripheral edema or cyanosis. SKIN: Skin turgor is normal. There are no pathologic skin lesions.  NEUROLOGIC:  Motor and sensation is grossly normal.  Cranial nerves are grossly intact. PSYCH:  Alert and oriented to person, place and time. Affect is normal.  Laboratory Analysis: Results for orders placed or performed during the hospital encounter of 09/28/18 (from the past 24 hour(s))  CBC with Differential     Status: Abnormal   Collection Time: 09/28/18  9:46 AM  Result Value Ref Range   WBC 10.3 4.0 - 10.5 K/uL   RBC 4.80 3.87 - 5.11 MIL/uL   Hemoglobin 14.2 12.0 - 15.0 g/dL   HCT 43.9 36.0 - 46.0 %   MCV 91.5 80.0 - 100.0 fL   MCH 29.6 26.0 - 34.0 pg   MCHC 32.3 30.0 - 36.0 g/dL   RDW 13.9 11.5 - 15.5 %   Platelets 200 150 - 400 K/uL   nRBC 0.0 0.0 - 0.2 %   Neutrophils Relative % 86 %   Neutro Abs 8.9 (H) 1.7 - 7.7 K/uL   Lymphocytes Relative 6 %   Lymphs Abs 0.6 (L) 0.7 - 4.0 K/uL   Monocytes Relative 5 %   Monocytes Absolute 0.5 0.1 - 1.0 K/uL   Eosinophils Relative 2 %   Eosinophils Absolute 0.2 0.0 - 0.5 K/uL   Basophils Relative 0 %   Basophils Absolute 0.0 0.0 - 0.1 K/uL   Immature  Granulocytes 1 %   Abs Immature Granulocytes 0.05 0.00 - 0.07 K/uL  Comprehensive metabolic panel     Status: Abnormal   Collection Time: 09/28/18  9:46 AM  Result Value Ref Range   Sodium 137 135 - 145 mmol/L   Potassium 3.8 3.5 - 5.1 mmol/L   Chloride 104 98 - 111 mmol/L   CO2 25 22 - 32 mmol/L   Glucose, Bld 162 (H) 70 - 99 mg/dL   BUN 34 (H) 8 - 23 mg/dL   Creatinine, Ser 0.69 0.44 - 1.00 mg/dL   Calcium 8.8 (L) 8.9 - 10.3 mg/dL   Total Protein 6.9 6.5 - 8.1 g/dL   Albumin 3.8 3.5 -  5.0 g/dL   AST 26 15 - 41 U/L   ALT 17 0 - 44 U/L   Alkaline Phosphatase 57 38 - 126 U/L   Total Bilirubin 1.2 0.3 - 1.2 mg/dL   GFR calc non Af Amer >60 >60 mL/min   GFR calc Af Amer >60 >60 mL/min   Anion gap 8 5 - 15  Lipase, blood     Status: None   Collection Time: 09/28/18  9:46 AM  Result Value Ref Range   Lipase 28 11 - 51 U/L    Imaging: Dg Chest 2 View  Result Date: 09/28/2018 CLINICAL DATA:  85 year old female with diarrhea EXAM: CHEST - 2 VIEW COMPARISON:  03/28/2018 FINDINGS: Cardiomediastinal silhouette unchanged in size and contour. No pneumothorax or pleural effusion. Low lung volumes with coarsened interstitial markings. No confluent airspace disease. Accentuated thoracic kyphotic curvature. No displaced fracture. Double density at the hilar region persists. IMPRESSION: Chronic lung changes with low lung volumes and no evidence of acute cardiopulmonary disease. Hiatal hernia. Electronically Signed   By: Corrie Mckusick D.O.   On: 09/28/2018 10:56   Ct Abdomen Pelvis W Contrast  Result Date: 09/28/2018 CLINICAL DATA:  85 year old female with history of diarrhea. Productive cough and shortness of breath. EXAM: CT ABDOMEN AND PELVIS WITH CONTRAST TECHNIQUE: Multidetector CT imaging of the abdomen and pelvis was performed using the standard protocol following bolus administration of intravenous contrast. CONTRAST:  146mL OMNIPAQUE IOHEXOL 300 MG/ML SOLN, 65mL ISOVUE-300 IOPAMIDOL  (ISOVUE-300) INJECTION 61% COMPARISON:  CT the abdomen and pelvis 10/14/2016. FINDINGS: Lower chest: Large hiatal hernia. Thickening calcification of the aortic valve. Atherosclerotic calcifications in the left anterior descending and right coronary arteries. Hepatobiliary: No suspicious cystic or solid hepatic lesions. No intra or extrahepatic biliary ductal dilatation. Gallbladder is normal in appearance. Pancreas: No pancreatic mass. No pancreatic ductal dilatation. No pancreatic or peripancreatic fluid or inflammatory changes. Spleen: Unremarkable. Adrenals/Urinary Tract: 3.1 cm low-attenuation lesion in the interpolar region of the left kidney, compatible with a simple cyst. Mild multifocal cortical scarring in the right kidney. Bilateral adrenal glands are normal in appearance. No hydroureteronephrosis. Urinary bladder is nearly completely decompressed. Base of the urinary bladder lies well below (3.5 cm) the level of the pubococcygeal line at rest, indicative of a cystocele. Stomach/Bowel: Normal appearance of the intra-abdominal portion of the stomach. Several borderline and mildly dilated loops of small bowel are noted in the proximal small bowel measuring up to 3.6 cm in diameter. Transition to nondilated small bowel noted in the central abdomen on axial image 47 of series 2. More distal small bowel is otherwise relatively decompressed, with only minimal oral contrast beyond this point. As well, the colon is decompressed. The appendix is not confidently identified and may be surgically absent. Regardless, there are no inflammatory changes noted adjacent to the cecum to suggest the presence of an acute appendicitis at this time. Numerous colonic diverticulae are noted, without surrounding inflammatory changes to suggest an acute diverticulitis at this time. Vascular/Lymphatic: Aortic atherosclerosis, without evidence of aneurysm or dissection in the abdominal or pelvic vasculature. No lymphadenopathy noted  in the abdomen or pelvis. Reproductive: Status post hysterectomy. Ovaries are not confidently identified may be surgically absent or atrophic. Other: No significant volume of ascites.  No pneumoperitoneum. Musculoskeletal: There are no aggressive appearing lytic or blastic lesions noted in the visualized portions of the skeleton. IMPRESSION: 1. Findings are suggestive of partial small bowel obstruction with transition point in the mid small bowel, as detailed above.  2. Colonic diverticulosis without evidence of acute diverticulitis at this time. 3. There are calcifications of the aortic valve. Echocardiographic correlation for evaluation of potential valvular dysfunction may be warranted if clinically indicated. Large hiatal hernia. 4. Aortic atherosclerosis, in addition to 2 vessel coronary artery disease. 5. Cystocele. Electronically Signed   By: Vinnie Langton M.D.   On: 09/28/2018 13:13   Dg Abd 2 Views  Result Date: 09/28/2018 CLINICAL DATA:  Patient reports waking up during the with diarrhea. Patient states she has been unable to get to the restroom on time. Patient states she has been on 3 rounds of antibiotics over the last month for respiratory issues. EXAM: ABDOMEN - 2 VIEW COMPARISON:  03/06/2016 FINDINGS: There is dilatation of small bowel loops within the LEFT LOWER QUADRANT. Although there is gas within the colon, with colonic loops appear less distended. No evidence for organomegaly. Scattered phleboliths in the pelvis. IMPRESSION: Early or partial small bowel obstruction. Electronically Signed   By: Nolon Nations M.D.   On: 09/28/2018 10:57    Assessment and Plan: This is a 85 y.o. female with a possible show small bowel obstruction.  Discussed with the patient that her symptoms are not typical for a small bowel obstruction as she is having diarrhea and no abdominal pain.  Typically patients present with nausea vomiting and abdominal pain or discomfort bloatedness distention.  However  her main symptom is really diarrhea that brought her in today.  She currently is not nauseous and does not have any abdominal pain at all.  However given the findings of the CAT scan it is uncertain if this could be a very early small bowel obstruction episode versus possibly an ileus or gastroenteritis although she does not have any sick contacts around.  At this point however she may be admitted to the medical team.  I agree with stool studies that have been ordered by the ED.  At this point I do not see a particular need for an NG tube placement as the patient is not nauseous and not having any pain.  The patient is also very much against having the NG tube placed if possible.  However I did discuss with the patient that if this is indeed a bowel obstruction episode and she does get worse, she would need an NG tube placement.  She agrees to that and understands this.  Recommendations: -No NG tube that at this point. -Keep n.p.o. with IV fluid hydration. -Stool studies when she has a bowel movement.  Face-to-face time spent with the patient and care providers was 55 minutes, with more than 50% of the time spent counseling, educating, and coordinating care of the patient.     Melvyn Neth, MD [facility] Pg:  [phone]

## 2018-09-28 NOTE — ED Notes (Signed)
Son at bedside with patient

## 2018-09-28 NOTE — ED Provider Notes (Signed)
[facility] Emergency Department Provider Note   ____________________________________________   First MD Initiated Contact with Patient 09/28/18 0957     (approximate)  I have reviewed the triage vital signs and the nursing notes.   HISTORY  Chief Complaint Diarrhea and Shortness of Breath  HPI Abigail Strong is a 85 y.o. female here for evaluation of loose stools and diarrhea   Patient reports she got up this morning, had about 3 loose extremely watery bowel movements.  Reports that is unusual for her.  She felt very weak and fatigued afterwards.  She also reports she is recently had 3 different antibiotics over the last 2 weeks for respiratory issue including a persistent cough administered through her primary care doctor Dr. Rebecka Apley.  She does have a history of COPD, but denies feeling short of breath.  She continues to have her productive cough but this does not seem to be worsening.  She reports rather came for evaluation today because of the very loose watery bowel movements she is experiencing this morning.  No nausea or vomiting.  Reports bowel movement felt crampy when it started, but has no longer having any discomfort.  Does not feel she needs to have a bowel movement at the present time.  Denies any black or bloody stool.  Reports she feels fatigued, is been feeling very fatigued for a couple of weeks time.  Past Medical History:  Diagnosis Date  . Depression   . GERD (gastroesophageal reflux disease)   . Hyperlipidemia   . Hypothyroidism   . Insomnia     Patient Active Problem List   Diagnosis Date Noted  . Arthritis of knee 02/19/2017  . COPD exacerbation ([place]) 09/27/2016  . Pneumonia 09/27/2016  . Influenza A 09/27/2016  . Prediabetes 09/27/2016  . Leukocytosis 09/27/2016  . Chronic fatigue, unspecified 09/27/2016  . Musculoskeletal chest pain 09/27/2016  . Acute respiratory failure with hypoxia ([place]) 09/22/2016  . Bronchitis  09/09/2016  . Osteoarthritis of knee 03/23/2016  . Diverticulitis 03/23/2015  . Depression 01/01/2014  . GERD (gastroesophageal reflux disease) 01/01/2014  . Hyperlipidemia, unspecified 01/01/2014  . Hypothyroidism, unspecified 01/01/2014  . OA (osteoarthritis) 01/01/2014  . HYPERTENSION 12/08/2009  . COUGH 12/08/2009    Past Surgical History:  Procedure Laterality Date  . ABDOMINAL HYSTERECTOMY    . cataracts    . COLON SURGERY     partial colectomy  . intestinal blockage    . NISSEN FUNDOPLICATION      Prior to Admission medications   Medication Sig Start Date End Date Taking? Authorizing Provider  acetaminophen (TYLENOL) 500 MG tablet Take 1,000 mg by mouth every 6 (six) hours as needed.    [provider]  albuterol (PROVENTIL) (2.5 MG/3ML) 0.083% nebulizer solution Take 3 mLs (2.5 mg total) by nebulization every 4 (four) hours as needed for wheezing or shortness of breath. Patient not taking: Reported on 03/28/2018 09/27/16   Theodoro Grist, MD  albuterol (PROVENTIL) (2.5 MG/3ML) 0.083% nebulizer solution Take 3 mLs (2.5 mg total) by nebulization every 6 (six) hours as needed for wheezing or shortness of breath. Patient not taking: Reported on 03/28/2018 09/27/16   Theodoro Grist, MD  ALPRAZolam Duanne Moron) 0.25 MG tablet Take 0.25 mg by mouth at bedtime. 11/13/17   [provider]  clopidogrel (PLAVIX) 75 MG tablet Take 75 mg by mouth daily.     [provider]  furosemide (LASIX) 20 MG tablet Take 20 mg by mouth daily.    [provider]  levothyroxine (SYNTHROID, LEVOTHROID) 125 MCG tablet Take 125 mcg by mouth daily before breakfast.    [provider]  meclizine (ANTIVERT) 25 MG tablet Take 1 tablet (25 mg total) by mouth 3 (three) times daily as needed for dizziness or nausea. 03/28/18   Earleen Newport, MD  meloxicam (MOBIC) 7.5 MG tablet Take 7.5 mg by mouth daily.    [provider]  mirabegron ER (MYRBETRIQ) 25 MG TB24  tablet Take 1 tablet (25 mg total) by mouth daily. Patient not taking: Reported on 03/28/2018 11/19/17   Zara Council A, PA-C  omeprazole (PRILOSEC) 40 MG capsule Take 40 mg by mouth 2 (two) times daily.    [provider]    Allergies Sulfa antibiotics and Sulfonamide derivatives  Family History  Problem Relation Age of Onset  . Cervical cancer Mother     Social History Social History   Tobacco Use  . Smoking status: Never Smoker  . Smokeless tobacco: Never Used  Substance Use Topics  . Alcohol use: No  . Drug use: No    Review of Systems Constitutional: No fever/chills but feeling fatigued Eyes: No visual changes. ENT: No sore throat. Cardiovascular: Denies chest pain. Respiratory: Denies shortness of breath.  Been having a persistent cough for about 2 weeks has been on 2 antibiotics for this. Gastrointestinal: No abdominal pain presently, did have very loose bowel movements this morning.  See HPI.   Genitourinary: Negative for dysuria. Musculoskeletal: Negative for back pain. Skin: Negative for rash. Neurological: Negative for headaches, areas of focal weakness or numbness.    ____________________________________________   PHYSICAL EXAM:  VITAL SIGNS: ED Triage Vitals  Enc Vitals Group     BP 09/28/18 0922 (!) 110/59     Pulse Rate 09/28/18 0922 100     Resp 09/28/18 0922 16     Temp 09/28/18 0922 98 F (36.7 C)     Temp Source 09/28/18 0922 Oral     SpO2 09/28/18 0922 98 %     Weight 09/28/18 0926 170 lb (77.1 kg)     Height 09/28/18 0926 5\' 5"  (1.651 m)     Head Circumference --      Peak Flow --      Pain Score 09/28/18 0921 8     Pain Loc --      Pain Edu? --      Excl. in [place]? --     Constitutional: Alert and oriented.  Chronically ill-appearing but in no acute distress.  She is pleasant.  Son at the bedside. Eyes: Conjunctivae are normal. Head: Atraumatic. Nose: No congestion/rhinnorhea. Mouth/Throat: Mucous membranes are slightly  dry. Neck: No stridor.  Cardiovascular: Normal rate, regular rhythm. Grossly normal heart sounds.  Good peripheral circulation. Respiratory: Normal respiratory effort.  No retractions. Lungs CTAB.  Occasional dry nonproductive cough.  No oxygen requirement. Gastrointestinal: Soft and nontender. No distention. Musculoskeletal: No lower extremity tenderness nor edema. Neurologic:  Normal speech and language. No gross focal neurologic deficits are appreciated.  Skin:  Skin is warm, dry and intact. No rash noted. Psychiatric: Mood and affect are normal. Speech and behavior are normal.  ____________________________________________   LABS (all labs ordered are listed, but only abnormal results are displayed)  Labs Reviewed  CBC WITH DIFFERENTIAL/PLATELET - Abnormal; Notable for the following components:      Result Value   Neutro Abs 8.9 (*)    Lymphs Abs 0.6 (*)    All other components within normal limits  COMPREHENSIVE METABOLIC PANEL - Abnormal; Notable for the following components:   Glucose, Bld 162 (*)    BUN 34 (*)    Calcium 8.8 (*)    All other components within normal limits  GASTROINTESTINAL PANEL BY PCR, STOOL (REPLACES STOOL CULTURE)  C DIFFICILE QUICK SCREEN W PCR REFLEX  LIPASE, BLOOD   ____________________________________________  EKG  Reviewed and interpreted by me at 930 Heart rate 110 QRS 90 QTc 430 Normal sinus rhythm, occasional PVC.  No evidence of acute cardiac ischemia  ____________________________________________  IMPRESSION: 1. Findings are suggestive of partial small bowel obstruction with transition point in the mid small bowel, as detailed above. 2. Colonic diverticulosis without evidence of acute diverticulitis at this time. 3. There are calcifications of the aortic valve. Echocardiographic correlation for evaluation of potential valvular dysfunction may be warranted if clinically indicated. Large hiatal hernia. 4. Aortic atherosclerosis,  in addition to 2 vessel coronary artery disease. 5. Cystocele.  CT results discussed with Dr. Hampton Abbot ____________________________________________   PROCEDURES  Procedure(s) performed: None  Procedures  Critical Care performed: No  ____________________________________________   INITIAL IMPRESSION / ASSESSMENT AND PLAN / ED COURSE  Pertinent labs & imaging results that were available during my care of the patient were reviewed by me and considered in my medical decision making (see chart for details).   Patient presents for evaluation of loose stools crampiness this morning.  Symptoms seem to be improved now, but does have a previous history of bowel obstruction and recently been on multiple antibiotics for respiratory disease.  Overall her respiratory status seems well, no hypoxia, normal work of breathing, clear lung sounds but ongoing cough.  Chest x-ray reassuring no evidence of ongoing or issue of pneumonia.  Does not have body aches, high fever, or other symptoms of suggest influenza.  Regarding her abdominal symptoms which is the reason for presentation today, she has had some loose watery stools and given her recent antibiotic use I would like to check stool studies and make sure she has not developed C. difficile.  Currently pain-free, but does also have a history of obstruction and her abdominal imaging cystoscopy does not suggest need for further CT.  Will obtain CT abdomen pelvis to further evaluate and assure no evidence of obstruction or acute bacterial infection, colitis etc. by imaging.  Clinical Course as of Sep 29 1355  Sun Sep 28, 2018  1352 Case and care discussed with Dr. Hampton Abbot. Advises presently will have surgery consult, await stool cultures, and admit to medicine. No NG at this time, no vomiting, no ongoing nausea.    [MQ]    Clinical Course User Index [MQ] Delman Kitten, MD   CT imaging concerning for partial small bowel obstruction, could explain the loose  stool that she experienced and no further stooling.  Continue to feel C. difficile and infectious etiologies need exclusion.  Discussed with surgery, will admit to medical service and surgical consultation.  Case and care also discussed with Dr. Bridgett Larsson.  Patient and family agreeable with plan.  Patient currently not having ongoing complaint at this time, though I do feel she needs observation and consultation with surgery as well as admission for concerns of possible early SBO.  ____________________________________________   FINAL CLINICAL IMPRESSION(S) / ED DIAGNOSES  Final diagnoses:  Acute bronchitis, unspecified organism  Small bowel obstruction, partial ([place])  Loose stools        Note:  This document was prepared using Dragon voice recognition software and may include unintentional dictation  errors     ----------------------------------------- 11:16 AM on 09/28/2018 -----------------------------------------  Of note, have attempted over the last hour to get a hold of Dr. Rebecka Apley the primary care who patient reports placed him on recent antibiotics and treatment.  Unfortunately do not have record, and we were unable to reach him via his clinic on this Sunday     Delman Kitten, MD 09/28/18 1358

## 2018-09-28 NOTE — ED Notes (Signed)
Pt assisted to restroom and brief changed.

## 2018-09-28 NOTE — ED Notes (Signed)
Admitting MD at bedside.

## 2018-09-28 NOTE — ED Notes (Signed)
Pt updated about waiting on bed. Pt in no pain at this time. Pt resting comfortable with VSS.

## 2018-09-28 NOTE — H&P (Signed)
[employer] [facility] [place] at [facility]   PATIENT NAME: Abigail Strong    MR#:  [id number]  DATE OF BIRTH:  04/13/1930  DATE OF ADMISSION:  09/28/2018  PRIMARY CARE PHYSICIAN: Corky Downs, MD   REQUESTING/REFERRING PHYSICIAN: Dr. Fanny Bien.  CHIEF COMPLAINT:   Chief Complaint  Patient presents with  . Diarrhea  . Shortness of Breath   Diarrhea since yesterday HISTORY OF PRESENT ILLNESS:  Abigail Strong  is a 85 y.o. female with a known history of hyperlipidemia, hypothyroidism, depression and GERD.  The patient presents the ED with above chief complaints.  The patient has had a cough for 1 months.  She has been treated with 3 different antibiotics but still has cough.  She developed diarrhea with loose stool multiple times since yesterday.  She has no complaints of abdominal pain or vomiting but has nausea.  No melena or bloody stool.  CAT scan of the abdomen report partial small bowel obstruction.  Dr. Fanny Bien discussed with on-call surgeon and requested admission. PAST MEDICAL HISTORY:   Past Medical History:  Diagnosis Date  . Depression   . GERD (gastroesophageal reflux disease)   . Hyperlipidemia   . Hypothyroidism   . Insomnia     PAST SURGICAL HISTORY:   Past Surgical History:  Procedure Laterality Date  . ABDOMINAL HYSTERECTOMY    . cataracts    . COLON SURGERY     partial colectomy  . intestinal blockage    . NISSEN FUNDOPLICATION      SOCIAL HISTORY:   Social History   Tobacco Use  . Smoking status: Never Smoker  . Smokeless tobacco: Never Used  Substance Use Topics  . Alcohol use: No    FAMILY HISTORY:   Family History  Problem Relation Age of Onset  . Cervical cancer Mother     DRUG ALLERGIES:   Allergies  Allergen Reactions  . Sulfa Antibiotics Other (See Comments)  . Sulfonamide Derivatives     REVIEW OF SYSTEMS:   Review of Systems  Constitutional: Negative for chills, fever and malaise/fatigue.  HENT: Negative  for sore throat.   Eyes: Negative for blurred vision and double vision.  Respiratory: Positive for cough and shortness of breath. Negative for hemoptysis, sputum production, wheezing and stridor.   Cardiovascular: Negative for chest pain, palpitations, orthopnea and leg swelling.  Gastrointestinal: Positive for diarrhea and nausea. Negative for abdominal pain, blood in stool, melena and vomiting.  Genitourinary: Negative for dysuria, flank pain and hematuria.  Musculoskeletal: Negative for back pain and joint pain.  Skin: Negative for rash.  Neurological: Negative for dizziness, sensory change, focal weakness, seizures, loss of consciousness, weakness and headaches.  Endo/Heme/Allergies: Negative for polydipsia.  Psychiatric/Behavioral: Negative for depression. The patient is not nervous/anxious.     MEDICATIONS AT HOME:   Prior to Admission medications   Medication Sig Start Date End Date Taking? Authorizing Provider  acetaminophen (TYLENOL) 500 MG tablet Take 1,000 mg by mouth every 6 (six) hours as needed.   Yes [provider]  albuterol (PROVENTIL) (2.5 MG/3ML) 0.083% nebulizer solution Take 3 mLs (2.5 mg total) by nebulization every 4 (four) hours as needed for wheezing or shortness of breath. 09/27/16  Yes Katharina Caper, MD  aspirin 81 MG chewable tablet Chew 81 mg by mouth daily.   Yes [provider]  clopidogrel (PLAVIX) 75 MG tablet Take 75 mg by mouth daily.    Yes [provider]  furosemide (LASIX) 20 MG tablet Take 20 mg  by mouth daily.   Yes [provider]  levothyroxine (SYNTHROID, LEVOTHROID) 125 MCG tablet Take 125 mcg by mouth daily before breakfast.   Yes [provider]  meloxicam (MOBIC) 7.5 MG tablet Take 7.5 mg by mouth daily.   Yes [provider]  omeprazole (PRILOSEC) 40 MG capsule Take 40 mg by mouth 2 (two) times daily.   Yes [provider]  pravastatin (PRAVACHOL) 40 MG tablet Take 40 mg by mouth  at bedtime.   Yes [provider]  zolpidem (AMBIEN) 5 MG tablet Take 5 mg by mouth at bedtime as needed for sleep.   Yes [provider]      VITAL SIGNS:  Blood pressure (!) 137/94, pulse 78, temperature 98 F (36.7 C), temperature source Oral, resp. rate 18, height 5\' 5"  (1.651 m), weight 77.1 kg, SpO2 97 %.  PHYSICAL EXAMINATION:  Physical Exam  GENERAL:  85 y.o.-year-old patient lying in the bed with no acute distress.  EYES: Pupils equal, round, reactive to light and accommodation. No scleral icterus. Extraocular muscles intact.  HEENT: Head atraumatic, normocephalic. Oropharynx and nasopharynx clear.  NECK:  Supple, no jugular venous distention. No thyroid enlargement, no tenderness.  LUNGS: Normal breath sounds bilaterally, no wheezing, rales,rhonchi or crepitation. No use of accessory muscles of respiration.  CARDIOVASCULAR: S1, S2 normal. No murmurs, rubs, or gallops.  ABDOMEN: Soft, nontender, nondistended. Bowel sounds present. No organomegaly or mass.  EXTREMITIES: No pedal edema, cyanosis, or clubbing.  NEUROLOGIC: Cranial nerves II through XII are intact. Muscle strength 5/5 in all extremities. Sensation intact. Gait not checked.  PSYCHIATRIC: The patient is alert and oriented x 3.  SKIN: No obvious rash, lesion, or ulcer.   LABORATORY PANEL:   CBC Recent Labs  Lab 09/28/18 0946  WBC 10.3  HGB 14.2  HCT 43.9  PLT 200   ------------------------------------------------------------------------------------------------------------------  Chemistries  Recent Labs  Lab 09/28/18 0946  NA 137  K 3.8  CL 104  CO2 25  GLUCOSE 162*  BUN 34*  CREATININE 0.69  CALCIUM 8.8*  AST 26  ALT 17  ALKPHOS 57  BILITOT 1.2   ------------------------------------------------------------------------------------------------------------------  Cardiac Enzymes No results for input(s): TROPONINI in the last 168  hours. ------------------------------------------------------------------------------------------------------------------  RADIOLOGY:  Dg Chest 2 View  Result Date: 09/28/2018 CLINICAL DATA:  85 year old female with diarrhea EXAM: CHEST - 2 VIEW COMPARISON:  03/28/2018 FINDINGS: Cardiomediastinal silhouette unchanged in size and contour. No pneumothorax or pleural effusion. Low lung volumes with coarsened interstitial markings. No confluent airspace disease. Accentuated thoracic kyphotic curvature. No displaced fracture. Double density at the hilar region persists. IMPRESSION: Chronic lung changes with low lung volumes and no evidence of acute cardiopulmonary disease. Hiatal hernia. Electronically Signed   By: Gilmer Mor D.O.   On: 09/28/2018 10:56   Ct Abdomen Pelvis W Contrast  Result Date: 09/28/2018 CLINICAL DATA:  85 year old female with history of diarrhea. Productive cough and shortness of breath. EXAM: CT ABDOMEN AND PELVIS WITH CONTRAST TECHNIQUE: Multidetector CT imaging of the abdomen and pelvis was performed using the standard protocol following bolus administration of intravenous contrast. CONTRAST:  OMNIPAQUE IOHEXOL 300 MG/ML SOLN, 30mL ISOVUE-300 IOPAMIDOL (ISOVUE-300) INJECTION 61% COMPARISON:  CT the abdomen and pelvis 10/14/2016. FINDINGS: Lower chest: Large hiatal hernia. Thickening calcification of the aortic valve. Atherosclerotic calcifications in the left anterior descending and right coronary arteries. Hepatobiliary: No suspicious cystic or solid hepatic lesions. No intra or extrahepatic biliary ductal dilatation. Gallbladder is normal in appearance. Pancreas: No pancreatic  mass. No pancreatic ductal dilatation. No pancreatic or peripancreatic fluid or inflammatory changes. Spleen: Unremarkable. Adrenals/Urinary Tract: 3.1 cm low-attenuation lesion in the interpolar region of the left kidney, compatible with a simple cyst. Mild multifocal cortical scarring in the right  kidney. Bilateral adrenal glands are normal in appearance. No hydroureteronephrosis. Urinary bladder is nearly completely decompressed. Base of the urinary bladder lies well below (3.5 cm) the level of the pubococcygeal line at rest, indicative of a cystocele. Stomach/Bowel: Normal appearance of the intra-abdominal portion of the stomach. Several borderline and mildly dilated loops of small bowel are noted in the proximal small bowel measuring up to 3.6 cm in diameter. Transition to nondilated small bowel noted in the central abdomen on axial image 47 of series 2. More distal small bowel is otherwise relatively decompressed, with only minimal oral contrast beyond this point. As well, the colon is decompressed. The appendix is not confidently identified and may be surgically absent. Regardless, there are no inflammatory changes noted adjacent to the cecum to suggest the presence of an acute appendicitis at this time. Numerous colonic diverticulae are noted, without surrounding inflammatory changes to suggest an acute diverticulitis at this time. Vascular/Lymphatic: Aortic atherosclerosis, without evidence of aneurysm or dissection in the abdominal or pelvic vasculature. No lymphadenopathy noted in the abdomen or pelvis. Reproductive: Status post hysterectomy. Ovaries are not confidently identified may be surgically absent or atrophic. Other: No significant volume of ascites.  No pneumoperitoneum. Musculoskeletal: There are no aggressive appearing lytic or blastic lesions noted in the visualized portions of the skeleton. IMPRESSION: 1. Findings are suggestive of partial small bowel obstruction with transition point in the mid small bowel, as detailed above. 2. Colonic diverticulosis without evidence of acute diverticulitis at this time. 3. There are calcifications of the aortic valve. Echocardiographic correlation for evaluation of potential valvular dysfunction may be warranted if clinically indicated. Large hiatal  hernia. 4. Aortic atherosclerosis, in addition to 2 vessel coronary artery disease. 5. Cystocele. Electronically Signed   By: Trudie Reed M.D.   On: 09/28/2018 13:13   Dg Abd 2 Views  Result Date: 09/28/2018 CLINICAL DATA:  Patient reports waking up during the with diarrhea. Patient states she has been unable to get to the restroom on time. Patient states she has been on 3 rounds of antibiotics over the last month for respiratory issues. EXAM: ABDOMEN - 2 VIEW COMPARISON:  03/06/2016 FINDINGS: There is dilatation of small bowel loops within the LEFT LOWER QUADRANT. Although there is gas within the colon, with colonic loops appear less distended. No evidence for organomegaly. Scattered phleboliths in the pelvis. IMPRESSION: Early or partial small bowel obstruction. Electronically Signed   By: Norva Pavlov M.D.   On: 09/28/2018 10:57      IMPRESSION AND PLAN:   Partial small bowel obstruction with diarrhea. The patient will be admitted to medical floor. N.p.o. except medication, IV fluid support, follow-up C. difficile test and surgery consult.  Dehydration.  IV fluid support, hold Lasix and follow-up BMP.  Persistent cough.  Possible bronchitis.  Robitussin as needed. Diverticulosis without diverticulitis.  All the records are reviewed and case discussed with ED provider. Management plans discussed with the patient, her daughter and they are in agreement.  CODE STATUS: DNR  TOTAL TIME TAKING CARE OF THIS PATIENT: 33 minutes.    Shaune Pollack M.D on 09/28/2018 at 2:32 PM  Between 7am to 6pm - Pager - [phone]  After 6pm go to [email or web address] - password [email or web address] [facility]  [employer]   Hospitalists  Office  [phone]  CC: Primary care physician; Corky Downs, MD   Note: This dictation was prepared with Dragon dictation along with smaller phrase technology. Any transcriptional errors that result from this process are unin

## 2018-09-28 NOTE — ED Triage Notes (Signed)
Patient from home via [facility]. Patient reports waking up during the with diarrhea. Patient states she has been unable to get to the restroom on time. Patient states she has been on 3 rounds of antibiotics over the last month for respiratory issues. Patient also reports productive cough and shortness of breath. History of COPD.

## 2018-09-28 NOTE — Progress Notes (Signed)
Advanced Care Plan.  Purpose of Encounter: CODE STATUS. Parties in Attendance: The patient, her daughter and me. Patient's Decisional Capacity: Yes. Medical Story: Abigail Strong  is a 85 y.o. female with a known history of hyperlipidemia, hypothyroidism, depression and GERD.  The patient is being admitted for partial small bowel obstruction and diarrhea, rule out C. difficile colitis.  I discussed with the patient about her current condition, prognosis and CODE STATUS.  The patient does not want to be resuscitated or intubated if she has cardiopulmonary arrest. Plan:  Code Status: DNR. Time spent discussing advance care planning: 17 minutes.

## 2018-09-29 DIAGNOSIS — K56600 Partial intestinal obstruction, unspecified as to cause: Principal | ICD-10-CM

## 2018-09-29 LAB — CBC
HCT: 34.5 % — ABNORMAL LOW (ref 36.0–46.0)
Hemoglobin: 10.9 g/dL — ABNORMAL LOW (ref 12.0–15.0)
MCH: 29.6 pg (ref 26.0–34.0)
MCHC: 31.6 g/dL (ref 30.0–36.0)
MCV: 93.8 fL (ref 80.0–100.0)
Platelets: 173 10*3/uL (ref 150–400)
RBC: 3.68 MIL/uL — ABNORMAL LOW (ref 3.87–5.11)
RDW: 13.9 % (ref 11.5–15.5)
WBC: 5.6 10*3/uL (ref 4.0–10.5)
nRBC: 0 % (ref 0.0–0.2)

## 2018-09-29 LAB — BASIC METABOLIC PANEL
Anion gap: 3 — ABNORMAL LOW (ref 5–15)
BUN: 18 mg/dL (ref 8–23)
CO2: 27 mmol/L (ref 22–32)
Calcium: 8.2 mg/dL — ABNORMAL LOW (ref 8.9–10.3)
Chloride: 111 mmol/L (ref 98–111)
Creatinine, Ser: 0.6 mg/dL (ref 0.44–1.00)
GFR calc Af Amer: >60 mL/min (ref >60)
GFR calc non Af Amer: >60 mL/min (ref >60)
Glucose, Bld: 91 mg/dL (ref 70–99)
Potassium: 4 mmol/L (ref 3.5–5.1)
Sodium: 141 mmol/L (ref 135–145)

## 2018-09-29 MED ORDER — AZITHROMYCIN 250 MG PO TABS
500.0000 mg | ORAL_TABLET | Freq: Every day | ORAL | Status: DC
  Administered 2018-09-29 – 2018-10-01 (×3): 500 mg via ORAL
  Filled 2018-09-29 (×3): qty 2

## 2018-09-29 NOTE — Progress Notes (Signed)
MD messaged: FYI, the patient is depressed this morning.

## 2018-09-29 NOTE — Plan of Care (Signed)
The patient was advance to a clear liquid diet. No nausea/vomiting and no bowel movements have been reported. Iv fluids running. The patient was depressed this morning and the MD was notified. Oral antibiotics provided.  Problem: Activity: Goal: Risk for activity intolerance will decrease Outcome: Progressing   Problem: Nutrition: Goal: Adequate nutrition will be maintained Outcome: Progressing   Problem: Elimination: Goal: Will not experience complications related to bowel motility Outcome: Progressing Goal: Will not experience complications related to urinary retention Outcome: Progressing   Problem: Pain Managment: Goal: General experience of comfort will improve Outcome: Progressing   Problem: Safety: Goal: Ability to remain free from injury will improve Outcome: Progressing   Problem: Skin Integrity: Goal: Risk for impaired skin integrity will decrease Outcome: Progressing

## 2018-09-29 NOTE — Progress Notes (Signed)
[employer] - [place] at [facility]   PATIENT NAME: Abigail Strong    MR#:  [id number]  DATE OF BIRTH:  1930-07-15  SUBJECTIVE:  CHIEF COMPLAINT:   Chief Complaint  Patient presents with  . Diarrhea  . Shortness of Breath   No new complaint this morning.  Denies any nausea or vomiting.  No abdominal pains or diarrhea.  Still has some intermittent nonproductive cough.  Evaluated by surgeon this morning.  REVIEW OF SYSTEMS:  Review of Systems  Constitutional: Negative for chills and fever.  HENT: Negative for hearing loss and tinnitus.   Eyes: Negative for blurred vision and double vision.  Respiratory: Positive for cough. Negative for sputum production and wheezing.   Cardiovascular: Negative for chest pain, palpitations and leg swelling.  Gastrointestinal: Negative for abdominal pain, heartburn, nausea and vomiting.  Genitourinary: Negative for dysuria and urgency.  Musculoskeletal: Negative for myalgias and neck pain.  Skin: Negative for itching and rash.  Neurological: Negative for dizziness and headaches.  Psychiatric/Behavioral: Negative for hallucinations.       Reported feeling depressed due to multiple acute medical problems lately.  Denies any suicidal or homicidal ideations.       DRUG ALLERGIES:   Allergies  Allergen Reactions  . Sulfa Antibiotics Other (See Comments)  . Sulfonamide Derivatives    VITALS:  Blood pressure 106/61, pulse 74, temperature 97.8 F (36.6 C), temperature source Oral, resp. rate 19, height 5\' 5"  (1.651 m), weight 77.1 kg, SpO2 100 %. PHYSICAL EXAMINATION:   Physical Exam  Constitutional: She is oriented to person, place, and time. She appears well-developed and well-nourished.  HENT:  Head: Normocephalic and atraumatic.  Eyes: Pupils are equal, round, and reactive to light. Conjunctivae and EOM are normal.  Neck: Neck supple.  Cardiovascular: Normal rate, regular rhythm and normal heart sounds.  Respiratory:  Effort normal. No stridor. She has rales.  GI: Soft. Bowel sounds are normal. There is no abdominal tenderness.  Musculoskeletal: Normal range of motion.        General: No edema.  Neurological: She is alert and oriented to person, place, and time. No cranial nerve deficit.  Skin: Skin is warm. No erythema.  Psychiatric: She has a normal mood and affect. Her behavior is normal.   LABORATORY PANEL:  Female CBC Recent Labs  Lab 09/29/18 0421  WBC 5.6  HGB 10.9*  HCT 34.5*  PLT 173   ------------------------------------------------------------------------------------------------------------------ Chemistries  Recent Labs  Lab 09/28/18 0946 09/29/18 0421  NA 137 141  K 3.8 4.0  CL 104 111  CO2 25 27  GLUCOSE 162* 91  BUN 34* 18  CREATININE 0.69 0.60  CALCIUM 8.8* 8.2*  AST 26  --   ALT 17  --   ALKPHOS 57  --   BILITOT 1.2  --    RADIOLOGY:  No results found. ASSESSMENT AND PLAN:   1. Partial small bowel obstruction with diarrhea. No more diarrhea since admission.  No nausea or vomiting or abdominal pain this morning. Reevaluated by surgeon this morning.  Started on clear liquid diet.  PRN antiemetics.  Will discontinue IV fluids the patient tolerates p.o. intake.Diverticulosis without diverticulitis.  Stool samples already requested for work-up but no more diarrhea since admission.  2.  Acute bronchitis Patient with mostly nonproductive cough.  Chest x-ray with no acute findings. Started empirically on azithromycin.  3.  Depression Secondary to multiple medical problems and not being able to sleep well last night. Patient denies any suicidal  or homicidal ideations. Monitor closely and initiate antidepressants if no improvement with rest today  4.  Dehydration.  Being hydrated with IV fluids.  Lasix on hold.  DVT prophylaxis; Lovenox  All the records are reviewed and case discussed with Care Management/Social Worker. Management plans discussed with the  patient, family and they are in agreement.  CODE STATUS: DNR  TOTAL TIME TAKING CARE OF THIS PATIENT: 36 minutes.   More than 50% of the time was spent in counseling/coordination of care: YES  POSSIBLE D/C IN 2 DAYS, DEPENDING ON CLINICAL CONDITION.   Forest Redwine M.D on 09/29/2018 at 1:24 PM  Between 7am to 6pm - Pager - [phone]  After 6pm go to [email or web address] - Social research officer, government  [employer] [place] Hospitalists  Office  [phone]  CC: Primary care physician; Corky Downs, MD  Note: This dictation was prepared with Dragon dictation along with smaller phrase technology. Any transcriptional errors that result from this process are unintentional.

## 2018-09-29 NOTE — Progress Notes (Signed)
[facility] SURGICAL PROGRESS NOTE (cpt [phone])  Hospital Day(s): 1.   Post op day(s):  Abigail Strong   Interval History: Patient seen and examined, no acute events or new complaints overnight. Patient reports that she continues to have no abdominal pain, nausea, or emesis. She does feel more depressed this morning. She denied any other complaints,. She is interested in eating. She continues to endorse flatus overnight.  Review of Systems:  Constitutional: denies fever, chills  Respiratory: denies any shortness of breath  Cardiovascular: denies chest pain or palpitations  Gastrointestinal: denies abdominal pain, N/V, or diarrhea/and bowel function as per interval history Genitourinary: denies burning with urination or urinary frequency Neurological: + Depression   Vital signs in last 24 hours: [min-max] current  Temp:  [98 F (36.7 C)-98.6 F (37 C)] 98.2 F (36.8 C) (02/10 0438) Pulse Rate:  [74-105] 74 (02/10 0438) Resp:  [16-20] 16 (02/10 0438) BP: (94-137)/(54-94) 101/58 (02/10 0438) SpO2:  [96 %-100 %] 98 % (02/10 0438)     Height: 5\' 5"  (165.1 cm) Weight: 77.1 kg BMI (Calculated): 28.29   Intake/Output this shift:  Total I/O In: -  Out: 150 [Urine:150]    Physical Exam:  Constitutional: alert, cooperative and no distress  HENT: normocephalic without obvious abnormality  Respiratory: breathing non-labored at rest  Gastrointestinal: soft, non-tender, and non-distended Musculoskeletal: UE and LE FROM, no edema or wounds, motor and sensation grossly intact, NT    Labs:  CBC Latest Ref Rng & Units 09/29/2018 09/28/2018 03/28/2018  WBC 4.0 - 10.5 K/uL 5.6 10.3 9.1  Hemoglobin 12.0 - 15.0 g/dL 10.9(L) 14.2 13.5  Hematocrit 36.0 - 46.0 % 34.5(L) 43.9 39.7  Platelets 150 - 400 K/uL 173 200 259   CMP Latest Ref Rng & Units 09/29/2018 09/28/2018 03/28/2018  Glucose 70 - 99 mg/dL 91 162(H) 100(H)  BUN 8 - 23 mg/dL 18 34(H) 21  Creatinine 0.44 - 1.00 mg/dL 0.60 0.69 0.79   Sodium 135 - 145 mmol/L 141 137 143  Potassium 3.5 - 5.1 mmol/L 4.0 3.8 4.0  Chloride 98 - 111 mmol/L 111 104 104  CO2 22 - 32 mmol/L 27 25 30   Calcium 8.9 - 10.3 mg/dL 8.2(L) 8.8(L) 9.7  Total Protein 6.5 - 8.1 g/dL - 6.9 7.0  Total Bilirubin 0.3 - 1.2 mg/dL - 1.2 0.7  Alkaline Phos 38 - 126 U/L - 57 88  AST 15 - 41 U/L - 26 20  ALT 0 - 44 U/L - 17 12      Assessment/Plan:  85 y.o. female with improving/resolved questionable small bowel obsturction   - Start clear liquids, continue IVF   - Antiemetics prn  - Continue to monitor abdominal pain, on-going bowel function - medical management of comorbidities  - DVT Prophylaxis   All of the above findings and recommendations were discussed with the patient, and the medical team, and all of patient's questions were answered to her expressed satisfaction.  -- Edison Simon, PA-C [facility] 09/29/2018, 10:33 AM [phone] M-F: 7am - 4pm

## 2018-09-29 NOTE — Evaluation (Signed)
Physical Therapy Evaluation Patient Details Name: Abigail Strong MRN: [id number] DOB: 08-Jun-1930 Today's Date: 09/29/2018   History of Present Illness  Patient is 85 yo female presented to ED with nausea, diarrhea, SOB. PMH of depression, GERD, HLD, hypothyroidsm   Clinical Impression  Patient alert, up in chair at start of session. Oriented to self, situation, behavior WFLs. Patient reported living in one story home with extended family, cooks, cleans, ambulates in home with rollator, uses [facility] [employer] for church, does state that she drives but not currently able to due to no car. Patient stated that she has multiple falls, and that her knees "give out" on her.  Patient demonstrated transfers and ambulation mod I with RW, vitals WFLs throughout on room air. Ambulated ~73ft. Tended to have RW outside base of support, flexed trunk, and decreased gait velocity. Overall patient demonstrated good mobility and balance, with mild deficits in activity tolerance and strength noted. The patient would benefit from further skilled PT to decrease risk of falls and to maximize mobility, strength, and activity tolerance.     Follow Up Recommendations Home health PT    Equipment Recommendations  None recommended by PT    Recommendations for Other Services       Precautions / Restrictions Precautions Precautions: Fall Restrictions Weight Bearing Restrictions: No      Mobility  Bed Mobility               General bed mobility comments: deferred, up in chair  Transfers Overall transfer level: Modified independent Equipment used: Rolling walker (2 wheeled)                Ambulation/Gait Ambulation/Gait assistance: Modified independent (Device/Increase time) Gait Distance (Feet): 50 Feet Assistive device: Rolling walker (2 wheeled)       General Gait Details: Tends to ambulate with RW outside BOS. Flexed trunk, decreased gait velocity. Endorses at home she has to take breaks after  ambulation.  Stairs            Wheelchair Mobility    Modified Rankin (Stroke Patients Only)       Balance Overall balance assessment: Mild deficits observed, not formally tested;Needs assistance Sitting-balance support: Feet supported Sitting balance-Leahy Scale: Good       Standing balance-Leahy Scale: Fair                               Pertinent Vitals/Pain Pain Assessment: No/denies pain    Home Living Family/patient expects to be discharged to:: Private residence Living Arrangements: Children Available Help at Discharge: Family;Available PRN/intermittently Type of Home: House Home Access: Stairs to enter Entrance Stairs-Rails: None Entrance Stairs-Number of Steps: 1 Home Layout: One level Home Equipment: Walker - 4 wheels;Walker - 2 wheels;Cane - single point;Grab bars - tub/shower Additional Comments: Family assists her up the 1 STE.    Prior Function Level of Independence: Independent with assistive device(s)         Comments: Patient reported that she has been falling a lot, stated none in the last 6 months. Stated her knees "give out" on her. Drives, cooks, cleans (but does not have her car anymore)     Hand Dominance        Extremity/Trunk Assessment   Upper Extremity Assessment Upper Extremity Assessment: Generalized weakness    Lower Extremity Assessment Lower Extremity Assessment: Generalized weakness    Cervical / Trunk Assessment Cervical / Trunk Assessment: Kyphotic  Communication  Communication: No difficulties  Cognition Arousal/Alertness: Awake/alert Behavior During Therapy: WFL for tasks assessed/performed Overall Cognitive Status: Within Functional Limits for tasks assessed                                        General Comments      Exercises     Assessment/Plan    PT Assessment Patient needs continued PT services  PT Problem List Decreased strength;Decreased activity  tolerance;Decreased mobility;Decreased balance       PT Treatment Interventions DME instruction;Therapeutic exercise;Gait training;Balance training;Stair training;Functional mobility training;Therapeutic activities;Patient/family education;Neuromuscular re-education    PT Goals (Current goals can be found in the Care Plan section)  Acute Rehab PT Goals Patient Stated Goal: to return to PLOF PT Goal Formulation: With patient Time For Goal Achievement: 10/13/18 Potential to Achieve Goals: Good    Frequency Min 2X/week   Barriers to discharge        Co-evaluation               AM-PAC PT "6 Clicks" Mobility  Outcome Measure Help needed turning from your back to your side while in a flat bed without using bedrails?: None Help needed moving from lying on your back to sitting on the side of a flat bed without using bedrails?: None Help needed moving to and from a bed to a chair (including a wheelchair)?: A Little Help needed standing up from a chair using your arms (e.g., wheelchair or bedside chair)?: None Help needed to walk in hospital room?: None Help needed climbing 3-5 steps with a railing? : A Little 6 Click Score: 22    End of Session Equipment Utilized During Treatment: Gait belt Activity Tolerance: Patient tolerated treatment well Patient left: in chair;with call bell/phone within reach;with family/visitor present Nurse Communication: Mobility status PT Visit Diagnosis: Other abnormalities of gait and mobility (R26.89);Difficulty in walking, not elsewhere classified (R26.2);Muscle weakness (generalized) (M62.81)    Time: 1610-9604 PT Time Calculation (min) (ACUTE ONLY): 20 min   Charges:   PT Evaluation $PT Eval Low Complexity: 1 Low         Olga Coaster PT, DPT 2:55 PM,09/29/18 [phone]

## 2018-09-30 LAB — BASIC METABOLIC PANEL
Anion gap: 3 — ABNORMAL LOW (ref 5–15)
BUN: 9 mg/dL (ref 8–23)
CO2: 29 mmol/L (ref 22–32)
Calcium: 8.7 mg/dL — ABNORMAL LOW (ref 8.9–10.3)
Chloride: 109 mmol/L (ref 98–111)
Creatinine, Ser: 0.59 mg/dL (ref 0.44–1.00)
GFR calc Af Amer: >60 mL/min (ref >60)
GFR calc non Af Amer: >60 mL/min (ref >60)
Glucose, Bld: 93 mg/dL (ref 70–99)
Potassium: 4 mmol/L (ref 3.5–5.1)
Sodium: 141 mmol/L (ref 135–145)

## 2018-09-30 LAB — MAGNESIUM: Magnesium: 2.1 mg/dL (ref 1.7–2.4)

## 2018-09-30 MED ORDER — BISACODYL 10 MG RE SUPP: 10.0000 mg | Freq: Every day | RECTAL | Status: DC | PRN

## 2018-09-30 MED ORDER — BISACODYL 10 MG RE SUPP
10.0000 mg | Freq: Once | RECTAL | Status: AC
  Administered 2018-09-30: 10 mg via RECTAL
  Filled 2018-09-30: qty 1

## 2018-09-30 NOTE — Progress Notes (Addendum)
[facility] SURGICAL PROGRESS NOTE (cpt [phone])  Hospital Day(s): 2.   Post op day(s):  [name] Kitchen   Interval History: Patient seen and examined, no acute events or new complaints overnight. Patient reports that she continues to not have any abdominal pain, fever, chills, nausea, or emesis. She continues to endorse passing flatus. She denied any further diarrhea since admission over 48 hours ago. Would like to get out of bed and mobilize. No other complaints this morning.   Review of Systems:  Constitutional: denies fever, chills  Gastrointestinal: denies abdominal pain, N/V, or diarrhea/and bowel function as per interval history Genitourinary: denies burning with urination or urinary frequency   Vital signs in last 24 hours: [min-max] current  Temp:  [97.8 F (36.6 C)-98.2 F (36.8 C)] 98.2 F (36.8 C) (02/11 0427) Pulse Rate:  [68-74] 68 (02/11 0427) Resp:  [18-19] 18 (02/11 0427) BP: (102-106)/(61-67) 102/67 (02/11 0427) SpO2:  [92 %-100 %] 100 % (02/11 0857)     Height: 5\' 5"  (165.1 cm) Weight: 77.1 kg BMI (Calculated): 28.29   Intake/Output this shift:  Total I/O In: -  Out: 150 [Urine:150]   Intake/Output last 2 shifts:  @IOLAST2SHIFTS @   Physical Exam:  Constitutional: alert, cooperative and no distress  HENT: normocephalic without obvious abnormality  Respiratory: breathing non-labored at rest  Gastrointestinal: soft, non-tender, and non-distended Musculoskeletal: no edema or wounds, motor and sensation grossly intact, NT    Labs:  CBC Latest Ref Rng & Units 09/29/2018 09/28/2018 03/28/2018  WBC 4.0 - 10.5 K/uL 5.6 10.3 9.1  Hemoglobin 12.0 - 15.0 g/dL 10.9(L) 14.2 13.5  Hematocrit 36.0 - 46.0 % 34.5(L) 43.9 39.7  Platelets 150 - 400 K/uL 173 200 259   CMP Latest Ref Rng & Units 09/30/2018 09/29/2018 09/28/2018  Glucose 70 - 99 mg/dL 93 91 162(H)  BUN 8 - 23 mg/dL 9 18 34(H)  Creatinine 0.44 - 1.00 mg/dL 0.59 0.60 0.69  Sodium 135 - 145 mmol/L 141 141 137   Potassium 3.5 - 5.1 mmol/L 4.0 4.0 3.8  Chloride 98 - 111 mmol/L 109 111 104  CO2 22 - 32 mmol/L 29 27 25   Calcium 8.9 - 10.3 mg/dL 8.7(L) 8.2(L) 8.8(L)  Total Protein 6.5 - 8.1 g/dL - - 6.9  Total Bilirubin 0.3 - 1.2 mg/dL - - 1.2  Alkaline Phos 38 - 126 U/L - - 57  AST 15 - 41 U/L - - 26  ALT 0 - 44 U/L - - 17    Imaging studies: No new pertinent imaging studies   Assessment/Plan: (ICD-10: K83.60) 85 y.o. female with improved/resolved small bowel obstruction, without continued diarrhea which make C diff suspicion low.    - will advance to Soft diet this morning  - Will give Dulcolax Suppository to aid with bowel function. C Diff is less likely and would agree with discontinuing enteric precautions.   - Antiemetics prn             - Continue to monitor abdominal pain, on-going bowel function         - medical management of comorbidities               - General surgery will sign off, if tolerating soft diet patient is cleared from general surgery standpoint. Please re-consult if new issues arise.   All of the above findings and recommendations were discussed with the patient, and the medical team, and all of patient's questions were answered to her expressed satisfaction.  -- Abigail Simon,  PA-C [facility] 09/30/2018, 9:17 AM [phone] M-F: 7am - 4pm

## 2018-09-30 NOTE — Plan of Care (Signed)
On room air. Oxygen sat >93% on room air. No falls. Voiding. C.Diff precautions D/C. Patient had a BM after a suppository. IV fluids running. The patient has been stable. Most likely D/C tomorrow. Tolerated a soft diet.  Problem: Activity: Goal: Risk for activity intolerance will decrease Outcome: Progressing   Problem: Nutrition: Goal: Adequate nutrition will be maintained Outcome: Progressing   Problem: Elimination: Goal: Will not experience complications related to bowel motility Outcome: Progressing Goal: Will not experience complications related to urinary retention Outcome: Progressing   Problem: Pain Managment: Goal: General experience of comfort will improve Outcome: Progressing   Problem: Safety: Goal: Ability to remain free from injury will improve Outcome: Progressing   Problem: Skin Integrity: Goal: Risk for impaired skin integrity will decrease Outcome: Progressing

## 2018-09-30 NOTE — Progress Notes (Signed)
[facility] - [place] at [facility]   PATIENT NAME: Abigail Strong    MR#:  [id number]  DATE OF BIRTH:  12-Sep-1929  SUBJECTIVE:  CHIEF COMPLAINT:   Chief Complaint  Patient presents with  . Diarrhea  . Shortness of Breath   No new complaint this morning.  Denies any nausea or vomiting.  No abdominal pains or diarrhea.  Still has some intermittent nonproductive cough.   Diet advanced to soft diet and by surgeon this morning.  REVIEW OF SYSTEMS:  Review of Systems  Constitutional: Negative for chills and fever.  HENT: Negative for hearing loss and tinnitus.   Eyes: Negative for blurred vision and double vision.  Respiratory: Positive for cough. Negative for sputum production and wheezing.   Cardiovascular: Negative for chest pain, palpitations and leg swelling.  Gastrointestinal: Negative for abdominal pain, heartburn, nausea and vomiting.  Genitourinary: Negative for dysuria and urgency.  Musculoskeletal: Negative for myalgias and neck pain.  Skin: Negative for itching and rash.  Neurological: Negative for dizziness and headaches.  Psychiatric/Behavioral: Negative for hallucinations.       Reported feeling depressed due to multiple acute medical problems lately.  Denies any suicidal or homicidal ideations.       DRUG ALLERGIES:   Allergies  Allergen Reactions  . Sulfa Antibiotics Other (See Comments)  . Sulfonamide Derivatives    VITALS:  Blood pressure 102/67, pulse 68, temperature 98.2 F (36.8 C), temperature source Oral, resp. rate 18, height 5\' 5"  (1.651 m), weight 77.1 kg, SpO2 100 %. PHYSICAL EXAMINATION:   Physical Exam  Constitutional: She is oriented to person, place, and time. She appears well-developed and well-nourished.  HENT:  Head: Normocephalic and atraumatic.  Eyes: Pupils are equal, round, and reactive to light. Conjunctivae and EOM are normal.  Neck: Neck supple.  Cardiovascular: Normal rate, regular rhythm and normal heart  sounds.  Respiratory: Effort normal. No stridor. She has rales.  GI: Soft. Bowel sounds are normal. There is no abdominal tenderness.  Musculoskeletal: Normal range of motion.        General: No edema.  Neurological: She is alert and oriented to person, place, and time. No cranial nerve deficit.  Skin: Skin is warm. No erythema.  Psychiatric: She has a normal mood and affect. Her behavior is normal.   LABORATORY PANEL:  Female CBC Recent Labs  Lab 09/29/18 0421  WBC 5.6  HGB 10.9*  HCT 34.5*  PLT 173   ------------------------------------------------------------------------------------------------------------------ Chemistries  Recent Labs  Lab 09/28/18 0946  09/30/18 0610  NA 137   < > 141  K 3.8   < > 4.0  CL 104   < > 109  CO2 25   < > 29  GLUCOSE 162*   < > 93  BUN 34*   < > 9  CREATININE 0.69   < > 0.59  CALCIUM 8.8*   < > 8.7*  MG  --   --  2.1  AST 26  --   --   ALT 17  --   --   ALKPHOS 57  --   --   BILITOT 1.2  --   --    < > = values in this interval not displayed.   RADIOLOGY:  No results found. ASSESSMENT AND PLAN:   1. Partial small bowel obstruction with diarrhea. No more diarrhea since admission.  No nausea or vomiting or abdominal pain this morning. Reevaluated by surgeon this morning.  Diet advanced to soft diet  this morning.  PRN antiemetics.  IV fluids previously discontinued. Diverticulosis without diverticulitis. Stool samples already requested for work-up but no more diarrhea since admission.  2.  Acute bronchitis Patient with mostly nonproductive cough.  Chest x-ray with no acute findings. Started empirically on azithromycin.  Patient still having intermittent cough.  3.  Depression Secondary to multiple medical problems and not being able to sleep well Patient denies any suicidal or homicidal ideations.  Patient reports feeling much better today.    4.  Dehydration.  Adequately hydrated with IV fluids  Disposition; plans to discharge  tomorrow if patient tolerating diet well and feeling better. Patient seen by physical therapist.  Recommendation is for home health with physical therapy on discharge  DVT prophylaxis; Lovenox  All the records are reviewed and case discussed with Care Management/Social Worker. Management plans discussed with the patient, family and they are in agreement.  CODE STATUS: DNR  TOTAL TIME TAKING CARE OF THIS PATIENT: 28 minutes.   More than 50% of the time was spent in counseling/coordination of care: YES    Sina Sumpter M.D on 09/30/2018 at 1:30 PM  Between 7am to 6pm - Pager - [phone]  After 6pm go to [email or web address] - Social research officer, government  [facility]  Office  [phone]  CC: Primary care physician; Corky Downs, MD  Note: This dictation was prepared with Dragon dictation along with smaller phrase technology. Any transcriptional errors that result from this process are unintentional.

## 2018-10-01 MED ORDER — GUAIFENESIN-DM 100-10 MG/5ML PO SYRP: 5.0000 mL | ORAL_SOLUTION | ORAL | 0 refills | Status: AC | PRN

## 2018-10-01 MED ORDER — AZITHROMYCIN 250 MG PO TABS: 500.0000 mg | ORAL_TABLET | Freq: Every day | ORAL | 0 refills | Status: DC

## 2018-10-01 NOTE — Care Management Note (Signed)
Case Management Note  Patient Details  Name: Abigail Strong MRN: [id number] Date of Birth: 14-Feb-1930   Patient discharged home today. Patient states she lives at home with her daughter and grandfather.  She is home alone during the day while they work.  PCP [facility].  Pharmacy [facility].  Patient denies issues obtaining medications or with transportation.  Patient states that she has a Rollator, RW, and cane in the home. PT has assessed patient and recommends home health PT.  Patient agreeable to services.  CMS Medicare.gov Compare Post Acute Care list reviewed with patient.  Patient states she does not have a preference of agency.  Referral made to [facility] with [facility].  RNCM signing off.   Subjective/Objective:                    Action/Plan:   Expected Discharge Date:  10/01/18               Expected Discharge Plan:  [place]  In-House Referral:     Discharge planning Services  CM Consult  Post Acute Care Choice:  Home Health Choice offered to:  Patient  DME Arranged:    DME Agency:     HH Arranged:  PT HH Agency:  [place]  Status of Service:  Completed, signed off  If discussed at [place] of Stay Meetings, dates discussed:    Additional Comments:  Beverly Sessions, RN 10/01/2018, 3:56 PM

## 2018-10-01 NOTE — Care Management Important Message (Signed)
Copy of signed Medicare IM left with patient in room. 

## 2018-10-01 NOTE — Discharge Summary (Signed)
[facility] [place] [facility]   PATIENT NAME: Abigail Strong    MR#:  [id number]  DATE OF BIRTH:  Oct 22, 1929  DATE OF ADMISSION:  09/28/2018   ADMITTING PHYSICIAN: Shaune Pollack, MD  DATE OF DISCHARGE: 10/01/2018  PRIMARY CARE PHYSICIAN: Corky Downs, MD   ADMISSION DIAGNOSIS:  Loose stools [R19.5] Small bowel obstruction, partial (HCC) [K56.600] Acute bronchitis, unspecified organism [J20.9] DISCHARGE DIAGNOSIS:  Active Problems:   Partial small bowel obstruction (HCC)  SECONDARY DIAGNOSIS:   Past Medical History:  Diagnosis Date  . Depression   . GERD (gastroesophageal reflux disease)   . Hyperlipidemia   . Hypothyroidism   . Insomnia    HOSPITAL COURSE:  Chief complaint; shortness of breath and diarrhea   History of presenting complaint; Abigail Strong  is a 85 y.o. female with a known history of hyperlipidemia, hypothyroidism, depression and GERD.  The patient presented the ED with above chief complaints.  The patient has had a cough for 1 months.  She has been treated with 3 different antibiotics but still has cough.  She developed diarrhea with loose stool multiple times since yesterday.  She has no complaints of abdominal pain or vomiting but has nausea.  No melena or bloody stool.  CAT scan of the abdomen report partial small bowel obstruction.  Dr. Fanny Bien discussed with on-call surgeon and requested admission.  Please refer to the H&P dictated for further details   Hospital course; 1. Partial small bowel obstruction with diarrhea. No more diarrhea since admission.  No nausea or vomiting or abdominal pain since admission morning. Reevaluated by surgeon this morning.  Diet advanced to soft diet and patient tolerating well.  Had bowel movements.  Significantly improved clinically. Stool samples already requested for work-up but no more diarrhea since admission.  Cleared for discharge  2.  Acute bronchitis Patient with mostly nonproductive  cough.  Chest x-ray with no acute findings. Started empirically on azithromycin.  Patient still having intermittent cough.  PRN Robitussin.  Oxygen saturation was 95% on room air.  To follow-up with primary care physician.  If symptoms persist primary care physician to refer to follow-up with pulmonologist.  3.  Depression; resolved This was secondary to multiple medical problems and not being able to sleep well Patient denies any suicidal or homicidal ideations.  Patient reports being completely asymptomatic prior to discharge feeling much better   4.  Dehydration.  Adequately hydrated with IV fluids prior to discharge  Disposition; Patient seen by physical therapist.  Recommendation is for home health with physical therapy on discharge.  Case manager to set up on discharge    DISCHARGE CONDITIONS:  Stable CONSULTS OBTAINED:  Treatment Team:  Henrene Dodge, MD DRUG ALLERGIES:   Allergies  Allergen Reactions  . Sulfa Antibiotics Other (See Comments)  . Sulfonamide Derivatives    DISCHARGE MEDICATIONS:   Allergies as of 10/01/2018      Reactions   Sulfa Antibiotics Other (See Comments)   Sulfonamide Derivatives       Medication List    TAKE these medications   acetaminophen 500 MG tablet Commonly known as:  TYLENOL Take 1,000 mg by mouth every 6 (six) hours as needed.   albuterol (2.5 MG/3ML) 0.083% nebulizer solution Commonly known as:  PROVENTIL Take 3 mLs (2.5 mg total) by nebulization every 4 (four) hours as needed for wheezing or shortness of breath.   aspirin 81 MG chewable tablet Chew 81 mg by mouth daily.   azithromycin 250 MG  tablet Commonly known as:  ZITHROMAX Take 2 tablets (500 mg total) by mouth daily.   clopidogrel 75 MG tablet Commonly known as:  PLAVIX Take 75 mg by mouth daily.   furosemide 20 MG tablet Commonly known as:  LASIX Take 20 mg by mouth daily.   guaiFENesin-dextromethorphan 100-10 MG/5ML syrup Commonly known as:  ROBITUSSIN  DM Take 5 mLs by mouth every 4 (four) hours as needed for up to 5 days for cough.   levothyroxine 125 MCG tablet Commonly known as:  SYNTHROID, LEVOTHROID Take 125 mcg by mouth daily before breakfast.   meloxicam 7.5 MG tablet Commonly known as:  MOBIC Take 7.5 mg by mouth daily.   omeprazole 40 MG capsule Commonly known as:  PRILOSEC Take 40 mg by mouth 2 (two) times daily.   pravastatin 40 MG tablet Commonly known as:  PRAVACHOL Take 40 mg by mouth at bedtime.   zolpidem 5 MG tablet Commonly known as:  AMBIEN Take 5 mg by mouth at bedtime as needed for sleep.        DISCHARGE INSTRUCTIONS:   DIET:  Cardiac diet DISCHARGE CONDITION:  Stable ACTIVITY:  Activity as tolerated OXYGEN:  Home Oxygen: No.  Oxygen Delivery: room air DISCHARGE LOCATION:  home   If you experience worsening of your admission symptoms, develop shortness of breath, life threatening emergency, suicidal or homicidal thoughts you must seek medical attention immediately by calling 911 or calling your MD immediately  if symptoms less severe.  You Must read complete instructions/literature along with all the possible adverse reactions/side effects for all the Medicines you take and that have been prescribed to you. Take any new Medicines after you have completely understood and accpet all the possible adverse reactions/side effects.   Please note  You were cared for by a hospitalist during your hospital stay. If you have any questions about your discharge medications or the care you received while you were in the hospital after you are discharged, you can call the unit and asked to speak with the hospitalist on call if the hospitalist that took care of you is not available. Once you are discharged, your primary care physician will handle any further medical issues. Please note that NO REFILLS for any discharge medications will be authorized once you are discharged, as it is imperative that you return to  your primary care physician (or establish a relationship with a primary care physician if you do not have one) for your aftercare needs so that they can reassess your need for medications and monitor your lab values.    On the day of Discharge:  VITAL SIGNS:  Blood pressure 118/69, pulse 68, temperature 97.7 F (36.5 C), temperature source Oral, resp. rate 20, height 5\' 5"  (1.651 m), weight 77.1 kg, SpO2 95 %. PHYSICAL EXAMINATION:  GENERAL:  85 y.o.-year-old patient lying in the bed with no acute distress.  EYES: Pupils equal, round, reactive to light and accommodation. No scleral icterus. Extraocular muscles intact.  HEENT: Head atraumatic, normocephalic. Oropharynx and nasopharynx clear.  NECK:  Supple, no jugular venous distention. No thyroid enlargement, no tenderness.  LUNGS: Normal breath sounds bilaterally, no wheezing, rales,rhonchi or crepitation. No use of accessory muscles of respiration.  CARDIOVASCULAR: S1, S2 normal. No murmurs, rubs, or gallops.  ABDOMEN: Soft, non-tender, non-distended. Bowel sounds present. No organomegaly or mass.  EXTREMITIES: No pedal edema, cyanosis, or clubbing.  NEUROLOGIC: Cranial nerves II through XII are intact. Muscle strength 5/5 in all extremities. Sensation intact. Gait not  checked.  PSYCHIATRIC: The patient is alert and oriented x 3.  SKIN: No obvious rash, lesion, or ulcer.  DATA REVIEW:   CBC Recent Labs  Lab 09/29/18 0421  WBC 5.6  HGB 10.9*  HCT 34.5*  PLT 173    Chemistries  Recent Labs  Lab 09/28/18 0946  09/30/18 0610  NA 137   < > 141  K 3.8   < > 4.0  CL 104   < > 109  CO2 25   < > 29  GLUCOSE 162*   < > 93  BUN 34*   < > 9  CREATININE 0.69   < > 0.59  CALCIUM 8.8*   < > 8.7*  MG  --   --  2.1  AST 26  --   --   ALT 17  --   --   ALKPHOS 57  --   --   BILITOT 1.2  --   --    < > = values in this interval not displayed.     Microbiology Results  Results for orders placed or performed during the hospital  encounter of 10/14/16  Urine culture     Status: Abnormal   Collection Time: 10/14/16  2:15 PM  Result Value Ref Range Status   Specimen Description URINE, CLEAN CATCH  Final   Special Requests NONE  Final   Culture >=100,000 COLONIES/mL ESCHERICHIA COLI (A)  Final   Report Status 10/17/2016 FINAL  Final   Organism ID, Bacteria ESCHERICHIA COLI (A)  Final      Susceptibility   Escherichia coli - MIC*    AMPICILLIN >=32 RESISTANT Resistant     CEFAZOLIN <=4 SENSITIVE Sensitive     CEFTRIAXONE <=1 SENSITIVE Sensitive     CIPROFLOXACIN 1 SENSITIVE Sensitive     GENTAMICIN >=16 RESISTANT Resistant     IMIPENEM <=0.25 SENSITIVE Sensitive     NITROFURANTOIN <=16 SENSITIVE Sensitive     TRIMETH/SULFA >=320 RESISTANT Resistant     AMPICILLIN/SULBACTAM >=32 RESISTANT Resistant     PIP/TAZO <=4 SENSITIVE Sensitive     Extended ESBL NEGATIVE Sensitive     * >=100,000 COLONIES/mL ESCHERICHIA COLI    RADIOLOGY:  No results found.   Management plans discussed with the patient, family and they are in agreement.  CODE STATUS: DNR   TOTAL TIME TAKING CARE OF THIS PATIENT: 37 minutes.    Corgan Mormile M.D on 10/01/2018 at 11:28 AM  Between 7am to 6pm - Pager - [phone]  After 6pm go to [email or web address] - Social research officer, government  [facility] [place]  Office  [phone]  CC: Primary care physician; Corky Downs, MD   Note: This dictation was prepared with Dragon dictation along with smaller phrase technology. Any transcriptional errors that result from this process are unintentional.

## 2018-10-06 DIAGNOSIS — R911 Solitary pulmonary nodule: Secondary | ICD-10-CM | POA: Diagnosis not present

## 2018-10-09 DIAGNOSIS — E785 Hyperlipidemia, unspecified: Secondary | ICD-10-CM | POA: Diagnosis not present

## 2018-10-09 DIAGNOSIS — G4700 Insomnia, unspecified: Secondary | ICD-10-CM | POA: Diagnosis not present

## 2018-10-09 DIAGNOSIS — K219 Gastro-esophageal reflux disease without esophagitis: Secondary | ICD-10-CM | POA: Diagnosis not present

## 2018-10-09 DIAGNOSIS — J208 Acute bronchitis due to other specified organisms: Secondary | ICD-10-CM | POA: Diagnosis not present

## 2018-10-09 DIAGNOSIS — E039 Hypothyroidism, unspecified: Secondary | ICD-10-CM | POA: Diagnosis not present

## 2018-10-09 DIAGNOSIS — K56600 Partial intestinal obstruction, unspecified as to cause: Secondary | ICD-10-CM | POA: Diagnosis not present

## 2018-10-20 DIAGNOSIS — R911 Solitary pulmonary nodule: Secondary | ICD-10-CM | POA: Diagnosis not present

## 2018-10-23 DIAGNOSIS — R0609 Other forms of dyspnea: Secondary | ICD-10-CM | POA: Diagnosis not present

## 2018-10-23 DIAGNOSIS — R0789 Other chest pain: Secondary | ICD-10-CM | POA: Diagnosis not present

## 2018-10-24 DIAGNOSIS — K219 Gastro-esophageal reflux disease without esophagitis: Secondary | ICD-10-CM | POA: Diagnosis not present

## 2018-10-24 DIAGNOSIS — E039 Hypothyroidism, unspecified: Secondary | ICD-10-CM | POA: Diagnosis not present

## 2018-10-24 DIAGNOSIS — G4700 Insomnia, unspecified: Secondary | ICD-10-CM | POA: Diagnosis not present

## 2018-10-24 DIAGNOSIS — K56600 Partial intestinal obstruction, unspecified as to cause: Secondary | ICD-10-CM | POA: Diagnosis not present

## 2018-10-24 DIAGNOSIS — J208 Acute bronchitis due to other specified organisms: Secondary | ICD-10-CM | POA: Diagnosis not present

## 2018-10-24 DIAGNOSIS — E785 Hyperlipidemia, unspecified: Secondary | ICD-10-CM | POA: Diagnosis not present

## 2018-10-30 DIAGNOSIS — K56600 Partial intestinal obstruction, unspecified as to cause: Secondary | ICD-10-CM | POA: Diagnosis not present

## 2018-10-30 DIAGNOSIS — G4700 Insomnia, unspecified: Secondary | ICD-10-CM | POA: Diagnosis not present

## 2018-10-30 DIAGNOSIS — E039 Hypothyroidism, unspecified: Secondary | ICD-10-CM | POA: Diagnosis not present

## 2018-10-30 DIAGNOSIS — K219 Gastro-esophageal reflux disease without esophagitis: Secondary | ICD-10-CM | POA: Diagnosis not present

## 2018-10-30 DIAGNOSIS — E785 Hyperlipidemia, unspecified: Secondary | ICD-10-CM | POA: Diagnosis not present

## 2018-10-30 DIAGNOSIS — J208 Acute bronchitis due to other specified organisms: Secondary | ICD-10-CM | POA: Diagnosis not present

## 2018-11-04 ENCOUNTER — Ambulatory Visit
Admission: RE | Admit: 2018-11-04 | Discharge: 2018-11-04 | Disposition: A | Discharge status: Home / Self Care | Payer: Medicare Other | Source: Ambulatory Visit | Attending: Internal Medicine | Admitting: Internal Medicine

## 2018-11-04 ENCOUNTER — Other Ambulatory Visit: Payer: Self-pay

## 2018-11-04 DIAGNOSIS — Z1231 Encounter for screening mammogram for malignant neoplasm of breast: Secondary | ICD-10-CM

## 2018-11-05 DIAGNOSIS — R0602 Shortness of breath: Secondary | ICD-10-CM | POA: Diagnosis not present

## 2018-11-11 DIAGNOSIS — G4733 Obstructive sleep apnea (adult) (pediatric): Secondary | ICD-10-CM | POA: Diagnosis not present

## 2018-11-20 DIAGNOSIS — D367 Benign neoplasm of other specified sites: Secondary | ICD-10-CM | POA: Diagnosis not present

## 2018-11-20 DIAGNOSIS — M1711 Unilateral primary osteoarthritis, right knee: Secondary | ICD-10-CM | POA: Diagnosis not present

## 2018-12-19 DIAGNOSIS — R269 Unspecified abnormalities of gait and mobility: Secondary | ICD-10-CM | POA: Diagnosis not present

## 2018-12-19 DIAGNOSIS — J40 Bronchitis, not specified as acute or chronic: Secondary | ICD-10-CM | POA: Diagnosis not present

## 2018-12-25 DIAGNOSIS — M170 Bilateral primary osteoarthritis of knee: Secondary | ICD-10-CM | POA: Diagnosis not present

## 2018-12-25 DIAGNOSIS — M25559 Pain in unspecified hip: Secondary | ICD-10-CM | POA: Insufficient documentation

## 2018-12-30 DIAGNOSIS — M170 Bilateral primary osteoarthritis of knee: Secondary | ICD-10-CM | POA: Diagnosis not present

## 2019-01-05 DIAGNOSIS — M549 Dorsalgia, unspecified: Secondary | ICD-10-CM | POA: Diagnosis not present

## 2019-01-05 DIAGNOSIS — M8580 Other specified disorders of bone density and structure, unspecified site: Secondary | ICD-10-CM | POA: Diagnosis not present

## 2019-01-28 DIAGNOSIS — G4733 Obstructive sleep apnea (adult) (pediatric): Secondary | ICD-10-CM | POA: Diagnosis not present

## 2019-01-28 DIAGNOSIS — R0789 Other chest pain: Secondary | ICD-10-CM | POA: Diagnosis not present

## 2019-02-02 DIAGNOSIS — J209 Acute bronchitis, unspecified: Secondary | ICD-10-CM | POA: Diagnosis not present

## 2019-02-02 DIAGNOSIS — R911 Solitary pulmonary nodule: Secondary | ICD-10-CM | POA: Diagnosis not present

## 2019-02-02 DIAGNOSIS — G450 Vertebro-basilar artery syndrome: Secondary | ICD-10-CM | POA: Diagnosis not present

## 2019-02-04 IMAGING — US US EXTREM LOW VENOUS BILAT
1 series · 13 of 24 positions shown · non-contrast
Comparison: Prior duplex venous ultrasound 03/05/2017

CLINICAL DATA: 87-year-old female with a 4 day history of bilateral
calf edema



[Series 1: us extrem low venous bilat · 0.07mm/px · 13 of 73 slices shown]
[im 1/73]
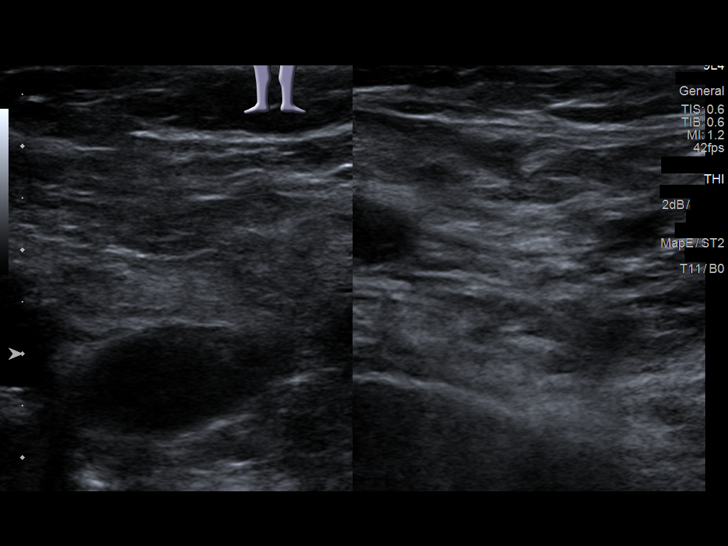
[im 7/73]
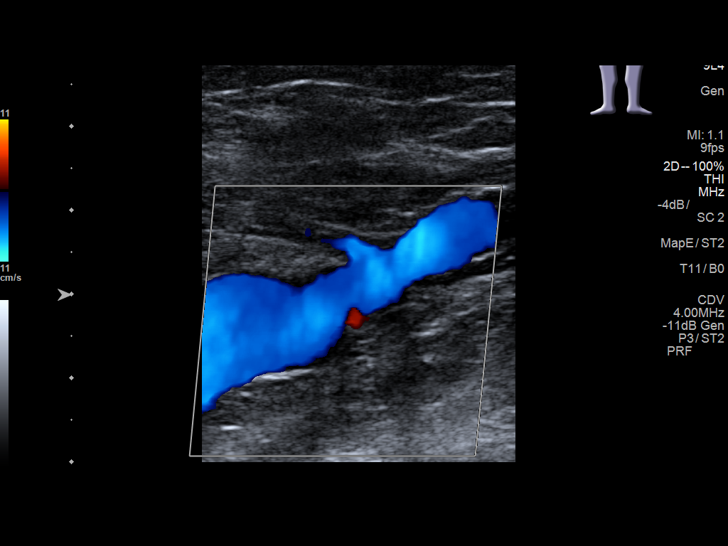
[im 13/73]
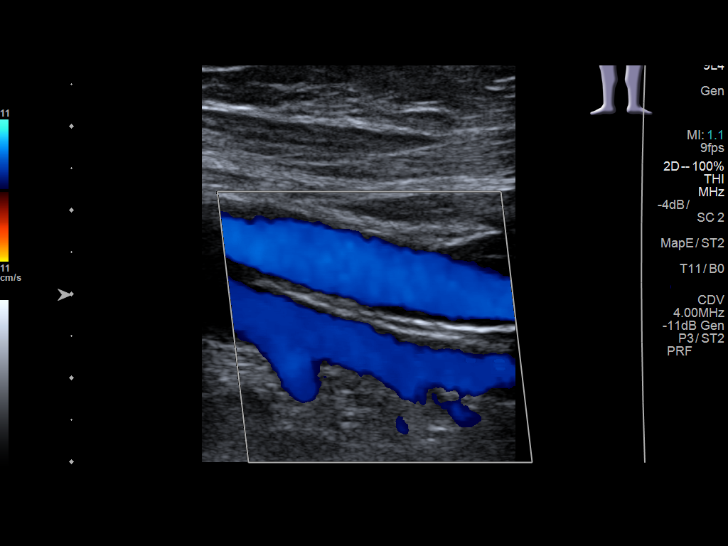
[im 19/73]
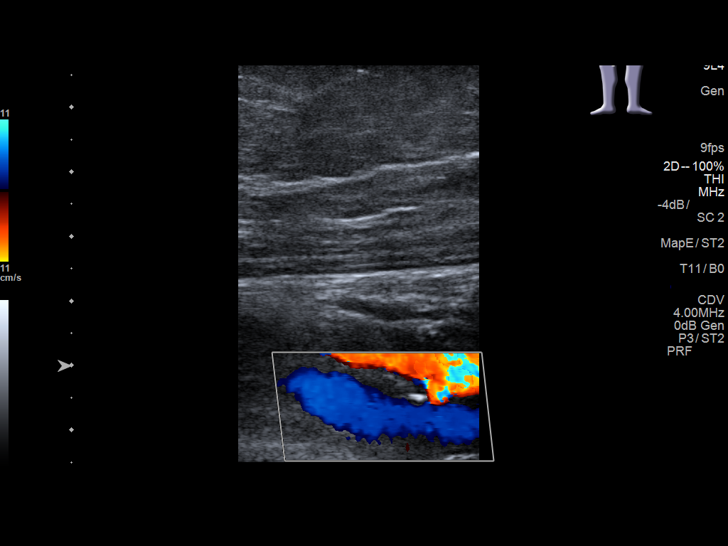
[im 26/73]
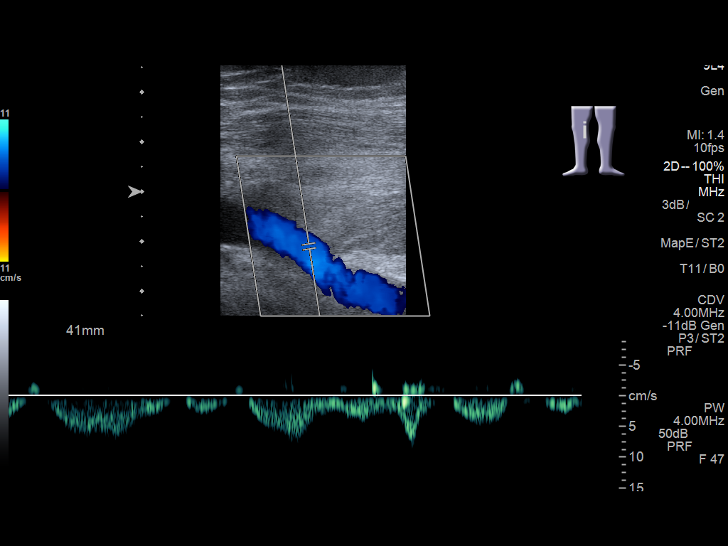
[im 32/73]
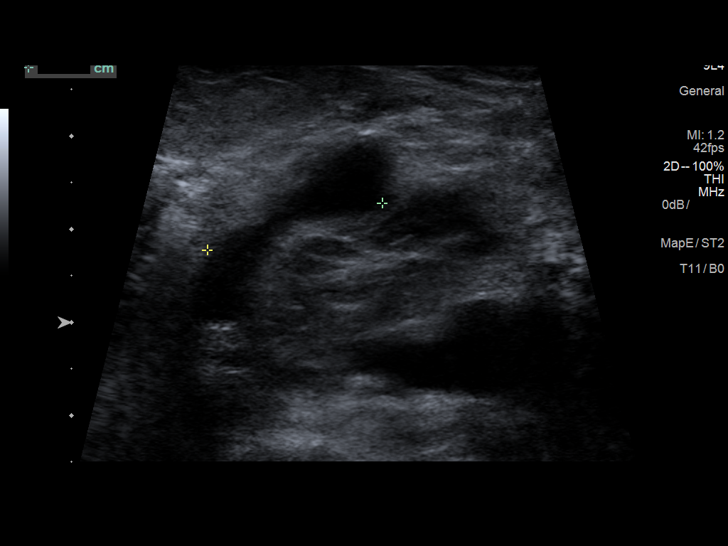
[im 38/73]
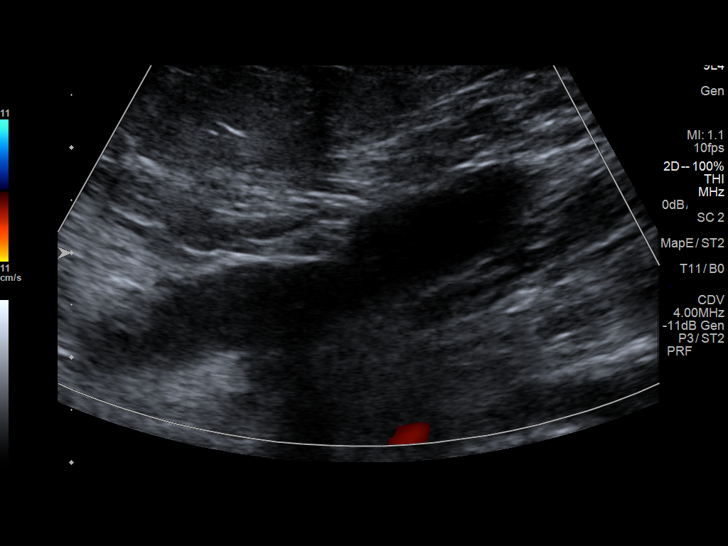
[im 41/73]
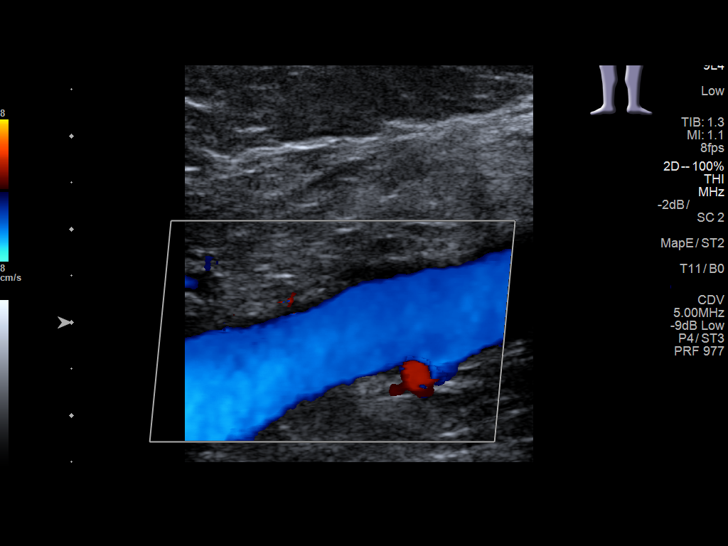
[im 47/73]
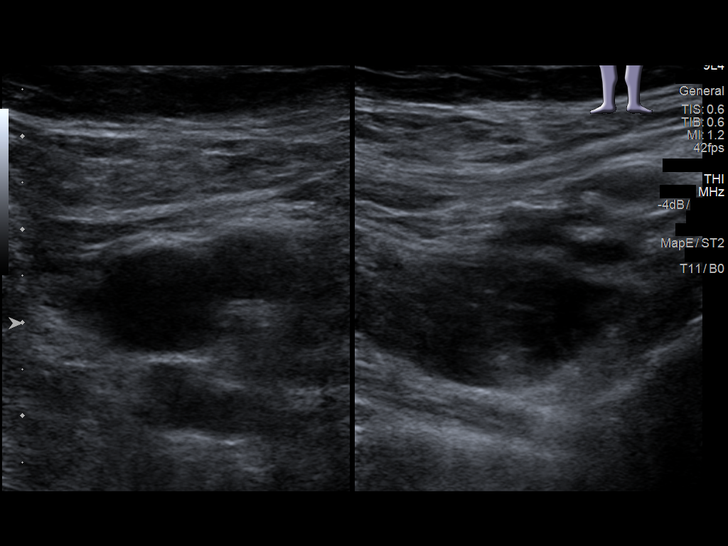
[im 54/73]
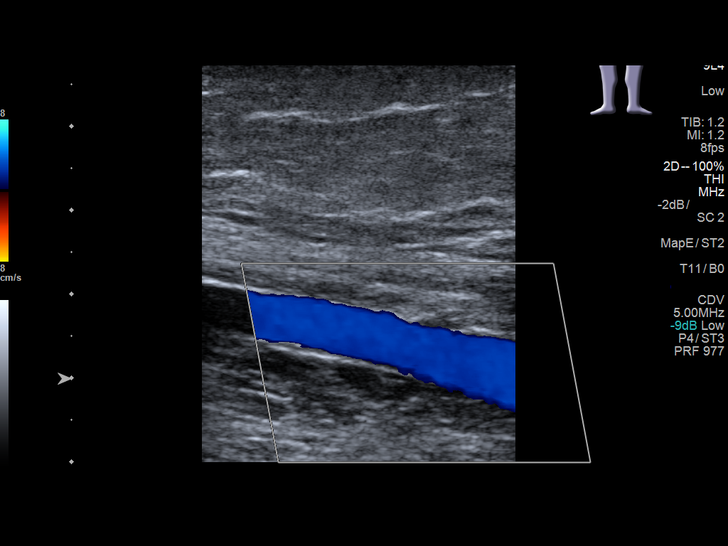
[im 60/73]
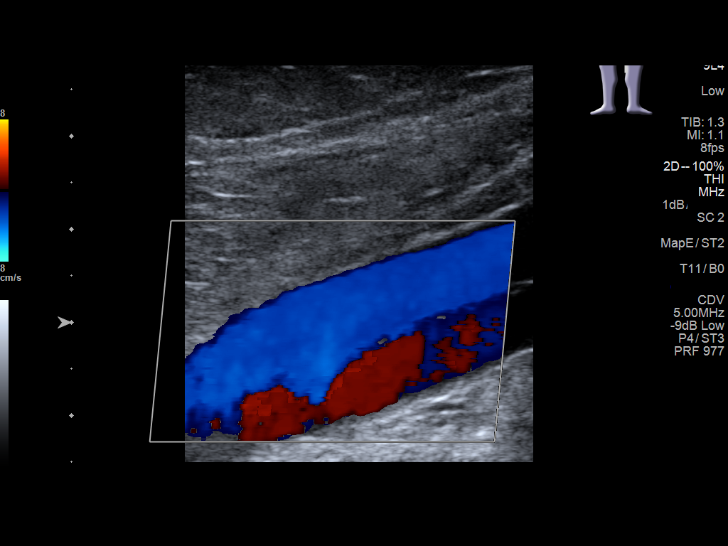
[im 66/73]
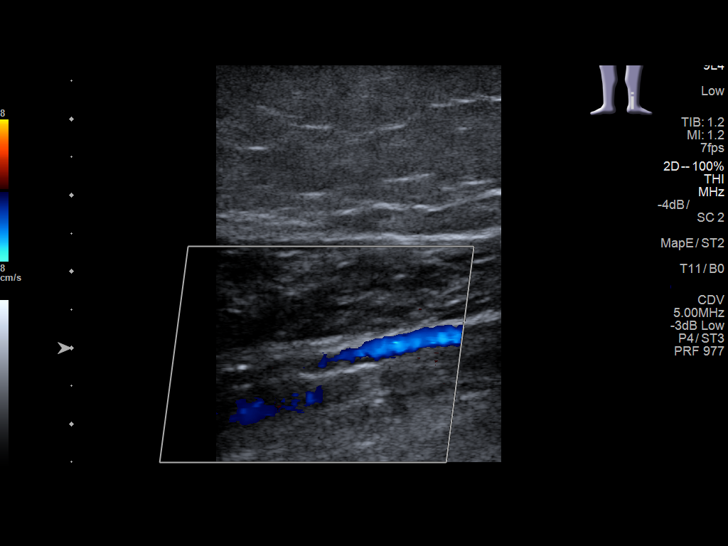
[im 73/73]
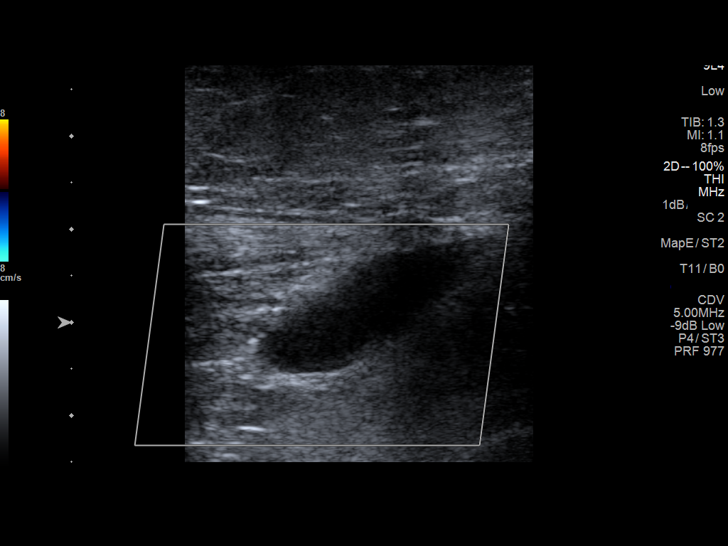

[13 of 24 positions shown; findings below may reference images not displayed]

FINDINGS: RIGHT LOWER EXTREMITY

Common Femoral Vein: No evidence of thrombus. Normal
compressibility, respiratory phasicity and response to augmentation.

Saphenofemoral Junction: No evidence of thrombus. Normal
compressibility and flow on color Doppler imaging.

Profunda Femoral Vein: No evidence of thrombus. Normal
compressibility and flow on color Doppler imaging.

Femoral Vein: No evidence of thrombus. Normal compressibility,
respiratory phasicity and response to augmentation.

Popliteal Vein: No evidence of thrombus. Normal compressibility,
respiratory phasicity and response to augmentation.

Calf Veins: No evidence of thrombus. Normal compressibility and flow
on color Doppler imaging.

Superficial Great Saphenous Vein: No evidence of thrombus. Normal
compressibility.

Venous Reflux:  None.

Other Findings: Mildly complex fluid collection in the popliteal
fossa measures approximately 4.2 x 0.9 x 1.9 cm consistent with a
small Baker's cyst.

LEFT LOWER EXTREMITY

Common Femoral Vein: No evidence of thrombus. Normal
compressibility, respiratory phasicity and response to augmentation.

Saphenofemoral Junction: No evidence of thrombus. Normal
compressibility and flow on color Doppler imaging.

Profunda Femoral Vein: No evidence of thrombus. Normal
compressibility and flow on color Doppler imaging.

Femoral Vein: No evidence of thrombus. Normal compressibility,
respiratory phasicity and response to augmentation.

Popliteal Vein: No evidence of thrombus. Normal compressibility,
respiratory phasicity and response to augmentation.

Calf Veins: No evidence of thrombus. Normal compressibility and flow
on color Doppler imaging.

Superficial Great Saphenous Vein: No evidence of thrombus. Normal
compressibility.

Venous Reflux:  None.

Other Findings: Small fluid collection in the left popliteal fossa
measures 2.7 x 0.9 x 1.7 cm consistent with a small Baker's cyst.
IMPRESSION: 1. No evidence of deep venous thrombosis in either lower extremity.
2. Small bilateral Baker's cysts larger on the right than the left.

## 2019-02-19 DIAGNOSIS — S8391XA Sprain of unspecified site of right knee, initial encounter: Secondary | ICD-10-CM | POA: Diagnosis not present

## 2019-02-24 DIAGNOSIS — G4733 Obstructive sleep apnea (adult) (pediatric): Secondary | ICD-10-CM | POA: Diagnosis not present

## 2019-04-06 DIAGNOSIS — M1711 Unilateral primary osteoarthritis, right knee: Secondary | ICD-10-CM | POA: Diagnosis not present

## 2019-04-06 DIAGNOSIS — G450 Vertebro-basilar artery syndrome: Secondary | ICD-10-CM | POA: Diagnosis not present

## 2019-04-06 DIAGNOSIS — M549 Dorsalgia, unspecified: Secondary | ICD-10-CM | POA: Diagnosis not present

## 2019-04-06 DIAGNOSIS — Z23 Encounter for immunization: Secondary | ICD-10-CM | POA: Diagnosis not present

## 2019-04-24 ENCOUNTER — Emergency Department
Admission: EM | Admit: 2019-04-24 | Discharge: 2019-04-24 | Disposition: A | Discharge status: Home / Self Care | Payer: Medicare Other | Attending: Emergency Medicine | Admitting: Emergency Medicine

## 2019-04-24 ENCOUNTER — Encounter: Payer: Self-pay | Admitting: Emergency Medicine

## 2019-04-24 ENCOUNTER — Other Ambulatory Visit: Payer: Self-pay

## 2019-04-24 DIAGNOSIS — Z5321 Procedure and treatment not carried out due to patient leaving prior to being seen by health care provider: Secondary | ICD-10-CM | POA: Insufficient documentation

## 2019-04-24 DIAGNOSIS — R609 Edema, unspecified: Secondary | ICD-10-CM | POA: Diagnosis not present

## 2019-04-24 DIAGNOSIS — R600 Localized edema: Secondary | ICD-10-CM | POA: Diagnosis not present

## 2019-04-24 LAB — BASIC METABOLIC PANEL
Anion gap: 11 (ref 5–15)
BUN: 25 mg/dL — ABNORMAL HIGH (ref 8–23)
CO2: 26 mmol/L (ref 22–32)
Calcium: 9.8 mg/dL (ref 8.9–10.3)
Chloride: 104 mmol/L (ref 98–111)
Creatinine, Ser: 0.62 mg/dL (ref 0.44–1.00)
GFR calc Af Amer: >60 mL/min (ref >60)
GFR calc non Af Amer: >60 mL/min (ref >60)
Glucose, Bld: 95 mg/dL (ref 70–99)
Potassium: 3.7 mmol/L (ref 3.5–5.1)
Sodium: 141 mmol/L (ref 135–145)

## 2019-04-24 LAB — CBC
HCT: 39.4 % (ref 36.0–46.0)
Hemoglobin: 12.8 g/dL (ref 12.0–15.0)
MCH: 29.4 pg (ref 26.0–34.0)
MCHC: 32.5 g/dL (ref 30.0–36.0)
MCV: 90.6 fL (ref 80.0–100.0)
Platelets: 241 10*3/uL (ref 150–400)
RBC: 4.35 MIL/uL (ref 3.87–5.11)
RDW: 13.1 % (ref 11.5–15.5)
WBC: 11.1 10*3/uL — ABNORMAL HIGH (ref 4.0–10.5)
nRBC: 0 % (ref 0.0–0.2)

## 2019-04-24 LAB — BRAIN NATRIURETIC PEPTIDE: B Natriuretic Peptide: 79 pg/mL (ref 0.0–100.0)

## 2019-04-24 MED ORDER — SODIUM CHLORIDE 0.9% FLUSH: 3.0000 mL | Freq: Once | INTRAVENOUS | Status: DC

## 2019-04-24 NOTE — ED Triage Notes (Signed)
Pt to ER with c/o bilateral leg swelling for last 3-4 days.  States she has taken "a fluid pill" without relief.  Pt states was sent for [facility] for evaluation for clots vs CHF.

## 2019-04-25 ENCOUNTER — Emergency Department
Admission: EM | Admit: 2019-04-25 | Discharge: 2019-04-25 | Disposition: A | Discharge status: Home / Self Care | Payer: Medicare Other | Attending: Emergency Medicine | Admitting: Emergency Medicine

## 2019-04-25 ENCOUNTER — Emergency Department: Payer: Medicare Other

## 2019-04-25 ENCOUNTER — Encounter: Payer: Self-pay | Admitting: Emergency Medicine

## 2019-04-25 ENCOUNTER — Other Ambulatory Visit: Payer: Self-pay

## 2019-04-25 DIAGNOSIS — Z79899 Other long term (current) drug therapy: Secondary | ICD-10-CM | POA: Diagnosis not present

## 2019-04-25 DIAGNOSIS — I10 Essential (primary) hypertension: Secondary | ICD-10-CM | POA: Insufficient documentation

## 2019-04-25 DIAGNOSIS — Z7982 Long term (current) use of aspirin: Secondary | ICD-10-CM | POA: Insufficient documentation

## 2019-04-25 DIAGNOSIS — M7989 Other specified soft tissue disorders: Secondary | ICD-10-CM | POA: Diagnosis present

## 2019-04-25 DIAGNOSIS — K5730 Diverticulosis of large intestine without perforation or abscess without bleeding: Secondary | ICD-10-CM | POA: Diagnosis not present

## 2019-04-25 DIAGNOSIS — R600 Localized edema: Secondary | ICD-10-CM | POA: Diagnosis not present

## 2019-04-25 DIAGNOSIS — R609 Edema, unspecified: Secondary | ICD-10-CM

## 2019-04-25 DIAGNOSIS — E039 Hypothyroidism, unspecified: Secondary | ICD-10-CM | POA: Insufficient documentation

## 2019-04-25 DIAGNOSIS — Z7902 Long term (current) use of antithrombotics/antiplatelets: Secondary | ICD-10-CM | POA: Diagnosis not present

## 2019-04-25 DIAGNOSIS — J449 Chronic obstructive pulmonary disease, unspecified: Secondary | ICD-10-CM | POA: Diagnosis not present

## 2019-04-25 LAB — URINALYSIS, ROUTINE W REFLEX MICROSCOPIC
Bilirubin Urine: NEGATIVE
Glucose, UA: NEGATIVE mg/dL
Hgb urine dipstick: NEGATIVE
Ketones, ur: 5 mg/dL — AB
Leukocytes,Ua: NEGATIVE
Nitrite: NEGATIVE
Protein, ur: NEGATIVE mg/dL
Specific Gravity, Urine: 1.01 (ref 1.005–1.030)
pH: 7 (ref 5.0–8.0)

## 2019-04-25 LAB — HEPATIC FUNCTION PANEL
ALT: 12 U/L (ref 0–44)
AST: 19 U/L (ref 15–41)
Albumin: 4.1 g/dL (ref 3.5–5.0)
Alkaline Phosphatase: 52 U/L (ref 38–126)
Bilirubin, Direct: <0.1 mg/dL (ref 0.0–0.2)
Total Bilirubin: 0.7 mg/dL (ref 0.3–1.2)
Total Protein: 6.8 g/dL (ref 6.5–8.1)

## 2019-04-25 MED ORDER — IOHEXOL 350 MG/ML SOLN
125.0000 mL | Freq: Once | INTRAVENOUS | Status: AC | PRN
  Administered 2019-04-25: 125 mL via INTRAVENOUS

## 2019-04-25 NOTE — ED Triage Notes (Signed)
Pt to ED via POV, pt state that she is having bilateral leg swelling for the last 4-5 days. Pt came last night but left before she was seen. Pt states that she is taking a fluid pill but does not take them like she is supposed to. Pt is in NAD at this time.

## 2019-04-25 NOTE — Discharge Instructions (Addendum)
Your ultrasounds were negative for blood clot and your CT scan was negative.  You can take your Lasix 20 mg twice daily for 3 days and see if this has any improvement.  Otherwise continue to wear compression socks and elevate your legs.  You should follow-up with your primary care doctor for echocardiogram and further discussion.  You will probably need a recheck on your potassium in 1 week to ensure that you are not getting hypokalemic.  Return to the ER for shortness of breath or any other concerns.  1. The IVC and pelvic venous systems appear widely patent without evidence of central venous occlusion to suggest the etiology of the patient's lower extremity swelling. 2. Cardiomegaly. Coronary artery calcifications. Aortic Atherosclerosis (ICD10-I70.0). 3. Colonic diverticulosis without evidence of superimposed acute diverticulitis. 4. Moderate to large colonic stool burden without evidence of enteric obstruction. 5. Moderate-sized hiatal hernia.

## 2019-04-25 NOTE — ED Notes (Signed)
Pt verbalized understanding of discharge instructions. NAD at this time. 

## 2019-04-25 NOTE — ED Provider Notes (Signed)
[facility] Emergency Department Provider Note  ____________________________________________   First MD Initiated Contact with Patient 04/25/19 1055     (approximate)  I have reviewed the triage vital signs and the nursing notes.   HISTORY  Chief Complaint Leg Swelling    HPI Abigail Strong is a 85 y.o. female with hyperlipidemia, hypothyroidism who presents with leg swelling.  Patient endorses having been on Lasix previously but it was discontinued a long time ago due to causing overactive bladder.  Patient said over the past 1 week she is developed bilateral leg swelling that is moderate, constant, not better with taking her Lasix for the past 3 days, nothing makes it worse.  She denies any fevers.  Maybe some mild shortness of breath.  No chest pain.     Past Medical History:  Diagnosis Date  . Depression   . GERD (gastroesophageal reflux disease)   . Hyperlipidemia   . Hypothyroidism   . Insomnia     Patient Active Problem List   Diagnosis Date Noted  . Partial small bowel obstruction ([place]) 09/28/2018  . Arthritis of knee 02/19/2017  . COPD exacerbation ([place]) 09/27/2016  . Pneumonia 09/27/2016  . Influenza A 09/27/2016  . Prediabetes 09/27/2016  . Leukocytosis 09/27/2016  . Chronic fatigue, unspecified 09/27/2016  . Musculoskeletal chest pain 09/27/2016  . Acute respiratory failure with hypoxia ([place]) 09/22/2016  . Bronchitis 09/09/2016  . Osteoarthritis of knee 03/23/2016  . Diverticulitis 03/23/2015  . Depression 01/01/2014  . GERD (gastroesophageal reflux disease) 01/01/2014  . Hyperlipidemia, unspecified 01/01/2014  . Hypothyroidism, unspecified 01/01/2014  . OA (osteoarthritis) 01/01/2014  . HYPERTENSION 12/08/2009  . COUGH 12/08/2009    Past Surgical History:  Procedure Laterality Date  . ABDOMINAL HYSTERECTOMY    . BREAST BIOPSY Bilateral 90's   benign  . cataracts    . COLON SURGERY     partial colectomy  .  intestinal blockage    . NISSEN FUNDOPLICATION      Prior to Admission medications   Medication Sig Start Date End Date Taking? Authorizing Provider  acetaminophen (TYLENOL) 500 MG tablet Take 1,000 mg by mouth every 6 (six) hours as needed.    [provider]  albuterol (PROVENTIL) (2.5 MG/3ML) 0.083% nebulizer solution Take 3 mLs (2.5 mg total) by nebulization every 4 (four) hours as needed for wheezing or shortness of breath. 09/27/16   Theodoro Grist, MD  aspirin 81 MG chewable tablet Chew 81 mg by mouth daily.    [provider]  azithromycin (ZITHROMAX) 250 MG tablet Take 2 tablets (500 mg total) by mouth daily. 10/01/18   Stark Jock Jude, MD  clopidogrel (PLAVIX) 75 MG tablet Take 75 mg by mouth daily.     [provider]  furosemide (LASIX) 20 MG tablet Take 20 mg by mouth daily.    [provider]  levothyroxine (SYNTHROID, LEVOTHROID) 125 MCG tablet Take 125 mcg by mouth daily before breakfast.    [provider]  meloxicam (MOBIC) 7.5 MG tablet Take 7.5 mg by mouth daily.    [provider]  omeprazole (PRILOSEC) 40 MG capsule Take 40 mg by mouth 2 (two) times daily.    [provider]  pravastatin (PRAVACHOL) 40 MG tablet Take 40 mg by mouth at bedtime.    [provider]  zolpidem (AMBIEN) 5 MG tablet Take 5 mg by mouth at bedtime as needed for sleep.    [provider]    Allergies Sulfa antibiotics and  Sulfonamide derivatives  Family History  Problem Relation Age of Onset  . Cervical cancer Mother   . Breast cancer Maternal Aunt     Social History Social History   Tobacco Use  . Smoking status: Never Smoker  . Smokeless tobacco: Never Used  Substance Use Topics  . Alcohol use: No  . Drug use: No      Review of Systems Constitutional: No fever/chills Eyes: No visual changes. ENT: No sore throat. Cardiovascular: Denies chest pain. Respiratory: Mild shortness of breath Gastrointestinal:  No abdominal pain.  No nausea, no vomiting.  No diarrhea.  No constipation. Genitourinary: Negative for dysuria. Musculoskeletal: Negative for back pain.  +Leg swelling Skin: Negative for rash. Neurological: Negative for headaches, focal weakness or numbness. All other ROS negative ____________________________________________   PHYSICAL EXAM:  VITAL SIGNS: ED Triage Vitals  Enc Vitals Group     BP 04/25/19 0836 (!) 109/48     Pulse Rate 04/25/19 0836 (!) 38     Resp 04/25/19 0836 16     Temp 04/25/19 0848 97.9 F (36.6 C)     Temp Source 04/25/19 0848 Oral     SpO2 04/25/19 0836 96 %     Weight 04/25/19 0849 150 lb (68 kg)     Height 04/25/19 0849 5\' 5"  (1.651 m)     Head Circumference --      Peak Flow --      Pain Score 04/25/19 0835 0     Pain Loc --      Pain Edu? --      Excl. in [place]? --     Constitutional: Alert and oriented. Well appearing and in no acute distress. Eyes: Conjunctivae are normal. EOMI. Head: Atraumatic. Nose: No congestion/rhinnorhea. Mouth/Throat: Mucous membranes are moist.   Neck: No stridor. Trachea Midline. FROM Cardiovascular: Normal rate, regular rhythm. Grossly normal heart sounds.  Good peripheral circulation. Respiratory: Normal respiratory effort.  No retractions. Lungs CTAB. Gastrointestinal: Soft and nontender. No distention. No abdominal bruits.  Musculoskeletal: 1+ edema bilaterally no joint effusions. Neurologic:  Normal speech and language. No gross focal neurologic deficits are appreciated.  Skin:  Skin is warm, dry and intact. No rash noted. Psychiatric: Mood and affect are normal. Speech and behavior are normal. GU: Deferred   ____________________________________________   LABS (all labs ordered are listed, but only abnormal results are displayed)  Labs Reviewed  URINALYSIS, ROUTINE W REFLEX MICROSCOPIC - Abnormal; Notable for the following components:      Result Value   Color, Urine STRAW (*)    APPearance CLEAR (*)     Ketones, ur 5 (*)    All other components within normal limits  HEPATIC FUNCTION PANEL   ____________________________________________   ED ECG REPORT I, Vanessa Hamilton, the attending physician, personally viewed and interpreted this ECG.  EKG is sinus rate of 75, no ST elevation, no T wave inversion, does occasionally have PACs. ____________________________________________  RADIOLOGY   Official radiology report(s): US Venous Img Lower Bilateral  Result Date: 04/25/2019 CLINICAL DATA:  Bilateral lower extremity edema for the past 4 days. Evaluate for DVT. EXAM: BILATERAL LOWER EXTREMITY VENOUS DOPPLER ULTRASOUND TECHNIQUE: Gray-scale sonography with graded compression, as well as color Doppler and duplex ultrasound were performed to evaluate the lower extremity deep venous systems from the level of the common femoral vein and including the common femoral, femoral, profunda femoral, popliteal and calf veins including the posterior tibial, peroneal and gastrocnemius veins when visible. The superficial great saphenous vein  was also interrogated. Spectral Doppler was utilized to evaluate flow at rest and with distal augmentation maneuvers in the common femoral, femoral and popliteal veins. COMPARISON:  None. FINDINGS: RIGHT LOWER EXTREMITY Common Femoral Vein: No evidence of thrombus. Normal compressibility, respiratory phasicity and response to augmentation. Saphenofemoral Junction: No evidence of thrombus. Normal compressibility and flow on color Doppler imaging. Profunda Femoral Vein: No evidence of thrombus. Normal compressibility and flow on color Doppler imaging. Femoral Vein: No evidence of thrombus. Normal compressibility, respiratory phasicity and response to augmentation. Popliteal Vein: No evidence of thrombus. Normal compressibility, respiratory phasicity and response to augmentation. Calf Veins: No evidence of thrombus. Normal compressibility and flow on color Doppler imaging. Superficial  Great Saphenous Vein: No evidence of thrombus. Normal compressibility. Venous Reflux:  None. Other Findings: Note is made of an approximately 4.9 x 1.0 x 1.5 cm serpiginous fluid collection with the right popliteal fossa compatible with a Baker cyst. LEFT LOWER EXTREMITY Common Femoral Vein: No evidence of thrombus. Normal compressibility, respiratory phasicity and response to augmentation. Saphenofemoral Junction: No evidence of thrombus. Normal compressibility and flow on color Doppler imaging. Profunda Femoral Vein: No evidence of thrombus. Normal compressibility and flow on color Doppler imaging. Femoral Vein: No evidence of thrombus. Normal compressibility, respiratory phasicity and response to augmentation. Popliteal Vein: No evidence of thrombus. Normal compressibility, respiratory phasicity and response to augmentation. Calf Veins: No evidence of thrombus. Normal compressibility and flow on color Doppler imaging. Superficial Great Saphenous Vein: No evidence of thrombus. Normal compressibility. Venous Reflux:  None. Other Findings: Note is made of an approximately 6.1 x 1.1 x 3.1 cm serpiginous fluid collection with the left popliteal fossa compatible with a Baker cyst. IMPRESSION: 1. No evidence of DVT within either lower extremity. 2. Incidentally noted bilateral Baker's cysts, the left measuring 6.1 cm and the right measuring 4.9 cm. Electronically Signed   By: Sandi Mariscal M.D.   On: 04/25/2019 12:21   Ct Venogram Abd/pel  Result Date: 04/25/2019 CLINICAL DATA:  Acute lower extremity swelling for the past week. Negative venous Doppler ultrasound. Evaluate for pelvic venous occlusion. EXAM: CT ABDOMEN AND PELVIS WITH CONTRAST TECHNIQUE: Multidetector CT imaging of the abdomen and pelvis was performed using the standard protocol following bolus administration of intravenous contrast. CONTRAST:  178mL OMNIPAQUE IOHEXOL 350 MG/ML SOLN COMPARISON:  CT abdomen pelvis - 09/28/2018; bilateral lower extremity  venous Doppler ultrasound-04/25/2019; FINDINGS: Lower chest: Limited visualization of the lower thorax demonstrates minimal dependent subpleural ground-glass atelectasis. No discrete focal airspace opacities. Cardiomegaly. Coronary artery calcifications. No pericardial effusion. Hepatobiliary: Normal hepatic contour. No discrete hepatic lesions. Normal appearance of the gallbladder given degree distention. No radiopaque gallstones. No intra extrahepatic biliary duct dilatation. No ascites. Pancreas: Normal appearance of the pancreas. Spleen: Normal appearance of the spleen. Adrenals/Urinary Tract: There is symmetric enhancement and excretion of the bilateral kidneys. Unchanged approximately 3.1 cm hypoattenuating cyst arising from the posteroinferior pole of the right kidney. Redemonstrated scarring involving the posterosuperior aspect of the right kidney. No definite renal stones this postcontrast examination. No urinary obstruction or perinephric stranding. Normal appearance the bilateral adrenal glands. Normal appearance of the urinary bladder given degree of distention. Stomach/Bowel: Moderate to large-sized hiatal hernia. Colonic diverticulosis, most severely affecting the sigmoid colon, without evidence of superimposed acute diverticulitis. Moderate to large colonic stool burden without evidence of enteric obstruction. Normal appearance of the terminal ileum and retrocecal appendix. No discrete areas of bowel wall thickening. No pneumoperitoneum, pneumatosis or portal venous gas. Vascular/Lymphatic: Moderate amount of  mixed calcified and noncalcified atherosclerotic plaque within a tortuous but normal caliber abdominal aorta, not resulting in hemodynamically significant stenosis. The major branch vessels of the abdominal aorta appear patent on this non CTA examination. The IVC and pelvic venous systems appear widely patent without evidence of discrete filling defect or perivascular stranding. No bulky  retroperitoneal, mesenteric, pelvic or inguinal lymph adenopathy. Reproductive: Post hysterectomy.  No discrete adnexal lesion. Other: Tiny (approximately 1.6 x 1.6 cm mesenteric fat containing periumbilical hernia. There is diastasis of the rectus abdominus musculature caudal to the umbilicus with unchanged eventration of a portion of nondilated loop of small bowel (image 57, series 2, similar to the 09/2018 examination and again not resulting in enteric obstruction. Musculoskeletal: No acute or aggressive osseous abnormalities. Grade 1 anterolisthesis of L4 upon L5 measuring approximately 5 mm. Mild-to-moderate multilevel lumbar spine DDD, worse at L1-L2 and L5-S1 with disc space height loss, endplate irregularity and sclerosis. Degenerative change about the pubic symphysis. IMPRESSION: 1. The IVC and pelvic venous systems appear widely patent without evidence of central venous occlusion to suggest the etiology of the patient's lower extremity swelling. 2. Cardiomegaly. Coronary artery calcifications. Aortic Atherosclerosis (ICD10-I70.0). 3. Colonic diverticulosis without evidence of superimposed acute diverticulitis. 4. Moderate to large colonic stool burden without evidence of enteric obstruction. 5. Moderate-sized hiatal hernia. Electronically Signed   By: Sandi Mariscal M.D.   On: 04/25/2019 14:44    ____________________________________________   PROCEDURES  Procedure(s) performed (including Critical Care):  Procedures   ____________________________________________   INITIAL IMPRESSION / ASSESSMENT AND PLAN / ED COURSE  Abigail Strong was evaluated in Emergency Department on 04/25/2019 for the symptoms described in the history of present illness. She was evaluated in the context of the global COVID-19 pandemic, which necessitated consideration that the patient might be at risk for infection with the SARS-CoV-2 virus that causes COVID-19. Institutional protocols and algorithms that pertain to  the evaluation of patients at risk for COVID-19 are in a state of rapid change based on information released by regulatory bodies including the CDC and federal and state organizations. These policies and algorithms were followed during the patient's care in the ED.     Patient is a well-appearing 85 year old who presents with bilateral leg swelling.  Patient already had labs done yesterday to evaluate for heart failure, kidney dysfunction.  Will add on labs to evaluate for hepatic dysfunction versus nephrotic syndrome although lower suspicion.  Will get ultrasound to evaluate for DVT.  Consider upper abdominal obstruction given the bilateral swelling.  If work-up is negative consider diastolic heart dysfunction versus chronic venous stasis.   Potassium was 3.7.  proBNP was 79  [place] negative  CT negative  Most likely venous stasis vs edema. Pt later endorses lasix in the past has helped. Will trial 20mg  PO lasix BID for three days. Recommend PCP f/u in one week for Cr/K recheck.  Compression socks, elevation and may need outpatient echo if having diastolic HF.  Pt expressed understanding and felt comfortable with the plan.   I discussed the provisional nature of ED diagnosis, the treatment so far, the ongoing plan of care, follow up appointments and return precautions with the patient and any family or support people present. They expressed understanding and agreed with the plan, discharged home.          ____________________________________________   FINAL CLINICAL IMPRESSION(S) / ED DIAGNOSES   Final diagnoses:  Peripheral edema      MEDICATIONS GIVEN DURING THIS VISIT:  Medications  iohexol (OMNIPAQUE) 350 MG/ML injection 125 mL (125 mLs Intravenous Contrast Given 04/25/19 1412)     ED Discharge Orders    None       Note:  This document was prepared using Dragon voice recognition software and may include unintentional dictation errors.   Vanessa Rosston, MD 04/25/19  [phone]

## 2019-05-14 DIAGNOSIS — G450 Vertebro-basilar artery syndrome: Secondary | ICD-10-CM | POA: Diagnosis not present

## 2019-05-14 DIAGNOSIS — M549 Dorsalgia, unspecified: Secondary | ICD-10-CM | POA: Diagnosis not present

## 2019-05-14 DIAGNOSIS — Z87448 Personal history of other diseases of urinary system: Secondary | ICD-10-CM | POA: Diagnosis not present

## 2019-05-28 DIAGNOSIS — M170 Bilateral primary osteoarthritis of knee: Secondary | ICD-10-CM | POA: Diagnosis not present

## 2019-06-02 ENCOUNTER — Ambulatory Visit (INDEPENDENT_AMBULATORY_CARE_PROVIDER_SITE_OTHER): Payer: Medicare Other | Admitting: Cardiology

## 2019-06-02 ENCOUNTER — Other Ambulatory Visit: Payer: Self-pay

## 2019-06-02 ENCOUNTER — Encounter: Payer: Self-pay | Admitting: Cardiology

## 2019-06-02 VITALS — BP 102/69 | HR 77 | Ht 64.5 in | Wt 149.5 lb

## 2019-06-02 DIAGNOSIS — K2100 Gastro-esophageal reflux disease with esophagitis, without bleeding: Secondary | ICD-10-CM | POA: Diagnosis not present

## 2019-06-02 DIAGNOSIS — R600 Localized edema: Secondary | ICD-10-CM | POA: Diagnosis not present

## 2019-06-02 DIAGNOSIS — E7800 Pure hypercholesterolemia, unspecified: Secondary | ICD-10-CM | POA: Diagnosis not present

## 2019-06-02 DIAGNOSIS — I8393 Asymptomatic varicose veins of bilateral lower extremities: Secondary | ICD-10-CM

## 2019-06-02 NOTE — Patient Instructions (Signed)
Medication Instructions:  Your physician recommends that you continue on your current medications as directed. Please refer to the Current Medication list given to you today.  If you need a refill on your cardiac medications before your next appointment, please call your pharmacy.   Lab work: NONE If you have labs (blood work) drawn today and your tests are completely normal, you will receive your results only by: [name] Kitchen MyChart Message (if you have MyChart) OR . A paper copy in the mail If you have any lab test that is abnormal or we need to change your treatment, we will call you to review the results.  Testing/Procedures: Your physician has requested that you have an echocardiogram. Echocardiography is a painless test that uses sound waves to create images of your heart. It provides your doctor with information about the size and shape of your heart and how well your heart's chambers and valves are working. This procedure takes approximately one hour. There are no restrictions for this procedure. You may get an IV, if needed, to receive an ultrasound enhancing agent through to better visualize your heart.    Follow-Up: At [facility], you and your health needs are our priority.  As part of our continuing mission to provide you with exceptional heart care, we have created designated Provider Care Teams.  These Care Teams include your primary Cardiologist (physician) and Advanced Practice Providers (APPs -  Physician Assistants and Nurse Practitioners) who all work together to provide you with the care you need, when you need it. You will need a follow up appointment in 4-6 weeks.  You may see Kate Sable, MD or one of the following Advanced Practice Providers on your designated Care Team:   Murray Hodgkins, NP Christell Faith, PA-C . Marrianne Mood, PA-C   Echocardiogram An echocardiogram is a procedure that uses painless sound waves (ultrasound) to produce an image of the heart.  Images from an echocardiogram can provide important information about:  Signs of coronary artery disease (CAD).  Aneurysm detection. An aneurysm is a weak or damaged part of an artery wall that bulges out from the normal force of blood pumping through the body.  Heart size and shape. Changes in the size or shape of the heart can be associated with certain conditions, including heart failure, aneurysm, and CAD.  Heart muscle function.  Heart valve function.  Signs of a past heart attack.  Fluid buildup around the heart.  Thickening of the heart muscle.  A tumor or infectious growth around the heart valves. Tell a health care provider about:  Any allergies you have.  All medicines you are taking, including vitamins, herbs, eye drops, creams, and over-the-counter medicines.  Any blood disorders you have.  Any surgeries you have had.  Any medical conditions you have.  Whether you are pregnant or may be pregnant. What are the risks? Generally, this is a safe procedure. However, problems may occur, including:  Allergic reaction to dye (contrast) that may be used during the procedure. What happens before the procedure? No specific preparation is needed. You may eat and drink normally. What happens during the procedure?   An IV tube may be inserted into one of your veins.  You may receive contrast through this tube. A contrast is an injection that improves the quality of the pictures from your heart.  A gel will be applied to your chest.  A wand-like tool (transducer) will be moved over your chest. The gel will help to transmit the  sound waves from the transducer.  The sound waves will harmlessly bounce off of your heart to allow the heart images to be captured in real-time motion. The images will be recorded on a computer. The procedure may vary among health care providers and hospitals. What happens after the procedure?  You may return to your normal, everyday life,  including diet, activities, and medicines, unless your health care provider tells you not to do that. Summary  An echocardiogram is a procedure that uses painless sound waves (ultrasound) to produce an image of the heart.  Images from an echocardiogram can provide important information about the size and shape of your heart, heart muscle function, heart valve function, and fluid buildup around your heart.  You do not need to do anything to prepare before this procedure. You may eat and drink normally.  After the echocardiogram is completed, you may return to your normal, everyday life, unless your health care provider tells you not to do that. This information is not intended to replace advice given to you by your health care provider. Make sure you discuss any questions you have with your health care provider. Document Released: 08/03/2000 Document Revised: 11/27/2018 Document Reviewed: 09/08/2016 Elsevier Patient Education  2020 [employer].

## 2019-06-02 NOTE — Progress Notes (Signed)
Cardiology Office Note:    Date:  06/02/2019   ID:  Abigail Strong, DOB 21-Jun-1930, MRN [id number]  PCP:  Corky Downs, MD  Cardiologist:  Debbe Odea, MD  Electrophysiologist:  None   Referring MD: Corky Downs, MD   Chief Complaint  Patient presents with  . other    Edema. Meds reviewed verbally with pt.    History of Present Illness:    Abigail Strong is a 85 y.o. female with a hx of GERD, hyperlipidemia who presents due to lower extremity edema.  Patient states symptoms have been going on for about 3 weeks now.  She went to an outpatient orthopedic urgent care center and was advised to go to the emergency room.  In the emergency room she underwent lower extremity ultrasound which was negative for DVT, CT venous of her pelvis did not show any thrombus.  She was given Lasix daily but has not taken it.  She states not liking to take Lasix because of overactive bladder and frequent urination in the past.  She denies any history of cardiac disease.  Denies chest pain or shortness of breath.  She walks with a walker without any problems.  Past Medical History:  Diagnosis Date  . Depression   . GERD (gastroesophageal reflux disease)   . Hyperlipidemia   . Hypothyroidism   . Insomnia     Past Surgical History:  Procedure Laterality Date  . ABDOMINAL HYSTERECTOMY    . BREAST BIOPSY Bilateral 90's   benign  . cataracts    . COLON SURGERY     partial colectomy  . intestinal blockage    . NISSEN FUNDOPLICATION      Current Medications: Current Meds  Medication Sig  . acetaminophen (TYLENOL) 500 MG tablet Take 1,000 mg by mouth every 6 (six) hours as needed.  [name] Kitchen albuterol (PROVENTIL) (2.5 MG/3ML) 0.083% nebulizer solution Take 3 mLs (2.5 mg total) by nebulization every 4 (four) hours as needed for wheezing or shortness of breath.  [name] Kitchen aspirin 81 MG chewable tablet Chew 81 mg by mouth daily.  . clopidogrel (PLAVIX) 75 MG tablet Take 75 mg by mouth daily.   [name] Kitchen  gabapentin (NEURONTIN) 100 MG capsule Take 100 mg by mouth 3 (three) times daily.  [name] Kitchen levothyroxine (SYNTHROID, LEVOTHROID) 125 MCG tablet Take 125 mcg by mouth daily before breakfast.  . meloxicam (MOBIC) 7.5 MG tablet Take 7.5 mg by mouth daily.  [name] Kitchen omeprazole (PRILOSEC) 40 MG capsule Take 40 mg by mouth 2 (two) times daily.  . pravastatin (PRAVACHOL) 40 MG tablet Take 40 mg by mouth at bedtime.     Allergies:   Sulfa antibiotics and Sulfonamide derivatives   Social History   Socioeconomic History  . Marital status: Married    Spouse name: Not on file  . Number of children: Not on file  . Years of education: Not on file  . Highest education level: Not on file  Occupational History  . Not on file  Social Needs  . Financial resource strain: Not on file  . Food insecurity    Worry: Not on file    Inability: Not on file  . Transportation needs    Medical: Not on file    Non-medical: Not on file  Tobacco Use  . Smoking status: Never Smoker  . Smokeless tobacco: Never Used  Substance and Sexual Activity  . Alcohol use: No  . Drug use: No  . Sexual activity: Never  Lifestyle  . Physical activity  Days per week: Not on file    Minutes per session: Not on file  . Stress: Not on file  Relationships  . Social Musician on phone: Not on file    Gets together: Not on file    Attends religious service: Not on file    Active member of club or organization: Not on file    Attends meetings of clubs or organizations: Not on file    Relationship status: Not on file  Other Topics Concern  . Not on file  Social History Narrative  . Not on file     Family History: The patient's family history includes Breast cancer in her maternal aunt; Cervical cancer in her mother.  ROS:   Please see the history of present illness.     All other systems reviewed and are negative.  EKGs/Labs/Other Studies Reviewed:    The following studies were reviewed today:   EKG:  EKG is   ordered today.  The ekg ordered today demonstrates normal sinus rhythm, occasional PVCs, voltage criteria for LVH.  Recent Labs: 09/30/2018: Magnesium 2.1 04/24/2019: ALT 12; B Natriuretic Peptide 79.0; BUN 25; Creatinine, Ser 0.62; Hemoglobin 12.8; Platelets 241; Potassium 3.7; Sodium 141  Recent Lipid Panel No results found for: CHOL, TRIG, HDL, CHOLHDL, VLDL, LDLCALC, LDLDIRECT  Physical Exam:    VS:  BP 102/69 (BP Location: Right Arm, Patient Position: Sitting, Cuff Size: Normal)   Pulse 77   Ht 5' 4.5" (1.638 m)   Wt 149 lb 8 oz (67.8 kg)   SpO2 98%   BMI 25.27 kg/m     Wt Readings from Last 3 Encounters:  06/02/19 149 lb 8 oz (67.8 kg)  04/25/19 150 lb (68 kg)  09/28/18 170 lb (77.1 kg)     GEN:  Well nourished, well developed in no acute distress, elderly female. HEENT: Normal NECK: No JVD; No carotid bruits LYMPHATICS: No lymphadenopathy CARDIAC: RRR, no murmurs, rubs, gallops RESPIRATORY:  Clear to auscultation without rales, wheezing or rhonchi  ABDOMEN: Soft, non-tender, non-distended MUSCULOSKELETAL:  No edema; No deformity  SKIN: Warm and dry NEUROLOGIC:  Alert and oriented x 3 PSYCHIATRIC:  Normal affect   ASSESSMENT:   Lower extremity edema could be secondary to cardiac dysfunction or lower extremity venous insufficiency.  The latter seems more likely due to the presence of varicose veins on physical exam.  1. Lower extremity edema   2. Varicose veins of both lower extremities, unspecified whether complicated   3. Pure hypercholesterolemia   4. Gastroesophageal reflux disease with esophagitis without hemorrhage    PLAN:    In order of problems listed above:  1. We will get echocardiogram. 2. Patient advised to raise her legs while in a seated position.  She was also advised to use compression stockings and Ace wraps. 3. On statin 4. Patient on a PPI, continue.   Follow-up after echocardiogram.  Total encounter time more than 45 minutes  Greater  than 50% was spent in counseling and coordination of care with the patient    Medication Adjustments/Labs and Tests Ordered: Current medicines are reviewed at length with the patient today.  Concerns regarding medicines are outlined above.  Orders Placed This Encounter  Procedures  . EKG 12-Lead  . ECHOCARDIOGRAM COMPLETE   No orders of the defined types were placed in this encounter.   Patient Instructions  Medication Instructions:  Your physician recommends that you continue on your current medications as directed. Please refer to the  Current Medication list given to you today.  If you need a refill on your cardiac medications before your next appointment, please call your pharmacy.   Lab work: NONE If you have labs (blood work) drawn today and your tests are completely normal, you will receive your results only by: [name] Kitchen MyChart Message (if you have MyChart) OR . A paper copy in the mail If you have any lab test that is abnormal or we need to change your treatment, we will call you to review the results.  Testing/Procedures: Your physician has requested that you have an echocardiogram. Echocardiography is a painless test that uses sound waves to create images of your heart. It provides your doctor with information about the size and shape of your heart and how well your heart's chambers and valves are working. This procedure takes approximately one hour. There are no restrictions for this procedure. You may get an IV, if needed, to receive an ultrasound enhancing agent through to better visualize your heart.    Follow-Up: At [facility], you and your health needs are our priority.  As part of our continuing mission to provide you with exceptional heart care, we have created designated Provider Care Teams.  These Care Teams include your primary Cardiologist (physician) and Advanced Practice Providers (APPs -  Physician Assistants and Nurse Practitioners) who all work together to  provide you with the care you need, when you need it. You will need a follow up appointment in 4-6 weeks.  You may see Debbe Odea, MD or one of the following Advanced Practice Providers on your designated Care Team:   Nicolasa Ducking, NP Eula Listen, PA-C . Marisue Ivan, PA-C   Echocardiogram An echocardiogram is a procedure that uses painless sound waves (ultrasound) to produce an image of the heart. Images from an echocardiogram can provide important information about:  Signs of coronary artery disease (CAD).  Aneurysm detection. An aneurysm is a weak or damaged part of an artery wall that bulges out from the normal force of blood pumping through the body.  Heart size and shape. Changes in the size or shape of the heart can be associated with certain conditions, including heart failure, aneurysm, and CAD.  Heart muscle function.  Heart valve function.  Signs of a past heart attack.  Fluid buildup around the heart.  Thickening of the heart muscle.  A tumor or infectious growth around the heart valves. Tell a health care provider about:  Any allergies you have.  All medicines you are taking, including vitamins, herbs, eye drops, creams, and over-the-counter medicines.  Any blood disorders you have.  Any surgeries you have had.  Any medical conditions you have.  Whether you are pregnant or may be pregnant. What are the risks? Generally, this is a safe procedure. However, problems may occur, including:  Allergic reaction to dye (contrast) that may be used during the procedure. What happens before the procedure? No specific preparation is needed. You may eat and drink normally. What happens during the procedure?   An IV tube may be inserted into one of your veins.  You may receive contrast through this tube. A contrast is an injection that improves the quality of the pictures from your heart.  A gel will be applied to your chest.  A wand-like tool  (transducer) will be moved over your chest. The gel will help to transmit the sound waves from the transducer.  The sound waves will harmlessly bounce off of your heart to allow  the heart images to be captured in real-time motion. The images will be recorded on a computer. The procedure may vary among health care providers and hospitals. What happens after the procedure?  You may return to your normal, everyday life, including diet, activities, and medicines, unless your health care provider tells you not to do that. Summary  An echocardiogram is a procedure that uses painless sound waves (ultrasound) to produce an image of the heart.  Images from an echocardiogram can provide important information about the size and shape of your heart, heart muscle function, heart valve function, and fluid buildup around your heart.  You do not need to do anything to prepare before this procedure. You may eat and drink normally.  After the echocardiogram is completed, you may return to your normal, everyday life, unless your health care provider tells you not to do that. This information is not intended to replace advice given to you by your health care provider. Make sure you discuss any questions you have with your health care provider. Document Released: 08/03/2000 Document Revised: 11/27/2018 Document Reviewed: 09/08/2016 Elsevier Patient Education  2020 [employer].      Signed, Debbe Odea, MD  06/02/2019 2:25 PM    [facility]

## 2019-06-03 DIAGNOSIS — R0600 Dyspnea, unspecified: Secondary | ICD-10-CM | POA: Diagnosis not present

## 2019-06-18 ENCOUNTER — Ambulatory Visit (INDEPENDENT_AMBULATORY_CARE_PROVIDER_SITE_OTHER): Payer: Medicare Other

## 2019-06-18 ENCOUNTER — Other Ambulatory Visit: Payer: Self-pay

## 2019-06-18 DIAGNOSIS — R600 Localized edema: Secondary | ICD-10-CM

## 2019-06-22 ENCOUNTER — Telehealth: Payer: Self-pay | Admitting: Cardiology

## 2019-06-22 NOTE — Telephone Encounter (Signed)
Patient calling to discuss recent testing results  ° °Please call  ° °

## 2019-07-10 ENCOUNTER — Ambulatory Visit: Payer: Medicare Other | Admitting: Cardiology

## 2019-09-23 DIAGNOSIS — M170 Bilateral primary osteoarthritis of knee: Secondary | ICD-10-CM | POA: Diagnosis not present

## 2019-09-23 DIAGNOSIS — S53402A Unspecified sprain of left elbow, initial encounter: Secondary | ICD-10-CM | POA: Diagnosis not present

## 2019-10-15 DIAGNOSIS — M7712 Lateral epicondylitis, left elbow: Secondary | ICD-10-CM | POA: Diagnosis not present

## 2019-12-02 DIAGNOSIS — H90A21 Sensorineural hearing loss, unilateral, right ear, with restricted hearing on the contralateral side: Secondary | ICD-10-CM | POA: Diagnosis not present

## 2019-12-02 DIAGNOSIS — H9121 Sudden idiopathic hearing loss, right ear: Secondary | ICD-10-CM | POA: Diagnosis not present

## 2019-12-02 DIAGNOSIS — H6981 Other specified disorders of Eustachian tube, right ear: Secondary | ICD-10-CM | POA: Diagnosis not present

## 2019-12-28 ENCOUNTER — Encounter: Payer: Self-pay | Admitting: Internal Medicine

## 2019-12-28 ENCOUNTER — Other Ambulatory Visit: Payer: Self-pay

## 2019-12-28 ENCOUNTER — Ambulatory Visit (INDEPENDENT_AMBULATORY_CARE_PROVIDER_SITE_OTHER): Payer: Medicare Other | Admitting: Internal Medicine

## 2019-12-28 VITALS — BP 132/74 | HR 90 | Wt 151.9 lb

## 2019-12-28 DIAGNOSIS — I10 Essential (primary) hypertension: Secondary | ICD-10-CM

## 2019-12-28 DIAGNOSIS — K219 Gastro-esophageal reflux disease without esophagitis: Secondary | ICD-10-CM

## 2019-12-28 DIAGNOSIS — J441 Chronic obstructive pulmonary disease with (acute) exacerbation: Secondary | ICD-10-CM | POA: Diagnosis not present

## 2019-12-28 DIAGNOSIS — I872 Venous insufficiency (chronic) (peripheral): Secondary | ICD-10-CM | POA: Insufficient documentation

## 2019-12-28 DIAGNOSIS — R600 Localized edema: Secondary | ICD-10-CM

## 2019-12-28 DIAGNOSIS — M170 Bilateral primary osteoarthritis of knee: Secondary | ICD-10-CM | POA: Diagnosis not present

## 2019-12-28 DIAGNOSIS — E039 Hypothyroidism, unspecified: Secondary | ICD-10-CM

## 2019-12-28 DIAGNOSIS — N644 Mastodynia: Secondary | ICD-10-CM

## 2019-12-28 DIAGNOSIS — R0789 Other chest pain: Secondary | ICD-10-CM

## 2019-12-28 DIAGNOSIS — Z1231 Encounter for screening mammogram for malignant neoplasm of breast: Secondary | ICD-10-CM

## 2019-12-28 NOTE — Addendum Note (Signed)
Addended by: Alois Cliche on: 12/28/2019 04:10 PM   Modules accepted: Orders

## 2019-12-28 NOTE — Progress Notes (Addendum)
Established Patient Office Visit  Subjective:  Patient ID: Abigail Strong, female    DOB: 21-Oct-1929  Age: 86 y.o. MRN: [id number]  CC:  Chief Complaint  Patient presents with  . Edema    Both Legs    HPI  Abigail Strong presents for complaint of swelling of the legs left more than the right. she has seen orthopedic surgeon and also has been seen in the emergency room ,.now wants to see a vein doctor to evaluate the legs  Past Medical History:  Diagnosis Date  . Depression   . GERD (gastroesophageal reflux disease)   . Hyperlipidemia   . Hypothyroidism   . Insomnia     Past Surgical History:  Procedure Laterality Date  . ABDOMINAL HYSTERECTOMY    . BREAST BIOPSY Bilateral 90's   benign  . cataracts    . COLON SURGERY     partial colectomy  . intestinal blockage    . NISSEN FUNDOPLICATION      Family History  Problem Relation Age of Onset  . Cervical cancer Mother   . Breast cancer Maternal Aunt     Social History   Socioeconomic History  . Marital status: Married    Spouse name: Not on file  . Number of children: Not on file  . Years of education: Not on file  . Highest education level: Not on file  Occupational History  . Not on file  Tobacco Use  . Smoking status: Never Smoker  . Smokeless tobacco: Never Used  Substance and Sexual Activity  . Alcohol use: No  . Drug use: No  . Sexual activity: Never  Other Topics Concern  . Not on file  Social History Narrative  . Not on file   Social Determinants of Health   Financial Resource Strain:   . Difficulty of Paying Living Expenses:   Food Insecurity:   . Worried About Programme researcher, broadcasting/film/video in the Last Year:   . Barista in the Last Year:   Transportation Needs:   . Freight forwarder (Medical):   [name] Kitchen Lack of Transportation (Non-Medical):   Physical Activity:   . Days of Exercise per Week:   . Minutes of Exercise per Session:   Stress:   . Feeling of Stress :   Social  Connections:   . Frequency of Communication with Friends and Family:   . Frequency of Social Gatherings with Friends and Family:   . Attends Religious Services:   . Active Member of Clubs or Organizations:   . Attends Banker Meetings:   [name] Kitchen Marital Status:   Intimate Partner Violence:   . Fear of Current or Ex-Partner:   . Emotionally Abused:   [name] Kitchen Physically Abused:   . Sexually Abused:      Current Outpatient Medications:  .  acetaminophen (TYLENOL) 500 MG tablet, Take 1,000 mg by mouth every 6 (six) hours as needed., Disp: , Rfl:  .  aspirin 81 MG chewable tablet, Chew 81 mg by mouth daily., Disp: , Rfl:  .  clopidogrel (PLAVIX) 75 MG tablet, Take 75 mg by mouth daily. , Disp: , Rfl:  .  gabapentin (NEURONTIN) 100 MG capsule, Take 100 mg by mouth 3 (three) times daily., Disp: , Rfl:  .  levothyroxine (SYNTHROID, LEVOTHROID) 125 MCG tablet, Take 125 mcg by mouth daily before breakfast., Disp: , Rfl:  .  omeprazole (PRILOSEC) 40 MG capsule, Take 40 mg by mouth 2 (two) times daily.,  Disp: , Rfl:  .  pravastatin (PRAVACHOL) 40 MG tablet, Take 40 mg by mouth at bedtime., Disp: , Rfl:    Allergies  Allergen Reactions  . Sulfa Antibiotics Other (See Comments)  . Sulfonamide Derivatives     ROS Review of Systems  HENT: Negative for postnasal drip.   Eyes: Negative for pain.  Respiratory: Positive for shortness of breath. Negative for chest tightness.   Gastrointestinal: Negative for abdominal pain.  Endocrine: Negative for polydipsia.  Genitourinary: Positive for frequency and urgency.  Musculoskeletal: Positive for back pain (back pain intermittenly).  Neurological: Negative for headaches.  Psychiatric/Behavioral: Negative for agitation, behavioral problems and decreased concentration.      Objective:    Physical Exam  Constitutional: She appears well-developed and well-nourished.  HENT:  Head: Normocephalic and atraumatic.  Eyes: Pupils are equal, round, and  reactive to light.  Neck: No JVD present. No tracheal deviation present. No thyromegaly present.  Cardiovascular: Normal rate.  Pulmonary/Chest: Stridor present. No respiratory distress. She has wheezes (nonsmoker).  Abdominal: There is no abdominal tenderness.  Musculoskeletal:        General: Edema (both legs 2+) present.     Cervical back: Normal range of motion and neck supple.    BP 132/74 (BP Location: Left Arm, Patient Position: Sitting, Cuff Size: Normal)   Pulse 90   Wt 151 lb 14.4 oz (68.9 kg)   BMI 25.67 kg/m  Wt Readings from Last 3 Encounters:  12/28/19 151 lb 14.4 oz (68.9 kg)  06/02/19 149 lb 8 oz (67.8 kg)  04/25/19 150 lb (68 kg)     Health Maintenance Due  Topic Date Due  . COVID-19 Vaccine (1) Never done  . TETANUS/TDAP  Never done  . DEXA SCAN  Never done  . PNA vac Low Risk Adult (1 of 2 - PCV13) Never done    There are no preventive care reminders to display for this patient.  Lab Results  Component Value Date   TSH 0.143 (L) 07/18/2016   Lab Results  Component Value Date   WBC 11.1 (H) 04/24/2019   HGB 12.8 04/24/2019   HCT 39.4 04/24/2019   MCV 90.6 04/24/2019   PLT 241 04/24/2019   Lab Results  Component Value Date   NA 141 04/24/2019   K 3.7 04/24/2019   CO2 26 04/24/2019   GLUCOSE 95 04/24/2019   BUN 25 (H) 04/24/2019   CREATININE 0.62 04/24/2019   BILITOT 0.7 04/24/2019   ALKPHOS 52 04/24/2019   AST 19 04/24/2019   ALT 12 04/24/2019   PROT 6.8 04/24/2019   ALBUMIN 4.1 04/24/2019   CALCIUM 9.8 04/24/2019   ANIONGAP 11 04/24/2019   No results found for: CHOL No results found for: HDL No results found for: LDLCALC No results found for: TRIG No results found for: CHOLHDL Lab Results  Component Value Date   HGBA1C 6.3 (H) 09/24/2016      Assessment & Plan:   Problem List Items Addressed This Visit    None    Patient came with a swelling of the both legs left more than the right.  Patient was checked for deep venous  thrombosis but it is negative.  She wants to go to see vein specialist to help her out an appointment will be made for her to have to have it seen by vascular specialist.  She also wants a mammogram so we will schedule that.  I will follow her up in about 6 weeks thank you  No orders of the defined types were placed in this encounter.  1. COPD exacerbation (HCC) Chronic problem.  2. Gastroesophageal reflux disease without esophagitis Chronic problem.  3. Hypothyroidism, unspecified type Stable.  4. Osteoarthritis of both knees, unspecified osteoarthritis type Referred to orthopedic surgeon.  5. Edema of lower leg due to peripheral venous insufficiency Low-salt diet.  Patient does not have any evidence of venous thrombosis.  She is already seeing the vascular doctor. - Ambulatory referral to Vascular Surgery  6. Musculoskeletal chest pain Secondary to obesity.    7. Encounter for screening mammogram for malignant neoplasm of breast Mammogram ordered. - MM 3D SCREEN BREAST BILATERAL; Future  8 Essential hypertension Blood pressure labile at the present time. Follow-up: No follow-ups on file.    Corky Downs, MD

## 2019-12-28 NOTE — Addendum Note (Signed)
Addended by: Alois Cliche on: 12/28/2019 05:42 PM   Modules accepted: Orders

## 2020-01-07 ENCOUNTER — Other Ambulatory Visit: Payer: Self-pay | Admitting: Internal Medicine

## 2020-01-12 ENCOUNTER — Encounter (INDEPENDENT_AMBULATORY_CARE_PROVIDER_SITE_OTHER): Payer: Self-pay | Admitting: Vascular Surgery

## 2020-01-12 ENCOUNTER — Ambulatory Visit (INDEPENDENT_AMBULATORY_CARE_PROVIDER_SITE_OTHER): Payer: Medicare Other | Admitting: Vascular Surgery

## 2020-01-12 ENCOUNTER — Other Ambulatory Visit: Payer: Self-pay

## 2020-01-12 VITALS — BP 132/76 | HR 81 | Resp 16 | Ht 65.5 in | Wt 149.8 lb

## 2020-01-12 DIAGNOSIS — M7989 Other specified soft tissue disorders: Secondary | ICD-10-CM | POA: Diagnosis not present

## 2020-01-12 DIAGNOSIS — M170 Bilateral primary osteoarthritis of knee: Secondary | ICD-10-CM

## 2020-01-12 DIAGNOSIS — I10 Essential (primary) hypertension: Secondary | ICD-10-CM | POA: Diagnosis not present

## 2020-01-12 DIAGNOSIS — R0989 Other specified symptoms and signs involving the circulatory and respiratory systems: Secondary | ICD-10-CM | POA: Diagnosis not present

## 2020-01-12 NOTE — Patient Instructions (Signed)

## 2020-01-12 NOTE — Assessment & Plan Note (Signed)
blood pressure control important in reducing the progression of atherosclerotic disease. On appropriate oral medications.  

## 2020-01-12 NOTE — Assessment & Plan Note (Signed)
Has significant arthritis and a sizable Baker's cyst was seen on her ultrasound last fall.  I suspect this is playing a contributing role in her lower extremity swelling.

## 2020-01-12 NOTE — Assessment & Plan Note (Signed)
We will check a carotid duplex.  Patient reports previous only being told she had problems with her neck arteries.

## 2020-01-12 NOTE — Assessment & Plan Note (Signed)
I have had a long discussion with the patient regarding swelling and why it  causes symptoms.  Patient will continue wearing graduated compression stockings class 1 (20-30 mmHg). The patient will  beginning wearing the stockings first thing in the morning and removing them in the evening. The patient is instructed specifically not to sleep in the stockings.   In addition, behavioral modification will be initiated.  This will include frequent elevation, use of over the counter pain medications and exercise such as walking.  I have reviewed systemic causes for chronic edema such as liver, kidney and cardiac etiologies.  The patient denies problems with these organ systems.    Consideration for a lymph pump will also be made based upon the effectiveness of conservative therapy.  This would help to improve the edema control and prevent sequela such as ulcers and infections   Patient should undergo duplex ultrasound of the venous system to ensure that DVT or reflux is not present.  The patient will follow-up with me after the ultrasound.

## 2020-01-12 NOTE — Progress Notes (Signed)
Patient ID: Abigail Strong, female   DOB: 14-Jan-1930, 86 y.o.   MRN: [id number]  Chief Complaint  Patient presents with  . New Patient (Initial Visit)    ref Masoud le edema    HPI Abigail Strong is a 86 y.o. female.  I am asked to see the patient by Dr. Juel Burrow for evaluation of leg swelling.  About 6 to 8 months ago, she had a severe episode of leg swelling which prompted a hospital visit.  At that time she had an ultrasound and a CT venogram which I have reviewed.  The ultrasound showed no evidence of DVT and the venogram showed patent iliac veins and IVC without a significant iliac vein stenosis or obstruction of any kind.  She has been wearing compression stockings which controls her swelling somewhat, but she continues to have persistent swelling.  The left leg is more severely affected than the right leg.  No ulceration or infection.  The legs are heavy and tired and they do ache quite a bit.  No ulceration or infection.  She has been wearing compression stockings since that time and these have done a reasonable job of controlling her swelling. The patient also reports being told she had problem with her neck arteries.  These have not been checked in some time.  She does not have any focal neurologic symptoms worrisome for cerebrovascular ischemia.  Past Medical History:  Diagnosis Date  . Depression   . GERD (gastroesophageal reflux disease)   . Hyperlipidemia   . Hyperlipidemia, unspecified 01/01/2014  . Hypothyroidism   . Insomnia   . Musculoskeletal chest pain 09/27/2016    Past Surgical History:  Procedure Laterality Date  . ABDOMINAL HYSTERECTOMY    . BREAST BIOPSY Bilateral 90's   benign  . cataracts    . COLON SURGERY     partial colectomy  . intestinal blockage    . NISSEN FUNDOPLICATION       Family History  Problem Relation Age of Onset  . Cervical cancer Mother   . Breast cancer Maternal Aunt   no bleeding or clotting disorders   Social History    Tobacco Use  . Smoking status: Never Smoker  . Smokeless tobacco: Never Used  Substance Use Topics  . Alcohol use: No  . Drug use: No     Allergies  Allergen Reactions  . Sulfa Antibiotics Other (See Comments)  . Sulfonamide Derivatives     Current Outpatient Medications  Medication Sig Dispense Refill  . acetaminophen (TYLENOL) 500 MG tablet Take 1,000 mg by mouth every 6 (six) hours as needed.    [name] Kitchen aspirin 81 MG chewable tablet Chew 81 mg by mouth daily.    . clopidogrel (PLAVIX) 75 MG tablet Take 1 tablet by mouth once daily 90 tablet 0  . gabapentin (NEURONTIN) 100 MG capsule Take 100 mg by mouth 3 (three) times daily.    [name] Kitchen levothyroxine (SYNTHROID, LEVOTHROID) 125 MCG tablet Take 125 mcg by mouth daily before breakfast.    . omeprazole (PRILOSEC) 40 MG capsule Take 40 mg by mouth 2 (two) times daily.    . pravastatin (PRAVACHOL) 40 MG tablet TAKE 1 TABLET BY MOUTH AT BEDTIME 90 tablet 0   No current facility-administered medications for this visit.      REVIEW OF SYSTEMS (Negative unless checked)  Constitutional: [] Weight loss  [] Fever  [] Chills Cardiac: [] Chest pain   [] Chest pressure   [] Palpitations   [] Shortness of breath when laying flat   []   Shortness of breath at rest   [] Shortness of breath with exertion. Vascular:  [] Pain in legs with walking   [] Pain in legs at rest   [] Pain in legs when laying flat   [] Claudication   [] Pain in feet when walking  [] Pain in feet at rest  [] Pain in feet when laying flat   [] History of DVT   [] Phlebitis   [x] Swelling in legs   [] Varicose veins   [] Non-healing ulcers Pulmonary:   [] Uses home oxygen   [] Productive cough   [] Hemoptysis   [] Wheeze  [] COPD   [] Asthma Neurologic:  [] Dizziness  [] Blackouts   [] Seizures   [] History of stroke   [] History of TIA  [] Aphasia   [] Temporary blindness   [] Dysphagia   [] Weakness or numbness in arms   [] Weakness or numbness in legs Musculoskeletal:  [x] Arthritis   [] Joint swelling   [x] Joint pain    [] Low back pain Hematologic:  [] Easy bruising  [] Easy bleeding   [] Hypercoagulable state   [] Anemic  [] Hepatitis Gastrointestinal:  [] Blood in stool   [] Vomiting blood  [] Gastroesophageal reflux/heartburn   [] Abdominal pain Genitourinary:  [] Chronic kidney disease   [] Difficult urination  [] Frequent urination  [] Burning with urination   [] Hematuria Skin:  [] Rashes   [] Ulcers   [] Wounds Psychological:  [] History of anxiety   []  History of major depression.    Physical Exam BP 132/76 (BP Location: Right Arm)   Pulse 81   Resp 16   Ht 5' 5.5" (1.664 m)   Wt 149 lb 12.8 oz (67.9 kg)   BMI 24.55 kg/m  Gen:  WD/WN, NAD. Appears younger than stated age. Head: [place]/AT, No temporalis wasting.  Ear/Nose/Throat: Hearing grossly intact, nares w/o erythema or drainage, oropharynx w/o Erythema/Exudate Eyes: Conjunctiva clear, sclera non-icteric  Neck: trachea midline.  No JVD.  Soft right carotid bruit Pulmonary:  Good air movement, respirations not labored, no use of accessory muscles  Cardiac: RRR, no JVD Vascular:  Vessel Right Left  Radial Palpable Palpable                          DP  palpable  palpable  PT  palpable  palpable   Gastrointestinal:. No masses, surgical incisions, or scars. Musculoskeletal: M/S 5/5 throughout.  Extremities without ischemic changes.  No deformity or atrophy.  1-2+ right lower extremity edema, 2+ left lower extremity edema. Neurologic: Sensation grossly intact in extremities.  Symmetrical.  Speech is fluent. Motor exam as listed above. Psychiatric: Judgment intact, Mood & affect appropriate for pt's clinical situation. Dermatologic: No rashes or ulcers noted.  No cellulitis or open wounds.    Radiology No results found.  Labs No results found for this or any previous visit (from the past 2160 hour(s)).  Assessment/Plan:  Essential hypertension blood pressure control important in reducing the progression of atherosclerotic disease. On appropriate  oral medications.   OA (osteoarthritis) Has significant arthritis and a sizable Baker's cyst was seen on her ultrasound last fall.  I suspect this is playing a contributing role in her lower extremity swelling.  Swelling of limb I have had a long discussion with the patient regarding swelling and why it  causes symptoms.  Patient will continue wearing graduated compression stockings class 1 (20-30 mmHg). The patient will  beginning wearing the stockings first thing in the morning and removing them in the evening. The patient is instructed specifically not to sleep in the stockings.   In addition, behavioral modification will be initiated.  This will include frequent elevation, use of over the counter pain medications and exercise such as walking.  I have reviewed systemic causes for chronic edema such as liver, kidney and cardiac etiologies.  The patient denies problems with these organ systems.    Consideration for a lymph pump will also be made based upon the effectiveness of conservative therapy.  This would help to improve the edema control and prevent sequela such as ulcers and infections   Patient should undergo duplex ultrasound of the venous system to ensure that DVT or reflux is not present.  The patient will follow-up with me after the ultrasound.    Bruit of right carotid artery We will check a carotid duplex.  Patient reports previous only being told she had problems with her neck arteries.      Festus Barren 01/12/2020, 2:26 PM   This note was created with Dragon medical transcription system.  Any errors from dictation are unintentional.

## 2020-01-14 ENCOUNTER — Telehealth: Payer: Self-pay | Admitting: Internal Medicine

## 2020-01-14 NOTE — Addendum Note (Signed)
Addended by: Alois Cliche on: 01/14/2020 04:23 PM   Modules accepted: Orders

## 2020-01-14 NOTE — Telephone Encounter (Signed)
Abigail Strong wants to know if we got her a mammogram scheduled yet.

## 2020-01-15 NOTE — Assessment & Plan Note (Signed)
Avoid eating at night before sleeping.

## 2020-01-15 NOTE — Assessment & Plan Note (Signed)
No change in the swelling.

## 2020-01-15 NOTE — Addendum Note (Signed)
Addended by: Alois Cliche on: 01/15/2020 10:03 AM   Modules accepted: Orders

## 2020-01-15 NOTE — Telephone Encounter (Signed)
Patient is scheduled for mammogram  01/21/20 at 11:20

## 2020-01-15 NOTE — Assessment & Plan Note (Signed)
Referred to orthopedic surgeon. 

## 2020-01-15 NOTE — Assessment & Plan Note (Signed)
Blood pressure stable ? ?

## 2020-01-15 NOTE — Assessment & Plan Note (Signed)
Chronic problem. 

## 2020-01-19 ENCOUNTER — Telehealth: Payer: Self-pay | Admitting: Internal Medicine

## 2020-01-19 NOTE — Telephone Encounter (Signed)
Spoke with patient, she has concerns of low blood pressure. This morning patient's blood pressure was 114/58. She was informed to stay hydrated and to let us know if her blood pressure drops below 90/50. Patient has an appt scheduled to speak with Dr. Lavera Guise on Thursday at 3:00 pm to discuss her concern.

## 2020-01-19 NOTE — Telephone Encounter (Signed)
Patient was wanting to come in today for a visit no space can you call and see what is wrong.

## 2020-01-21 ENCOUNTER — Ambulatory Visit: Payer: Medicare Other | Admitting: Internal Medicine

## 2020-01-21 ENCOUNTER — Ambulatory Visit (INDEPENDENT_AMBULATORY_CARE_PROVIDER_SITE_OTHER): Payer: Medicare Other | Admitting: Internal Medicine

## 2020-01-21 ENCOUNTER — Ambulatory Visit
Admission: RE | Admit: 2020-01-21 | Discharge: 2020-01-21 | Disposition: A | Discharge status: Home / Self Care | Payer: Medicare Other | Source: Ambulatory Visit | Attending: Internal Medicine | Admitting: Internal Medicine

## 2020-01-21 ENCOUNTER — Other Ambulatory Visit: Payer: Self-pay | Admitting: Internal Medicine

## 2020-01-21 ENCOUNTER — Encounter: Payer: Self-pay | Admitting: Internal Medicine

## 2020-01-21 ENCOUNTER — Other Ambulatory Visit: Payer: Self-pay

## 2020-01-21 VITALS — BP 119/62 | HR 50 | Wt 152.5 lb

## 2020-01-21 DIAGNOSIS — K219 Gastro-esophageal reflux disease without esophagitis: Secondary | ICD-10-CM | POA: Diagnosis not present

## 2020-01-21 DIAGNOSIS — R42 Dizziness and giddiness: Secondary | ICD-10-CM | POA: Diagnosis not present

## 2020-01-21 DIAGNOSIS — R600 Localized edema: Secondary | ICD-10-CM

## 2020-01-21 DIAGNOSIS — I872 Venous insufficiency (chronic) (peripheral): Secondary | ICD-10-CM

## 2020-01-21 DIAGNOSIS — Z1231 Encounter for screening mammogram for malignant neoplasm of breast: Secondary | ICD-10-CM | POA: Diagnosis not present

## 2020-01-21 DIAGNOSIS — I10 Essential (primary) hypertension: Secondary | ICD-10-CM | POA: Diagnosis not present

## 2020-01-21 NOTE — Assessment & Plan Note (Signed)
Blood pressure at the lower end right now.

## 2020-01-21 NOTE — Assessment & Plan Note (Signed)
Due to chronic lymphedema.  Continue pressure stocking

## 2020-01-21 NOTE — Assessment & Plan Note (Signed)
Heart rate is 50. systolic blood pressure is 119.  I will get an EKG done.

## 2020-01-21 NOTE — Progress Notes (Signed)
Patient ID: Abigail Strong, female   DOB: Jun 18, 1930, 86 y.o.   MRN: [id number]    Established Patient Office Visit  Subjective:  Patient ID: Abigail Strong, female    DOB: 1930/05/23  Age: 86 y.o. MRN: [id number]  CC:  Chief Complaint  Patient presents with  . low blood pressure    patient complains of having lower blood pressure in the morning times   . Dizziness    patient states that the Gabapentin was making her feel dizzy so she has stopped taking it and symptoms have resolved     HPI  Abigail Strong presents for low blood pressure in the mornings. Her blood pressure this morning was 107/50. She is feeling tired, short of breath, and fatigued. She uses a CPAP at night and feels very short of breath when she wakes up in the morning. She also reports leg swelling, occasional reflux, and hearing loss in her right ear. Additionally, she has trouble falling and staying sleeping at night. She takes two melatonin pills each night but they do not help. The patient denies chest pain and any other symptoms or complaints at this time.   The patient is ambulating with a cane. The patient does not smoke, drink alcohol, or chew tobacco. She had an injection in her knee recently for her arthritis. She had a mammogram completed this morning.   Past Medical History:  Diagnosis Date  . Depression   . GERD (gastroesophageal reflux disease)   . Hyperlipidemia   . Hyperlipidemia, unspecified 01/01/2014  . Hypothyroidism   . Insomnia   . Musculoskeletal chest pain 09/27/2016    Past Surgical History:  Procedure Laterality Date  . ABDOMINAL HYSTERECTOMY    . BREAST BIOPSY Bilateral 90's   benign  . cataracts    . COLON SURGERY     partial colectomy  . intestinal blockage    . NISSEN FUNDOPLICATION      Family History  Problem Relation Age of Onset  . Cervical cancer Mother   . Breast cancer Maternal Aunt     Social History   Socioeconomic History  . Marital status: Married      Spouse name: Not on file  . Number of children: Not on file  . Years of education: Not on file  . Highest education level: Not on file  Occupational History  . Not on file  Tobacco Use  . Smoking status: Never Smoker  . Smokeless tobacco: Never Used  Substance and Sexual Activity  . Alcohol use: No  . Drug use: No  . Sexual activity: Never  Other Topics Concern  . Not on file  Social History Narrative  . Not on file   Social Determinants of Health   Financial Resource Strain:   . Difficulty of Paying Living Expenses:   Food Insecurity:   . Worried About Programme researcher, broadcasting/film/video in the Last Year:   . Barista in the Last Year:   Transportation Needs:   . Freight forwarder (Medical):   [name] Kitchen Lack of Transportation (Non-Medical):   Physical Activity:   . Days of Exercise per Week:   . Minutes of Exercise per Session:   Stress:   . Feeling of Stress :   Social Connections:   . Frequency of Communication with Friends and Family:   . Frequency of Social Gatherings with Friends and Family:   . Attends Religious Services:   . Active Member of Clubs or Organizations:   .  Attends Banker Meetings:   [name] Kitchen Marital Status:   Intimate Partner Violence:   . Fear of Current or Ex-Partner:   . Emotionally Abused:   [name] Kitchen Physically Abused:   . Sexually Abused:      Current Outpatient Medications:  .  acetaminophen (TYLENOL) 500 MG tablet, Take 1,000 mg by mouth every 6 (six) hours as needed., Disp: , Rfl:  .  aspirin 81 MG chewable tablet, Chew 81 mg by mouth daily., Disp: , Rfl:  .  clopidogrel (PLAVIX) 75 MG tablet, Take 1 tablet by mouth once daily, Disp: 90 tablet, Rfl: 0 .  levothyroxine (SYNTHROID, LEVOTHROID) 125 MCG tablet, Take 125 mcg by mouth daily before breakfast., Disp: , Rfl:  .  omeprazole (PRILOSEC) 40 MG capsule, Take 40 mg by mouth 2 (two) times daily., Disp: , Rfl:  .  pravastatin (PRAVACHOL) 40 MG tablet, TAKE 1 TABLET BY MOUTH AT BEDTIME, Disp:  90 tablet, Rfl: 0   Allergies  Allergen Reactions  . Gabapentin Other (See Comments)    Dizziness   . Sulfa Antibiotics Other (See Comments)  . Sulfonamide Derivatives     ROS Review of Systems  Constitutional: Positive for fatigue.  HENT: Positive for hearing loss.   Eyes: Negative.   Respiratory: Positive for shortness of breath.   Cardiovascular: Positive for leg swelling. Negative for chest pain.  Gastrointestinal: Negative.   Endocrine: Negative.   Genitourinary: Negative.   Musculoskeletal: Negative.   Skin: Negative.   Allergic/Immunologic: Negative.   Neurological: Negative.   Hematological: Negative.   Psychiatric/Behavioral: Positive for sleep disturbance.      Objective:    Physical Exam  Constitutional: The patient is oriented to person, place, and time. Pt appears well-developed and well-nourished. She ambulates with a cane. Head: Normocephalic and atraumatic.  Eyes: Pupils are equal, round, and reactive to light.  Neck: No JVD present. No tracheal deviation present. No thyromegaly present.  Cardiovascular: Regular rate and rhythm. No gallop. Mild swelling in legs bilaterally. Pulmonary/Chest: Normal breath sounds. Lungs clear to auscultation. Abdominal: No abdominal tenderness. No guarding or rebound tenderness.. Musculoskeletal: Normal range of motion.  Lymphatic: No cervical adenopathy.  Neurological: No cranial nerve deficit.  Skin: Skin is warm and hydrated. Psychiatric: The patient has a normal mood and affect.  BP 119/62   Pulse (!) 50   Wt 152 lb 8 oz (69.2 kg)   BMI 24.99 kg/m  Wt Readings from Last 3 Encounters:  01/21/20 152 lb 8 oz (69.2 kg)  01/12/20 149 lb 12.8 oz (67.9 kg)  12/28/19 151 lb 14.4 oz (68.9 kg)     Health Maintenance Due  Topic Date Due  . COVID-19 Vaccine (1) Never done  . TETANUS/TDAP  Never done  . DEXA SCAN  Never done  . PNA vac Low Risk Adult (1 of 2 - PCV13) Never done    There are no preventive care  reminders to display for this patient.  Lab Results  Component Value Date   TSH 0.143 (L) 07/18/2016   Lab Results  Component Value Date   WBC 11.1 (H) 04/24/2019   HGB 12.8 04/24/2019   HCT 39.4 04/24/2019   MCV 90.6 04/24/2019   PLT 241 04/24/2019   Lab Results  Component Value Date   NA 141 04/24/2019   K 3.7 04/24/2019   CO2 26 04/24/2019   GLUCOSE 95 04/24/2019   BUN 25 (H) 04/24/2019   CREATININE 0.62 04/24/2019   BILITOT 0.7 04/24/2019   ALKPHOS  52 04/24/2019   AST 19 04/24/2019   ALT 12 04/24/2019   PROT 6.8 04/24/2019   ALBUMIN 4.1 04/24/2019   CALCIUM 9.8 04/24/2019   ANIONGAP 11 04/24/2019   No results found for: CHOL No results found for: HDL No results found for: LDLCALC No results found for: TRIG No results found for: CHOLHDL Lab Results  Component Value Date   HGBA1C 6.3 (H) 09/24/2016      Assessment & Plan:      Electrocardiogram interpretation.  EKG shows sinus rhythm with second-degree AV block Mobitz 2.  With left axis deviation.  Nonspecific intraventricular conduction delay is noted.    Problem List Items Addressed This Visit      Cardiovascular and Mediastinum   Essential hypertension - Primary    Blood pressure at the lower end right now.      Edema of lower leg due to peripheral venous insufficiency    Due to chronic lymphedema.  Continue pressure stocking         Digestive   GERD (gastroesophageal reflux disease)    Reflux is stable on medications.        Other   Dizzinesses    Heart rate is 50. systolic blood pressure is 119.  I will get an EKG done.      Relevant Orders   EKG 12-Lead      No orders of the defined types were placed in this encounter.   Follow-up: Return in about 1 week (around 01/28/2020).   By signing my name below, I, Pietro Cassis, attest that this documentation has been prepared under the direction and in the presence of Corky Downs, MD. Electronically Signed: Pietro Cassis, Medical  Scribe. 01/21/20. 4:34 PM.  I personally performed the services described in this documentation, which was SCRIBED in my presence. The recorded information has been reviewed and considered accurate. It has been edited as necessary during review. Corky Downs, MD

## 2020-01-21 NOTE — Assessment & Plan Note (Signed)
Reflux is stable on medications.

## 2020-01-27 ENCOUNTER — Other Ambulatory Visit: Payer: Self-pay

## 2020-01-28 ENCOUNTER — Other Ambulatory Visit: Payer: Self-pay | Admitting: *Deleted

## 2020-01-28 MED ORDER — FUROSEMIDE 40 MG PO TABS: 40.0000 mg | ORAL_TABLET | Freq: Every day | ORAL | 6 refills | Status: DC

## 2020-02-01 ENCOUNTER — Other Ambulatory Visit (INDEPENDENT_AMBULATORY_CARE_PROVIDER_SITE_OTHER): Payer: Medicare Other

## 2020-02-01 ENCOUNTER — Other Ambulatory Visit: Payer: Self-pay | Admitting: Internal Medicine

## 2020-02-01 ENCOUNTER — Other Ambulatory Visit: Payer: Self-pay

## 2020-02-01 VITALS — BP 115/72 | HR 85 | Ht 65.0 in | Wt 152.9 lb

## 2020-02-01 DIAGNOSIS — R079 Chest pain, unspecified: Secondary | ICD-10-CM

## 2020-02-01 DIAGNOSIS — E039 Hypothyroidism, unspecified: Secondary | ICD-10-CM

## 2020-02-01 DIAGNOSIS — R0989 Other specified symptoms and signs involving the circulatory and respiratory systems: Secondary | ICD-10-CM | POA: Diagnosis not present

## 2020-02-01 DIAGNOSIS — R42 Dizziness and giddiness: Secondary | ICD-10-CM | POA: Diagnosis not present

## 2020-02-01 NOTE — Progress Notes (Unsigned)
Established Patient Office Visit  Subjective:  Patient ID: Abigail Strong, female    DOB: 27-Mar-1930  Age: 86 y.o. MRN: [id number]  CC:  Chief Complaint  Patient presents with  . Holter    patient here today for holter results     HPI  Abigail Strong presents to review the Holter monitor report from Dr.  Past Medical History:  Diagnosis Date  . Depression   . GERD (gastroesophageal reflux disease)   . Hyperlipidemia   . Hyperlipidemia, unspecified 01/01/2014  . Hypothyroidism   . Insomnia   . Musculoskeletal chest pain 09/27/2016    Past Surgical History:  Procedure Laterality Date  . ABDOMINAL HYSTERECTOMY    . BREAST BIOPSY Bilateral 90's   benign  . cataracts    . COLON SURGERY     partial colectomy  . intestinal blockage    . NISSEN FUNDOPLICATION      Family History  Problem Relation Age of Onset  . Cervical cancer Mother   . Breast cancer Maternal Aunt     Social History   Socioeconomic History  . Marital status: Married    Spouse name: Not on file  . Number of children: Not on file  . Years of education: Not on file  . Highest education level: Not on file  Occupational History  . Not on file  Tobacco Use  . Smoking status: Never Smoker  . Smokeless tobacco: Never Used  Substance and Sexual Activity  . Alcohol use: No  . Drug use: No  . Sexual activity: Never  Other Topics Concern  . Not on file  Social History Narrative  . Not on file   Social Determinants of Health   Financial Resource Strain:   . Difficulty of Paying Living Expenses:   Food Insecurity:   . Worried About Programme researcher, broadcasting/film/video in the Last Year:   . Barista in the Last Year:   Transportation Needs:   . Freight forwarder (Medical):   [name] Kitchen Lack of Transportation (Non-Medical):   Physical Activity:   . Days of Exercise per Week:   . Minutes of Exercise per Session:   Stress:   . Feeling of Stress :   Social Connections:   . Frequency of Communication  with Friends and Family:   . Frequency of Social Gatherings with Friends and Family:   . Attends Religious Services:   . Active Member of Clubs or Organizations:   . Attends Banker Meetings:   [name] Kitchen Marital Status:   Intimate Partner Violence:   . Fear of Current or Ex-Partner:   . Emotionally Abused:   [name] Kitchen Physically Abused:   . Sexually Abused:      Current Outpatient Medications:  .  acetaminophen (TYLENOL) 500 MG tablet, Take 1,000 mg by mouth every 6 (six) hours as needed., Disp: , Rfl:  .  aspirin 81 MG chewable tablet, Chew 81 mg by mouth daily., Disp: , Rfl:  .  clopidogrel (PLAVIX) 75 MG tablet, Take 1 tablet by mouth once daily, Disp: 90 tablet, Rfl: 0 .  furosemide (LASIX) 40 MG tablet, Take 1 tablet (40 mg total) by mouth daily., Disp: 30 tablet, Rfl: 6 .  levothyroxine (SYNTHROID, LEVOTHROID) 125 MCG tablet, Take 125 mcg by mouth daily before breakfast., Disp: , Rfl:  .  omeprazole (PRILOSEC) 40 MG capsule, Take 40 mg by mouth 2 (two) times daily., Disp: , Rfl:  .  pravastatin (PRAVACHOL) 40 MG tablet,  TAKE 1 TABLET BY MOUTH AT BEDTIME, Disp: 90 tablet, Rfl: 0   Allergies  Allergen Reactions  . Gabapentin Other (See Comments)    Dizziness   . Sulfa Antibiotics Other (See Comments)  . Sulfonamide Derivatives     ROS Review of Systems  Neurological ROS: negative   Objective:    Physical Exam  Constitutional: The patient is oriented to person, place, and time. Pt appears well-developed and well-nourished.  Head: Normocephalic and atraumatic.  Eyes: Pupils are equal, round, and reactive to light.  Neck: No JVD present. No tracheal deviation present. No thyromegaly present.  Cardiovascular: Regular rate and rhythm. No gallop. Pulmonary/Chest: Normal breath sounds. Lungs clear to auscultation. Abdominal: No abdominal tenderness. No guarding or rebound tenderness. No hepatosplenomegaly. Musculoskeletal: Normal range of motion.  Lymphatic: No cervical  adenopathy.  Neurological: No cranial nerve deficit.  Skin: Skin is warm and hydrated.  Psychiatric: The patient has a normal mood and affect.   BP 115/72   Pulse 85   Ht 5\' 5"  (1.651 m)   Wt 152 lb 14.4 oz (69.4 kg)   BMI 25.44 kg/m  Wt Readings from Last 3 Encounters:  02/01/20 152 lb 14.4 oz (69.4 kg)  01/21/20 152 lb 8 oz (69.2 kg)  01/12/20 149 lb 12.8 oz (67.9 kg)     Health Maintenance Due  Topic Date Due  . COVID-19 Vaccine (1) Never done  . TETANUS/TDAP  Never done  . DEXA SCAN  Never done  . PNA vac Low Risk Adult (1 of 2 - PCV13) Never done    There are no preventive care reminders to display for this patient.  Lab Results  Component Value Date   TSH 0.143 (L) 07/18/2016   Lab Results  Component Value Date   WBC 11.1 (H) 04/24/2019   HGB 12.8 04/24/2019   HCT 39.4 04/24/2019   MCV 90.6 04/24/2019   PLT 241 04/24/2019   Lab Results  Component Value Date   NA 141 04/24/2019   K 3.7 04/24/2019   CO2 26 04/24/2019   GLUCOSE 95 04/24/2019   BUN 25 (H) 04/24/2019   CREATININE 0.62 04/24/2019   BILITOT 0.7 04/24/2019   ALKPHOS 52 04/24/2019   AST 19 04/24/2019   ALT 12 04/24/2019   PROT 6.8 04/24/2019   ALBUMIN 4.1 04/24/2019   CALCIUM 9.8 04/24/2019   ANIONGAP 11 04/24/2019   No results found for: CHOL No results found for: HDL No results found for: LDLCALC No results found for: TRIG No results found for: CHOLHDL Lab Results  Component Value Date   HGBA1C 6.3 (H) 09/24/2016      Assessment & Plan:   24-hour Holter report.  .  24-hour Holter monitor was performed to evaluate the dizziness and near passing out spell and palpitation.  Holter revealed predominantly normal sinus rhythm with frequent PACs and occasional frequent PVCs.  No runs of tachycardia arrhythmia noted.  Patient diary does not show history of passing out spell or chest pain.       Problem List Items Addressed This Visit      Other   Dizzinesses - Primary    Relevant Orders   HOLTER MONITOR - 24 HOUR     1. Dizzinesses Will get holter - HOLTER MONITOR - 24 HOUR; Future - HOLTER MONITOR - 24 HOUR  2. Hypothyroidism, unspecified type stable  3. Bruit of right carotid artery stable No orders of the defined types were placed in this encounter.    -  HOLTER MONITOR - 24 HOURFollow-up:  Return in about 4 weeks (around 02/29/2020).  I personally performed the services described in this documentation, which was SCRIBED in my presence. The recorded information has been reviewed and considered accurate. It has been edited as necessary during review. Corky Downs, MD  Corky Downs, MD

## 2020-02-01 NOTE — Assessment & Plan Note (Signed)
holter did not show any bradyarrytmia

## 2020-02-01 NOTE — Assessment & Plan Note (Signed)
stable °

## 2020-02-01 NOTE — Assessment & Plan Note (Signed)
No change 

## 2020-02-03 ENCOUNTER — Ambulatory Visit (INDEPENDENT_AMBULATORY_CARE_PROVIDER_SITE_OTHER): Payer: Medicare Other

## 2020-02-03 ENCOUNTER — Other Ambulatory Visit: Payer: Self-pay

## 2020-02-03 ENCOUNTER — Encounter (INDEPENDENT_AMBULATORY_CARE_PROVIDER_SITE_OTHER): Payer: Self-pay | Admitting: Nurse Practitioner

## 2020-02-03 ENCOUNTER — Ambulatory Visit (INDEPENDENT_AMBULATORY_CARE_PROVIDER_SITE_OTHER): Payer: Medicare Other | Admitting: Nurse Practitioner

## 2020-02-03 VITALS — BP 128/80 | HR 75 | Resp 16

## 2020-02-03 DIAGNOSIS — I10 Essential (primary) hypertension: Secondary | ICD-10-CM | POA: Diagnosis not present

## 2020-02-03 DIAGNOSIS — R600 Localized edema: Secondary | ICD-10-CM | POA: Diagnosis not present

## 2020-02-03 DIAGNOSIS — R0989 Other specified symptoms and signs involving the circulatory and respiratory systems: Secondary | ICD-10-CM

## 2020-02-03 DIAGNOSIS — I890 Lymphedema, not elsewhere classified: Secondary | ICD-10-CM

## 2020-02-03 DIAGNOSIS — M7989 Other specified soft tissue disorders: Secondary | ICD-10-CM

## 2020-02-05 ENCOUNTER — Encounter (INDEPENDENT_AMBULATORY_CARE_PROVIDER_SITE_OTHER): Payer: Self-pay | Admitting: Nurse Practitioner

## 2020-02-05 NOTE — Progress Notes (Signed)
Subjective:    Patient ID: Abigail Strong, female    DOB: 06-24-1930, 86 y.o.   MRN: [id number] Chief Complaint  Patient presents with  . Follow-up    ultrasound follow up    Patient returns today for noninvasive studies related to her carotid arteries.  The patient returns to the office for followup evaluation regarding leg swelling as well the swelling has persisted and the pain associated with swelling continues. There have not been any interval development of a ulcerations or wounds.  Since the previous visit the patient has been wearing graduated compression stockings and has noted little if any improvement in the lymphedema. The patient has been using compression routinely morning until night.  The patient also states elevation during the day and exercise is being done too. [name] Kitchen  She has been utilizing conservative therapy for at least the last 4 weeks and has noted that it has been helpful for controlling her lower extremity edema however some swelling and discomfort still persist.  Today the patient had noninvasive studies related to her carotid artery.  She was told that she had "issues with her neck artery" and that her PCP had wanted it investigated.  The patient denies any TIA or CVA-like symptoms recently.  She denies any amaurosis fugax.  She denies any claudication or rest pain like symptoms.  She denies any history of seizures or migraines.  Today noninvasive studies show that the patient has evidence of no stenosis in the bilateral internal carotid arteries.  Noninvasive studies also show the patient has no evidence of DVT in the bilateral lower extremity veins.  Patient has no evidence of superficial venous stenosis bilaterally.  Patient does have a Baker's cyst behind her right knee measuring 3.15 cm x 1.10 cm.  The patient has no evidence of deep venous insufficiency bilaterally.  The right lower extremity has evidence of superficial venous reflux in the great saphenous vein  beginning at the proximal thigh extending to the proximal calf.  The patient has evidence of superficial venous reflux in the left proximal calf as well.   Review of Systems  Cardiovascular: Positive for leg swelling.  Neurological: Positive for weakness.  All other systems reviewed and are negative.      Objective:   Physical Exam Vitals reviewed.  Neck:     Vascular: Carotid bruit present.  Cardiovascular:     Rate and Rhythm: Normal rate and regular rhythm.     Pulses: Normal pulses.     Heart sounds: Normal heart sounds.  Pulmonary:     Effort: Pulmonary effort is normal.     Breath sounds: Normal breath sounds.  Musculoskeletal:     Right lower leg: 2+ Edema present.     Left lower leg: 2+ Edema present.  Skin:    Comments: Dermal thickening bilaterally with stasis dermatitis  Neurological:     Mental Status: She is alert and oriented to person, place, and time.  Psychiatric:        Mood and Affect: Mood normal.        Behavior: Behavior normal.        Thought Content: Thought content normal.        Judgment: Judgment normal.     BP 128/80 (BP Location: Left Arm)   Pulse 75   Resp 16   Past Medical History:  Diagnosis Date  . Depression   . GERD (gastroesophageal reflux disease)   . Hyperlipidemia   . Hyperlipidemia, unspecified 01/01/2014  .  Hypothyroidism   . Insomnia   . Musculoskeletal chest pain 09/27/2016    Social History   Socioeconomic History  . Marital status: Married    Spouse name: Not on file  . Number of children: Not on file  . Years of education: Not on file  . Highest education level: Not on file  Occupational History  . Not on file  Tobacco Use  . Smoking status: Never Smoker  . Smokeless tobacco: Never Used  Substance and Sexual Activity  . Alcohol use: No  . Drug use: No  . Sexual activity: Never  Other Topics Concern  . Not on file  Social History Narrative  . Not on file   Social Determinants of Health   Financial  Resource Strain:   . Difficulty of Paying Living Expenses:   Food Insecurity:   . Worried About Programme researcher, broadcasting/film/video in the Last Year:   . Barista in the Last Year:   Transportation Needs:   . Freight forwarder (Medical):   [name] Kitchen Lack of Transportation (Non-Medical):   Physical Activity:   . Days of Exercise per Week:   . Minutes of Exercise per Session:   Stress:   . Feeling of Stress :   Social Connections:   . Frequency of Communication with Friends and Family:   . Frequency of Social Gatherings with Friends and Family:   . Attends Religious Services:   . Active Member of Clubs or Organizations:   . Attends Banker Meetings:   [name] Kitchen Marital Status:   Intimate Partner Violence:   . Fear of Current or Ex-Partner:   . Emotionally Abused:   [name] Kitchen Physically Abused:   . Sexually Abused:     Past Surgical History:  Procedure Laterality Date  . ABDOMINAL HYSTERECTOMY    . BREAST BIOPSY Bilateral 90's   benign  . cataracts    . COLON SURGERY     partial colectomy  . intestinal blockage    . NISSEN FUNDOPLICATION      Family History  Problem Relation Age of Onset  . Cervical cancer Mother   . Breast cancer Maternal Aunt     Allergies  Allergen Reactions  . Gabapentin Other (See Comments)    Dizziness   . Sulfa Antibiotics Other (See Comments)  . Sulfonamide Derivatives        Assessment & Plan:   1. Bruit of right carotid artery Non invasive studies reveal no evidence of carotid artery stenosis bilaterally.  No follow-up at this time.  2. Essential hypertension Continue antihypertensive medications as already ordered, these medications have been reviewed and there are no changes at this time.   3. Lymphedema Recommend:  No surgery or intervention at this point in time.    I have reviewed my previous discussion with the patient regarding swelling and why it causes symptoms.  Patient will continue wearing graduated compression stockings class 1  (20-30 mmHg) on a daily basis. The patient will  beginning wearing the stockings first thing in the morning and removing them in the evening. The patient is instructed specifically not to sleep in the stockings.    In addition, behavioral modification including several periods of elevation of the lower extremities during the day will be continued.  This was reviewed with the patient during the initial visit.  The patient will also continue routine exercise, especially walking on a daily basis as was discussed during the initial visit.    Despite conservative  treatments of four weeks including graduated compression therapy class 1 and behavioral modification including exercise and elevation the patient  has not obtained adequate control of the lymphedema.  The patient still has stage 3 lymphedema and therefore, I believe that a lymph pump should be added to improve the control of the patient's lymphedema.  Additionally, a lymph pump is warranted because it will reduce the risk of cellulitis and ulceration in the future.  Patient should follow-up in six months     Current Outpatient Medications on File Prior to Visit  Medication Sig Dispense Refill  . acetaminophen (TYLENOL) 500 MG tablet Take 1,000 mg by mouth every 6 (six) hours as needed.    [name] Kitchen aspirin 81 MG chewable tablet Chew 81 mg by mouth daily.    . clopidogrel (PLAVIX) 75 MG tablet Take 1 tablet by mouth once daily 90 tablet 0  . furosemide (LASIX) 40 MG tablet Take 1 tablet (40 mg total) by mouth daily. 30 tablet 6  . levothyroxine (SYNTHROID, LEVOTHROID) 125 MCG tablet Take 125 mcg by mouth daily before breakfast.    . omeprazole (PRILOSEC) 40 MG capsule Take 40 mg by mouth 2 (two) times daily.    . pravastatin (PRAVACHOL) 40 MG tablet TAKE 1 TABLET BY MOUTH AT BEDTIME 90 tablet 0   No current facility-administered medications on file prior to visit.    There are no Patient Instructions on file for this visit. No follow-ups on  file.   Georgiana Spinner, NP

## 2020-03-01 ENCOUNTER — Telehealth: Payer: Self-pay | Admitting: Internal Medicine

## 2020-03-01 DIAGNOSIS — M170 Bilateral primary osteoarthritis of knee: Secondary | ICD-10-CM | POA: Diagnosis not present

## 2020-03-01 NOTE — Telephone Encounter (Signed)
Abigail Strong would like for you to call her.

## 2020-03-01 NOTE — Telephone Encounter (Signed)
Patient states that she is having pain in her breast and just wanted confirmation that her mammogram was normal, I advised patient that we could schedule her an apt with Dr. Lavera Guise for him to look at her breast. She states that she would call back.

## 2020-03-03 ENCOUNTER — Ambulatory Visit: Payer: Medicare Other | Admitting: Internal Medicine

## 2020-03-16 ENCOUNTER — Other Ambulatory Visit: Payer: Self-pay | Admitting: Internal Medicine

## 2020-03-17 ENCOUNTER — Encounter: Payer: Self-pay | Admitting: Internal Medicine

## 2020-03-17 ENCOUNTER — Ambulatory Visit (INDEPENDENT_AMBULATORY_CARE_PROVIDER_SITE_OTHER): Payer: Medicare Other | Admitting: Internal Medicine

## 2020-03-17 ENCOUNTER — Other Ambulatory Visit: Payer: Self-pay

## 2020-03-17 VITALS — BP 140/67 | HR 62 | Ht 64.0 in | Wt 150.3 lb

## 2020-03-17 DIAGNOSIS — I10 Essential (primary) hypertension: Secondary | ICD-10-CM

## 2020-03-17 DIAGNOSIS — K219 Gastro-esophageal reflux disease without esophagitis: Secondary | ICD-10-CM | POA: Diagnosis not present

## 2020-03-17 DIAGNOSIS — R7303 Prediabetes: Secondary | ICD-10-CM | POA: Diagnosis not present

## 2020-03-17 DIAGNOSIS — M7989 Other specified soft tissue disorders: Secondary | ICD-10-CM | POA: Diagnosis not present

## 2020-03-17 DIAGNOSIS — J441 Chronic obstructive pulmonary disease with (acute) exacerbation: Secondary | ICD-10-CM

## 2020-03-17 DIAGNOSIS — M1710 Unilateral primary osteoarthritis, unspecified knee: Secondary | ICD-10-CM

## 2020-03-17 NOTE — Progress Notes (Signed)
Established Patient Office Visit  SUBJECTIVE:  Subjective  Patient ID: Abigail Strong, female    DOB: 03-02-1930  Age: 86 y.o. MRN: [id number]  CC:  Chief Complaint  Patient presents with  . Hypertension    patient here for blood pressure     HPI Abigail Strong is a 86 y.o. female presenting today for pain in breast but she has gotten a mammogram and it was clear. She has gotten her COVID19 vaccines. Pt still has reflux but takes medication for it. She is having trouble sleeping. Pt takes tylenol or icy hot for her arthritis when it is painful. She takes levothyroxine and cholesterol medication.   Past Medical History:  Diagnosis Date  . Depression   . GERD (gastroesophageal reflux disease)   . Hyperlipidemia   . Hyperlipidemia, unspecified 01/01/2014  . Hypothyroidism   . Insomnia   . Musculoskeletal chest pain 09/27/2016    Past Surgical History:  Procedure Laterality Date  . ABDOMINAL HYSTERECTOMY    . BREAST BIOPSY Bilateral 90's   benign  . cataracts    . COLON SURGERY     partial colectomy  . intestinal blockage    . NISSEN FUNDOPLICATION      Family History  Problem Relation Age of Onset  . Cervical cancer Mother   . Breast cancer Maternal Aunt     Social History   Socioeconomic History  . Marital status: Married    Spouse name: Not on file  . Number of children: Not on file  . Years of education: Not on file  . Highest education level: Not on file  Occupational History  . Not on file  Tobacco Use  . Smoking status: Never Smoker  . Smokeless tobacco: Never Used  Substance and Sexual Activity  . Alcohol use: No  . Drug use: No  . Sexual activity: Never  Other Topics Concern  . Not on file  Social History Narrative  . Not on file   Social Determinants of Health   Financial Resource Strain:   . Difficulty of Paying Living Expenses:   Food Insecurity:   . Worried About Programme researcher, broadcasting/film/video in the Last Year:   . Barista in the  Last Year:   Transportation Needs:   . Freight forwarder (Medical):   [name] Kitchen Lack of Transportation (Non-Medical):   Physical Activity:   . Days of Exercise per Week:   . Minutes of Exercise per Session:   Stress:   . Feeling of Stress :   Social Connections:   . Frequency of Communication with Friends and Family:   . Frequency of Social Gatherings with Friends and Family:   . Attends Religious Services:   . Active Member of Clubs or Organizations:   . Attends Banker Meetings:   [name] Kitchen Marital Status:   Intimate Partner Violence:   . Fear of Current or Ex-Partner:   . Emotionally Abused:   [name] Kitchen Physically Abused:   . Sexually Abused:      Current Outpatient Medications:  .  acetaminophen (TYLENOL) 500 MG tablet, Take 1,000 mg by mouth every 6 (six) hours as needed., Disp: , Rfl:  .  aspirin 81 MG chewable tablet, Chew 81 mg by mouth daily., Disp: , Rfl:  .  clopidogrel (PLAVIX) 75 MG tablet, Take 1 tablet by mouth once daily, Disp: 90 tablet, Rfl: 0 .  furosemide (LASIX) 40 MG tablet, Take 1 tablet (40 mg total) by mouth daily.,  Disp: 30 tablet, Rfl: 6 .  levothyroxine (SYNTHROID, LEVOTHROID) 125 MCG tablet, Take 125 mcg by mouth daily before breakfast., Disp: , Rfl:  .  omeprazole (PRILOSEC) 40 MG capsule, Take 1 capsule by mouth twice daily, Disp: 180 capsule, Rfl: 0 .  pravastatin (PRAVACHOL) 40 MG tablet, TAKE 1 TABLET BY MOUTH AT BEDTIME, Disp: 90 tablet, Rfl: 0   Allergies  Allergen Reactions  . Gabapentin Other (See Comments)    Dizziness   . Sulfa Antibiotics Other (See Comments)  . Sulfonamide Derivatives     ROS Review of Systems  Constitutional: Negative.        Insomnia  HENT: Negative.   Eyes: Negative.   Respiratory: Negative.   Cardiovascular: Negative.   Gastrointestinal: Negative.   Endocrine: Negative.   Genitourinary: Negative.   Musculoskeletal: Negative.        Left chest pain  Skin: Negative.   Allergic/Immunologic: Negative.     Neurological: Negative.   Hematological: Negative.   Psychiatric/Behavioral: Negative.   All other systems reviewed and are negative.    OBJECTIVE:    Physical Exam Vitals reviewed.  Constitutional:      Appearance: Normal appearance.  HENT:     Mouth/Throat:     Mouth: Mucous membranes are moist.  Eyes:     Pupils: Pupils are equal, round, and reactive to light.  Neck:     Vascular: No carotid bruit.  Cardiovascular:     Rate and Rhythm: Normal rate and regular rhythm.     Pulses: Normal pulses.     Heart sounds: Normal heart sounds.  Pulmonary:     Effort: Pulmonary effort is normal.     Breath sounds: Normal breath sounds.  Abdominal:     General: Bowel sounds are normal.     Palpations: Abdomen is soft. There is no hepatomegaly, splenomegaly or mass.     Tenderness: There is no abdominal tenderness.     Hernia: No hernia is present.  Musculoskeletal:        General: No tenderness.     Cervical back: Neck supple.     Right lower leg: No edema.     Left lower leg: No edema.  Skin:    Findings: No rash.  Neurological:     Mental Status: She is alert and oriented to person, place, and time.     Motor: No weakness.  Psychiatric:        Mood and Affect: Mood and affect normal.        Behavior: Behavior normal.     BP (!) 140/67   Pulse 62   Ht 5\' 4"  (1.626 m)   Wt 150 lb 4.8 oz (68.2 kg)   BMI 25.80 kg/m  Wt Readings from Last 3 Encounters:  03/17/20 150 lb 4.8 oz (68.2 kg)  02/01/20 152 lb 14.4 oz (69.4 kg)  01/21/20 152 lb 8 oz (69.2 kg)    Health Maintenance Due  Topic Date Due  . COVID-19 Vaccine (1) Never done  . TETANUS/TDAP  Never done  . DEXA SCAN  Never done  . PNA vac Low Risk Adult (1 of 2 - PCV13) Never done    There are no preventive care reminders to display for this patient.  CBC Latest Ref Rng & Units 04/24/2019 09/29/2018 09/28/2018  WBC 4.0 - 10.5 K/uL 11.1(H) 5.6 10.3  Hemoglobin 12.0 - 15.0 g/dL 16.1 10.9(L) 14.2  Hematocrit 36 -  46 % 39.4 34.5(L) 43.9  Platelets 150 - 400 K/uL  241 173 200   CMP Latest Ref Rng & Units 04/24/2019 09/30/2018 09/29/2018  Glucose 70 - 99 mg/dL 95 93 91  BUN 8 - 23 mg/dL 82(N) 9 18  Creatinine 0.44 - 1.00 mg/dL 5.62 1.30 8.65  Sodium 135 - 145 mmol/L 141 141 141  Potassium 3.5 - 5.1 mmol/L 3.7 4.0 4.0  Chloride 98 - 111 mmol/L 104 109 111  CO2 22 - 32 mmol/L 26 29 27   Calcium 8.9 - 10.3 mg/dL 9.8 7.8(I) 6.9(G)  Total Protein 6.5 - 8.1 g/dL 6.8 - -  Total Bilirubin 0.3 - 1.2 mg/dL 0.7 - -  Alkaline Phos 38 - 126 U/L 52 - -  AST 15 - 41 U/L 19 - -  ALT 0 - 44 U/L 12 - -    Lab Results  Component Value Date   TSH 0.143 (L) 07/18/2016   Lab Results  Component Value Date   ALBUMIN 4.1 04/24/2019   ANIONGAP 11 04/24/2019   No results found for: CHOL, HDL, LDLCALC, CHOLHDL No results found for: TRIG Lab Results  Component Value Date   HGBA1C 6.3 (H) 09/24/2016   HGBA1C 5.8 03/23/2015      ASSESSMENT & PLAN:   Problem List Items Addressed This Visit      Cardiovascular and Mediastinum   Essential hypertension     Respiratory   COPD exacerbation (HCC) - Primary     Digestive   GERD (gastroesophageal reflux disease)    - The patient's GERD is stable on medication.  - Instructed the patient to avoid eating spicy and acidic foods, as well as foods high in fat. - Instructed the patient to avoid eating large meals or meals 2-3 hours prior to sleeping.         Musculoskeletal and Integument   Arthritis of knee    Chronic osteoarthritis getting worse with obesity and age        Other   Prediabetes    Controlled on diet      Swelling of limb    Swelling of the limbs is due to varicous veins. There is no cough tenderness, she is being treated with lasix         No orders of the defined types were placed in this encounter.     Follow-up: Return in about 1 month (around 04/17/2020).    Dr. Woodroe Chen [facility] [address],  [place], [place] [address]   By signing my name below, I, Dorthula Perfect, attest that this documentation has been prepared under the direction and in the presence of Corky Downs, MD. Electronically Signed: Corky Downs, MD 03/18/20, 2:15 PM   I personally performed the services described in this documentation, which was SCRIBED in my presence. The recorded information has been reviewed and considered accurate. It has been edited as necessary during review. Corky Downs, MD

## 2020-03-18 ENCOUNTER — Encounter: Payer: Self-pay | Admitting: Internal Medicine

## 2020-03-18 NOTE — Assessment & Plan Note (Signed)
Controlled on diet.  

## 2020-03-18 NOTE — Assessment & Plan Note (Signed)
Swelling of the limbs is due to varicous veins. There is no cough tenderness, she is being treated with lasix

## 2020-03-18 NOTE — Assessment & Plan Note (Signed)
-   The patient's GERD is stable on medication.  - Instructed the patient to avoid eating spicy and acidic foods, as well as foods high in fat. - Instructed the patient to avoid eating large meals or meals 2-3 hours prior to sleeping. 

## 2020-03-18 NOTE — Assessment & Plan Note (Signed)
Chronic osteoarthritis getting worse with obesity and age

## 2020-04-11 ENCOUNTER — Other Ambulatory Visit: Payer: Self-pay | Admitting: Internal Medicine

## 2020-04-12 ENCOUNTER — Other Ambulatory Visit: Payer: Self-pay | Admitting: Internal Medicine

## 2020-04-15 ENCOUNTER — Ambulatory Visit: Payer: Medicare Other | Admitting: Internal Medicine

## 2020-04-26 ENCOUNTER — Other Ambulatory Visit: Payer: Self-pay

## 2020-04-26 ENCOUNTER — Encounter: Payer: Self-pay | Admitting: Internal Medicine

## 2020-04-26 ENCOUNTER — Ambulatory Visit (INDEPENDENT_AMBULATORY_CARE_PROVIDER_SITE_OTHER): Payer: Medicare Other | Admitting: Internal Medicine

## 2020-04-26 VITALS — BP 119/63 | HR 82 | Ht 64.0 in | Wt 149.9 lb

## 2020-04-26 DIAGNOSIS — M170 Bilateral primary osteoarthritis of knee: Secondary | ICD-10-CM | POA: Diagnosis not present

## 2020-04-26 DIAGNOSIS — Z0000 Encounter for general adult medical examination without abnormal findings: Secondary | ICD-10-CM

## 2020-04-26 DIAGNOSIS — E039 Hypothyroidism, unspecified: Secondary | ICD-10-CM

## 2020-04-26 DIAGNOSIS — R0989 Other specified symptoms and signs involving the circulatory and respiratory systems: Secondary | ICD-10-CM

## 2020-04-26 DIAGNOSIS — I10 Essential (primary) hypertension: Secondary | ICD-10-CM | POA: Diagnosis not present

## 2020-04-26 DIAGNOSIS — R42 Dizziness and giddiness: Secondary | ICD-10-CM

## 2020-04-26 DIAGNOSIS — Z23 Encounter for immunization: Secondary | ICD-10-CM | POA: Diagnosis not present

## 2020-04-26 DIAGNOSIS — J441 Chronic obstructive pulmonary disease with (acute) exacerbation: Secondary | ICD-10-CM

## 2020-04-26 DIAGNOSIS — R5382 Chronic fatigue, unspecified: Secondary | ICD-10-CM

## 2020-04-26 NOTE — Progress Notes (Signed)
Established Patient Office Visit  SUBJECTIVE:  Subjective  Patient ID: Abigail Strong, female    DOB: 07/26/1930  Age: 86 y.o. MRN: [id number]  CC:  Chief Complaint  Patient presents with  . Annual Exam    HPI Abigail Strong is a 86 y.o. female presenting today for an annual physical.   She continues to complain of bilateral chest wall pain, left worse than right. Mammogram on 01/21/2020 revealed no mammographic evidence of malignancy.    Her blood pressure today is 119/63.    She states that melatonin is not helping her to sleep. She continues to have an overactive bladder and needs to use the bathroom frequently. Then she also has difficulties with her CPAP mask slipping from her face.   She is vaccinated against COVID19. She will get her flu vaccine today.   She would like a prescription for a Rolator without a seat.    Past Medical History:  Diagnosis Date  . Depression   . GERD (gastroesophageal reflux disease)   . Hyperlipidemia   . Hyperlipidemia, unspecified 01/01/2014  . Hypothyroidism   . Insomnia   . Musculoskeletal chest pain 09/27/2016    Past Surgical History:  Procedure Laterality Date  . ABDOMINAL HYSTERECTOMY    . BREAST BIOPSY Bilateral 90's   benign  . cataracts    . COLON SURGERY     partial colectomy  . intestinal blockage    . NISSEN FUNDOPLICATION      Family History  Problem Relation Age of Onset  . Cervical cancer Mother   . Breast cancer Maternal Aunt     Social History   Socioeconomic History  . Marital status: Married    Spouse name: Not on file  . Number of children: Not on file  . Years of education: Not on file  . Highest education level: Not on file  Occupational History  . Not on file  Tobacco Use  . Smoking status: Never Smoker  . Smokeless tobacco: Never Used  Substance and Sexual Activity  . Alcohol use: No  . Drug use: No  . Sexual activity: Never  Other Topics Concern  . Not on file  Social History  Narrative  . Not on file   Social Determinants of Health   Financial Resource Strain:   . Difficulty of Paying Living Expenses: Not on file  Food Insecurity:   . Worried About Programme researcher, broadcasting/film/video in the Last Year: Not on file  . Ran Out of Food in the Last Year: Not on file  Transportation Needs:   . Lack of Transportation (Medical): Not on file  . Lack of Transportation (Non-Medical): Not on file  Physical Activity:   . Days of Exercise per Week: Not on file  . Minutes of Exercise per Session: Not on file  Stress:   . Feeling of Stress : Not on file  Social Connections:   . Frequency of Communication with Friends and Family: Not on file  . Frequency of Social Gatherings with Friends and Family: Not on file  . Attends Religious Services: Not on file  . Active Member of Clubs or Organizations: Not on file  . Attends Banker Meetings: Not on file  . Marital Status: Not on file  Intimate Partner Violence:   . Fear of Current or Ex-Partner: Not on file  . Emotionally Abused: Not on file  . Physically Abused: Not on file  . Sexually Abused: Not on file  Current Outpatient Medications:  .  acetaminophen (TYLENOL) 500 MG tablet, Take 1,000 mg by mouth every 6 (six) hours as needed., Disp: , Rfl:  .  aspirin 81 MG chewable tablet, Chew 81 mg by mouth daily., Disp: , Rfl:  .  clopidogrel (PLAVIX) 75 MG tablet, Take 1 tablet by mouth once daily, Disp: 90 tablet, Rfl: 0 .  furosemide (LASIX) 40 MG tablet, Take 1 tablet (40 mg total) by mouth daily., Disp: 30 tablet, Rfl: 6 .  levothyroxine (SYNTHROID, LEVOTHROID) 125 MCG tablet, Take 125 mcg by mouth daily before breakfast., Disp: , Rfl:  .  omeprazole (PRILOSEC) 40 MG capsule, Take 1 capsule by mouth twice daily, Disp: 180 capsule, Rfl: 0 .  pravastatin (PRAVACHOL) 40 MG tablet, TAKE 1 TABLET BY MOUTH AT BEDTIME, Disp: 90 tablet, Rfl: 0 .  meloxicam (MOBIC) 7.5 MG tablet, Take 1 tablet by mouth once daily, Disp: 90  tablet, Rfl: 0   Allergies  Allergen Reactions  . Gabapentin Other (See Comments)    Dizziness   . Sulfa Antibiotics Other (See Comments)  . Sulfonamide Derivatives     ROS Review of Systems  Constitutional: Positive for unexpected weight change (down 3 lbs in 3 months).  HENT: Negative.   Eyes: Negative.   Respiratory: Negative.   Cardiovascular: Negative.   Gastrointestinal: Negative.   Endocrine: Negative.   Genitourinary: Positive for frequency.  Musculoskeletal: Positive for arthralgias and myalgias.       Breast pain L>R  Skin: Negative.   Allergic/Immunologic: Negative.   Neurological: Negative.   Hematological: Negative.   Psychiatric/Behavioral: Positive for sleep disturbance.  All other systems reviewed and are negative.    OBJECTIVE:    Physical Exam Vitals reviewed.  Constitutional:      Appearance: Normal appearance.  HENT:     Right Ear: Decreased hearing noted.     Mouth/Throat:     Mouth: Mucous membranes are moist.  Eyes:     Pupils: Pupils are equal, round, and reactive to light.  Neck:     Vascular: No carotid bruit.  Cardiovascular:     Rate and Rhythm: Normal rate and regular rhythm.     Pulses: Normal pulses.     Heart sounds: Normal heart sounds.  Pulmonary:     Effort: Pulmonary effort is normal.     Breath sounds: Normal breath sounds.  Abdominal:     General: Bowel sounds are normal.     Palpations: Abdomen is soft. There is no hepatomegaly, splenomegaly or mass.     Tenderness: There is no abdominal tenderness.     Hernia: No hernia is present.  Musculoskeletal:        General: No tenderness.     Cervical back: Neck supple.     Right lower leg: No edema.     Left lower leg: No edema.     Comments: Kyphosis of the back.   Skin:    Findings: No rash.  Neurological:     Mental Status: She is alert and oriented to person, place, and time.     Motor: No weakness.  Psychiatric:        Mood and Affect: Mood and affect normal.          Behavior: Behavior normal.     BP 119/63   Pulse 82   Ht 5\' 4"  (1.626 m)   Wt 149 lb 14.4 oz (68 kg)   BMI 25.73 kg/m  Wt Readings from Last 3 Encounters:  05/12/20  140 lb 1.6 oz (63.5 kg)  04/26/20 149 lb 14.4 oz (68 kg)  03/17/20 150 lb 4.8 oz (68.2 kg)    Health Maintenance Due  Topic Date Due  . COVID-19 Vaccine (1) Never done  . TETANUS/TDAP  Never done  . DEXA SCAN  Never done  . PNA vac Low Risk Adult (1 of 2 - PCV13) Never done    There are no preventive care reminders to display for this patient.  CBC Latest Ref Rng & Units 04/26/2020 04/24/2019 09/29/2018  WBC 3.8 - 10.8 Thousand/uL 11.2(H) 11.1(H) 5.6  Hemoglobin 11.7 - 15.5 g/dL 82.9 56.2 10.9(L)  Hematocrit 35 - 45 % 39.9 39.4 34.5(L)  Platelets 140 - 400 Thousand/uL 249 241 173   CMP Latest Ref Rng & Units 04/26/2020 04/24/2019 09/30/2018  Glucose 65 - 99 mg/dL 81 95 93  BUN 7 - 25 mg/dL 13(Y) 86(V) 9  Creatinine 0.60 - 0.88 mg/dL 7.84 6.96 2.95  Sodium 135 - 146 mmol/L 140 141 141  Potassium 3.5 - 5.3 mmol/L 4.4 3.7 4.0  Chloride 98 - 110 mmol/L 103 104 109  CO2 20 - 32 mmol/L 27 26 29   Calcium 8.6 - 10.4 mg/dL 9.6 9.8 2.8(U)  Total Protein 6.1 - 8.1 g/dL 6.4 6.8 -  Total Bilirubin 0.2 - 1.2 mg/dL 0.6 0.7 -  Alkaline Phos 38 - 126 U/L - 52 -  AST 10 - 35 U/L 15 19 -  ALT 6 - 29 U/L 8 12 -    Lab Results  Component Value Date   TSH 0.11 (L) 04/26/2020   Lab Results  Component Value Date   ALBUMIN 4.1 04/24/2019   ANIONGAP 11 04/24/2019   Lab Results  Component Value Date   CHOL 182 04/26/2020   HDL 71 04/26/2020   LDLCALC 92 04/26/2020   CHOLHDL 2.6 04/26/2020   Lab Results  Component Value Date   TRIG 97 04/26/2020   Lab Results  Component Value Date   HGBA1C 6.3 (H) 09/24/2016   HGBA1C 5.8 03/23/2015      ASSESSMENT & PLAN:   Problem List Items Addressed This Visit      Cardiovascular and Mediastinum   Essential hypertension - Primary    - Today, the patient's blood pressure  is well managed on lasix. - The patient will continue the current treatment regimen.  - I encouraged the patient to eat a low-sodium diet to help control blood pressure. - I encouraged the patient to live an active lifestyle and complete activities that increases heart rate to 85% target heart rate at least 5 times per week for one hour.       Relevant Orders   CBC with Differential/Platelet (Completed)   COMPLETE METABOLIC PANEL WITH GFR (Completed)   Lipid panel (Completed)     Respiratory   COPD exacerbation (HCC)     Endocrine   Hypothyroidism, unspecified    Pt is on 125 mcg of Levothyroxin PO daily      Relevant Orders   TSH (Completed)     Musculoskeletal and Integument   Osteoarthritis of both knees    Pt is taking tylenol 500mg , 2 tablets every 6 hours as needed for pain.       Relevant Orders   Walker standard     Other   Chronic fatigue, unspecified    Pt is not depressed. She was advised to walk on a daily basis.       Bruit of right carotid artery  Pt was advised to continue baby aspirin PO daily.       Dizzinesses    Pt was advised to use OTC Claritin.       Annual physical exam    Pt's memory is good. She does not seem to be depressed. She has received her COVID19 vaccine.       Need for influenza vaccination    Pt will receive her flu vaccine today.       Relevant Orders   Flu Vaccine QUAD High Dose(Fluad) (Completed)      No orders of the defined types were placed in this encounter.   Follow-up: Return in about 1 month (around 05/26/2020).    Dr. Woodroe Chen [facility] [address] [place], [place] [address]   By signing my name below, I, [employer], attest that this documentation has been prepared under the direction and in the presence of Corky Downs, MD. Electronically Signed: Corky Downs, MD 05/17/20, 12:13 PM   I personally performed the services described in this documentation, which was SCRIBED in my  presence. The recorded information has been reviewed and considered accurate. It has been edited as necessary during review. Corky Downs, MD

## 2020-04-26 NOTE — Patient Instructions (Signed)
Please bring your CPAP mask with you at your next visit so we can make sure it is fitting right.

## 2020-04-27 LAB — CBC WITH DIFFERENTIAL/PLATELET
Absolute Monocytes: 1008 cells/uL — ABNORMAL HIGH (ref 200–950)
Basophils Absolute: 90 cells/uL (ref 0–200)
Basophils Relative: 0.8 %
Eosinophils Absolute: 134 cells/uL (ref 15–500)
Eosinophils Relative: 1.2 %
HCT: 39.9 % (ref 35.0–45.0)
Hemoglobin: 13.3 g/dL (ref 11.7–15.5)
Lymphs Abs: 4670 cells/uL — ABNORMAL HIGH (ref 850–3900)
MCH: 30.1 pg (ref 27.0–33.0)
MCHC: 33.3 g/dL (ref 32.0–36.0)
MCV: 90.3 fL (ref 80.0–100.0)
MPV: 11.3 fL (ref 7.5–12.5)
Monocytes Relative: 9 %
Neutro Abs: 5298 cells/uL (ref 1500–7800)
Neutrophils Relative %: 47.3 %
Platelets: 249 10*3/uL (ref 140–400)
RBC: 4.42 10*6/uL (ref 3.80–5.10)
RDW: 13.1 % (ref 11.0–15.0)
Total Lymphocyte: 41.7 %
WBC: 11.2 10*3/uL — ABNORMAL HIGH (ref 3.8–10.8)

## 2020-04-27 LAB — COMPLETE METABOLIC PANEL WITH GFR
AG Ratio: 2 (calc) (ref 1.0–2.5)
ALT: 8 U/L (ref 6–29)
AST: 15 U/L (ref 10–35)
Albumin: 4.3 g/dL (ref 3.6–5.1)
Alkaline phosphatase (APISO): 56 U/L (ref 37–153)
BUN/Creatinine Ratio: 34 (calc) — ABNORMAL HIGH (ref 6–22)
BUN: 26 mg/dL — ABNORMAL HIGH (ref 7–25)
CO2: 27 mmol/L (ref 20–32)
Calcium: 9.6 mg/dL (ref 8.6–10.4)
Chloride: 103 mmol/L (ref 98–110)
Creat: 0.76 mg/dL (ref 0.60–0.88)
GFR, Est African American: 82 mL/min/{1.73_m2} (ref >=60)
GFR, Est Non African American: 71 mL/min/{1.73_m2} (ref >=60)
Globulin: 2.1 g/dL (calc) (ref 1.9–3.7)
Glucose, Bld: 81 mg/dL (ref 65–99)
Potassium: 4.4 mmol/L (ref 3.5–5.3)
Sodium: 140 mmol/L (ref 135–146)
Total Bilirubin: 0.6 mg/dL (ref 0.2–1.2)
Total Protein: 6.4 g/dL (ref 6.1–8.1)

## 2020-04-27 LAB — LIPID PANEL
Cholesterol: 182 mg/dL (ref <200)
HDL: 71 mg/dL (ref >=50)
LDL Cholesterol (Calc): 92 mg/dL (calc)
Non-HDL Cholesterol (Calc): 111 mg/dL (calc) (ref <130)
Total CHOL/HDL Ratio: 2.6 (calc) (ref <5.0)
Triglycerides: 97 mg/dL (ref <150)

## 2020-04-27 LAB — TSH: TSH: 0.11 mIU/L — ABNORMAL LOW (ref 0.40–4.50)

## 2020-05-10 ENCOUNTER — Other Ambulatory Visit: Payer: Self-pay | Admitting: Internal Medicine

## 2020-05-12 ENCOUNTER — Ambulatory Visit (INDEPENDENT_AMBULATORY_CARE_PROVIDER_SITE_OTHER): Payer: Medicare Other | Admitting: Internal Medicine

## 2020-05-12 ENCOUNTER — Other Ambulatory Visit: Payer: Self-pay

## 2020-05-12 ENCOUNTER — Encounter: Payer: Self-pay | Admitting: Internal Medicine

## 2020-05-12 VITALS — BP 135/67 | HR 83 | Ht 64.0 in | Wt 140.1 lb

## 2020-05-12 DIAGNOSIS — I10 Essential (primary) hypertension: Secondary | ICD-10-CM | POA: Diagnosis not present

## 2020-05-12 DIAGNOSIS — R5382 Chronic fatigue, unspecified: Secondary | ICD-10-CM | POA: Diagnosis not present

## 2020-05-12 DIAGNOSIS — R0989 Other specified symptoms and signs involving the circulatory and respiratory systems: Secondary | ICD-10-CM | POA: Diagnosis not present

## 2020-05-12 NOTE — Assessment & Plan Note (Signed)
Blood pressure is stable on today's examination.  Lab tests are normal with normal kidney function test

## 2020-05-12 NOTE — Assessment & Plan Note (Signed)
All the labs done on 9 4 were reviewed with the patient her liver function tests are normal and kidney function tests are within normal range.  Cholesterol is normal.

## 2020-05-12 NOTE — Progress Notes (Signed)
Established Patient Office Visit  Subjective:  Patient ID: Abigail Strong, female    DOB: 1929-08-23  Age: 86 y.o. MRN: [id number]  CC:  Chief Complaint  Patient presents with  . Lab results    HPI  Abigail Strong presents for lab follow up  Past Medical History:  Diagnosis Date  . Depression   . GERD (gastroesophageal reflux disease)   . Hyperlipidemia   . Hyperlipidemia, unspecified 01/01/2014  . Hypothyroidism   . Insomnia   . Musculoskeletal chest pain 09/27/2016    Past Surgical History:  Procedure Laterality Date  . ABDOMINAL HYSTERECTOMY    . BREAST BIOPSY Bilateral 90's   benign  . cataracts    . COLON SURGERY     partial colectomy  . intestinal blockage    . NISSEN FUNDOPLICATION      Family History  Problem Relation Age of Onset  . Cervical cancer Mother   . Breast cancer Maternal Aunt     Social History   Socioeconomic History  . Marital status: Married    Spouse name: Not on file  . Number of children: Not on file  . Years of education: Not on file  . Highest education level: Not on file  Occupational History  . Not on file  Tobacco Use  . Smoking status: Never Smoker  . Smokeless tobacco: Never Used  Substance and Sexual Activity  . Alcohol use: No  . Drug use: No  . Sexual activity: Never  Other Topics Concern  . Not on file  Social History Narrative  . Not on file   Social Determinants of Health   Financial Resource Strain:   . Difficulty of Paying Living Expenses: Not on file  Food Insecurity:   . Worried About Programme researcher, broadcasting/film/video in the Last Year: Not on file  . Ran Out of Food in the Last Year: Not on file  Transportation Needs:   . Lack of Transportation (Medical): Not on file  . Lack of Transportation (Non-Medical): Not on file  Physical Activity:   . Days of Exercise per Week: Not on file  . Minutes of Exercise per Session: Not on file  Stress:   . Feeling of Stress : Not on file  Social Connections:   .  Frequency of Communication with Friends and Family: Not on file  . Frequency of Social Gatherings with Friends and Family: Not on file  . Attends Religious Services: Not on file  . Active Member of Clubs or Organizations: Not on file  . Attends Banker Meetings: Not on file  . Marital Status: Not on file  Intimate Partner Violence:   . Fear of Current or Ex-Partner: Not on file  . Emotionally Abused: Not on file  . Physically Abused: Not on file  . Sexually Abused: Not on file     Current Outpatient Medications:  .  acetaminophen (TYLENOL) 500 MG tablet, Take 1,000 mg by mouth every 6 (six) hours as needed., Disp: , Rfl:  .  aspirin 81 MG chewable tablet, Chew 81 mg by mouth daily., Disp: , Rfl:  .  clopidogrel (PLAVIX) 75 MG tablet, Take 1 tablet by mouth once daily, Disp: 90 tablet, Rfl: 0 .  furosemide (LASIX) 40 MG tablet, Take 1 tablet (40 mg total) by mouth daily., Disp: 30 tablet, Rfl: 6 .  levothyroxine (SYNTHROID, LEVOTHROID) 125 MCG tablet, Take 125 mcg by mouth daily before breakfast., Disp: , Rfl:  .  meloxicam (MOBIC)  7.5 MG tablet, Take 1 tablet by mouth once daily, Disp: 90 tablet, Rfl: 0 .  omeprazole (PRILOSEC) 40 MG capsule, Take 1 capsule by mouth twice daily, Disp: 180 capsule, Rfl: 0 .  pravastatin (PRAVACHOL) 40 MG tablet, TAKE 1 TABLET BY MOUTH AT BEDTIME, Disp: 90 tablet, Rfl: 0   Allergies  Allergen Reactions  . Gabapentin Other (See Comments)    Dizziness   . Sulfa Antibiotics Other (See Comments)  . Sulfonamide Derivatives     ROS Review of Systems  Constitutional: Negative.   HENT: Negative.   Eyes: Negative.   Respiratory: Negative.   Cardiovascular: Negative.   Gastrointestinal: Negative.   Endocrine: Negative.   Genitourinary: Negative.   Musculoskeletal: Negative.   Skin: Negative.   Allergic/Immunologic: Negative.   Neurological: Negative.   Hematological: Negative.   Psychiatric/Behavioral: Negative.   All other systems  reviewed and are negative.     Objective:    Physical Exam Vitals reviewed.  Constitutional:      Appearance: Normal appearance.  HENT:     Mouth/Throat:     Mouth: Mucous membranes are moist.  Eyes:     Pupils: Pupils are equal, round, and reactive to light.  Neck:     Vascular: No carotid bruit.  Cardiovascular:     Rate and Rhythm: Normal rate and regular rhythm.     Pulses: Normal pulses.     Heart sounds: Normal heart sounds.  Pulmonary:     Effort: Pulmonary effort is normal.     Breath sounds: Normal breath sounds.  Abdominal:     General: Bowel sounds are normal.     Palpations: Abdomen is soft. There is no hepatomegaly, splenomegaly or mass.     Tenderness: There is no abdominal tenderness.     Hernia: No hernia is present.  Musculoskeletal:        General: No tenderness.     Cervical back: Neck supple.     Right lower leg: No edema.     Left lower leg: No edema.  Skin:    Findings: No rash.  Neurological:     Mental Status: She is alert and oriented to person, place, and time.     Motor: No weakness.  Psychiatric:        Mood and Affect: Mood and affect normal.        Behavior: Behavior normal.     BP 135/67   Pulse 83   Ht 5\' 4"  (1.626 m)   Wt 140 lb 1.6 oz (63.5 kg)   BMI 24.05 kg/m  Wt Readings from Last 3 Encounters:  05/12/20 140 lb 1.6 oz (63.5 kg)  04/26/20 149 lb 14.4 oz (68 kg)  03/17/20 150 lb 4.8 oz (68.2 kg)     Health Maintenance Due  Topic Date Due  . COVID-19 Vaccine (1) Never done  . TETANUS/TDAP  Never done  . DEXA SCAN  Never done  . PNA vac Low Risk Adult (1 of 2 - PCV13) Never done    There are no preventive care reminders to display for this patient.  Lab Results  Component Value Date   TSH 0.11 (L) 04/26/2020   Lab Results  Component Value Date   WBC 11.2 (H) 04/26/2020   HGB 13.3 04/26/2020   HCT 39.9 04/26/2020   MCV 90.3 04/26/2020   PLT 249 04/26/2020   Lab Results  Component Value Date   NA 140  04/26/2020   K 4.4 04/26/2020   CO2 27  04/26/2020   GLUCOSE 81 04/26/2020   BUN 26 (H) 04/26/2020   CREATININE 0.76 04/26/2020   BILITOT 0.6 04/26/2020   ALKPHOS 52 04/24/2019   AST 15 04/26/2020   ALT 8 04/26/2020   PROT 6.4 04/26/2020   ALBUMIN 4.1 04/24/2019   CALCIUM 9.6 04/26/2020   ANIONGAP 11 04/24/2019   Lab Results  Component Value Date   CHOL 182 04/26/2020   Lab Results  Component Value Date   HDL 71 04/26/2020   Lab Results  Component Value Date   LDLCALC 92 04/26/2020   Lab Results  Component Value Date   TRIG 97 04/26/2020   Lab Results  Component Value Date   CHOLHDL 2.6 04/26/2020   Lab Results  Component Value Date   HGBA1C 6.3 (H) 09/24/2016      Assessment & Plan:   Problem List Items Addressed This Visit      Cardiovascular and Mediastinum   Essential hypertension - Primary    Blood pressure is stable on today's examination.  Lab tests are normal with normal kidney function test        Other   Chronic fatigue, unspecified    All the labs done on 9 4 were reviewed with the patient her liver function tests are normal and kidney function tests are within normal range.  Cholesterol is normal.      Bruit of right carotid artery    Patient complains of decreased memory, no neurological signs were noted.  She does not seem to be depressed there is no evidence of early dementia.         No orders of the defined types were placed in this encounter.   Follow-up: No follow-ups on file.    Corky Downs, MD

## 2020-05-12 NOTE — Assessment & Plan Note (Signed)
Patient complains of decreased memory, no neurological signs were noted.  She does not seem to be depressed there is no evidence of early dementia.

## 2020-05-17 ENCOUNTER — Encounter: Payer: Self-pay | Admitting: Internal Medicine

## 2020-05-17 DIAGNOSIS — Z0000 Encounter for general adult medical examination without abnormal findings: Secondary | ICD-10-CM | POA: Insufficient documentation

## 2020-05-17 DIAGNOSIS — Z23 Encounter for immunization: Secondary | ICD-10-CM | POA: Insufficient documentation

## 2020-05-17 NOTE — Assessment & Plan Note (Signed)
-   Today, the patient's blood pressure is well managed on lasix. - The patient will continue the current treatment regimen.  - I encouraged the patient to eat a low-sodium diet to help control blood pressure. - I encouraged the patient to live an active lifestyle and complete activities that increases heart rate to 85% target heart rate at least 5 times per week for one hour.

## 2020-05-17 NOTE — Assessment & Plan Note (Signed)
Pt will receive her flu vaccine today.

## 2020-05-17 NOTE — Assessment & Plan Note (Signed)
Pt is taking tylenol 500mg , 2 tablets every 6 hours as needed for pain.

## 2020-05-17 NOTE — Assessment & Plan Note (Signed)
Pt was advised to use OTC Claritin.

## 2020-05-17 NOTE — Assessment & Plan Note (Signed)
Pt is on 125 mcg of Levothyroxin PO daily

## 2020-05-17 NOTE — Assessment & Plan Note (Signed)
Pt was advised to continue baby aspirin PO daily.

## 2020-05-17 NOTE — Assessment & Plan Note (Signed)
Pt's memory is good. She does not seem to be depressed. She has received her COVID19 vaccine.

## 2020-05-17 NOTE — Assessment & Plan Note (Signed)
Pt is not depressed. She was advised to walk on a daily basis.

## 2020-05-19 ENCOUNTER — Other Ambulatory Visit: Payer: Self-pay | Admitting: *Deleted

## 2020-05-19 DIAGNOSIS — N3281 Overactive bladder: Secondary | ICD-10-CM

## 2020-05-19 NOTE — Progress Notes (Signed)
mb

## 2020-06-10 ENCOUNTER — Other Ambulatory Visit: Payer: Self-pay | Admitting: Internal Medicine

## 2020-06-14 ENCOUNTER — Other Ambulatory Visit: Payer: Self-pay | Admitting: Internal Medicine

## 2020-06-16 ENCOUNTER — Encounter: Payer: Self-pay | Admitting: Urology

## 2020-06-16 ENCOUNTER — Other Ambulatory Visit: Payer: Self-pay

## 2020-06-16 ENCOUNTER — Ambulatory Visit: Payer: Medicare Other | Admitting: Urology

## 2020-06-16 VITALS — BP 109/73 | HR 78 | Ht 64.0 in | Wt 140.0 lb

## 2020-06-16 DIAGNOSIS — N3281 Overactive bladder: Secondary | ICD-10-CM

## 2020-06-16 DIAGNOSIS — R350 Frequency of micturition: Secondary | ICD-10-CM | POA: Diagnosis not present

## 2020-06-16 DIAGNOSIS — R351 Nocturia: Secondary | ICD-10-CM | POA: Diagnosis not present

## 2020-06-16 LAB — BLADDER SCAN AMB NON-IMAGING

## 2020-06-16 NOTE — Progress Notes (Signed)
06/16/2020 4:06 PM   [name] 09-12-1929 [id number]  Reason for visit: OAB and nocturia  HPI: I saw Ms. Manson Passey in urology clinic today for evaluation of OAB and nocturia.  She was last seen in our clinic by Michiel Cowboy in May 2019 for OAB.  At that time she was on Myrbetriq 25 mg daily that significantly improved her urinary frequency, however the medication was unaffordable at that time.  She was not a candidate for anticholinergics secondary to her age and frailty, which I certainly agree with.  Her primary urinary complaint today is urinary frequency, as well as nocturia 4-5 times overnight.  She is quite fragile, and getting up and down from bed at night is difficult and sometimes she does not make it to her bedside commode in time and has urge incontinence.  She denies any significant stress incontinence.  She does get significant lower extremity edema requiring stockings, which likely is contributing to her nocturia.  Urinalysis today is completely benign, and PVR is normal at 32 mL.  She drinks only water during the day and denies any bladder irritants.  She denies any dysuria, gross hematuria, or UTIs.  On exam, she is wearing compression stockings, but still has significant lower extremity edema.  We discussed that overactive bladder (OAB) is not a disease, but is a symptom complex that is generally not life-threatening.  Symptoms typically include urinary urgency, frequency, and urge incontinence.  There are numerous treatment options, however there are risks and benefits with both medical and surgical management.  First-line treatment is behavioral therapies including bladder training, pelvic floor muscle training, and fluid management.  Second line treatments include oral antimuscarinics(Ditropan er, Trospium) and beta-3 agonist (Mybetriq). There is typically a period of medication trial (4-8 weeks) to find the optimal therapy and dosing. If symptoms are bothersome despite  the above management, third line options include intra-detrusor botox, peripheral tibial nerve stimulation (PTNS), and interstim (SNS). These are more invasive treatments with higher side effect profile, but may improve quality of life for patients with severe OAB symptoms.   -Samples of Myrbetriq given, will work with insurance/pharmacy to obtain at lower cost -Recommended compression stockings with elevation of the legs in the afternoon to avoid recirculation of fluid overnight and nocturia -RTC 1 month with Michiel Cowboy, PA   Sondra Come, MD  [facility] [address] [place], [place] [address] [phone]

## 2020-06-17 LAB — URINALYSIS, COMPLETE
Bilirubin, UA: NEGATIVE
Glucose, UA: NEGATIVE
Ketones, UA: NEGATIVE
Leukocytes,UA: NEGATIVE
Nitrite, UA: NEGATIVE
Protein,UA: NEGATIVE
RBC, UA: NEGATIVE
Specific Gravity, UA: 1.015 (ref 1.005–1.030)
Urobilinogen, Ur: 0.2 mg/dL (ref 0.2–1.0)
pH, UA: 6 (ref 5.0–7.5)

## 2020-06-17 LAB — MICROSCOPIC EXAMINATION: Bacteria, UA: NONE SEEN

## 2020-06-24 DIAGNOSIS — M170 Bilateral primary osteoarthritis of knee: Secondary | ICD-10-CM | POA: Diagnosis not present

## 2020-07-10 ENCOUNTER — Other Ambulatory Visit: Payer: Self-pay | Admitting: Internal Medicine

## 2020-07-19 ENCOUNTER — Ambulatory Visit: Payer: Self-pay | Admitting: Urology

## 2020-07-21 ENCOUNTER — Encounter: Payer: Self-pay | Admitting: Internal Medicine

## 2020-07-21 ENCOUNTER — Other Ambulatory Visit: Payer: Self-pay

## 2020-07-21 ENCOUNTER — Ambulatory Visit (INDEPENDENT_AMBULATORY_CARE_PROVIDER_SITE_OTHER): Payer: Medicare Other | Admitting: Internal Medicine

## 2020-07-21 VITALS — BP 137/86 | HR 79 | Ht 64.0 in | Wt 153.1 lb

## 2020-07-21 DIAGNOSIS — M47812 Spondylosis without myelopathy or radiculopathy, cervical region: Secondary | ICD-10-CM

## 2020-07-21 DIAGNOSIS — R0989 Other specified symptoms and signs involving the circulatory and respiratory systems: Secondary | ICD-10-CM

## 2020-07-21 DIAGNOSIS — I10 Essential (primary) hypertension: Secondary | ICD-10-CM | POA: Diagnosis not present

## 2020-07-21 DIAGNOSIS — M170 Bilateral primary osteoarthritis of knee: Secondary | ICD-10-CM

## 2020-07-21 DIAGNOSIS — M792 Neuralgia and neuritis, unspecified: Secondary | ICD-10-CM

## 2020-07-21 MED ORDER — GABAPENTIN 100 MG PO CAPS: 100.0000 mg | ORAL_CAPSULE | Freq: Three times a day (TID) | ORAL | 3 refills | Status: DC

## 2020-07-21 MED ORDER — GABAPENTIN 100 MG PO CAPS
100.0000 mg | ORAL_CAPSULE | Freq: Three times a day (TID) | ORAL | 3 refills | Status: DC
Start: 2020-07-21 — End: 2021-01-23

## 2020-07-21 NOTE — Assessment & Plan Note (Signed)
I advised the patient to get a soft neck brace for the neck pain.  [place] could have increased her arthritic symptoms

## 2020-07-21 NOTE — Assessment & Plan Note (Signed)
Blood pressure is stable on today's examination.  She was advised to continue the present medication she denies chest pain suggestive of coronary disease.  She does not smoke or drink.  She has been following low-salt diet.

## 2020-07-21 NOTE — Assessment & Plan Note (Signed)
Patient has osteoarthritis of the both knees.  I put her on gabapentin starting with 100 mg p.o. twice a day.  It will help the neck pain pleuritic left-sided chest pain and the knee pain.

## 2020-07-21 NOTE — Progress Notes (Signed)
Established Patient Office Visit  Subjective:  Patient ID: Abigail Strong, female    DOB: [birth year]  Age: 86 y.o. MRN: [id number]  CC:  Chief Complaint  Patient presents with  . Hypertension    HPI  Abigail Strong presents for for headache and neck pain left-sided chest pain.  Left-sided chest pain is kind of pleuritic and neck pain is related to movements of the neck.  Patient is known to have osteoporosis osteoarthritis of the cervical spine as well as osteoarthritis of the thoracic spine.  She also complains of tiredness and fatigue.  Patient went to the [place] park   the last week and the travel has also increased the arthritic symptoms.  Past Medical History:  Diagnosis Date  . Depression   . GERD (gastroesophageal reflux disease)   . Hyperlipidemia   . Hyperlipidemia, unspecified 01/01/2014  . Hypothyroidism   . Insomnia   . Musculoskeletal chest pain 09/27/2016    Past Surgical History:  Procedure Laterality Date  . ABDOMINAL HYSTERECTOMY    . BREAST BIOPSY Bilateral 90's   benign  . cataracts    . COLON SURGERY     partial colectomy  . intestinal blockage    . NISSEN FUNDOPLICATION      Family History  Problem Relation Age of Onset  . Cervical cancer Mother   . Breast cancer Maternal Aunt     Social History   Socioeconomic History  . Marital status: Married    Spouse name: Not on file  . Number of children: Not on file  . Years of education: Not on file  . Highest education level: Not on file  Occupational History  . Not on file  Tobacco Use  . Smoking status: Never Smoker  . Smokeless tobacco: Never Used  Substance and Sexual Activity  . Alcohol use: No  . Drug use: No  . Sexual activity: Not Currently    Birth control/protection: Post-menopausal  Other Topics Concern  . Not on file  Social History Narrative  . Not on file   Social Determinants of Health   Financial Resource Strain:   . Difficulty of Paying Living Expenses: Not on  file  Food Insecurity:   . Worried About Programme researcher, broadcasting/film/video in the Last Year: Not on file  . Ran Out of Food in the Last Year: Not on file  Transportation Needs:   . Lack of Transportation (Medical): Not on file  . Lack of Transportation (Non-Medical): Not on file  Physical Activity:   . Days of Exercise per Week: Not on file  . Minutes of Exercise per Session: Not on file  Stress:   . Feeling of Stress : Not on file  Social Connections:   . Frequency of Communication with Friends and Family: Not on file  . Frequency of Social Gatherings with Friends and Family: Not on file  . Attends Religious Services: Not on file  . Active Member of Clubs or Organizations: Not on file  . Attends Banker Meetings: Not on file  . Marital Status: Not on file  Intimate Partner Violence:   . Fear of Current or Ex-Partner: Not on file  . Emotionally Abused: Not on file  . Physically Abused: Not on file  . Sexually Abused: Not on file     Current Outpatient Medications:  .  acetaminophen (TYLENOL) 500 MG tablet, Take 1,000 mg by mouth every 6 (six) hours as needed., Disp: , Rfl:  .  aspirin  81 MG chewable tablet, Chew 81 mg by mouth daily., Disp: , Rfl:  .  clopidogrel (PLAVIX) 75 MG tablet, Take 1 tablet by mouth once daily, Disp: 90 tablet, Rfl: 0 .  furosemide (LASIX) 40 MG tablet, Take 1 tablet (40 mg total) by mouth daily., Disp: 30 tablet, Rfl: 6 .  levothyroxine (SYNTHROID) 125 MCG tablet, Take 1 tablet by mouth once daily, Disp: 90 tablet, Rfl: 0 .  meloxicam (MOBIC) 7.5 MG tablet, Take 1 tablet by mouth once daily, Disp: 90 tablet, Rfl: 0 .  omeprazole (PRILOSEC) 40 MG capsule, Take 1 capsule by mouth twice daily, Disp: 180 capsule, Rfl: 0 .  pravastatin (PRAVACHOL) 40 MG tablet, TAKE 1 TABLET BY MOUTH AT BEDTIME, Disp: 90 tablet, Rfl: 0 .  gabapentin (NEURONTIN) 100 MG capsule, Take 1 capsule (100 mg total) by mouth 3 (three) times daily., Disp: 90 capsule, Rfl: 3   Allergies   Allergen Reactions  . Gabapentin Other (See Comments)    Dizziness   . Sulfa Antibiotics Other (See Comments)  . Sulfonamide Derivatives     ROS Review of Systems  Constitutional: Negative.   HENT: Negative.   Eyes: Negative.   Respiratory: Negative.   Cardiovascular: Negative.   Gastrointestinal: Negative.  Negative for abdominal distention.  Endocrine: Negative.   Genitourinary: Negative.   Musculoskeletal: Positive for arthralgias, back pain, gait problem, myalgias, neck pain and neck stiffness.  Skin: Negative.   Allergic/Immunologic: Negative.   Neurological: Positive for weakness and headaches. Negative for tremors, seizures, syncope, speech difficulty and light-headedness.  Hematological: Negative.   Psychiatric/Behavioral: Negative.  Negative for behavioral problems.  All other systems reviewed and are negative.     Objective:    Physical Exam Vitals reviewed.  Constitutional:      Appearance: Normal appearance.  HENT:     Mouth/Throat:     Mouth: Mucous membranes are moist.  Eyes:     Pupils: Pupils are equal, round, and reactive to light.  Neck:     Vascular: No carotid bruit.     Comments: Movement of the neck on the right side is painful on the left side it is a little bit easy she can move forward and backwards slowly.  She is also tender in the left side of the chest.  Pain in the chest is pleuritic and about 5 /10. Cardiovascular:     Rate and Rhythm: Normal rate and regular rhythm.     Pulses: Normal pulses.     Heart sounds: Normal heart sounds.  Pulmonary:     Effort: Pulmonary effort is normal.     Breath sounds: Normal breath sounds.  Abdominal:     General: Bowel sounds are normal.     Palpations: Abdomen is soft. There is no hepatomegaly, splenomegaly or mass.     Tenderness: There is no abdominal tenderness.     Hernia: No hernia is present.  Musculoskeletal:        General: No tenderness.     Cervical back: Rigidity present.     Right  lower leg: No edema.     Left lower leg: No edema.  Skin:    Findings: No rash.  Neurological:     General: No focal deficit present.     Mental Status: She is alert and oriented to person, place, and time.     Motor: No weakness.  Psychiatric:        Mood and Affect: Mood and affect normal.  Behavior: Behavior normal.     BP 137/86   Pulse 79   Ht 5\' 4"  (1.626 m)   Wt 153 lb 1.6 oz (69.4 kg)   BMI 26.28 kg/m  Wt Readings from Last 3 Encounters:  07/21/20 153 lb 1.6 oz (69.4 kg)  06/16/20 140 lb (63.5 kg)  05/12/20 140 lb 1.6 oz (63.5 kg)     Health Maintenance Due  Topic Date Due  . COVID-19 Vaccine (1) Never done  . TETANUS/TDAP  Never done  . DEXA SCAN  Never done  . PNA vac Low Risk Adult (1 of 2 - PCV13) Never done    There are no preventive care reminders to display for this patient.  Lab Results  Component Value Date   TSH 0.11 (L) 04/26/2020   Lab Results  Component Value Date   WBC 11.2 (H) 04/26/2020   HGB 13.3 04/26/2020   HCT 39.9 04/26/2020   MCV 90.3 04/26/2020   PLT 249 04/26/2020   Lab Results  Component Value Date   NA 140 04/26/2020   K 4.4 04/26/2020   CO2 27 04/26/2020   GLUCOSE 81 04/26/2020   BUN 26 (H) 04/26/2020   CREATININE 0.76 04/26/2020   BILITOT 0.6 04/26/2020   ALKPHOS 52 04/24/2019   AST 15 04/26/2020   ALT 8 04/26/2020   PROT 6.4 04/26/2020   ALBUMIN 4.1 04/24/2019   CALCIUM 9.6 04/26/2020   ANIONGAP 11 04/24/2019   Lab Results  Component Value Date   CHOL 182 04/26/2020   Lab Results  Component Value Date   HDL 71 04/26/2020   Lab Results  Component Value Date   LDLCALC 92 04/26/2020   Lab Results  Component Value Date   TRIG 97 04/26/2020   Lab Results  Component Value Date   CHOLHDL 2.6 04/26/2020   Lab Results  Component Value Date   HGBA1C 6.3 (H) 09/24/2016      Assessment & Plan:   Problem List Items Addressed This Visit      Cardiovascular and Mediastinum   Essential  hypertension - Primary    Blood pressure is stable on today's examination.  She was advised to continue the present medication she denies chest pain suggestive of coronary disease.  She does not smoke or drink.  She has been following low-salt diet.        Musculoskeletal and Integument   Osteoarthritis of both knees    Patient has osteoarthritis of the both knees.  I put her on gabapentin starting with 100 mg p.o. twice a day.  It will help the neck pain pleuritic left-sided chest pain and the knee pain.      Cervical spine arthritis    I advised the patient to get a soft neck brace for the neck pain.  [place] could have [place] her arthritic symptoms        Other   Bruit of right carotid artery    Carotid artery disease is stable she does not have any history of TIA      Neuralgia of chest    Pleuritic pain on the left side of the chest is neuralgia may be coming from the thoracic spine impingement syndrome.  I hope the gabapentin will help her to relieve some of the symptoms she is not a candidate for NSAID therapy.  And I do not want to do steroid         Meds ordered this  encounter  Medications  . DISCONTD: gabapentin (NEURONTIN) 100 MG capsule    Sig: Take 1 capsule (100 mg total) by mouth 3 (three) times daily.    Dispense:  90 capsule    Refill:  3  . gabapentin (NEURONTIN) 100 MG capsule    Sig: Take 1 capsule (100 mg total) by mouth 3 (three) times daily.    Dispense:  90 capsule    Refill:  3    Follow-up: No follow-ups on file.    Corky Downs, MD

## 2020-07-21 NOTE — Assessment & Plan Note (Signed)
Carotid artery disease is stable she does not have any history of TIA

## 2020-07-21 NOTE — Assessment & Plan Note (Signed)
Pleuritic pain on the left side of the chest is neuralgia may be coming from the thoracic spine impingement syndrome.  I hope the gabapentin will help her to relieve some of the symptoms she is not a candidate for NSAID therapy.  And I do not want to do steroid

## 2020-07-28 ENCOUNTER — Ambulatory Visit (INDEPENDENT_AMBULATORY_CARE_PROVIDER_SITE_OTHER): Payer: Medicare Other | Admitting: Family Medicine

## 2020-07-28 ENCOUNTER — Encounter: Payer: Self-pay | Admitting: Family Medicine

## 2020-07-28 ENCOUNTER — Other Ambulatory Visit: Payer: Self-pay

## 2020-07-28 VITALS — BP 125/73 | HR 72 | Ht 65.0 in | Wt 158.3 lb

## 2020-07-28 DIAGNOSIS — M19011 Primary osteoarthritis, right shoulder: Secondary | ICD-10-CM

## 2020-07-28 DIAGNOSIS — M19012 Primary osteoarthritis, left shoulder: Secondary | ICD-10-CM

## 2020-07-28 NOTE — Assessment & Plan Note (Signed)
Patient following up today with bilat shoulder pain that is somewhat improved with the Gabapentin. I am suggesting Tylenol 650 mg 4 x daily as needed and Meloxicam daily as her GFR is over 60. Fu as needed.

## 2020-07-28 NOTE — Progress Notes (Signed)
Established Patient Office Visit  SUBJECTIVE:  Subjective  Patient ID: Abigail Strong, female    DOB: 27-Nov-1929  Age: 86 y.o. MRN: [id number]  CC:  Chief Complaint  Patient presents with  . Follow-up    HPI Abigail Strong is a 86 y.o. female presenting today for     Past Medical History:  Diagnosis Date  . Depression   . GERD (gastroesophageal reflux disease)   . Hyperlipidemia   . Hyperlipidemia, unspecified 01/01/2014  . Hypothyroidism   . Insomnia   . Musculoskeletal chest pain 09/27/2016    Past Surgical History:  Procedure Laterality Date  . ABDOMINAL HYSTERECTOMY    . BREAST BIOPSY Bilateral 90's   benign  . cataracts    . COLON SURGERY     partial colectomy  . intestinal blockage    . NISSEN FUNDOPLICATION      Family History  Problem Relation Age of Onset  . Cervical cancer Mother   . Breast cancer Maternal Aunt     Social History   Socioeconomic History  . Marital status: Married    Spouse name: Not on file  . Number of children: Not on file  . Years of education: Not on file  . Highest education level: Not on file  Occupational History  . Not on file  Tobacco Use  . Smoking status: Never Smoker  . Smokeless tobacco: Never Used  Substance and Sexual Activity  . Alcohol use: No  . Drug use: No  . Sexual activity: Not Currently    Birth control/protection: Post-menopausal  Other Topics Concern  . Not on file  Social History Narrative  . Not on file   Social Determinants of Health   Financial Resource Strain: Not on file  Food Insecurity: Not on file  Transportation Needs: Not on file  Physical Activity: Not on file  Stress: Not on file  Social Connections: Not on file  Intimate Partner Violence: Not on file     Current Outpatient Medications:  .  acetaminophen (TYLENOL) 500 MG tablet, Take 1,000 mg by mouth every 6 (six) hours as needed., Disp: , Rfl:  .  aspirin 81 MG chewable tablet, Chew 81 mg by mouth daily., Disp: ,  Rfl:  .  clopidogrel (PLAVIX) 75 MG tablet, Take 1 tablet by mouth once daily, Disp: 90 tablet, Rfl: 0 .  furosemide (LASIX) 40 MG tablet, Take 1 tablet (40 mg total) by mouth daily., Disp: 30 tablet, Rfl: 6 .  gabapentin (NEURONTIN) 100 MG capsule, Take 1 capsule (100 mg total) by mouth 3 (three) times daily., Disp: 90 capsule, Rfl: 3 .  levothyroxine (SYNTHROID) 125 MCG tablet, Take 1 tablet by mouth once daily, Disp: 90 tablet, Rfl: 0 .  meloxicam (MOBIC) 7.5 MG tablet, Take 1 tablet by mouth once daily, Disp: 90 tablet, Rfl: 0 .  omeprazole (PRILOSEC) 40 MG capsule, Take 1 capsule by mouth twice daily, Disp: 180 capsule, Rfl: 0 .  pravastatin (PRAVACHOL) 40 MG tablet, TAKE 1 TABLET BY MOUTH AT BEDTIME, Disp: 90 tablet, Rfl: 0   Allergies  Allergen Reactions  . Gabapentin Other (See Comments)    Dizziness   . Sulfa Antibiotics Other (See Comments)  . Sulfonamide Derivatives     ROS Review of Systems  Constitutional: Negative.   HENT: Negative.   Respiratory: Negative.   Cardiovascular: Negative.   Musculoskeletal: Negative.   Neurological: Negative.   Hematological: Negative.   Psychiatric/Behavioral: Negative.      OBJECTIVE:  Physical Exam HENT:     Head: Normocephalic.     Right Ear: Tympanic membrane normal.     Mouth/Throat:     Mouth: Mucous membranes are moist.  Eyes:     Pupils: Pupils are equal, round, and reactive to light.  Cardiovascular:     Rate and Rhythm: Normal rate and regular rhythm.  Musculoskeletal:        General: Tenderness and signs of injury present.     Cervical back: Neck supple.  Skin:    General: Skin is warm.  Neurological:     General: No focal deficit present.     Mental Status: She is alert.  Psychiatric:        Mood and Affect: Mood normal.        Thought Content: Thought content normal.     BP 125/73   Pulse 72   Ht 5\' 5"  (1.651 m)   Wt 158 lb 4.8 oz (71.8 kg)   BMI 26.34 kg/m  Wt Readings from Last 3 Encounters:   07/28/20 158 lb 4.8 oz (71.8 kg)  07/21/20 153 lb 1.6 oz (69.4 kg)  06/16/20 140 lb (63.5 kg)    Health Maintenance Due  Topic Date Due  . COVID-19 Vaccine (1) Never done  . TETANUS/TDAP  Never done  . DEXA SCAN  Never done  . PNA vac Low Risk Adult (1 of 2 - PCV13) Never done    There are no preventive care reminders to display for this patient.  CBC Latest Ref Rng & Units 04/26/2020 04/24/2019 09/29/2018  WBC 3.8 - 10.8 Thousand/uL 11.2(H) 11.1(H) 5.6  Hemoglobin 11.7 - 15.5 g/dL 66.4 40.3 10.9(L)  Hematocrit 35.0 - 45.0 % 39.9 39.4 34.5(L)  Platelets 140 - 400 Thousand/uL 249 241 173   CMP Latest Ref Rng & Units 04/26/2020 04/24/2019 09/30/2018  Glucose 65 - 99 mg/dL 81 95 93  BUN 7 - 25 mg/dL 47(Q) 25(Z) 9  Creatinine 0.60 - 0.88 mg/dL 5.63 8.75 6.43  Sodium 135 - 146 mmol/L 140 141 141  Potassium 3.5 - 5.3 mmol/L 4.4 3.7 4.0  Chloride 98 - 110 mmol/L 103 104 109  CO2 20 - 32 mmol/L 27 26 29   Calcium 8.6 - 10.4 mg/dL 9.6 9.8 3.2(R)  Total Protein 6.1 - 8.1 g/dL 6.4 6.8 -  Total Bilirubin 0.2 - 1.2 mg/dL 0.6 0.7 -  Alkaline Phos 38 - 126 U/L - 52 -  AST 10 - 35 U/L 15 19 -  ALT 6 - 29 U/L 8 12 -    Lab Results  Component Value Date   TSH 0.11 (L) 04/26/2020   Lab Results  Component Value Date   ALBUMIN 4.1 04/24/2019   ANIONGAP 11 04/24/2019   Lab Results  Component Value Date   CHOL 182 04/26/2020   HDL 71 04/26/2020   LDLCALC 92 04/26/2020   CHOLHDL 2.6 04/26/2020   Lab Results  Component Value Date   TRIG 97 04/26/2020   Lab Results  Component Value Date   HGBA1C 6.3 (H) 09/24/2016   HGBA1C 5.8 03/23/2015      ASSESSMENT & PLAN:   Problem List Items Addressed This Visit   None     No orders of the defined types were placed in this encounter.     Follow-up: No follow-ups on file.    Irish Lack, FNP [facility] [address] [place], [place] [address]

## 2020-08-09 ENCOUNTER — Other Ambulatory Visit: Payer: Self-pay | Admitting: Internal Medicine

## 2020-08-09 ENCOUNTER — Ambulatory Visit (INDEPENDENT_AMBULATORY_CARE_PROVIDER_SITE_OTHER): Payer: Medicare Other | Admitting: Vascular Surgery

## 2020-08-24 ENCOUNTER — Other Ambulatory Visit: Payer: Self-pay | Admitting: Internal Medicine

## 2020-08-29 NOTE — Progress Notes (Signed)
08/30/2020 8:09 PM   Abigail Strong Jun 06, 1930 [id number]  Referring provider: Corky Downs, MD [address] [place],  [place] [address]  Chief Complaint  Patient presents with  . Urinary Incontinence   Urological history 1. Nocturia - 4 to 5 times nightly  2. Frequency - taking Myrbetriq 25 mg daily  HPI: Abigail Strong is a 87 y.o. female who presents today for a follow up after a trial of Myrbetriq.    She states the Myrbetriq was helpful in reducing her nocturia and frequency.   She is concerned that the medication will not be affordable.  Patient denies any modifying or aggravating factors.  Patient denies any gross hematuria, dysuria or suprapubic/flank pain.  Patient denies any fevers, chills, nausea or vomiting.     PMH: Past Medical History:  Diagnosis Date  . Depression   . GERD (gastroesophageal reflux disease)   . Hyperlipidemia   . Hyperlipidemia, unspecified 01/01/2014  . Hypothyroidism   . Insomnia   . Musculoskeletal chest pain 09/27/2016    Surgical History: Past Surgical History:  Procedure Laterality Date  . ABDOMINAL HYSTERECTOMY    . BREAST BIOPSY Bilateral 90's   benign  . cataracts    . COLON SURGERY     partial colectomy  . intestinal blockage    . NISSEN FUNDOPLICATION      Home Medications:  Allergies as of 08/30/2020      Reactions   Gabapentin Other (See Comments)   Dizziness    Sulfa Antibiotics Other (See Comments)   Sulfonamide Derivatives       Medication List       Accurate as of August 30, 2020 11:59 PM. If you have any questions, ask your nurse or doctor.        acetaminophen 500 MG tablet Commonly known as: TYLENOL Take 1,000 mg by mouth every 6 (six) hours as needed.   aspirin 81 MG chewable tablet Chew 81 mg by mouth daily.   clopidogrel 75 MG tablet Commonly known as: PLAVIX Take 1 tablet by mouth once daily   furosemide 40 MG tablet Commonly known as: LASIX Take 1 tablet (40 mg total) by  mouth daily.   gabapentin 100 MG capsule Commonly known as: NEURONTIN Take 1 capsule (100 mg total) by mouth 3 (three) times daily.   levothyroxine 125 MCG tablet Commonly known as: SYNTHROID Take 1 tablet by mouth once daily   meloxicam 7.5 MG tablet Commonly known as: MOBIC Take 1 tablet by mouth once daily   omeprazole 40 MG capsule Commonly known as: PRILOSEC Take 1 capsule by mouth twice daily   pravastatin 40 MG tablet Commonly known as: PRAVACHOL TAKE 1 TABLET BY MOUTH AT BEDTIME       Allergies:  Allergies  Allergen Reactions  . Gabapentin Other (See Comments)    Dizziness   . Sulfa Antibiotics Other (See Comments)  . Sulfonamide Derivatives     Family History: Family History  Problem Relation Age of Onset  . Cervical cancer Mother   . Breast cancer Maternal Aunt     Social History:  reports that she has never smoked. She has never used smokeless tobacco. She reports that she does not drink alcohol and does not use drugs.  ROS: Pertinent ROS in HPI  Physical Exam: BP 100/68   Pulse 88   Ht 5\' 5"  (1.651 m)   Wt 150 lb (68 kg)   BMI 24.96 kg/m   Constitutional:  Well nourished. Alert  and oriented, No acute distress. HEENT: [place] AT, mask in place.  Trachea midline Cardiovascular: No clubbing, cyanosis, or edema. Respiratory: Normal respiratory effort, no increased work of breathing. Neurologic: Grossly intact, no focal deficits, moving all 4 extremities. Psychiatric: Normal mood and affect.  Laboratory Data: Lab Results  Component Value Date   WBC 11.2 (H) 04/26/2020   HGB 13.3 04/26/2020   HCT 39.9 04/26/2020   MCV 90.3 04/26/2020   PLT 249 04/26/2020    Lab Results  Component Value Date   CREATININE 0.76 04/26/2020    Lab Results  Component Value Date   TSH 0.11 (L) 04/26/2020       Component Value Date/Time   CHOL 182 04/26/2020 1606   HDL 71 04/26/2020 1606   CHOLHDL 2.6 04/26/2020 1606   LDLCALC 92 04/26/2020 1606    Lab  Results  Component Value Date   AST 15 04/26/2020   Lab Results  Component Value Date   ALT 8 04/26/2020    Urinalysis Component     Latest Ref Rng & Units 06/16/2020  Specific Gravity, UA     1.005 - 1.030 1.015  pH, UA     5.0 - 7.5 6.0  Color, UA     Yellow Yellow  Appearance Ur     Clear Clear  Leukocytes,UA     Negative Negative  Protein,UA     Negative/Trace Negative  Glucose, UA     Negative Negative  Ketones, UA     Negative Negative  RBC, UA     Negative Negative  Bilirubin, UA     Negative Negative  Urobilinogen, Ur     0.2 - 1.0 mg/dL 0.2  Nitrite, UA     Negative Negative  Microscopic Examination      See below:   Component     Latest Ref Rng & Units 06/16/2020  WBC, UA     0 - 5 /hpf 0-5  RBC     0 - 2 /hpf 0-2  Epithelial Cells (non renal)     0 - 10 /hpf 0-10  Bacteria, UA     None seen/Few None seen  I have reviewed the labs.   Pertinent Imaging: No recent imaging  Assessment & Plan:    1. Nocturia - continue Myrbetriq 25 mg daily - will attempt to apply for a tier reduction/formulary exception so she can continue the medication  2. OAB - see above    No follow-ups on file.  These notes generated with voice recognition software. I apologize for typographical errors.  Michiel Cowboy, PA-C  [facility] [address]  [address] [place], [place] [address] [phone]

## 2020-08-30 ENCOUNTER — Ambulatory Visit: Payer: Medicare Other | Admitting: Urology

## 2020-08-30 ENCOUNTER — Encounter: Payer: Self-pay | Admitting: Urology

## 2020-08-30 ENCOUNTER — Telehealth: Payer: Self-pay | Admitting: Urology

## 2020-08-30 ENCOUNTER — Other Ambulatory Visit: Payer: Self-pay

## 2020-08-30 ENCOUNTER — Ambulatory Visit: Payer: Self-pay | Admitting: Urology

## 2020-08-30 VITALS — BP 100/68 | HR 88 | Ht 65.0 in | Wt 150.0 lb

## 2020-08-30 DIAGNOSIS — R351 Nocturia: Secondary | ICD-10-CM | POA: Diagnosis not present

## 2020-08-30 DIAGNOSIS — N3281 Overactive bladder: Secondary | ICD-10-CM | POA: Diagnosis not present

## 2020-08-30 MED ORDER — MIRABEGRON ER 25 MG PO TB24: 25.0000 mg | ORAL_TABLET | Freq: Every day | ORAL | 3 refills | Status: DC

## 2020-08-30 NOTE — Telephone Encounter (Signed)
Would you see if we can get a tier exception for her Myrbetriq?

## 2020-09-01 ENCOUNTER — Other Ambulatory Visit: Payer: Self-pay

## 2020-09-01 ENCOUNTER — Ambulatory Visit (INDEPENDENT_AMBULATORY_CARE_PROVIDER_SITE_OTHER): Payer: Medicare Other | Admitting: Internal Medicine

## 2020-09-01 ENCOUNTER — Encounter: Payer: Self-pay | Admitting: Internal Medicine

## 2020-09-01 VITALS — BP 120/64 | HR 75 | Wt 160.2 lb

## 2020-09-01 DIAGNOSIS — J441 Chronic obstructive pulmonary disease with (acute) exacerbation: Secondary | ICD-10-CM

## 2020-09-01 DIAGNOSIS — M25511 Pain in right shoulder: Secondary | ICD-10-CM | POA: Insufficient documentation

## 2020-09-01 DIAGNOSIS — M67912 Unspecified disorder of synovium and tendon, left shoulder: Secondary | ICD-10-CM

## 2020-09-01 DIAGNOSIS — M170 Bilateral primary osteoarthritis of knee: Secondary | ICD-10-CM | POA: Diagnosis not present

## 2020-09-01 DIAGNOSIS — K219 Gastro-esophageal reflux disease without esophagitis: Secondary | ICD-10-CM

## 2020-09-01 NOTE — Assessment & Plan Note (Signed)
Patient was injected with cortisone and lidocaine shot in the left shoulder she tolerated the procedure well.  She was advised to wait in the waiting room for 5 minutes.

## 2020-09-01 NOTE — Assessment & Plan Note (Signed)
Patient complaining of chest pain on the left side of the chest.  Also complains of pain in the left shoulder.  We will inject the left shoulder lidocaine and steroid.  And get a chest x-ray to evaluate the wound on the left side of the chest.

## 2020-09-01 NOTE — Assessment & Plan Note (Signed)
-   The patient's GERD is stable on medication.  - Instructed the patient to avoid eating spicy and acidic foods, as well as foods high in fat. - Instructed the patient to avoid eating large meals or meals 2-3 hours prior to sleeping. 

## 2020-09-01 NOTE — Progress Notes (Signed)
Established Patient Office Visit  Subjective:  Patient ID: Abigail Strong, female    DOB: 04/22/1930  Age: 87 y.o. MRN: [id number]  CC:  Chief Complaint  Patient presents with  . pain    Left shoulder pain and breast pain    HPI  Abigail Strong presents for chest pain  On lt side and also   Pain in lt shoulder  Past Medical History:  Diagnosis Date  . Depression   . GERD (gastroesophageal reflux disease)   . Hyperlipidemia   . Hyperlipidemia, unspecified 01/01/2014  . Hypothyroidism   . Insomnia   . Musculoskeletal chest pain 09/27/2016    Past Surgical History:  Procedure Laterality Date  . ABDOMINAL HYSTERECTOMY    . BREAST BIOPSY Bilateral 90's   benign  . cataracts    . COLON SURGERY     partial colectomy  . intestinal blockage    . NISSEN FUNDOPLICATION      Family History  Problem Relation Age of Onset  . Cervical cancer Mother   . Breast cancer Maternal Aunt     Social History   Socioeconomic History  . Marital status: Married    Spouse name: Not on file  . Number of children: Not on file  . Years of education: Not on file  . Highest education level: Not on file  Occupational History  . Not on file  Tobacco Use  . Smoking status: Never Smoker  . Smokeless tobacco: Never Used  Substance and Sexual Activity  . Alcohol use: No  . Drug use: No  . Sexual activity: Not Currently    Birth control/protection: Post-menopausal  Other Topics Concern  . Not on file  Social History Narrative  . Not on file   Social Determinants of Health   Financial Resource Strain: Not on file  Food Insecurity: Not on file  Transportation Needs: Not on file  Physical Activity: Not on file  Stress: Not on file  Social Connections: Not on file  Intimate Partner Violence: Not on file     Current Outpatient Medications:  .  acetaminophen (TYLENOL) 500 MG tablet, Take 1,000 mg by mouth every 6 (six) hours as needed., Disp: , Rfl:  .  aspirin 81 MG chewable  tablet, Chew 81 mg by mouth daily., Disp: , Rfl:  .  clopidogrel (PLAVIX) 75 MG tablet, Take 1 tablet by mouth once daily, Disp: 90 tablet, Rfl: 0 .  furosemide (LASIX) 40 MG tablet, Take 1 tablet (40 mg total) by mouth daily., Disp: 30 tablet, Rfl: 6 .  gabapentin (NEURONTIN) 100 MG capsule, Take 1 capsule (100 mg total) by mouth 3 (three) times daily., Disp: 90 capsule, Rfl: 3 .  levothyroxine (SYNTHROID) 125 MCG tablet, Take 1 tablet by mouth once daily, Disp: 90 tablet, Rfl: 0 .  meloxicam (MOBIC) 7.5 MG tablet, Take 1 tablet by mouth once daily, Disp: 90 tablet, Rfl: 0 .  omeprazole (PRILOSEC) 40 MG capsule, Take 1 capsule by mouth twice daily, Disp: 180 capsule, Rfl: 0 .  pravastatin (PRAVACHOL) 40 MG tablet, TAKE 1 TABLET BY MOUTH AT BEDTIME, Disp: 90 tablet, Rfl: 0   Allergies  Allergen Reactions  . Gabapentin Other (See Comments)    Dizziness   . Sulfa Antibiotics Other (See Comments)  . Sulfonamide Derivatives     ROS Review of Systems  Constitutional: Negative.   HENT: Negative.   Eyes: Negative.   Respiratory: Positive for cough, chest tightness, shortness of breath and wheezing.  Cardiovascular: Positive for chest pain.  Gastrointestinal: Negative.   Endocrine: Negative.   Genitourinary: Negative.   Musculoskeletal: Positive for arthralgias.       Lt shoulder pain  Skin: Negative.   Allergic/Immunologic: Negative.   Neurological: Negative.   Hematological: Negative.   Psychiatric/Behavioral: Negative.   All other systems reviewed and are negative.     Objective:    Physical Exam Vitals reviewed.  Constitutional:      Appearance: Normal appearance.  HENT:     Mouth/Throat:     Mouth: Mucous membranes are moist.  Eyes:     Pupils: Pupils are equal, round, and reactive to light.  Neck:     Vascular: No carotid bruit.  Cardiovascular:     Rate and Rhythm: Normal rate and regular rhythm.     Pulses: Normal pulses.     Heart sounds: Normal heart sounds.   Pulmonary:     Effort: Pulmonary effort is normal.     Breath sounds: Wheezing and rales present.     Comments: Lt side of chest Abdominal:     General: Bowel sounds are normal.     Palpations: Abdomen is soft. There is no hepatomegaly, splenomegaly or mass.     Tenderness: There is no abdominal tenderness.     Hernia: No hernia is present.  Musculoskeletal:        General: No tenderness.     Cervical back: Neck supple.     Right lower leg: No edema.     Left lower leg: No edema.     Comments: Lt shoulder pain  Skin:    Findings: No rash.  Neurological:     Mental Status: She is alert and oriented to person, place, and time.     Motor: No weakness.  Psychiatric:        Mood and Affect: Mood and affect normal.        Behavior: Behavior normal.     BP 120/64   Pulse 75   Wt 160 lb 3.2 oz (72.7 kg)   SpO2 99%   BMI 26.66 kg/m  Wt Readings from Last 3 Encounters:  09/01/20 160 lb 3.2 oz (72.7 kg)  08/30/20 150 lb (68 kg)  07/28/20 158 lb 4.8 oz (71.8 kg)     Health Maintenance Due  Topic Date Due  . COVID-19 Vaccine (1) Never done  . TETANUS/TDAP  Never done  . DEXA SCAN  Never done  . PNA vac Low Risk Adult (1 of 2 - PCV13) Never done    There are no preventive care reminders to display for this patient.  Lab Results  Component Value Date   TSH 0.11 (L) 04/26/2020   Lab Results  Component Value Date   WBC 11.2 (H) 04/26/2020   HGB 13.3 04/26/2020   HCT 39.9 04/26/2020   MCV 90.3 04/26/2020   PLT 249 04/26/2020   Lab Results  Component Value Date   NA 140 04/26/2020   K 4.4 04/26/2020   CO2 27 04/26/2020   GLUCOSE 81 04/26/2020   BUN 26 (H) 04/26/2020   CREATININE 0.76 04/26/2020   BILITOT 0.6 04/26/2020   ALKPHOS 52 04/24/2019   AST 15 04/26/2020   ALT 8 04/26/2020   PROT 6.4 04/26/2020   ALBUMIN 4.1 04/24/2019   CALCIUM 9.6 04/26/2020   ANIONGAP 11 04/24/2019   Lab Results  Component Value Date   CHOL 182 04/26/2020   Lab Results   Component Value Date   HDL 71  04/26/2020   Lab Results  Component Value Date   LDLCALC 92 04/26/2020   Lab Results  Component Value Date   TRIG 97 04/26/2020   Lab Results  Component Value Date   CHOLHDL 2.6 04/26/2020   Lab Results  Component Value Date   HGBA1C 6.3 (H) 09/24/2016      Assessment & Plan:   Problem List Items Addressed This Visit      Respiratory   COPD exacerbation (HCC) - Primary    Patient complaining of chest pain on the left side of the chest.  Also complains of pain in the left shoulder.  We will inject the left shoulder lidocaine and steroid.  And get a chest x-ray to evaluate the wound on the left side of the chest.      Relevant Orders   Ambulatory referral to Interventional Radiology   DG Chest 2 View     Digestive   GERD (gastroesophageal reflux disease)    - The patient's GERD is stable on medication.  - Instructed the patient to avoid eating spicy and acidic foods, as well as foods high in fat. - Instructed the patient to avoid eating large meals or meals 2-3 hours prior to sleeping.         Musculoskeletal and Integument   Osteoarthritis of both knees   Disorder of rotator cuff, left    Patient was injected with cortisone and lidocaine shot in the left shoulder she tolerated the procedure well.  She was advised to wait in the waiting room for 5 minutes.        Joint Injection/Arthrocentesis  Date/Time: 09/01/2020 3:11 PM Performed by: Corky Downs, MD Authorized by: Corky Downs, MD  Indications: pain  Body area: shoulder Joint: left shoulder Local anesthesia used: yes  Anesthesia: Local anesthesia used: yes Local Anesthetic: lidocaine 1% without epinephrine  Sedation: Patient sedated: no  Needle size: 22 G Ultrasound guidance: no Approach: posterior Aspirate: clear Triamcinolone amount: 40 mg Lidocaine 1% amount: 1 mL Comments: Under aseptic condition left shoulder was anesthetized, 1% Xylocaine was injected  in the left shoulder, 40 mg of Kenalog Kenalog was injected in the left shoulder patient tolerated the procedure well.      No orders of the defined types were placed in this encounter.   Follow-up: No follow-ups on file.    Corky Downs, MD

## 2020-09-02 ENCOUNTER — Ambulatory Visit
Admission: RE | Admit: 2020-09-02 | Discharge: 2020-09-02 | Disposition: A | Discharge status: Home / Self Care | Payer: Medicare Other | Source: Ambulatory Visit | Attending: Internal Medicine | Admitting: Internal Medicine

## 2020-09-02 ENCOUNTER — Ambulatory Visit
Admission: RE | Admit: 2020-09-02 | Discharge: 2020-09-02 | Disposition: A | Discharge status: Home / Self Care | Payer: Medicare Other | Attending: Internal Medicine | Admitting: Internal Medicine

## 2020-09-02 DIAGNOSIS — R079 Chest pain, unspecified: Secondary | ICD-10-CM | POA: Diagnosis not present

## 2020-09-02 DIAGNOSIS — K449 Diaphragmatic hernia without obstruction or gangrene: Secondary | ICD-10-CM | POA: Diagnosis not present

## 2020-09-02 DIAGNOSIS — J441 Chronic obstructive pulmonary disease with (acute) exacerbation: Secondary | ICD-10-CM | POA: Insufficient documentation

## 2020-09-02 NOTE — Telephone Encounter (Signed)
Tier exception was denied.    

## 2020-09-05 ENCOUNTER — Other Ambulatory Visit: Payer: Self-pay | Admitting: Urology

## 2020-09-05 ENCOUNTER — Other Ambulatory Visit: Payer: Self-pay | Admitting: Internal Medicine

## 2020-09-05 NOTE — Telephone Encounter (Signed)
Can we see if her insurance will cover PTNS?

## 2020-09-06 ENCOUNTER — Ambulatory Visit (INDEPENDENT_AMBULATORY_CARE_PROVIDER_SITE_OTHER): Payer: Medicare Other | Admitting: Vascular Surgery

## 2020-09-06 NOTE — Telephone Encounter (Signed)
Talked with Mrs. Nieman about PTNS and she don't want to come for 12 weeks. She states she will call if she changes her mind.

## 2020-09-12 ENCOUNTER — Ambulatory Visit: Payer: Medicare Other | Admitting: Internal Medicine

## 2020-09-15 ENCOUNTER — Other Ambulatory Visit: Payer: Self-pay

## 2020-09-15 ENCOUNTER — Ambulatory Visit (INDEPENDENT_AMBULATORY_CARE_PROVIDER_SITE_OTHER): Payer: Medicare Other | Admitting: Family Medicine

## 2020-09-15 VITALS — BP 148/98 | HR 88 | Ht 65.0 in | Wt 154.6 lb

## 2020-09-15 DIAGNOSIS — M25511 Pain in right shoulder: Secondary | ICD-10-CM

## 2020-10-04 ENCOUNTER — Ambulatory Visit (INDEPENDENT_AMBULATORY_CARE_PROVIDER_SITE_OTHER): Payer: Medicare Other | Admitting: Vascular Surgery

## 2020-10-06 ENCOUNTER — Other Ambulatory Visit: Payer: Self-pay | Admitting: Internal Medicine

## 2020-10-13 ENCOUNTER — Other Ambulatory Visit: Payer: Self-pay | Admitting: *Deleted

## 2020-10-13 MED ORDER — AZITHROMYCIN 1 % OP SOLN: 1.0000 [drp] | Freq: Every day | OPHTHALMIC | 6 refills | Status: DC

## 2020-10-13 MED ORDER — CIPROFLOXACIN HCL 0.3 % OP SOLN: 1.0000 [drp] | Freq: Every day | OPHTHALMIC | 6 refills | Status: DC

## 2020-10-27 ENCOUNTER — Encounter: Payer: Self-pay | Admitting: Family Medicine

## 2020-10-27 ENCOUNTER — Ambulatory Visit (INDEPENDENT_AMBULATORY_CARE_PROVIDER_SITE_OTHER): Payer: Medicare Other | Admitting: Family Medicine

## 2020-10-27 ENCOUNTER — Other Ambulatory Visit: Payer: Self-pay

## 2020-10-27 VITALS — BP 132/84 | HR 86 | Ht 65.0 in | Wt 154.0 lb

## 2020-10-27 DIAGNOSIS — N644 Mastodynia: Secondary | ICD-10-CM

## 2020-11-06 ENCOUNTER — Other Ambulatory Visit: Payer: Self-pay | Admitting: Internal Medicine

## 2020-11-08 ENCOUNTER — Ambulatory Visit: Payer: Medicare Other | Admitting: General Surgery

## 2020-11-17 ENCOUNTER — Ambulatory Visit: Payer: Medicare Other | Admitting: General Surgery

## 2020-11-17 ENCOUNTER — Other Ambulatory Visit: Payer: Self-pay

## 2020-11-17 ENCOUNTER — Encounter: Payer: Self-pay | Admitting: General Surgery

## 2020-11-17 VITALS — BP 106/71 | HR 99 | Temp 98.8°F | Ht 65.0 in | Wt 156.0 lb

## 2020-11-17 DIAGNOSIS — R0789 Other chest pain: Secondary | ICD-10-CM | POA: Diagnosis not present

## 2020-11-17 DIAGNOSIS — N644 Mastodynia: Secondary | ICD-10-CM | POA: Insufficient documentation

## 2020-11-17 NOTE — Progress Notes (Signed)
Established Patient Office Visit  SUBJECTIVE:  Subjective  Patient ID: Abigail Strong, female    DOB: [birth year]  Age: 87 y.o. MRN: [id number]  CC:  Chief Complaint  Patient presents with  . left breast pain    Patient has had left breast pain for several months and has had mammogram that was normal     HPI Abigail Strong is a 87 y.o. female presenting today for     Past Medical History:  Diagnosis Date  . Depression   . GERD (gastroesophageal reflux disease)   . Hyperlipidemia   . Hyperlipidemia, unspecified 01/01/2014  . Hypothyroidism   . Insomnia   . Musculoskeletal chest pain 09/27/2016    Past Surgical History:  Procedure Laterality Date  . ABDOMINAL HYSTERECTOMY    . BREAST BIOPSY Bilateral 90's   benign  . cataracts    . COLON SURGERY     partial colectomy  . intestinal blockage    . NISSEN FUNDOPLICATION      Family History  Problem Relation Age of Onset  . Cervical cancer Mother   . Breast cancer Maternal Aunt     Social History   Socioeconomic History  . Marital status: Married    Spouse name: Not on file  . Number of children: Not on file  . Years of education: Not on file  . Highest education level: Not on file  Occupational History  . Not on file  Tobacco Use  . Smoking status: Never Smoker  . Smokeless tobacco: Never Used  Substance and Sexual Activity  . Alcohol use: No  . Drug use: No  . Sexual activity: Not Currently    Birth control/protection: Post-menopausal  Other Topics Concern  . Not on file  Social History Narrative  . Not on file   Social Determinants of Health   Financial Resource Strain: Not on file  Food Insecurity: Not on file  Transportation Needs: Not on file  Physical Activity: Not on file  Stress: Not on file  Social Connections: Not on file  Intimate Partner Violence: Not on file     Current Outpatient Medications:  .  acetaminophen (TYLENOL) 500 MG tablet, Take 1,000 mg by mouth every 6 (six)  hours as needed., Disp: , Rfl:  .  albuterol (PROVENTIL) (2.5 MG/3ML) 0.083% nebulizer solution, USE 1 VIAL IN NEBULIZER ONCE DAILY AS NEEDED, Disp: 75 mL, Rfl: 0 .  aspirin 81 MG chewable tablet, Chew 81 mg by mouth daily., Disp: , Rfl:  .  ciprofloxacin (CILOXAN) 0.3 % ophthalmic solution, Place 1 drop into both eyes daily at 2 PM. Administer 1 drop, every 2 hours, while awake, for 2 days. Then 1 drop, every 4 hours, while awake, for the next 5 days., Disp: 5 mL, Rfl: 6 .  clopidogrel (PLAVIX) 75 MG tablet, Take 1 tablet by mouth once daily, Disp: 90 tablet, Rfl: 0 .  furosemide (LASIX) 40 MG tablet, Take 1 tablet (40 mg total) by mouth daily., Disp: 30 tablet, Rfl: 6 .  gabapentin (NEURONTIN) 100 MG capsule, Take 1 capsule (100 mg total) by mouth 3 (three) times daily., Disp: 90 capsule, Rfl: 3 .  levothyroxine (SYNTHROID) 125 MCG tablet, Take 1 tablet by mouth once daily, Disp: 90 tablet, Rfl: 0 .  omeprazole (PRILOSEC) 40 MG capsule, Take 1 capsule by mouth twice daily, Disp: 180 capsule, Rfl: 0 .  pravastatin (PRAVACHOL) 40 MG tablet, TAKE 1 TABLET BY MOUTH AT BEDTIME, Disp: 90 tablet, Rfl: 0 .  meloxicam (MOBIC) 7.5 MG tablet, Take 1 tablet by mouth once daily, Disp: 90 tablet, Rfl: 0   Allergies  Allergen Reactions  . Gabapentin Other (See Comments)    Dizziness   . Sulfa Antibiotics Other (See Comments)  . Sulfonamide Derivatives     ROS Review of Systems  Constitutional: Negative.   HENT: Negative.   Respiratory: Negative.   Cardiovascular: Negative.   Gastrointestinal: Negative.   Skin: Negative.   Neurological: Negative.   Psychiatric/Behavioral: Negative.      OBJECTIVE:    Physical Exam Vitals and nursing note reviewed.  HENT:     Right Ear: Tympanic membrane normal.  Cardiovascular:     Rate and Rhythm: Normal rate and regular rhythm.  Pulmonary:     Effort: Pulmonary effort is normal.  Musculoskeletal:        General: Normal range of motion.  Skin:     General: Skin is warm.     Comments: No masses left breast mild tenderness.   Psychiatric:        Mood and Affect: Mood normal.     BP 132/84   Pulse 86   Ht 5\' 5"  (1.651 m)   Wt 154 lb (69.9 kg)   BMI 25.63 kg/m  Wt Readings from Last 3 Encounters:  10/27/20 154 lb (69.9 kg)  09/15/20 154 lb 9.6 oz (70.1 kg)  09/01/20 160 lb 3.2 oz (72.7 kg)    Health Maintenance Due  Topic Date Due  . COVID-19 Vaccine (1) Never done  . TETANUS/TDAP  Never done  . DEXA SCAN  Never done  . PNA vac Low Risk Adult (1 of 2 - PCV13) Never done    There are no preventive care reminders to display for this patient.  CBC Latest Ref Rng & Units 04/26/2020 04/24/2019 09/29/2018  WBC 3.8 - 10.8 Thousand/uL 11.2(H) 11.1(H) 5.6  Hemoglobin 11.7 - 15.5 g/dL 46.9 62.9 10.9(L)  Hematocrit 35.0 - 45.0 % 39.9 39.4 34.5(L)  Platelets 140 - 400 Thousand/uL 249 241 173   CMP Latest Ref Rng & Units 04/26/2020 04/24/2019 09/30/2018  Glucose 65 - 99 mg/dL 81 95 93  BUN 7 - 25 mg/dL 52(W) 41(L) 9  Creatinine 0.60 - 0.88 mg/dL 2.44 0.10 2.72  Sodium 135 - 146 mmol/L 140 141 141  Potassium 3.5 - 5.3 mmol/L 4.4 3.7 4.0  Chloride 98 - 110 mmol/L 103 104 109  CO2 20 - 32 mmol/L 27 26 29   Calcium 8.6 - 10.4 mg/dL 9.6 9.8 5.3(G)  Total Protein 6.1 - 8.1 g/dL 6.4 6.8 -  Total Bilirubin 0.2 - 1.2 mg/dL 0.6 0.7 -  Alkaline Phos 38 - 126 U/L - 52 -  AST 10 - 35 U/L 15 19 -  ALT 6 - 29 U/L 8 12 -    Lab Results  Component Value Date   TSH 0.11 (L) 04/26/2020   Lab Results  Component Value Date   ALBUMIN 4.1 04/24/2019   ANIONGAP 11 04/24/2019   Lab Results  Component Value Date   CHOL 182 04/26/2020   HDL 71 04/26/2020   LDLCALC 92 04/26/2020   CHOLHDL 2.6 04/26/2020   Lab Results  Component Value Date   TRIG 97 04/26/2020   Lab Results  Component Value Date   HGBA1C 6.3 (H) 09/24/2016   HGBA1C 5.8 03/23/2015      ASSESSMENT & PLAN:   Problem List Items Addressed This Visit      Other   Breast  pain, left -  Primary    Patient with continued left breast pain, Mammo with no acute findings. Will send for general surgery consult.       Relevant Orders   Ambulatory referral to General Surgery      No orders of the defined types were placed in this encounter.     Follow-up: No follow-ups on file.    Irish Lack, FNP [facility] [address] [place], [place] [address]

## 2020-11-17 NOTE — Patient Instructions (Signed)
You may need to speak with Orthopedics about your chest wall pain. We will send Dr Abigail Strong our notes.    Chest Wall Pain Chest wall pain is pain in or around the bones and muscles of your chest. Chest wall pain may be caused by:  An injury.  Coughing a lot.  Using your chest and arm muscles too much. Sometimes, the cause may not be known. This pain may take a few weeks or longer to get better. Follow these instructions at home: Managing pain, stiffness, and swelling If told, put ice on the painful area:  Put ice in a plastic bag.  Place a towel between your skin and the bag.  Leave the ice on for 20 minutes, 2-3 times a day.   Activity  Rest as told by your doctor.  Avoid doing things that cause pain. This includes lifting heavy items.  Ask your doctor what activities are safe for you. General instructions  Take over-the-counter and prescription medicines only as told by your doctor.  Do not use any products that contain nicotine or tobacco, such as cigarettes, e-cigarettes, and chewing tobacco. If you need help quitting, ask your doctor.  Keep all follow-up visits as told by your doctor. This is important.   Contact a doctor if:  You have a fever.  Your chest pain gets worse.  You have new symptoms. Get help right away if:  You feel sick to your stomach (nauseous) or you throw up (vomit).  You feel sweaty or light-headed.  You have a cough with mucus from your lungs (sputum) or you cough up blood.  You are short of breath. These symptoms may be an emergency. Do not wait to see if the symptoms will go away. Get medical help right away. Call your local emergency services (911 in the [place] Do not drive yourself to the hospital. Summary  Chest wall pain is pain in or around the bones and muscles of your chest.  It may be treated with ice, rest, and medicines. Your condition may also get better if you avoid doing things that cause pain.  Contact a doctor if you  have a fever, chest pain that gets worse, or new symptoms.  Get help right away if you feel light-headed or you get short of breath. These symptoms may be an emergency. This information is not intended to replace advice given to you by your health care provider. Make sure you discuss any questions you have with your health care provider. Document Revised: 02/06/2018 Document Reviewed: 02/06/2018 Elsevier Patient Education  2021 [employer].

## 2020-11-17 NOTE — Progress Notes (Signed)
Patient ID: Abigail Strong, female   DOB: 08-10-1930, 87 y.o.   MRN: [id number]  No chief complaint on file.   HPI Abigail Strong is a 87 y.o. female.  She has been referred by her primary care provider for evaluation of left breast pain.  Abigail Strong states that she has had pain in the left side of her chest for about the last year.  She had a normal mammogram in June 2021.  No additional imaging has been performed since that time.  She states that she has a "knot" underneath her breast against her chest wall.  She says that she found it herself and that it is locally tender.  She has a prior history of bilateral breast cysts that were excised and benign.  She has had a hysterectomy and denies any family history of breast cancer.  Onset of menses was age 18.  She had 4 pregnancies, the first at the age of 64.  She did not breast-feed her children.  She did not take birth control or hormone replacement therapy.  She does not t have any nipple discharge nor any skin changes.  In talking to her today, her primary complaint seems to be actually related to her left shoulder.  She endorses significant pain and difficulty with moving that arm, secondary to pain and stiffness in that shoulder.  She did have a prior shoulder injection that seemed to help, but she states that she is unable to lie down on her side or rest well due to the discomfort she is experiencing.   Past Medical History:  Diagnosis Date  . Depression   . GERD (gastroesophageal reflux disease)   . Hyperlipidemia   . Hyperlipidemia, unspecified 01/01/2014  . Hypothyroidism   . Insomnia   . Musculoskeletal chest pain 09/27/2016    Past Surgical History:  Procedure Laterality Date  . ABDOMINAL HYSTERECTOMY    . BREAST BIOPSY Bilateral 90's   benign  . cataracts    . COLON SURGERY     partial colectomy  . intestinal blockage    . NISSEN FUNDOPLICATION      Family History  Problem Relation Age of Onset  . Cervical cancer  Mother   . Breast cancer Maternal Aunt     Social History Social History   Tobacco Use  . Smoking status: Never Smoker  . Smokeless tobacco: Never Used  Substance Use Topics  . Alcohol use: No  . Drug use: No    Allergies  Allergen Reactions  . Gabapentin Other (See Comments)    Dizziness   . Sulfa Antibiotics Other (See Comments)  . Sulfonamide Derivatives     Current Outpatient Medications  Medication Sig Dispense Refill  . acetaminophen (TYLENOL) 500 MG tablet Take 1,000 mg by mouth every 6 (six) hours as needed.    [name] Kitchen albuterol (PROVENTIL) (2.5 MG/3ML) 0.083% nebulizer solution USE 1 VIAL IN NEBULIZER ONCE DAILY AS NEEDED 75 mL 0  . aspirin 81 MG chewable tablet Chew 81 mg by mouth daily.    . ciprofloxacin (CILOXAN) 0.3 % ophthalmic solution Place 1 drop into both eyes daily at 2 PM. Administer 1 drop, every 2 hours, while awake, for 2 days. Then 1 drop, every 4 hours, while awake, for the next 5 days. 5 mL 6  . clopidogrel (PLAVIX) 75 MG tablet Take 1 tablet by mouth once daily 90 tablet 0  . furosemide (LASIX) 40 MG tablet Take 1 tablet (40 mg total) by mouth daily.  30 tablet 6  . gabapentin (NEURONTIN) 100 MG capsule Take 1 capsule (100 mg total) by mouth 3 (three) times daily. 90 capsule 3  . levothyroxine (SYNTHROID) 125 MCG tablet Take 1 tablet by mouth once daily 90 tablet 0  . meloxicam (MOBIC) 7.5 MG tablet Take 1 tablet by mouth once daily 90 tablet 0  . omeprazole (PRILOSEC) 40 MG capsule Take 1 capsule by mouth twice daily 180 capsule 0  . pravastatin (PRAVACHOL) 40 MG tablet TAKE 1 TABLET BY MOUTH AT BEDTIME 90 tablet 0   No current facility-administered medications for this visit.    Review of Systems Review of Systems  Constitutional: Positive for fatigue and unexpected weight change.  HENT: Positive for hearing loss.   Eyes: Positive for pain and visual disturbance.       Dry eyes  Respiratory: Positive for cough and shortness of breath.    Cardiovascular: Positive for leg swelling.       Orthopnea.  Claudication.  Gastrointestinal: Positive for abdominal pain and constipation.       GERD  Genitourinary: Positive for frequency and urgency.  Neurological: Positive for dizziness and headaches.  Psychiatric/Behavioral: Positive for dysphoric mood. The patient is nervous/anxious.   All other systems reviewed and are negative.   Blood pressure 106/71, pulse 99, temperature 98.8 F (37.1 C), height 5\' 5"  (1.651 m), weight 156 lb (70.8 kg), SpO2 98 %. Body mass index is 25.96 kg/m.  Physical Exam Physical Exam Constitutional:      General: She is not in acute distress.    Appearance: She is obese. She is ill-appearing.     Comments: Seated in a wheelchair.  Appears chronically ill.  HENT:     Head: Normocephalic and atraumatic.     Nose:     Comments: Covered with a mask    Mouth/Throat:     Comments: Covered with a mask Eyes:     General: No scleral icterus.       Right eye: No discharge.        Left eye: No discharge.     Comments: Arcus senilis  Neck:     Comments: The trachea is midline.  No palpable cervical or supraclavicular lymphadenopathy.  No thyromegaly or dominant thyroid masses appreciated.  The gland moves freely with deglutition. Cardiovascular:     Rate and Rhythm: Normal rate and regular rhythm.  Pulmonary:     Effort: Pulmonary effort is normal. No respiratory distress.  Chest:  Breasts:     Right: Normal. No axillary adenopathy or supraclavicular adenopathy.     Left: Normal. No axillary adenopathy or supraclavicular adenopathy.      Comments: No breast masses are appreciated.  The "knot" that she points out to me is actually her rib.  Pain is reproducible with palpation. Abdominal:     General: Bowel sounds are normal.     Palpations: Abdomen is soft.  Genitourinary:    Comments: Deferred Musculoskeletal:     Right lower leg: Edema present.     Left lower leg: Edema present.   Lymphadenopathy:     Upper Body:     Right upper body: No supraclavicular, axillary or pectoral adenopathy.     Left upper body: No supraclavicular, axillary or pectoral adenopathy.  Skin:    General: Skin is warm and dry.  Neurological:     General: No focal deficit present.     Mental Status: She is alert and oriented to person, place, and time.  Psychiatric:  Mood and Affect: Mood normal.        Behavior: Behavior normal.     Data Reviewed I reviewed the mammogram performed in June 2021, and can occur with the radiology impression that there is no evidence of malignant disease.  I also reviewed the chest x-ray that was performed in January 2022.  Notably, she has a large hiatal hernia and significant thoracic kyphosis.  No abnormality to explain her discomfort is identified.  This is in accordance with the radiology interpretation.  I reviewed clinic notes from Dr. Renie Ora office, including the most recent from Irish Lack, FNP.  This note discusses left breast pain and resulted in the referral to general surgery.  Assessment This is an 87 year old woman who has complained of left-sided chest pain for least a year.  Her mammogram performed in June 2021 was negative.  Her primary complaint actually seems related more to her left shoulder.  Physical examination demonstrates that her pain is not associated with the breast tissue at all, but rather with her chest wall.  She in fact carries a diagnosis in her problem list of musculoskeletal chest wall pain dating back to 2018.  This is most consistent with my examination and impression today as well.  Plan There is no role for general surgery in the management of musculoskeletal chest wall pain.  She does seem to have ongoing issues with her left shoulder and may benefit from orthopedic consultation.  No need for additional follow-up in our clinic at this time.    Duanne Guess 11/17/2020, 2:11 PM

## 2020-11-17 NOTE — Assessment & Plan Note (Addendum)
Patient with continued left breast pain, Mammo with no acute findings. Will send for general surgery consult.

## 2020-11-29 DIAGNOSIS — M19012 Primary osteoarthritis, left shoulder: Secondary | ICD-10-CM | POA: Diagnosis not present

## 2020-12-01 ENCOUNTER — Other Ambulatory Visit: Payer: Self-pay | Admitting: *Deleted

## 2020-12-01 MED ORDER — GENTAK 0.3 % OP OINT: TOPICAL_OINTMENT | Freq: Three times a day (TID) | OPHTHALMIC | 6 refills | Status: DC

## 2020-12-02 ENCOUNTER — Other Ambulatory Visit: Payer: Self-pay | Admitting: Internal Medicine

## 2020-12-08 DIAGNOSIS — H0015 Chalazion left lower eyelid: Secondary | ICD-10-CM | POA: Diagnosis not present

## 2020-12-16 ENCOUNTER — Other Ambulatory Visit: Payer: Self-pay

## 2020-12-16 ENCOUNTER — Other Ambulatory Visit: Payer: Self-pay | Admitting: Internal Medicine

## 2020-12-16 DIAGNOSIS — Z1231 Encounter for screening mammogram for malignant neoplasm of breast: Secondary | ICD-10-CM

## 2020-12-19 DIAGNOSIS — M170 Bilateral primary osteoarthritis of knee: Secondary | ICD-10-CM | POA: Diagnosis not present

## 2020-12-23 ENCOUNTER — Other Ambulatory Visit: Payer: Self-pay | Admitting: *Deleted

## 2020-12-23 DIAGNOSIS — N644 Mastodynia: Secondary | ICD-10-CM

## 2021-01-05 ENCOUNTER — Other Ambulatory Visit: Payer: Self-pay | Admitting: Family Medicine

## 2021-01-06 ENCOUNTER — Other Ambulatory Visit: Payer: Self-pay | Admitting: Family Medicine

## 2021-01-06 ENCOUNTER — Other Ambulatory Visit: Payer: Self-pay | Admitting: Internal Medicine

## 2021-01-09 ENCOUNTER — Ambulatory Visit
Admission: RE | Admit: 2021-01-09 | Discharge: 2021-01-09 | Disposition: A | Discharge status: Home / Self Care | Payer: Medicare Other | Source: Ambulatory Visit | Attending: Internal Medicine | Admitting: Internal Medicine

## 2021-01-09 ENCOUNTER — Other Ambulatory Visit: Payer: Self-pay

## 2021-01-09 DIAGNOSIS — R922 Inconclusive mammogram: Secondary | ICD-10-CM | POA: Diagnosis not present

## 2021-01-09 DIAGNOSIS — N644 Mastodynia: Secondary | ICD-10-CM

## 2021-01-10 ENCOUNTER — Ambulatory Visit: Payer: Medicare Other | Admitting: Internal Medicine

## 2021-01-18 ENCOUNTER — Other Ambulatory Visit: Payer: Self-pay | Admitting: Internal Medicine

## 2021-01-19 ENCOUNTER — Other Ambulatory Visit: Payer: Self-pay

## 2021-01-19 ENCOUNTER — Ambulatory Visit (INDEPENDENT_AMBULATORY_CARE_PROVIDER_SITE_OTHER): Payer: Medicare Other | Admitting: Family Medicine

## 2021-01-19 ENCOUNTER — Encounter: Payer: Self-pay | Admitting: Family Medicine

## 2021-01-19 VITALS — BP 126/64 | HR 43 | Ht 65.0 in | Wt 157.3 lb

## 2021-01-19 DIAGNOSIS — S80812A Abrasion, left lower leg, initial encounter: Secondary | ICD-10-CM

## 2021-01-19 DIAGNOSIS — I10 Essential (primary) hypertension: Secondary | ICD-10-CM

## 2021-01-19 DIAGNOSIS — K219 Gastro-esophageal reflux disease without esophagitis: Secondary | ICD-10-CM

## 2021-01-19 DIAGNOSIS — S81812A Laceration without foreign body, left lower leg, initial encounter: Secondary | ICD-10-CM | POA: Insufficient documentation

## 2021-01-19 DIAGNOSIS — R413 Other amnesia: Secondary | ICD-10-CM | POA: Diagnosis not present

## 2021-01-19 MED ORDER — DONEPEZIL HCL 5 MG PO TBDP: 5.0000 mg | ORAL_TABLET | Freq: Every day | ORAL | 2 refills | Status: DC

## 2021-01-19 MED ORDER — OMEPRAZOLE 40 MG PO CPDR: 1.0000 | DELAYED_RELEASE_CAPSULE | Freq: Two times a day (BID) | ORAL | 2 refills | Status: DC

## 2021-01-19 NOTE — Assessment & Plan Note (Signed)
Memory loss worsening over the last few months. She spoke with Dr Lavera Guise about starting Aricept and he said that if the problem continues we will give her the medication.   Plan- Start Aricept today.

## 2021-01-19 NOTE — Assessment & Plan Note (Signed)
Patient's blood pressure is within the desired range. Medication side effects include: no side effects noted Continue current treatment regimen.  

## 2021-01-19 NOTE — Assessment & Plan Note (Signed)
Patient reports that her reflux symptoms are doing well since starting medication.  Plan- Refilled meds today, continue GERD diet and therapy.

## 2021-01-19 NOTE — Progress Notes (Signed)
Established Patient Office Visit  SUBJECTIVE:  Subjective  Patient ID: Abigail Strong, female    DOB: 02-29-1930  Age: 87 y.o. MRN: [id number]  CC:  Chief Complaint  Patient presents with  . Abrasion    Left leg    HPI Abigail Strong is a 87 y.o. female presenting today for     Past Medical History:  Diagnosis Date  . Depression   . GERD (gastroesophageal reflux disease)   . Hyperlipidemia   . Hyperlipidemia, unspecified 01/01/2014  . Hypothyroidism   . Insomnia   . Musculoskeletal chest pain 09/27/2016    Past Surgical History:  Procedure Laterality Date  . ABDOMINAL HYSTERECTOMY    . BREAST BIOPSY Bilateral 90's   benign  . cataracts    . COLON SURGERY     partial colectomy  . intestinal blockage    . NISSEN FUNDOPLICATION      Family History  Problem Relation Age of Onset  . Cervical cancer Mother   . Breast cancer Maternal Aunt     Social History   Socioeconomic History  . Marital status: Married    Spouse name: Not on file  . Number of children: Not on file  . Years of education: Not on file  . Highest education level: Not on file  Occupational History  . Not on file  Tobacco Use  . Smoking status: Never Smoker  . Smokeless tobacco: Never Used  Substance and Sexual Activity  . Alcohol use: No  . Drug use: No  . Sexual activity: Not Currently    Birth control/protection: Post-menopausal  Other Topics Concern  . Not on file  Social History Narrative  . Not on file   Social Determinants of Health   Financial Resource Strain: Not on file  Food Insecurity: Not on file  Transportation Needs: Not on file  Physical Activity: Not on file  Stress: Not on file  Social Connections: Not on file  Intimate Partner Violence: Not on file     Current Outpatient Medications:  .  acetaminophen (TYLENOL) 500 MG tablet, Take 1,000 mg by mouth every 6 (six) hours as needed., Disp: , Rfl:  .  albuterol (PROVENTIL) (2.5 MG/3ML) 0.083% nebulizer  solution, USE 1 VIAL IN NEBULIZER ONCE DAILY AS NEEDED, Disp: 75 mL, Rfl: 0 .  aspirin 81 MG chewable tablet, Chew 81 mg by mouth daily., Disp: , Rfl:  .  ciprofloxacin (CILOXAN) 0.3 % ophthalmic solution, Place 1 drop into both eyes daily at 2 PM. Administer 1 drop, every 2 hours, while awake, for 2 days. Then 1 drop, every 4 hours, while awake, for the next 5 days., Disp: 5 mL, Rfl: 6 .  clopidogrel (PLAVIX) 75 MG tablet, Take 1 tablet by mouth once daily, Disp: 90 tablet, Rfl: 0 .  furosemide (LASIX) 40 MG tablet, Take 1 tablet (40 mg total) by mouth daily., Disp: 30 tablet, Rfl: 6 .  gabapentin (NEURONTIN) 100 MG capsule, Take 1 capsule (100 mg total) by mouth 3 (three) times daily., Disp: 90 capsule, Rfl: 3 .  gentamicin (GENTAK) 0.3 % ophthalmic ointment, Place into both eyes 3 (three) times daily., Disp: 3.5 g, Rfl: 6 .  levothyroxine (SYNTHROID) 125 MCG tablet, Take 1 tablet by mouth once daily, Disp: 90 tablet, Rfl: 0 .  meloxicam (MOBIC) 7.5 MG tablet, Take 1 tablet by mouth once daily, Disp: 90 tablet, Rfl: 0 .  omeprazole (PRILOSEC) 40 MG capsule, Take 1 capsule by mouth twice daily, Disp: 180  capsule, Rfl: 0 .  pravastatin (PRAVACHOL) 40 MG tablet, TAKE 1 TABLET BY MOUTH AT BEDTIME, Disp: 90 tablet, Rfl: 0   Allergies  Allergen Reactions  . Gabapentin Other (See Comments)    Dizziness   . Sulfa Antibiotics Other (See Comments)  . Sulfonamide Derivatives     ROS Review of Systems  Constitutional: Negative.   HENT: Negative.   Respiratory: Negative.   Cardiovascular: Negative.   Genitourinary: Negative.   Musculoskeletal: Positive for gait problem.  Psychiatric/Behavioral: Negative.      OBJECTIVE:    Physical Exam Vitals and nursing note reviewed.  Constitutional:      Appearance: She is obese.  HENT:     Right Ear: Tympanic membrane normal.     Left Ear: Tympanic membrane normal.  Cardiovascular:     Rate and Rhythm: Bradycardia present.  Musculoskeletal:         General: Normal range of motion.  Neurological:     General: No focal deficit present.  Psychiatric:        Mood and Affect: Mood normal.        Cognition and Memory: Memory is impaired.     BP 126/64   Pulse (!) 43   Ht 5\' 5"  (1.651 m)   Wt 157 lb 4.8 oz (71.4 kg)   BMI 26.18 kg/m  Wt Readings from Last 3 Encounters:  01/19/21 157 lb 4.8 oz (71.4 kg)  11/17/20 156 lb (70.8 kg)  10/27/20 154 lb (69.9 kg)    Health Maintenance Due  Topic Date Due  . COVID-19 Vaccine (1) Never done  . Zoster Vaccines- Shingrix (1 of 2) Never done  . DEXA SCAN  Never done  . PNA vac Low Risk Adult (1 of 2 - PCV13) Never done    There are no preventive care reminders to display for this patient.  CBC Latest Ref Rng & Units 04/26/2020 04/24/2019 09/29/2018  WBC 3.8 - 10.8 Thousand/uL 11.2(H) 11.1(H) 5.6  Hemoglobin 11.7 - 15.5 g/dL 16.1 09.6 10.9(L)  Hematocrit 35.0 - 45.0 % 39.9 39.4 34.5(L)  Platelets 140 - 400 Thousand/uL 249 241 173   CMP Latest Ref Rng & Units 04/26/2020 04/24/2019 09/30/2018  Glucose 65 - 99 mg/dL 81 95 93  BUN 7 - 25 mg/dL 04(V) 40(J) 9  Creatinine 0.60 - 0.88 mg/dL 8.11 9.14 7.82  Sodium 135 - 146 mmol/L 140 141 141  Potassium 3.5 - 5.3 mmol/L 4.4 3.7 4.0  Chloride 98 - 110 mmol/L 103 104 109  CO2 20 - 32 mmol/L 27 26 29   Calcium 8.6 - 10.4 mg/dL 9.6 9.8 9.5(A)  Total Protein 6.1 - 8.1 g/dL 6.4 6.8 -  Total Bilirubin 0.2 - 1.2 mg/dL 0.6 0.7 -  Alkaline Phos 38 - 126 U/L - 52 -  AST 10 - 35 U/L 15 19 -  ALT 6 - 29 U/L 8 12 -    Lab Results  Component Value Date   TSH 0.11 (L) 04/26/2020   Lab Results  Component Value Date   ALBUMIN 4.1 04/24/2019   ANIONGAP 11 04/24/2019   Lab Results  Component Value Date   CHOL 182 04/26/2020   HDL 71 04/26/2020   LDLCALC 92 04/26/2020   CHOLHDL 2.6 04/26/2020   Lab Results  Component Value Date   TRIG 97 04/26/2020   Lab Results  Component Value Date   HGBA1C 6.3 (H) 09/24/2016   HGBA1C 5.8 03/23/2015       ASSESSMENT & PLAN:  Problem List Items Addressed This Visit      Cardiovascular and Mediastinum   Essential hypertension    Patient's blood pressure is within the desired range. Medication side effects include: no side effects noted Continue current treatment regimen.         Digestive   GERD (gastroesophageal reflux disease)    Patient reports that her reflux symptoms are doing well since starting medication.  Plan- Refilled meds today, continue GERD diet and therapy.         Musculoskeletal and Integument   Skin tear of lower leg without complication, left, initial encounter    Patient closed a door on her lower leg today on the way to our appt today, small quarter size skin tear above left ankle.  Plan- TDAP given today. Bandage applied.         Other   Memory loss    Memory loss worsening over the last few months. She spoke with Dr Juel Burrow about starting Aricept and he said that if the problem continues we will give her the medication.   Plan- Start Aricept today.        Other Visit Diagnoses    Abrasion of left lower extremity, initial encounter    -  Primary   Relevant Orders   Tdap vaccine greater than or equal to 7yo IM (Completed)      No orders of the defined types were placed in this encounter.     Follow-up: No follow-ups on file.    Irish Lack, FNP [facility] [address], [place], [place] [address]

## 2021-01-19 NOTE — Assessment & Plan Note (Signed)
Patient closed a door on her lower leg today on the way to our appt today, small quarter size skin tear above left ankle.  Plan- TDAP given today. Bandage applied.

## 2021-01-23 ENCOUNTER — Other Ambulatory Visit: Payer: Self-pay

## 2021-01-23 ENCOUNTER — Encounter: Payer: Self-pay | Admitting: Internal Medicine

## 2021-01-23 ENCOUNTER — Ambulatory Visit (INDEPENDENT_AMBULATORY_CARE_PROVIDER_SITE_OTHER): Payer: Medicare Other | Admitting: Internal Medicine

## 2021-01-23 VITALS — BP 104/66 | HR 52 | Ht 65.0 in | Wt 157.0 lb

## 2021-01-23 DIAGNOSIS — I10 Essential (primary) hypertension: Secondary | ICD-10-CM | POA: Diagnosis not present

## 2021-01-23 DIAGNOSIS — R5382 Chronic fatigue, unspecified: Secondary | ICD-10-CM

## 2021-01-23 DIAGNOSIS — M47812 Spondylosis without myelopathy or radiculopathy, cervical region: Secondary | ICD-10-CM

## 2021-01-23 DIAGNOSIS — G8929 Other chronic pain: Secondary | ICD-10-CM

## 2021-01-23 DIAGNOSIS — M546 Pain in thoracic spine: Secondary | ICD-10-CM | POA: Diagnosis not present

## 2021-01-23 DIAGNOSIS — J441 Chronic obstructive pulmonary disease with (acute) exacerbation: Secondary | ICD-10-CM | POA: Diagnosis not present

## 2021-01-23 DIAGNOSIS — E039 Hypothyroidism, unspecified: Secondary | ICD-10-CM

## 2021-01-23 DIAGNOSIS — M792 Neuralgia and neuritis, unspecified: Secondary | ICD-10-CM

## 2021-01-23 DIAGNOSIS — R413 Other amnesia: Secondary | ICD-10-CM | POA: Diagnosis not present

## 2021-01-23 DIAGNOSIS — K219 Gastro-esophageal reflux disease without esophagitis: Secondary | ICD-10-CM | POA: Diagnosis not present

## 2021-01-23 MED ORDER — GENTAK 0.3 % OP OINT: TOPICAL_OINTMENT | Freq: Three times a day (TID) | OPHTHALMIC | 6 refills | Status: DC

## 2021-01-23 MED ORDER — PREGABALIN 50 MG PO CAPS: 50.0000 mg | ORAL_CAPSULE | Freq: Two times a day (BID) | ORAL | 2 refills | Status: DC

## 2021-01-23 NOTE — Assessment & Plan Note (Signed)
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

## 2021-01-23 NOTE — Assessment & Plan Note (Signed)
Chronic problem. 

## 2021-01-23 NOTE — Assessment & Plan Note (Signed)
Patient is a non-smoker.

## 2021-01-23 NOTE — Progress Notes (Signed)
Established Patient Office Visit  Subjective:  Patient ID: Abigail Strong, female    DOB: 1930/04/17  Age: 87 y.o. MRN: [id number]  CC:  Chief Complaint  Patient presents with  . Shortness of Breath    HPI  Abigail Strong presents for sob, c/o  Lt sided  Chest pain , lt arm weakness  Past Medical History:  Diagnosis Date  . Depression   . GERD (gastroesophageal reflux disease)   . Hyperlipidemia   . Hyperlipidemia, unspecified 01/01/2014  . Hypothyroidism   . Insomnia   . Musculoskeletal chest pain 09/27/2016    Past Surgical History:  Procedure Laterality Date  . ABDOMINAL HYSTERECTOMY    . BREAST BIOPSY Bilateral 90's   benign  . cataracts    . COLON SURGERY     partial colectomy  . intestinal blockage    . NISSEN FUNDOPLICATION      Family History  Problem Relation Age of Onset  . Cervical cancer Mother   . Breast cancer Maternal Aunt     Social History   Socioeconomic History  . Marital status: Married    Spouse name: Not on file  . Number of children: Not on file  . Years of education: Not on file  . Highest education level: Not on file  Occupational History  . Not on file  Tobacco Use  . Smoking status: Never Smoker  . Smokeless tobacco: Never Used  Substance and Sexual Activity  . Alcohol use: No  . Drug use: No  . Sexual activity: Not Currently    Birth control/protection: Post-menopausal  Other Topics Concern  . Not on file  Social History Narrative  . Not on file   Social Determinants of Health   Financial Resource Strain: Not on file  Food Insecurity: Not on file  Transportation Needs: Not on file  Physical Activity: Not on file  Stress: Not on file  Social Connections: Not on file  Intimate Partner Violence: Not on file     Current Outpatient Medications:  .  acetaminophen (TYLENOL) 500 MG tablet, Take 1,000 mg by mouth every 6 (six) hours as needed., Disp: , Rfl:  .  albuterol (PROVENTIL) (2.5 MG/3ML) 0.083% nebulizer  solution, USE 1 VIAL IN NEBULIZER ONCE DAILY AS NEEDED, Disp: 75 mL, Rfl: 0 .  aspirin 81 MG chewable tablet, Chew 81 mg by mouth daily., Disp: , Rfl:  .  ciprofloxacin (CILOXAN) 0.3 % ophthalmic solution, Place 1 drop into both eyes daily at 2 PM. Administer 1 drop, every 2 hours, while awake, for 2 days. Then 1 drop, every 4 hours, while awake, for the next 5 days., Disp: 5 mL, Rfl: 6 .  clopidogrel (PLAVIX) 75 MG tablet, Take 1 tablet by mouth once daily, Disp: 90 tablet, Rfl: 0 .  donepezil (ARICEPT ODT) 5 MG disintegrating tablet, Take 1 tablet (5 mg total) by mouth at bedtime., Disp: 90 tablet, Rfl: 2 .  furosemide (LASIX) 40 MG tablet, Take 1 tablet (40 mg total) by mouth daily., Disp: 30 tablet, Rfl: 6 .  gabapentin (NEURONTIN) 100 MG capsule, Take 1 capsule (100 mg total) by mouth 3 (three) times daily., Disp: 90 capsule, Rfl: 3 .  gentamicin (GENTAK) 0.3 % ophthalmic ointment, Place into both eyes 3 (three) times daily., Disp: 3.5 g, Rfl: 6 .  levothyroxine (SYNTHROID) 125 MCG tablet, Take 1 tablet by mouth once daily, Disp: 90 tablet, Rfl: 0 .  meloxicam (MOBIC) 7.5 MG tablet, Take 1 tablet by mouth  once daily, Disp: 90 tablet, Rfl: 0 .  omeprazole (PRILOSEC) 40 MG capsule, Take 1 capsule (40 mg total) by mouth 2 (two) times daily., Disp: 180 capsule, Rfl: 2 .  pravastatin (PRAVACHOL) 40 MG tablet, TAKE 1 TABLET BY MOUTH AT BEDTIME, Disp: 90 tablet, Rfl: 0   Allergies  Allergen Reactions  . Gabapentin Other (See Comments)    Dizziness   . Sulfa Antibiotics Other (See Comments)  . Sulfonamide Derivatives     ROS Review of Systems  Constitutional: Negative.   HENT: Negative.   Eyes: Negative.   Respiratory: Negative.   Cardiovascular: Negative.   Gastrointestinal: Negative.   Endocrine: Negative.   Genitourinary: Negative.   Musculoskeletal: Negative.   Skin: Negative.   Allergic/Immunologic: Negative.   Neurological: Negative.   Hematological: Negative.    Psychiatric/Behavioral: Negative.   All other systems reviewed and are negative.     Objective:    Physical Exam Vitals reviewed.  Constitutional:      Appearance: Normal appearance.  HENT:     Mouth/Throat:     Mouth: Mucous membranes are moist.  Eyes:     Pupils: Pupils are equal, round, and reactive to light.  Neck:     Vascular: No carotid bruit.  Cardiovascular:     Rate and Rhythm: Normal rate and regular rhythm.     Pulses: Normal pulses.     Heart sounds: Normal heart sounds.  Pulmonary:     Effort: Pulmonary effort is normal.     Breath sounds: Normal breath sounds.  Abdominal:     General: Bowel sounds are normal.     Palpations: Abdomen is soft. There is no hepatomegaly, splenomegaly or mass.     Tenderness: There is no abdominal tenderness.     Hernia: No hernia is present.  Musculoskeletal:        General: No tenderness.     Cervical back: Neck supple.     Right lower leg: No edema.     Left lower leg: No edema.  Skin:    Findings: No rash.  Neurological:     Mental Status: She is alert and oriented to person, place, and time.     Motor: No weakness.  Psychiatric:        Mood and Affect: Mood and affect normal.        Behavior: Behavior normal.     Ht 5\' 5"  (1.651 m)   Wt 157 lb (71.2 kg)   BMI 26.13 kg/m  Wt Readings from Last 3 Encounters:  01/23/21 157 lb (71.2 kg)  01/19/21 157 lb 4.8 oz (71.4 kg)  11/17/20 156 lb (70.8 kg)     Health Maintenance Due  Topic Date Due  . COVID-19 Vaccine (1) Never done  . Pneumococcal Vaccine 26-13 Years old (1 of 2 - PPSV23) Never done  . Zoster Vaccines- Shingrix (1 of 2) Never done  . DEXA SCAN  Never done  . PNA vac Low Risk Adult (1 of 2 - PCV13) Never done    There are no preventive care reminders to display for this patient.  Lab Results  Component Value Date   TSH 0.11 (L) 04/26/2020   Lab Results  Component Value Date   WBC 11.2 (H) 04/26/2020   HGB 13.3 04/26/2020   HCT 39.9  04/26/2020   MCV 90.3 04/26/2020   PLT 249 04/26/2020   Lab Results  Component Value Date   NA 140 04/26/2020   K 4.4 04/26/2020   CO2  27 04/26/2020   GLUCOSE 81 04/26/2020   BUN 26 (H) 04/26/2020   CREATININE 0.76 04/26/2020   BILITOT 0.6 04/26/2020   ALKPHOS 52 04/24/2019   AST 15 04/26/2020   ALT 8 04/26/2020   PROT 6.4 04/26/2020   ALBUMIN 4.1 04/24/2019   CALCIUM 9.6 04/26/2020   ANIONGAP 11 04/24/2019   Lab Results  Component Value Date   CHOL 182 04/26/2020   Lab Results  Component Value Date   HDL 71 04/26/2020   Lab Results  Component Value Date   LDLCALC 92 04/26/2020   Lab Results  Component Value Date   TRIG 97 04/26/2020   Lab Results  Component Value Date   CHOLHDL 2.6 04/26/2020   Lab Results  Component Value Date   HGBA1C 6.3 (H) 09/24/2016      Assessment & Plan:   Problem List Items Addressed This Visit   None     Meds ordered this encounter  Medications  . gentamicin (GENTAK) 0.3 % ophthalmic ointment    Sig: Place into both eyes 3 (three) times daily.    Dispense:  3.5 g    Refill:  6    Follow-up: No follow-ups on file.    Corky Downs, MD

## 2021-01-23 NOTE — Assessment & Plan Note (Signed)
Voltaren gel will help with that neck pain.

## 2021-01-23 NOTE — Assessment & Plan Note (Signed)
Memory loss is gradual we will continue with Aricept

## 2021-01-23 NOTE — Assessment & Plan Note (Signed)
Stable on the medicine

## 2021-02-06 ENCOUNTER — Other Ambulatory Visit: Payer: Self-pay | Admitting: *Deleted

## 2021-02-06 MED ORDER — AZITHROMYCIN 250 MG PO TABS: ORAL_TABLET | ORAL | 0 refills | Status: AC

## 2021-02-06 MED ORDER — METHYLPREDNISOLONE 4 MG PO TBPK: ORAL_TABLET | ORAL | 0 refills | Status: DC

## 2021-02-16 ENCOUNTER — Other Ambulatory Visit: Payer: Self-pay | Admitting: *Deleted

## 2021-02-16 MED ORDER — AMOXICILLIN-POT CLAVULANATE 875-125 MG PO TABS: 1.0000 | ORAL_TABLET | Freq: Two times a day (BID) | ORAL | 0 refills | Status: AC

## 2021-02-23 DIAGNOSIS — J439 Emphysema, unspecified: Secondary | ICD-10-CM | POA: Diagnosis not present

## 2021-02-23 DIAGNOSIS — R0600 Dyspnea, unspecified: Secondary | ICD-10-CM | POA: Diagnosis not present

## 2021-02-23 DIAGNOSIS — K449 Diaphragmatic hernia without obstruction or gangrene: Secondary | ICD-10-CM | POA: Diagnosis not present

## 2021-02-23 DIAGNOSIS — I517 Cardiomegaly: Secondary | ICD-10-CM | POA: Diagnosis not present

## 2021-02-23 DIAGNOSIS — R059 Cough, unspecified: Secondary | ICD-10-CM | POA: Diagnosis not present

## 2021-02-23 DIAGNOSIS — G4733 Obstructive sleep apnea (adult) (pediatric): Secondary | ICD-10-CM | POA: Diagnosis not present

## 2021-03-06 ENCOUNTER — Other Ambulatory Visit: Payer: Self-pay | Admitting: Internal Medicine

## 2021-03-09 ENCOUNTER — Other Ambulatory Visit: Payer: Self-pay

## 2021-03-09 ENCOUNTER — Encounter: Payer: Self-pay | Admitting: Emergency Medicine

## 2021-03-09 ENCOUNTER — Emergency Department: Payer: Medicare Other

## 2021-03-09 ENCOUNTER — Inpatient Hospital Stay
Admission: EM | Admit: 2021-03-09 | Discharge: 2021-03-12 | DRG: 690 | Disposition: A | Discharge status: Home Health | Payer: Medicare Other | Attending: Student | Admitting: Student

## 2021-03-09 DIAGNOSIS — R55 Syncope and collapse: Secondary | ICD-10-CM | POA: Diagnosis not present

## 2021-03-09 DIAGNOSIS — Z9181 History of falling: Secondary | ICD-10-CM | POA: Diagnosis not present

## 2021-03-09 DIAGNOSIS — Z888 Allergy status to other drugs, medicaments and biological substances status: Secondary | ICD-10-CM | POA: Diagnosis not present

## 2021-03-09 DIAGNOSIS — K449 Diaphragmatic hernia without obstruction or gangrene: Secondary | ICD-10-CM | POA: Diagnosis not present

## 2021-03-09 DIAGNOSIS — D638 Anemia in other chronic diseases classified elsewhere: Secondary | ICD-10-CM | POA: Diagnosis present

## 2021-03-09 DIAGNOSIS — Z79899 Other long term (current) drug therapy: Secondary | ICD-10-CM

## 2021-03-09 DIAGNOSIS — R0902 Hypoxemia: Secondary | ICD-10-CM | POA: Diagnosis not present

## 2021-03-09 DIAGNOSIS — Z79890 Hormone replacement therapy (postmenopausal): Secondary | ICD-10-CM

## 2021-03-09 DIAGNOSIS — R531 Weakness: Principal | ICD-10-CM

## 2021-03-09 DIAGNOSIS — K219 Gastro-esophageal reflux disease without esophagitis: Secondary | ICD-10-CM | POA: Diagnosis present

## 2021-03-09 DIAGNOSIS — Z791 Long term (current) use of non-steroidal anti-inflammatories (NSAID): Secondary | ICD-10-CM

## 2021-03-09 DIAGNOSIS — Z7902 Long term (current) use of antithrombotics/antiplatelets: Secondary | ICD-10-CM

## 2021-03-09 DIAGNOSIS — E039 Hypothyroidism, unspecified: Secondary | ICD-10-CM | POA: Diagnosis present

## 2021-03-09 DIAGNOSIS — M1710 Unilateral primary osteoarthritis, unspecified knee: Secondary | ICD-10-CM | POA: Diagnosis present

## 2021-03-09 DIAGNOSIS — Z882 Allergy status to sulfonamides status: Secondary | ICD-10-CM

## 2021-03-09 DIAGNOSIS — Z01818 Encounter for other preprocedural examination: Secondary | ICD-10-CM | POA: Diagnosis not present

## 2021-03-09 DIAGNOSIS — N390 Urinary tract infection, site not specified: Principal | ICD-10-CM | POA: Diagnosis present

## 2021-03-09 DIAGNOSIS — N3090 Cystitis, unspecified without hematuria: Secondary | ICD-10-CM

## 2021-03-09 DIAGNOSIS — F32A Depression, unspecified: Secondary | ICD-10-CM | POA: Diagnosis not present

## 2021-03-09 DIAGNOSIS — Z743 Need for continuous supervision: Secondary | ICD-10-CM | POA: Diagnosis not present

## 2021-03-09 DIAGNOSIS — G9341 Metabolic encephalopathy: Secondary | ICD-10-CM

## 2021-03-09 DIAGNOSIS — M25552 Pain in left hip: Secondary | ICD-10-CM | POA: Diagnosis not present

## 2021-03-09 DIAGNOSIS — W19XXXA Unspecified fall, initial encounter: Secondary | ICD-10-CM

## 2021-03-09 DIAGNOSIS — B9620 Unspecified Escherichia coli [E. coli] as the cause of diseases classified elsewhere: Secondary | ICD-10-CM | POA: Diagnosis present

## 2021-03-09 DIAGNOSIS — M179 Osteoarthritis of knee, unspecified: Secondary | ICD-10-CM | POA: Diagnosis present

## 2021-03-09 DIAGNOSIS — I517 Cardiomegaly: Secondary | ICD-10-CM | POA: Diagnosis not present

## 2021-03-09 DIAGNOSIS — R6889 Other general symptoms and signs: Secondary | ICD-10-CM | POA: Diagnosis not present

## 2021-03-09 DIAGNOSIS — G4700 Insomnia, unspecified: Secondary | ICD-10-CM | POA: Diagnosis present

## 2021-03-09 DIAGNOSIS — Z20822 Contact with and (suspected) exposure to covid-19: Secondary | ICD-10-CM | POA: Diagnosis not present

## 2021-03-09 DIAGNOSIS — R4189 Other symptoms and signs involving cognitive functions and awareness: Secondary | ICD-10-CM | POA: Diagnosis present

## 2021-03-09 DIAGNOSIS — Z7982 Long term (current) use of aspirin: Secondary | ICD-10-CM | POA: Diagnosis not present

## 2021-03-09 DIAGNOSIS — G8929 Other chronic pain: Secondary | ICD-10-CM

## 2021-03-09 DIAGNOSIS — R0602 Shortness of breath: Secondary | ICD-10-CM | POA: Diagnosis not present

## 2021-03-09 DIAGNOSIS — I10 Essential (primary) hypertension: Secondary | ICD-10-CM | POA: Diagnosis not present

## 2021-03-09 DIAGNOSIS — J90 Pleural effusion, not elsewhere classified: Secondary | ICD-10-CM | POA: Diagnosis not present

## 2021-03-09 DIAGNOSIS — Z66 Do not resuscitate: Secondary | ICD-10-CM | POA: Diagnosis not present

## 2021-03-09 DIAGNOSIS — E860 Dehydration: Secondary | ICD-10-CM | POA: Diagnosis present

## 2021-03-09 DIAGNOSIS — Z043 Encounter for examination and observation following other accident: Secondary | ICD-10-CM | POA: Diagnosis not present

## 2021-03-09 DIAGNOSIS — M25462 Effusion, left knee: Secondary | ICD-10-CM | POA: Diagnosis not present

## 2021-03-09 DIAGNOSIS — E785 Hyperlipidemia, unspecified: Secondary | ICD-10-CM | POA: Diagnosis not present

## 2021-03-09 DIAGNOSIS — M25562 Pain in left knee: Secondary | ICD-10-CM | POA: Diagnosis not present

## 2021-03-09 LAB — URINALYSIS, COMPLETE (UACMP) WITH MICROSCOPIC
Bacteria, UA: NONE SEEN
Bilirubin Urine: NEGATIVE
Glucose, UA: NEGATIVE mg/dL
Hgb urine dipstick: NEGATIVE
Ketones, ur: NEGATIVE mg/dL
Nitrite: POSITIVE — AB
Protein, ur: 30 mg/dL — AB
Specific Gravity, Urine: 1.016 (ref 1.005–1.030)
WBC, UA: >50 WBC/hpf — ABNORMAL HIGH (ref 0–5)
pH: 7 (ref 5.0–8.0)

## 2021-03-09 LAB — CBC
HCT: 37.7 % (ref 36.0–46.0)
Hemoglobin: 12.4 g/dL (ref 12.0–15.0)
MCH: 29.9 pg (ref 26.0–34.0)
MCHC: 32.9 g/dL (ref 30.0–36.0)
MCV: 90.8 fL (ref 80.0–100.0)
Platelets: 220 10*3/uL (ref 150–400)
RBC: 4.15 MIL/uL (ref 3.87–5.11)
RDW: 13.3 % (ref 11.5–15.5)
WBC: 10.6 10*3/uL — ABNORMAL HIGH (ref 4.0–10.5)
nRBC: 0 % (ref 0.0–0.2)

## 2021-03-09 LAB — RESP PANEL BY RT-PCR (FLU A&B, COVID) ARPGX2
Influenza A by PCR: NEGATIVE
Influenza B by PCR: NEGATIVE
SARS Coronavirus 2 by RT PCR: NEGATIVE

## 2021-03-09 LAB — BASIC METABOLIC PANEL
Anion gap: 12 (ref 5–15)
BUN: 23 mg/dL (ref 8–23)
CO2: 25 mmol/L (ref 22–32)
Calcium: 9.1 mg/dL (ref 8.9–10.3)
Chloride: 104 mmol/L (ref 98–111)
Creatinine, Ser: 0.59 mg/dL (ref 0.44–1.00)
GFR, Estimated: >60 mL/min (ref >60)
Glucose, Bld: 102 mg/dL — ABNORMAL HIGH (ref 70–99)
Potassium: 4.1 mmol/L (ref 3.5–5.1)
Sodium: 141 mmol/L (ref 135–145)

## 2021-03-09 MED ORDER — ALBUTEROL SULFATE (2.5 MG/3ML) 0.083% IN NEBU: 2.5000 mg | INHALATION_SOLUTION | Freq: Four times a day (QID) | RESPIRATORY_TRACT | Status: DC | PRN

## 2021-03-09 MED ORDER — PRAVASTATIN SODIUM 20 MG PO TABS
40.0000 mg | ORAL_TABLET | Freq: Every day | ORAL | Status: DC
  Administered 2021-03-10 – 2021-03-11 (×2): 40 mg via ORAL
  Filled 2021-03-09 (×2): qty 2

## 2021-03-09 MED ORDER — GABAPENTIN 100 MG PO CAPS
100.0000 mg | ORAL_CAPSULE | Freq: Three times a day (TID) | ORAL | Status: DC
  Administered 2021-03-09 – 2021-03-12 (×10): 100 mg via ORAL
  Filled 2021-03-09 (×10): qty 1

## 2021-03-09 MED ORDER — ONDANSETRON HCL 4 MG/2ML IJ SOLN
4.0000 mg | Freq: Once | INTRAMUSCULAR | Status: AC
  Administered 2021-03-09: 4 mg via INTRAVENOUS
  Filled 2021-03-09: qty 2

## 2021-03-09 MED ORDER — DONEPEZIL HCL 5 MG PO TABS
5.0000 mg | ORAL_TABLET | Freq: Every day | ORAL | Status: DC
  Administered 2021-03-09 – 2021-03-11 (×3): 5 mg via ORAL
  Filled 2021-03-09 (×3): qty 1

## 2021-03-09 MED ORDER — CEFTRIAXONE SODIUM 1 G IJ SOLR
1.0000 g | INTRAMUSCULAR | Status: DC
  Administered 2021-03-10 – 2021-03-11 (×2): 1 g via INTRAVENOUS
  Filled 2021-03-09 (×2): qty 10
  Filled 2021-03-09: qty 1

## 2021-03-09 MED ORDER — SODIUM CHLORIDE 0.9 % IV BOLUS
1000.0000 mL | Freq: Once | INTRAVENOUS | Status: AC
  Administered 2021-03-09: 1000 mL via INTRAVENOUS

## 2021-03-09 MED ORDER — SODIUM CHLORIDE 0.9 % IV SOLN: INTRAVENOUS | Status: AC

## 2021-03-09 MED ORDER — PREGABALIN 50 MG PO CAPS
50.0000 mg | ORAL_CAPSULE | Freq: Two times a day (BID) | ORAL | Status: DC
  Administered 2021-03-09 – 2021-03-12 (×6): 50 mg via ORAL
  Filled 2021-03-09 (×6): qty 1

## 2021-03-09 MED ORDER — ASPIRIN 81 MG PO CHEW
81.0000 mg | CHEWABLE_TABLET | Freq: Every day | ORAL | Status: DC
  Administered 2021-03-09 – 2021-03-12 (×4): 81 mg via ORAL
  Filled 2021-03-09 (×4): qty 1

## 2021-03-09 MED ORDER — SODIUM CHLORIDE 0.9 % IV SOLN
1.0000 g | Freq: Once | INTRAVENOUS | Status: AC
  Administered 2021-03-09: 1 g via INTRAVENOUS
  Filled 2021-03-09: qty 10

## 2021-03-09 MED ORDER — ENOXAPARIN SODIUM 40 MG/0.4ML IJ SOSY
40.0000 mg | PREFILLED_SYRINGE | INTRAMUSCULAR | Status: DC
  Administered 2021-03-09 – 2021-03-11 (×3): 40 mg via SUBCUTANEOUS
  Filled 2021-03-09 (×3): qty 0.4

## 2021-03-09 MED ORDER — ONDANSETRON HCL 4 MG/2ML IJ SOLN: 4.0000 mg | Freq: Four times a day (QID) | INTRAMUSCULAR | Status: DC | PRN

## 2021-03-09 MED ORDER — PANTOPRAZOLE SODIUM 40 MG PO TBEC
40.0000 mg | DELAYED_RELEASE_TABLET | Freq: Every day | ORAL | Status: DC
  Administered 2021-03-09 – 2021-03-11 (×3): 40 mg via ORAL
  Filled 2021-03-09 (×3): qty 1

## 2021-03-09 MED ORDER — ACETAMINOPHEN 500 MG PO TABS
1000.0000 mg | ORAL_TABLET | Freq: Four times a day (QID) | ORAL | Status: DC | PRN
  Administered 2021-03-09 – 2021-03-11 (×2): 1000 mg via ORAL
  Filled 2021-03-09 (×2): qty 2

## 2021-03-09 MED ORDER — ONDANSETRON HCL 4 MG PO TABS: 4.0000 mg | ORAL_TABLET | Freq: Four times a day (QID) | ORAL | Status: DC | PRN

## 2021-03-09 MED ORDER — LEVOTHYROXINE SODIUM 25 MCG PO TABS
125.0000 ug | ORAL_TABLET | Freq: Every day | ORAL | Status: DC
  Administered 2021-03-10 – 2021-03-12 (×3): 125 ug via ORAL
  Filled 2021-03-09 (×3): qty 1

## 2021-03-09 MED ORDER — CLOPIDOGREL BISULFATE 75 MG PO TABS
75.0000 mg | ORAL_TABLET | Freq: Every day | ORAL | Status: DC
  Administered 2021-03-10 – 2021-03-12 (×3): 75 mg via ORAL
  Filled 2021-03-09 (×3): qty 1

## 2021-03-09 NOTE — ED Provider Notes (Signed)
[facility] Emergency Department Provider Note  ____________________________________________  Time seen: Approximately 1:27 PM  I have reviewed the triage vital signs and the nursing notes.   HISTORY  Chief Complaint Fall, Knee Pain (left), and Near Syncope    HPI Abigail Strong is a 87 y.o. female with a history of GERD hypothyroidism hyperlipidemia and chronic bilateral knee pain who comes ED complaining of fall at home resulting in worsening left knee pain.  Denies chest pain or shortness of breath.  She reports that for the last 2 days she has felt weaker than usual with tiredness, decreased oral intake, nausea.  No vomiting or diarrhea.  Today she did not hit her head, no neck pain or headache.  Pain complaints are nonradiating, no aggravating or alleviating factors.  After fall and she was unable to get herself back up off the floor in her home and had to call EMS to be able to pick her up.  She normally uses a walker at home.      Past Medical History:  Diagnosis Date  . Depression   . GERD (gastroesophageal reflux disease)   . Hyperlipidemia   . Hyperlipidemia, unspecified 01/01/2014  . Hypothyroidism   . Insomnia   . Musculoskeletal chest pain 09/27/2016     Patient Active Problem List   Diagnosis Date Noted  . Skin tear of lower leg without complication, left, initial encounter 01/19/2021  . Memory loss 01/19/2021  . Breast pain, left 11/17/2020  . Right shoulder pain 09/01/2020  . Disorder of rotator cuff, left 09/01/2020  . Cervical spine arthritis 07/21/2020  . Neuralgia of chest 07/21/2020  . Annual physical exam 05/17/2020  . Need for influenza vaccination 05/17/2020  . Osteoarthritis of both knees 04/26/2020  . Dizzinesses 01/21/2020  . Swelling of limb 01/12/2020  . Bruit of right carotid artery 01/12/2020  . Edema of lower leg due to peripheral venous insufficiency 12/28/2019  . Hip pain 12/25/2018  . Partial small bowel  obstruction ([place]) 09/28/2018  . Arthritis of knee 02/19/2017  . COPD exacerbation ([place]) 09/27/2016  . Pneumonia 09/27/2016  . Influenza A 09/27/2016  . Prediabetes 09/27/2016  . Leukocytosis 09/27/2016  . Chronic fatigue, unspecified 09/27/2016  . Acute respiratory failure with hypoxia ([place]) 09/22/2016  . Bronchitis 09/09/2016  . Osteoarthritis of knee 03/23/2016  . Diverticulitis 03/23/2015  . Depression 01/01/2014  . GERD (gastroesophageal reflux disease) 01/01/2014  . Hypothyroidism, unspecified 01/01/2014  . OA (osteoarthritis) 01/01/2014  . Essential hypertension 12/08/2009  . COUGH 12/08/2009     Past Surgical History:  Procedure Laterality Date  . ABDOMINAL HYSTERECTOMY    . BREAST BIOPSY Bilateral 90's   benign  . cataracts    . COLON SURGERY     partial colectomy  . intestinal blockage    . NISSEN FUNDOPLICATION       Prior to Admission medications   Medication Sig Start Date End Date Taking? Authorizing Provider  acetaminophen (TYLENOL) 500 MG tablet Take 1,000 mg by mouth every 6 (six) hours as needed.    [provider]  albuterol (PROVENTIL) (2.5 MG/3ML) 0.083% nebulizer solution USE 1 VIAL IN NEBULIZER ONCE DAILY AS NEEDED 10/06/20   Beckie Salts, FNP  aspirin 81 MG chewable tablet Chew 81 mg by mouth daily.    [provider]  ciprofloxacin (CILOXAN) 0.3 % ophthalmic solution Place 1 drop into both eyes daily at 2 PM. Administer 1 drop, every 2 hours, while awake, for 2 days. Then 1  drop, every 4 hours, while awake, for the next 5 days. 10/13/20   Beckie Salts, FNP  clopidogrel (PLAVIX) 75 MG tablet Take 1 tablet by mouth once daily 01/07/21   Cletis Athens, MD  donepezil (ARICEPT ODT) 5 MG disintegrating tablet Take 1 tablet (5 mg total) by mouth at bedtime. 01/19/21 04/19/21  Beckie Salts, FNP  furosemide (LASIX) 40 MG tablet Take 1 tablet (40 mg total) by mouth daily. 01/28/20   Cletis Athens, MD  gabapentin (NEURONTIN) 100 MG capsule TAKE  1 CAPSULE BY MOUTH THREE TIMES DAILY 03/06/21   Cletis Athens, MD  gentamicin (GENTAK) 0.3 % ophthalmic ointment Place into both eyes 3 (three) times daily. 01/23/21   Cletis Athens, MD  levothyroxine (SYNTHROID) 125 MCG tablet Take 1 tablet by mouth once daily 12/05/20   Cletis Athens, MD  meloxicam (MOBIC) 7.5 MG tablet Take 1 tablet by mouth once daily 01/07/21   Cletis Athens, MD  methylPREDNISolone (MEDROL DOSEPAK) 4 MG TBPK tablet Take by mouth as directed. 02/06/21   Cletis Athens, MD  omeprazole (PRILOSEC) 40 MG capsule Take 1 capsule (40 mg total) by mouth 2 (two) times daily. 01/19/21   Beckie Salts, FNP  pravastatin (PRAVACHOL) 40 MG tablet TAKE 1 TABLET BY MOUTH AT BEDTIME 01/05/21   Cletis Athens, MD  pregabalin (LYRICA) 50 MG capsule Take 1 capsule (50 mg total) by mouth 2 (two) times daily. 01/23/21   Cletis Athens, MD     Allergies Gabapentin, Sulfa antibiotics, and Sulfonamide derivatives   Family History  Problem Relation Age of Onset  . Cervical cancer Mother   . Breast cancer Maternal Aunt     Social History Social History   Tobacco Use  . Smoking status: Never  . Smokeless tobacco: Never  Substance Use Topics  . Alcohol use: No  . Drug use: No    Review of Systems  Constitutional:   No fever or chills.  ENT:   No sore throat. No rhinorrhea. Cardiovascular:   No chest pain or syncope. Respiratory:   No dyspnea or cough. Gastrointestinal:   Negative for abdominal pain, vomiting and diarrhea.  Musculoskeletal:   Positive acute on chronic left knee pain All other systems reviewed and are negative except as documented above in ROS and HPI.  ____________________________________________   PHYSICAL EXAM:  VITAL SIGNS: ED Triage Vitals  Enc Vitals Group     BP 03/09/21 1151 (!) 97/49     Pulse Rate 03/09/21 1151 84     Resp --      Temp 03/09/21 1151 100.1 F (37.8 C)     Temp Source 03/09/21 1151 Oral     SpO2 03/09/21 1151 93 %     Weight 03/09/21 1152  156 lb 15.5 oz (71.2 kg)     Height --      Head Circumference --      Peak Flow --      Pain Score --      Pain Loc --      Pain Edu? --      Excl. in [place]? --     Vital signs reviewed, nursing assessments reviewed.   Constitutional:   Alert and oriented. Non-toxic appearance. Eyes:   Conjunctivae are normal. EOMI. PERRL. ENT      Head:   Normocephalic and atraumatic.      Nose:   Normal.      Mouth/Throat:   Dry mucous membranes.      Neck:   No meningismus. Full  ROM. Hematological/Lymphatic/Immunilogical:   No cervical lymphadenopathy. Cardiovascular:   RRR. Symmetric bilateral radial and DP pulses.  No murmurs. Cap refill less than 2 seconds. Respiratory:   Normal respiratory effort without tachypnea/retractions. Breath sounds are clear and equal bilaterally. No wheezes/rales/rhonchi. Gastrointestinal:   Soft and nontender. Non distended. There is no CVA tenderness.  No rebound, rigidity, or guarding. Genitourinary:   deferred Musculoskeletal:   Normal range of motion in all extremities. No joint effusions.  Diffuse tenderness about the left knee without deformity.  There is also tenderness at the left hip greater trochanter area.. Neurologic:   Normal speech and language.  Motor grossly intact. No acute focal neurologic deficits are appreciated.  Skin:    Skin is warm, dry and intact. No rash noted.  No petechiae, purpura, or bullae.  ____________________________________________    LABS (pertinent positives/negatives) (all labs ordered are listed, but only abnormal results are displayed) Labs Reviewed  BASIC METABOLIC PANEL - Abnormal; Notable for the following components:      Result Value   Glucose, Bld 102 (*)    All other components within normal limits  CBC - Abnormal; Notable for the following components:   WBC 10.6 (*)    All other components within normal limits  RESP PANEL BY RT-PCR (FLU A&B, COVID) ARPGX2  URINALYSIS, COMPLETE (UACMP) WITH MICROSCOPIC  CBG  MONITORING, ED   ____________________________________________   EKG  Interpreted by me Sinus rhythm rate of 93, left axis, normal intervals.  Normal QRS ST segments and T waves.  ____________________________________________    RADIOLOGY  No results found.  ____________________________________________   PROCEDURES Procedures  ____________________________________________  DIFFERENTIAL DIAGNOSIS   Dehydration, electrolyte abnormality, viral illness, UTI, pneumonia, knee fracture, hip fracture  CLINICAL IMPRESSION / ASSESSMENT AND PLAN / ED COURSE  Medications ordered in the ED: Medications  sodium chloride 0.9 % bolus 1,000 mL (has no administration in time range)  ondansetron (ZOFRAN) injection 4 mg (4 mg Intravenous Given 03/09/21 1320)    Pertinent labs & imaging results that were available during my care of the patient were reviewed by me and considered in my medical decision making (see chart for details).  Abigail Strong was evaluated in Emergency Department on 03/09/2021 for the symptoms described in the history of present illness. She was evaluated in the context of the global COVID-19 pandemic, which necessitated consideration that the patient might be at risk for infection with the SARS-CoV-2 virus that causes COVID-19. Institutional protocols and algorithms that pertain to the evaluation of patients at risk for COVID-19 are in a state of rapid change based on information released by regulatory bodies including the CDC and federal and state organizations. These policies and algorithms were followed during the patient's care in the ED.   Patient presents with generalized weakness and mechanical fall likely precipitated by her constitutional symptoms.  No significant traumatic findings on exam.  Vital signs show a borderline elevated body temperature.  She appears dehydrated.  Not septic.  Will give IV fluids, check labs chest x-ray and COVID test.  Clinical Course as  of 03/09/21 1435  Thu Mar 09, 2021  1434 X-rays viewed and interpreted by me, appears unremarkable, no traumatic injury.  No evidence of pneumonia or pneumothorax on the chest x-ray.  COVID and flu negative.  Urinalysis positive for UTI.  No think the patient is septic.  She has hypotension due to dehydration.  With her falls and confusion, I will give IV Rocephin and consult hospitalist. [PS]  Clinical Course User Index [PS] Carrie Mew, MD     ____________________________________________   FINAL CLINICAL IMPRESSION(S) / ED DIAGNOSES    Final diagnoses:  Generalized weakness  Fall, initial encounter  Chronic pain of left knee     ED Discharge Orders     None       Portions of this note were generated with dragon dictation software. Dictation errors may occur despite best attempts at proofreading.    Carrie Mew, MD 03/09/21 1331

## 2021-03-09 NOTE — Plan of Care (Signed)
  Problem: Urinary Elimination: Goal: Signs and symptoms of infection will decrease Outcome: Progressing   Problem: Education: Goal: Knowledge of General Education information will improve Description: Including pain rating scale, medication(s)/side effects and non-pharmacologic comfort measures Outcome: Progressing   Problem: Health Behavior/Discharge Planning: Goal: Ability to manage health-related needs will improve Outcome: Progressing   Problem: Clinical Measurements: Goal: Ability to maintain clinical measurements within normal limits will improve Outcome: Progressing Goal: Will remain free from infection Outcome: Progressing Goal: Diagnostic test results will improve Outcome: Progressing Goal: Respiratory complications will improve Outcome: Progressing Goal: Cardiovascular complication will be avoided Outcome: Progressing   Problem: Activity: Goal: Risk for activity intolerance will decrease Outcome: Progressing   Problem: Nutrition: Goal: Adequate nutrition will be maintained Outcome: Progressing   Problem: Elimination: Goal: Will not experience complications related to bowel motility Outcome: Progressing Goal: Will not experience complications related to urinary retention Outcome: Progressing   Problem: Pain Managment: Goal: General experience of comfort will improve Outcome: Progressing   Problem: Safety: Goal: Ability to remain free from injury will improve Outcome: Progressing   Problem: Skin Integrity: Goal: Risk for impaired skin integrity will decrease Outcome: Progressing

## 2021-03-09 NOTE — H&P (Signed)
History and Physical    Abigail Strong WNU:[id number] DOB: 1930/01/05 DOA: 03/09/2021  PCP: Corky Downs, MD   Patient coming from: Home.  I have personally briefly reviewed patient's old medical records in [facility]  Chief Complaint: Fall  HPI: Abigail Strong is a 87 y.o. female with medical history significant for GERD, hypothyroidism, dyslipidemia and osteoarthritis who presents to the ER via EMS for evaluation of worsening left knee pain following a fall this morning. Patient states that she has not felt well over the last 3 days and complains of dysuria and frequency.  On the morning of her admission she states that she tried to get out of her lift chair and her knees gave way so she fell landing on her knees.  She denies hitting her head or any loss of consciousness.  At baseline she ambulates with a rolling walker but has been too weak to use her walker in the last 24 hours. She has had chills at home as well as fever but no documented. She denies having any nausea, no vomiting, no abdominal pain, no back pain, no diaphoresis, no headache, no blurred vision, no focal deficits, no changes in her bowel habits, no chest pain, no shortness of breath. Labs show sodium 141, potassium 4.1, chloride 104, bicarb 25, glucose 102, BUN 23, creatinine 0.59, calcium 9.0, white count 10.6, hemoglobin 12.4, hematocrit 37.7, MCV 90.8, RDW 13.3, platelet count 220 Respiratory viral panel is negative Urine analysis shows pyuria Chest x-ray reviewed by me shows clear lungs, small left pleural effusion. Left knee x-ray shows tricompartmental degenerative change.  Negative for fracture. X-ray of the pelvis is negative for fracture Twelve-lead EKG reviewed by me shows sinus rhythm with LVH.   ED Course: Patient is an 87 year old Caucasian female who was brought into the ER by EMS for evaluation after a fall at home secondary to weakness.  She has had urinary symptoms for about 2 days which  she describes as dysuria and frequency and urinalysis shows pyuria. Prior urine culture yielded E. Coli. Patient received a dose of IV Rocephin in the ER and will be admitted to the hospital for further evaluation.   Review of Systems: As per HPI otherwise all other systems reviewed and negative.    Past Medical History:  Diagnosis Date   Depression    GERD (gastroesophageal reflux disease)    Hyperlipidemia    Hyperlipidemia, unspecified 01/01/2014   Hypothyroidism    Insomnia    Musculoskeletal chest pain 09/27/2016    Past Surgical History:  Procedure Laterality Date   ABDOMINAL HYSTERECTOMY     BREAST BIOPSY Bilateral 90's   benign   cataracts     COLON SURGERY     partial colectomy   intestinal blockage     NISSEN FUNDOPLICATION       reports that she has never smoked. She has never used smokeless tobacco. She reports that she does not drink alcohol and does not use drugs.  Allergies  Allergen Reactions   Gabapentin Other (See Comments)    Dizziness    Sulfa Antibiotics Other (See Comments)   Sulfonamide Derivatives     Family History  Problem Relation Age of Onset   Cervical cancer Mother    Breast cancer Maternal Aunt       Prior to Admission medications   Medication Sig Start Date End Date Taking? Authorizing Provider  acetaminophen (TYLENOL) 500 MG tablet Take 1,000 mg by mouth every 6 (six) hours as  needed for mild pain or moderate pain.   Yes [provider]  albuterol (PROVENTIL) (2.5 MG/3ML) 0.083% nebulizer solution USE 1 VIAL IN NEBULIZER ONCE DAILY AS NEEDED Patient taking differently: Take 2.5 mg by nebulization daily as needed for wheezing or shortness of breath. 10/06/20  Yes Irish Lack, FNP  aspirin 81 MG chewable tablet Chew 81 mg by mouth daily.   Yes [provider]  clopidogrel (PLAVIX) 75 MG tablet Take 1 tablet by mouth once daily Patient taking differently: Take 75 mg by mouth daily. 01/07/21  Yes Masoud, Renda Rolls, MD   donepezil (ARICEPT ODT) 5 MG disintegrating tablet Take 1 tablet (5 mg total) by mouth at bedtime. 01/19/21 04/19/21 Yes Irish Lack, FNP  gabapentin (NEURONTIN) 100 MG capsule TAKE 1 CAPSULE BY MOUTH THREE TIMES DAILY Patient taking differently: Take 100 mg by mouth 3 (three) times daily. 03/06/21  Yes Masoud, Renda Rolls, MD  levothyroxine (SYNTHROID) 125 MCG tablet Take 1 tablet by mouth once daily Patient taking differently: Take 125 mcg by mouth daily. 12/05/20  Yes Masoud, Renda Rolls, MD  meloxicam (MOBIC) 7.5 MG tablet Take 1 tablet by mouth once daily Patient taking differently: Take 7.5 mg by mouth daily. 01/07/21  Yes Masoud, Renda Rolls, MD  omeprazole (PRILOSEC) 40 MG capsule Take 1 capsule (40 mg total) by mouth 2 (two) times daily. 01/19/21  Yes Irish Lack, FNP  pravastatin (PRAVACHOL) 40 MG tablet TAKE 1 TABLET BY MOUTH AT BEDTIME Patient taking differently: Take 40 mg by mouth at bedtime. 01/05/21  Yes Masoud, Renda Rolls, MD  pregabalin (LYRICA) 50 MG capsule Take 1 capsule (50 mg total) by mouth 2 (two) times daily. 01/23/21  Yes Masoud, Renda Rolls, MD  ciprofloxacin (CILOXAN) 0.3 % ophthalmic solution Place 1 drop into both eyes daily at 2 PM. Administer 1 drop, every 2 hours, while awake, for 2 days. Then 1 drop, every 4 hours, while awake, for the next 5 days. Patient not taking: Reported on 03/09/2021 10/13/20   Irish Lack, FNP  furosemide (LASIX) 40 MG tablet Take 1 tablet (40 mg total) by mouth daily. 01/28/20   Corky Downs, MD  gentamicin (GENTAK) 0.3 % ophthalmic ointment Place into both eyes 3 (three) times daily. Patient not taking: Reported on 03/09/2021 01/23/21   Corky Downs, MD  methylPREDNISolone (MEDROL DOSEPAK) 4 MG TBPK tablet Take by mouth as directed. Patient not taking: Reported on 03/09/2021 02/06/21   Corky Downs, MD    Physical Exam: Vitals:   03/09/21 1151 03/09/21 1152 03/09/21 1450  BP: (!) 97/49  95/64  Pulse: 84  70  Resp:   18  Temp: 100.1 F (37.8 C)  98.3 F (36.8 C)   TempSrc: Oral  Oral  SpO2: 93%  99%  Weight:  71.2 kg      Vitals:   03/09/21 1151 03/09/21 1152 03/09/21 1450  BP: (!) 97/49  95/64  Pulse: 84  70  Resp:   18  Temp: 100.1 F (37.8 C)  98.3 F (36.8 C)  TempSrc: Oral  Oral  SpO2: 93%  99%  Weight:  71.2 kg       Constitutional: Alert and oriented x 3 . Not in any apparent distress HEENT:      Head: Normocephalic and atraumatic.         Eyes: PERLA, EOMI, Conjunctivae are normal. Sclera is non-icteric.       Mouth/Throat: Mucous membranes are moist.       Neck: Supple with no signs of meningismus. Cardiovascular: Regular rate  and rhythm. No murmurs, gallops, or rubs. 2+ symmetrical distal pulses are present . No JVD. No LE edema Respiratory: Respiratory effort normal .Lungs sounds clear bilaterally. No wheezes, crackles, or rhonchi.  Gastrointestinal: Soft, non tender, and non distended with positive bowel sounds.  Genitourinary: No CVA tenderness. Musculoskeletal: Nontender with normal range of motion in all extremities.  Left knee swelling.  Brace of the left lower extremity Neurologic:  Face is symmetric. Moving all extremities. No gross focal neurologic deficits .  Generalized weakness Skin: Skin is warm, dry.  No rash or ulcers Psychiatric: Mood and affect are normal    Labs on Admission: I have personally reviewed following labs and imaging studies  CBC: Recent Labs  Lab 03/09/21 1156  WBC 10.6*  HGB 12.4  HCT 37.7  MCV 90.8  PLT 220   Basic Metabolic Panel: Recent Labs  Lab 03/09/21 1156  NA 141  K 4.1  CL 104  CO2 25  GLUCOSE 102*  BUN 23  CREATININE 0.59  CALCIUM 9.1   GFR: Estimated Creatinine Clearance: 48.1 mL/min (by C-G formula based on SCr of 0.59 mg/dL). Liver Function Tests: No results for input(s): AST, ALT, ALKPHOS, BILITOT, PROT, ALBUMIN in the last 168 hours. No results for input(s): LIPASE, AMYLASE in the last 168 hours. No results for input(s): AMMONIA in the last 168  hours. Coagulation Profile: No results for input(s): INR, PROTIME in the last 168 hours. Cardiac Enzymes: No results for input(s): CKTOTAL, CKMB, CKMBINDEX, TROPONINI in the last 168 hours. BNP (last 3 results) No results for input(s): PROBNP in the last 8760 hours. HbA1C: No results for input(s): HGBA1C in the last 72 hours. CBG: No results for input(s): GLUCAP in the last 168 hours. Lipid Profile: No results for input(s): CHOL, HDL, LDLCALC, TRIG, CHOLHDL, LDLDIRECT in the last 72 hours. Thyroid Function Tests: No results for input(s): TSH, T4TOTAL, FREET4, T3FREE, THYROIDAB in the last 72 hours. Anemia Panel: No results for input(s): VITAMINB12, FOLATE, FERRITIN, TIBC, IRON, RETICCTPCT in the last 72 hours. Urine analysis:    Component Value Date/Time   COLORURINE YELLOW (A) 03/09/2021 1301   APPEARANCEUR CLOUDY (A) 03/09/2021 1301   APPEARANCEUR Clear 06/16/2020 1351   LABSPEC 1.016 03/09/2021 1301   PHURINE 7.0 03/09/2021 1301   GLUCOSEU NEGATIVE 03/09/2021 1301   HGBUR NEGATIVE 03/09/2021 1301   BILIRUBINUR NEGATIVE 03/09/2021 1301   BILIRUBINUR Negative 06/16/2020 1351   KETONESUR NEGATIVE 03/09/2021 1301   PROTEINUR 30 (A) 03/09/2021 1301   NITRITE POSITIVE (A) 03/09/2021 1301   LEUKOCYTESUR LARGE (A) 03/09/2021 1301    Radiological Exams on Admission: DG Chest 1 View  Result Date: 03/09/2021 CLINICAL DATA:  Fall.  Weakness EXAM: CHEST  1 VIEW COMPARISON:  09/02/2020 FINDINGS: Mild cardiac enlargement without heart failure. Lungs are clear without infiltrate. Possible small left effusion. Hiatal hernia. Atherosclerotic aorta. No displaced rib fracture. IMPRESSION: Small left pleural effusion and hiatal hernia. Electronically Signed   By: Marlan Palau M.D.   On: 03/09/2021 14:29   DG Pelvis 1-2 Views  Result Date: 03/09/2021 CLINICAL DATA:  Fall.  Left hip pain EXAM: PELVIS - 1-2 VIEW COMPARISON:  None. FINDINGS: No fracture identified. Both hips in internal  location limiting evaluation. The patient has further hip pain, consider follow-up dedicated hip views for further evaluation. IMPRESSION: No fracture identified. Electronically Signed   By: Marlan Palau M.D.   On: 03/09/2021 14:37   DG Knee Complete 4 Views Left  Result Date: 03/09/2021 CLINICAL DATA:  Fall  EXAM: LEFT KNEE - COMPLETE 4+ VIEW COMPARISON:  None. FINDINGS: Tricompartmental degenerative change with joint space narrowing and spurring. Moderate to advanced degenerative change. Negative for fracture.  Small joint effusion. IMPRESSION: Tricompartmental degenerative change.  Negative for fracture. Electronically Signed   By: Marlan Palau M.D.   On: 03/09/2021 14:36     Assessment/Plan Principal Problem:   Acute metabolic encephalopathy Active Problems:   Essential hypertension   Depression   Hypothyroidism, unspecified   Osteoarthritis of knee   Acute lower UTI      Acute metabolic encephalopathy Secondary to UTI Expect improvement in patient's mental status following resolution of acute illness.    UTI Patient with a 2-day history of frequency and dysuria Prior urine culture yielded E. coli sensitive to cephalosporins Place patient on Rocephin 1 g IV daily to complete a 3 to 5-day course of therapy.    Hypothyroidism Continue Synthroid    Mild cognitive deficits Continue Aricept    GERD Continue PPI  DVT prophylaxis: Lovenox  Code Status: DO NOT RESUSCITATE Family Communication: Greater than 50% of time was spent discussing patient's condition and plan with patient at the bedside.  All questions and concerns have been addressed.  She verbalizes understanding and agrees with the plan.  CODE STATUS was discussed and she is a DO NOT RESUSCITATE. Disposition Plan: Back to previous home environment Consults called: Physical therapy Status: At the time of admission, it appears that the appropriate admission status for this patient is inpatient. This is  judged to be reasonable and necessary in order to provide the required intensity of service to ensure the patient's safety given the presenting symptoms, physical exam findings, and initial radiographic and laboratory data in the context of their comorbid conditions. Patient requires inpatient status due to high intensity of service, high risk for further deterioration and high frequency of surveillance required.    Lucile Shutters MD [facility]     03/09/2021, 3:34 PM

## 2021-03-09 NOTE — ED Triage Notes (Addendum)
Pt in after mechanical fall d/t L knee pain and weakness - inability to stand this am. Denies any dizziness or injury per EMS, just knee pain that caused the fall today. No thinners, BP 132/51

## 2021-03-09 NOTE — ED Notes (Signed)
Pillow provided for patient.   Patient assisted slowly to and from bathroom with 2-person assist.   Urine specimen collected and sent to lab.

## 2021-03-09 NOTE — ED Notes (Signed)
Zac RN aware of assigned bed

## 2021-03-09 NOTE — ED Notes (Signed)
This tech collected full rainbow (lav, light green, and blue top). EKG performed.

## 2021-03-09 NOTE — ED Notes (Signed)
Applied Purewick to patient.   Provided patient warm blankets

## 2021-03-09 NOTE — ED Notes (Signed)
Pt reporting dizziness in triage, [phone]

## 2021-03-10 DIAGNOSIS — G9341 Metabolic encephalopathy: Secondary | ICD-10-CM | POA: Diagnosis not present

## 2021-03-10 LAB — FOLATE: Folate: 32 ng/mL (ref >5.9)

## 2021-03-10 LAB — CBC
HCT: 33.6 % — ABNORMAL LOW (ref 36.0–46.0)
Hemoglobin: 10.9 g/dL — ABNORMAL LOW (ref 12.0–15.0)
MCH: 29.7 pg (ref 26.0–34.0)
MCHC: 32.4 g/dL (ref 30.0–36.0)
MCV: 91.6 fL (ref 80.0–100.0)
Platelets: 191 10*3/uL (ref 150–400)
RBC: 3.67 MIL/uL — ABNORMAL LOW (ref 3.87–5.11)
RDW: 13.5 % (ref 11.5–15.5)
WBC: 9.5 10*3/uL (ref 4.0–10.5)
nRBC: 0 % (ref 0.0–0.2)

## 2021-03-10 LAB — IRON AND TIBC
Iron: 27 ug/dL — ABNORMAL LOW (ref 28–170)
Saturation Ratios: 14 % (ref 10.4–31.8)
TIBC: 196 ug/dL — ABNORMAL LOW (ref 250–450)
UIBC: 169 ug/dL

## 2021-03-10 LAB — BASIC METABOLIC PANEL
Anion gap: 4 — ABNORMAL LOW (ref 5–15)
BUN: 23 mg/dL (ref 8–23)
CO2: 26 mmol/L (ref 22–32)
Calcium: 8.4 mg/dL — ABNORMAL LOW (ref 8.9–10.3)
Chloride: 109 mmol/L (ref 98–111)
Creatinine, Ser: 0.67 mg/dL (ref 0.44–1.00)
GFR, Estimated: >60 mL/min (ref >60)
Glucose, Bld: 86 mg/dL (ref 70–99)
Potassium: 3.8 mmol/L (ref 3.5–5.1)
Sodium: 139 mmol/L (ref 135–145)

## 2021-03-10 LAB — VITAMIN B12: Vitamin B-12: 351 pg/mL (ref 180–914)

## 2021-03-10 LAB — VITAMIN D 25 HYDROXY (VIT D DEFICIENCY, FRACTURES): Vit D, 25-Hydroxy: 60.71 ng/mL (ref 30–100)

## 2021-03-10 MED ORDER — BISACODYL 5 MG PO TBEC
10.0000 mg | DELAYED_RELEASE_TABLET | Freq: Every day | ORAL | Status: DC | PRN
  Administered 2021-03-12: 10 mg via ORAL
  Filled 2021-03-10: qty 2

## 2021-03-10 MED ORDER — MELATONIN 5 MG PO TABS
5.0000 mg | ORAL_TABLET | Freq: Every day | ORAL | Status: DC
  Administered 2021-03-10 – 2021-03-11 (×3): 5 mg via ORAL
  Filled 2021-03-10 (×4): qty 1

## 2021-03-10 MED ORDER — BISACODYL 10 MG RE SUPP
10.0000 mg | Freq: Every day | RECTAL | Status: DC | PRN
  Administered 2021-03-10: 16:00:00 10 mg via RECTAL
  Filled 2021-03-10: qty 1

## 2021-03-10 MED ORDER — POLYSACCHARIDE IRON COMPLEX 150 MG PO CAPS
150.0000 mg | ORAL_CAPSULE | Freq: Every day | ORAL | Status: DC
  Administered 2021-03-10 – 2021-03-12 (×3): 150 mg via ORAL
  Filled 2021-03-10 (×3): qty 1

## 2021-03-10 MED ORDER — POLYETHYLENE GLYCOL 3350 17 G PO PACK
17.0000 g | PACK | Freq: Every day | ORAL | Status: DC
  Administered 2021-03-10 – 2021-03-12 (×3): 17 g via ORAL
  Filled 2021-03-10 (×3): qty 1

## 2021-03-10 NOTE — Evaluation (Signed)
Occupational Therapy Evaluation Patient Details Name: Abigail Strong MRN: [id number] DOB: 19-Feb-1930 Today's Date: 03/10/2021    History of Present Illness Pt is an 87 y.o. female presenting to hospital 7/21 s/p mechanical fall with worsening L knee pain; also c/o dysuria and frequency.  Pt admitted with acute metabolic encephalopathy secondary to UTI.  PMH includes chronic B knee pain, insomnia, HLD, memory loss, R shoulder pain, L rotator cuff disorder, bruit of R carotid artery, lower leg edema, hip pain, partial SBO, COPD, PNA, colon surgery.   Clinical Impression   Abigail Strong was seen for OT evaluation this date. Prior to hospital admission, pt was modified independent for most BADL management. She endorses sponge bathing at her sink (she is unable to step over into her tub), dressing herself without assistance, and using a 4WW for functional mobility around the home. Pt endorses falls history in past 6 months including most recent fall where she slid out of her lift recliner after sitting in it in the elevated position. Pt lives with her son in a 1 level home with 1 STE. Currently pt demonstrates impairments as described below (See OT problem list) which functionally limit her ability to perform ADL/self-care tasks. Pt currently requires MIN A for functional mobility and close supervision for LB ADL management. Pt would benefit from skilled OT services to address noted impairments and functional limitations (see below for any additional details) in order to maximize safety and independence while minimizing falls risk and caregiver burden. Upon hospital discharge, recommend HHOT to maximize pt safety and return to functional independence during meaningful occupations of daily life.     Follow Up Recommendations  Home health OT    Equipment Recommendations  Tub/shower bench;Toilet rise with handles    Recommendations for Other Services       Precautions / Restrictions  Precautions Precautions: Fall Restrictions Weight Bearing Restrictions: No      Mobility Bed Mobility Overal bed mobility: Needs Assistance Bed Mobility: Supine to Sit     Supine to sit: Supervision;HOB elevated     General bed mobility comments: increased effort/time to perform on own    Transfers Overall transfer level: Needs assistance Equipment used: Rolling walker (2 wheeled) Transfers: Sit to/from Stand Sit to Stand: Min assist;+2 safety/equipment;Supervision         General transfer comment: minimal assist to stand from bed up to RW; fairly controlled descent sitting down in recliner    Balance Overall balance assessment: Needs assistance Sitting-balance support: No upper extremity supported;Feet supported Sitting balance-Leahy Scale: Good Sitting balance - Comments: steady sitting reaching within BOS, exaggerated posterior lean appreciated during weight shift for functional activities. Cueing for safety provided.   Standing balance support: Single extremity supported Standing balance-Leahy Scale: Fair Standing balance comment: pt steady with at least single UE support                           ADL either performed or assessed with clinical judgement   ADL Overall ADL's : Needs assistance/impaired                                       General ADL Comments: Pt functionally limited by cardiopulmonary status, generalized weakness, decreased activity tolerance, and decreased safety awareness. She requires close supervision to don bilat hospital socks while sitting EOB this date, MIN A-Supervision for functional mobility/STS  t/fs, and consistent cueing for safety during functional tasks.     Vision Patient Visual Report: No change from baseline       Perception     Praxis      Pertinent Vitals/Pain Pain Assessment: No/denies pain     Hand Dominance     Extremity/Trunk Assessment Upper Extremity Assessment Upper Extremity  Assessment: Generalized weakness   Lower Extremity Assessment Lower Extremity Assessment: Generalized weakness   Cervical / Trunk Assessment Cervical / Trunk Assessment: Other exceptions Cervical / Trunk Exceptions: forward head/shoulders   Communication Communication Communication: No difficulties   Cognition Arousal/Alertness: Awake/alert Behavior During Therapy: WFL for tasks assessed/performed Overall Cognitive Status: Within Functional Limits for tasks assessed                                 General Comments: Pleasant, conversational, A&Ox4 during session. Mildly HOH.   General Comments  VSS t/o session. Pt on RA, SO2 remains >/= 91% t/o session.    Exercises Other Exercises Other Exercises: Pt educated on role of OT in acute setting, safe use of AE/DME for ADL management with considerations for use of TTB upon DC home, bladder management strategies to minimize risk of future UTI, and routines modifications to support safety and functional indep upon hospital DC. OT facilitates LB dressing task during session.   Shoulder Instructions      Home Living Family/patient expects to be discharged to:: Private residence Living Arrangements: Children (Son lives with her.) Available Help at Discharge: Family;Available PRN/intermittently Type of Home: House Home Access: Stairs to enter [employer] of Steps: 1 plus 1/2 step to enter Entrance Stairs-Rails: None Home Layout: One level     Bathroom Shower/Tub: Chief Strategy Officer: Standard     Home Equipment: Environmental consultant - 2 wheels;Walker - 4 wheels;Cane - single point;Bedside commode   Additional Comments: has lift chair      Prior Functioning/Environment Level of Independence: Needs assistance  Gait / Transfers Assistance Needed: Ambulates with rollator within home and RW outside of home; requires assist navigating steps in/out of home ADL's / Homemaking Assistance Needed: Keeps BSC next  to bed at night d/t frequent toileting needs, sponge bathes at sink as she is unable to step over edge of tub.   Comments: H/o falls; son is in/out of home (does not work)        OT Problem List: Decreased strength;Decreased coordination;Decreased activity tolerance;Decreased safety awareness;Impaired balance (sitting and/or standing);Decreased knowledge of use of DME or AE      OT Treatment/Interventions: Self-care/ADL training;Therapeutic exercise;Therapeutic activities;DME and/or AE instruction;Patient/family education;Balance training;Energy conservation    OT Goals(Current goals can be found in the care plan section) Acute Rehab OT Goals Patient Stated Goal: to improve strength and mobility OT Goal Formulation: With patient Time For Goal Achievement: 03/24/21 Potential to Achieve Goals: Good  OT Frequency: Min 1X/week   Barriers to D/C:            Co-evaluation   Reason for Co-Treatment: For patient/therapist safety;To address functional/ADL transfers PT goals addressed during session: Mobility/safety with mobility OT goals addressed during session: ADL's and self-care;Proper use of Adaptive equipment and DME      AM-PAC OT "6 Clicks" Daily Activity     Outcome Measure Help from another person eating meals?: None Help from another person taking care of personal grooming?: A Little Help from another person toileting, which includes using toliet, bedpan,  or urinal?: A Little Help from another person bathing (including washing, rinsing, drying)?: A Little Help from another person to put on and taking off regular upper body clothing?: A Little Help from another person to put on and taking off regular lower body clothing?: A Little 6 Click Score: 19   End of Session Equipment Utilized During Treatment: Gait belt;Rolling walker  Activity Tolerance: Patient tolerated treatment well Patient left: in chair;with call bell/phone within reach;with chair alarm set  OT Visit  Diagnosis: Other abnormalities of gait and mobility (R26.89);History of falling (Z91.81);Muscle weakness (generalized) (M62.81)                Time: 7829-5621 OT Time Calculation (min): 35 min Charges:  OT General Charges $OT Visit: 1 Visit OT Evaluation $OT Eval Moderate Complexity: 1 Mod OT Treatments $Self Care/Home Management : 8-22 mins  Rockney Ghee, M.S., OTR/L Ascom: [phone] 03/10/21, 1:54 PM

## 2021-03-10 NOTE — TOC Progression Note (Signed)
Transition of Care [facility]) - Progression Note    Patient Details  Name: Abigail Strong MRN: [id number] Date of Birth: Aug 27, 1929  Transition of Care [facility]) CM/SW [place], RN Phone Number: 03/10/2021, 1:49 PM  Clinical Narrative:   RNCM in to see patient, who states that she has limited help at home.  Patient lives with her son, who can assist on occasion but not with ambulation.  Patient has no concerns with getting to appointments or getting medications.  Patient states that she takes her medications as directed.    Patient's daughter is willing to accept patient to stay with her.   Patient states she will think about it and let TOC know prior to the end of the day.    [facility] states they can provide PT/OT for patient at home.    TOC will follow to discharge, TOC contact information given to patient and daughter.         Expected Discharge Plan and Services                                                 Social Determinants of Health (SDOH) Interventions    Readmission Risk Interventions No flowsheet data found.

## 2021-03-10 NOTE — Progress Notes (Signed)
[facility] Progress Note  Patient: Abigail Strong    [id number]  DOA: 03/09/2021     Date of Service: the patient was seen and examined on 03/10/2021  Chief Complaint  Patient presents with   Fall   Knee Pain    left   Near Syncope   Brief hospital course: Patient is an 87 year old Caucasian female who was brought into the ER by EMS for evaluation after a fall at home secondary to weakness.  She has had urinary symptoms for about 2 days which she describes as dysuria and frequency and urinalysis shows pyuria. Prior urine culture yielded E. Coli. Patient received a dose of IV Rocephin in the ER and will be admitted to the hospital for further evaluation. Labs show sodium 141, potassium 4.1, chloride 104, bicarb 25, glucose 102, BUN 23, creatinine 0.59, calcium 9.0, white count 10.6, hemoglobin 12.4, hematocrit 37.7, MCV 90.8, RDW 13.3, platelet count 220 Respiratory viral panel is negative Urine analysis shows pyuria Chest x-ray reviewed by me shows clear lungs, small left pleural effusion. Left knee x-ray shows tricompartmental degenerative change.  Negative for fracture. X-ray of the pelvis is negative for fracture Twelve-lead EKG reviewed by me shows sinus rhythm with LVH.  Assessment and Plan: Principal Problem:   Acute metabolic encephalopathy Active Problems:   Essential hypertension   Depression   Hypothyroidism, unspecified   Osteoarthritis of knee   Acute lower UTI         Acute metabolic encephalopathy Secondary to UTI Expect improvement in patient's mental status following resolution of acute illness.       UTI Patient with a 2-day history of frequency and dysuria Prior urine culture yielded E. coli sensitive to cephalosporins Place patient on Rocephin 1 g IV daily to complete a 3 to 5-day course of therapy.       Hypothyroidism Continue Synthroid       Mild cognitive deficits Continue Aricept       GERD Continue PPI   Body mass  index is 26.67 kg/m.  Interventions:       Diet: Regular diet DVT Prophylaxis: Subcutaneous Lovenox   Advance goals of care discussion: DNR  Family Communication: family was present at bedside, at the time of interview.  The pt provided permission to discuss medical plan with the family. Opportunity was given to ask question and all questions were answered satisfactorily.   Disposition:  Pt is from Home, admitted with AMS, UTI And Fall, still has diff ambulation and high risk for fall, which precludes a safe discharge. Discharge to TBD HHPT vs SNF, when PT and OT eval will be done.  Subjective: No active overnight issues, patient was admitted yesterday due to altered mental status which seems to be resolved.  Patient is still having difficulty ambulation and had high risk for fall, awaiting for PT and OT eval.   Physical Exam: General:  alert oriented to time, place, and person.  Appear in no distress, affect appropriate Eyes: PERRLA ENT: Oral Mucosa Clear, moist  Neck: no JVD,  Cardiovascular: S1 and S2 Present, no Murmur,  Respiratory: good respiratory effort, Bilateral Air entry equal and Decreased, no Crackles, no wheezes Abdomen: Bowel Sound present, Soft and no tenderness,  Skin: no Rashes Extremities: no Pedal edema, no calf tenderness Neurologic: without any new focal findings Gait not checked due to patient safety concerns  Vitals:   03/10/21 0051 03/10/21 0513 03/10/21 0752 03/10/21 1135  BP: (!) 92/49 106/66 104/65 99/70  Pulse: 64  81 67 62  Resp: '20 20 20 18  '$ Temp: 98 F (36.7 C) 97.7 F (36.5 C) 97.6 F (36.4 C) 98.2 F (36.8 C)  TempSrc:  Oral    SpO2: 93% 95% 100% 97%  Weight:      Height:        Intake/Output Summary (Last 24 hours) at 03/10/2021 1257 Last data filed at 03/10/2021 1140 Gross per 24 hour  Intake 2522.95 ml  Output 600 ml  Net 1922.95 ml   Filed Weights   03/09/21 1152 03/09/21 1900  Weight: 71.2 kg 72.7 kg    Data  Reviewed: I have personally reviewed and interpreted daily labs, tele strips, imagings as discussed above. I reviewed all nursing notes, pharmacy notes, vitals, pertinent old records I have discussed plan of care as described above with RN and patient/family.  CBC: Recent Labs  Lab 03/09/21 1156 03/10/21 0434  WBC 10.6* 9.5  HGB 12.4 10.9*  HCT 37.7 33.6*  MCV 90.8 91.6  PLT 220 [id number]   Basic Metabolic Panel: Recent Labs  Lab 03/09/21 1156 03/10/21 0434  NA 141 139  K 4.1 3.8  CL 104 109  CO2 25 26  GLUCOSE 102* 86  BUN 23 23  CREATININE 0.59 0.67  CALCIUM 9.1 8.4*    Studies: DG Chest 1 View  Result Date: 03/09/2021 CLINICAL DATA:  Fall.  Weakness EXAM: CHEST  1 VIEW COMPARISON:  09/02/2020 FINDINGS: Mild cardiac enlargement without heart failure. Lungs are clear without infiltrate. Possible small left effusion. Hiatal hernia. Atherosclerotic aorta. No displaced rib fracture. IMPRESSION: Small left pleural effusion and hiatal hernia. Electronically Signed   By: Franchot Gallo M.D.   On: 03/09/2021 14:29   DG Pelvis 1-2 Views  Result Date: 03/09/2021 CLINICAL DATA:  Fall.  Left hip pain EXAM: PELVIS - 1-2 VIEW COMPARISON:  None. FINDINGS: No fracture identified. Both hips in internal location limiting evaluation. The patient has further hip pain, consider follow-up dedicated hip views for further evaluation. IMPRESSION: No fracture identified. Electronically Signed   By: Franchot Gallo M.D.   On: 03/09/2021 14:37   DG Knee Complete 4 Views Left  Result Date: 03/09/2021 CLINICAL DATA:  Fall EXAM: LEFT KNEE - COMPLETE 4+ VIEW COMPARISON:  None. FINDINGS: Tricompartmental degenerative change with joint space narrowing and spurring. Moderate to advanced degenerative change. Negative for fracture.  Small joint effusion. IMPRESSION: Tricompartmental degenerative change.  Negative for fracture. Electronically Signed   By: Franchot Gallo M.D.   On: 03/09/2021 14:36    Scheduled  Meds:  aspirin  81 mg Oral Daily   clopidogrel  75 mg Oral Daily   donepezil  5 mg Oral QHS   enoxaparin (LOVENOX) injection  40 mg Subcutaneous Q24H   gabapentin  100 mg Oral TID   iron polysaccharides  150 mg Oral Daily   levothyroxine  125 mcg Oral Daily   melatonin  5 mg Oral QHS   pantoprazole  40 mg Oral QHS   polyethylene glycol  17 g Oral Daily   pravastatin  40 mg Oral QHS   pregabalin  50 mg Oral BID   Continuous Infusions:  cefTRIAXone (ROCEPHIN)  IV 1 g (03/10/21 0903)   PRN Meds: acetaminophen, albuterol, bisacodyl, bisacodyl, ondansetron **OR** ondansetron (ZOFRAN) IV  Time spent: 35 minutes  Author: Val Riles. MD [facility] 03/10/2021 12:57 PM  To reach On-call, see care teams to locate the attending and reach out to them via [email or web address]. If 7PM-7AM, please contact night-coverage If  you still have difficulty reaching the attending provider, please page the [facility] (Director on Call) for [facility] on amion for assistance.

## 2021-03-10 NOTE — Evaluation (Signed)
Physical Therapy Evaluation Patient Details Name: Abigail Strong MRN: [id number] DOB: [birth year] Today's Date: 03/10/2021   History of Present Illness  Pt is an 87 y.o. female presenting to hospital 7/21 s/p mechanical fall with worsening L knee pain; also c/o dysuria and frequency.  Pt admitted with acute metabolic encephalopathy secondary to UTI.  PMH includes chronic B knee pain, insomnia, HLD, memory loss, R shoulder pain, L rotator cuff disorder, bruit of R carotid artery, lower leg edema, hip pain, partial SBO, COPD, PNA, colon surgery.  Clinical Impression  PT/OT co-evaluation performed.  Prior to hospital admission, pt was ambulatory with walker (rollator within home; RW outside of home); lives with her son in 1 level home with 1 1/2 steps to enter (no railing)--always has assist with stairs.  Currently pt is SBA semi-supine to sitting edge of bed; min assist (plus SBA for safety) with transfers using RW; and CGA (SBA for safety) ambulating 40 feet with RW.  Pt noted with antalgic gait (decreased stance time L LE) but pt reporting no pain during session.  Pt would benefit from skilled PT to address noted impairments and functional limitations (see below for any additional details).  Upon hospital discharge, pt would benefit from HHPT and initial 24/7 assist with functional mobility for safety (pt reports her son does not work and can assist).    Follow Up Recommendations Home health PT;Supervision/Assistance - 24 hour    Equipment Recommendations  Rolling walker with 5" wheels;3in1 (PT)    Recommendations for Other Services OT consult     Precautions / Restrictions Precautions Precautions: Fall Restrictions Weight Bearing Restrictions: No      Mobility  Bed Mobility Overal bed mobility: Needs Assistance Bed Mobility: Supine to Sit     Supine to sit: Supervision;HOB elevated     General bed mobility comments: increased effort/time to perform on own     Transfers Overall transfer level: Needs assistance Equipment used: Rolling walker (2 wheeled) Transfers: Sit to/from Stand Sit to Stand: Min assist;+2 safety/equipment;Supervision         General transfer comment: minimal assist to stand from bed up to RW; fairly controlled descent sitting down in recliner  Ambulation/Gait Ambulation/Gait assistance: Min guard;Supervision;+2 safety/equipment Gait Distance (Feet): 40 Feet Assistive device: Rolling walker (2 wheeled) Gait Pattern/deviations: Antalgic Gait velocity: decreased   General Gait Details: decreased stance time L LE; partial step through gait pattern; initially lifting R side of walker off floor x2 (CGA for safety) but then steady after that  Stairs            Wheelchair Mobility    Modified Rankin (Stroke Patients Only)       Balance Overall balance assessment: Needs assistance Sitting-balance support: No upper extremity supported;Feet supported Sitting balance-Leahy Scale: Good Sitting balance - Comments: steady sitting reaching within BOS   Standing balance support: Single extremity supported Standing balance-Leahy Scale: Fair Standing balance comment: pt steady with at least single UE support                             Pertinent Vitals/Pain Pain Assessment: No/denies pain O2 sats 91% at rest beginning of session but 93% or greater end of session (on room air). HR WFL during sessions activities.    Home Living Family/patient expects to be discharged to:: Private residence Living Arrangements: Children (pt's son) Available Help at Discharge: Family;Available PRN/intermittently Type of Home: House Home Access: Stairs to enter Entrance Stairs-Rails:  None Entrance Stairs-Number of Steps: 1 plus 1/2 step to enter Home Layout: One level Home Equipment: Walker - 2 wheels;Walker - 4 wheels;Cane - single point Additional Comments: has lift chair    Prior Function Level of Independence:  Needs assistance   Gait / Transfers Assistance Needed: Ambulates with rollator within home and RW outside of home; requires assist navigating steps in/out of home  ADL's / Homemaking Assistance Needed: Keeps BSC next to bed at night d/t frequent toileting needs  Comments: H/o falls; son is in/out of home (does not work)     Higher education careers adviser        Extremity/Trunk Assessment   Upper Extremity Assessment Upper Extremity Assessment: Defer to OT evaluation    Lower Extremity Assessment Lower Extremity Assessment: Generalized weakness    Cervical / Trunk Assessment Cervical / Trunk Assessment: Other exceptions Cervical / Trunk Exceptions: forward head/shoulders  Communication   Communication: No difficulties  Cognition Arousal/Alertness: Awake/alert Behavior During Therapy: WFL for tasks assessed/performed Overall Cognitive Status: Within Functional Limits for tasks assessed                                        General Comments  Nursing cleared pt for participation in physical therapy.  Pt agreeable to PT/OT session.    Exercises  Functional mobility   Assessment/Plan    PT Assessment Patient needs continued PT services  PT Problem List Decreased strength;Decreased activity tolerance;Decreased balance;Decreased mobility       PT Treatment Interventions DME instruction;Gait training;Stair training;Functional mobility training;Therapeutic activities;Therapeutic exercise;Balance training;Patient/family education    PT Goals (Current goals can be found in the Care Plan section)  Acute Rehab PT Goals Patient Stated Goal: to improve strength and mobility PT Goal Formulation: With patient Time For Goal Achievement: 03/24/21 Potential to Achieve Goals: Good    Frequency Min 2X/week   Barriers to discharge        Co-evaluation PT/OT/SLP Co-Evaluation/Treatment: Yes Reason for Co-Treatment: For patient/therapist safety;To address functional/ADL  transfers PT goals addressed during session: Mobility/safety with mobility OT goals addressed during session: ADL's and self-care       AM-PAC PT "6 Clicks" Mobility  Outcome Measure Help needed turning from your back to your side while in a flat bed without using bedrails?: None Help needed moving from lying on your back to sitting on the side of a flat bed without using bedrails?: A Little Help needed moving to and from a bed to a chair (including a wheelchair)?: A Little Help needed standing up from a chair using your arms (e.g., wheelchair or bedside chair)?: A Little Help needed to walk in hospital room?: A Little Help needed climbing 3-5 steps with a railing? : A Lot 6 Click Score: 18    End of Session Equipment Utilized During Treatment: Gait belt Activity Tolerance: Patient tolerated treatment well Patient left: in chair;with call bell/phone within reach;with chair alarm set;Other (comment) (OT present) Nurse Communication: Mobility status;Precautions PT Visit Diagnosis: Other abnormalities of gait and mobility (R26.89);Muscle weakness (generalized) (M62.81);History of falling (Z91.81)    Time: 1478-2956 PT Time Calculation (min) (ACUTE ONLY): 29 min   Charges:   PT Evaluation $PT Eval Low Complexity: 1 Low PT Treatments $Therapeutic Activity: 8-22 mins       Hendricks Limes, PT 03/10/21, 1:34 PM

## 2021-03-10 NOTE — Progress Notes (Signed)
Cpap order acknowledge. Patient endorses that she does sometimes use a mask at night. Patient has refused cpap at this time. Patient states she is unable to sleep anyway

## 2021-03-11 ENCOUNTER — Inpatient Hospital Stay
Admit: 2021-03-11 | Discharge: 2021-03-11 | Disposition: A | Discharge status: Home / Self Care | Payer: Medicare Other | Attending: Student | Admitting: Student

## 2021-03-11 DIAGNOSIS — G9341 Metabolic encephalopathy: Secondary | ICD-10-CM | POA: Diagnosis not present

## 2021-03-11 LAB — GLUCOSE, CAPILLARY: Glucose-Capillary: 114 mg/dL — ABNORMAL HIGH (ref 70–99)

## 2021-03-11 MED ORDER — VITAMIN B-12 100 MCG PO TABS: 100.0000 ug | ORAL_TABLET | Freq: Every day | ORAL | Status: DC

## 2021-03-11 MED ORDER — CYANOCOBALAMIN 1000 MCG/ML IJ SOLN
1000.0000 ug | Freq: Every day | INTRAMUSCULAR | Status: AC
  Administered 2021-03-11 – 2021-03-12 (×2): 1000 ug via INTRAMUSCULAR
  Filled 2021-03-11 (×2): qty 1

## 2021-03-11 MED ORDER — VITAMIN B-12 100 MCG PO TABS
100.0000 ug | ORAL_TABLET | Freq: Every day | ORAL | Status: DC
  Administered 2021-03-11: 100 ug via ORAL
  Filled 2021-03-11: qty 1

## 2021-03-11 MED ORDER — CYANOCOBALAMIN 1000 MCG/ML IJ SOLN: 1000.0000 ug | Freq: Once | INTRAMUSCULAR | Status: DC

## 2021-03-11 MED ORDER — SODIUM CHLORIDE 0.9 % IV BOLUS
500.0000 mL | Freq: Once | INTRAVENOUS | Status: AC
  Administered 2021-03-11: 500 mL via INTRAVENOUS

## 2021-03-11 NOTE — Progress Notes (Signed)
[facility] Progress Note  Patient: Abigail Strong    [id number]  DOA: 03/09/2021     Date of Service: the patient was seen and examined on 03/11/2021  Chief Complaint  Patient presents with   Fall   Knee Pain    left   Near Syncope   Brief hospital course: Patient is an 87 year old Caucasian female who was brought into the ER by EMS for evaluation after a fall at home secondary to weakness.  She has had urinary symptoms for about 2 days which she describes as dysuria and frequency and urinalysis shows pyuria. Prior urine culture yielded E. Coli. Patient received a dose of IV Rocephin in the ER and will be admitted to the hospital for further evaluation. Labs show sodium 141, potassium 4.1, chloride 104, bicarb 25, glucose 102, BUN 23, creatinine 0.59, calcium 9.0, white count 10.6, hemoglobin 12.4, hematocrit 37.7, MCV 90.8, RDW 13.3, platelet count 220 Respiratory viral panel is negative Urine analysis shows pyuria Chest x-ray reviewed by me shows clear lungs, small left pleural effusion. Left knee x-ray shows tricompartmental degenerative change.  Negative for fracture. X-ray of the pelvis is negative for fracture Twelve-lead EKG reviewed by me shows sinus rhythm with LVH.  Assessment and Plan: Principal Problem:   Acute metabolic encephalopathy Active Problems:   Essential hypertension   Depression   Hypothyroidism, unspecified   Osteoarthritis of knee   Acute lower UTI       Hypotension and bradycardia Heart rate tolerance and 40s at home as per patient 7/23 Blood pressure soft, NS IV fluid bolus given Monitor on telemetry, repeat EKG and follow 2D echocardiogram Cardiology consulted for further recommendation     Acute metabolic encephalopathy, resolved now  Secondary to UTI Expect improvement in patient's mental status following resolution of acute illness.       UTI Patient with a 2-day history of frequency and dysuria Prior urine culture  yielded E. coli sensitive to cephalosporins Place patient on Rocephin 1 g IV daily to complete a 3 to 5-day course of therapy. Urine culture growing GNR, complete report pending  Anemia of chronic disease, iron level 27, iron saturation 14%, at lower end.  Started oral iron supplement.  Vitamin B12 level 351, target >400, started vitamin B12 supplement. Follow with PCP repeat iron profile and vitamin B12 level after 3 months.   Hypothyroidism Continue Synthroid       Mild cognitive deficits Continue Aricept       GERD Continue PPI   Body mass index is 26.67 kg/m.  Interventions:       Diet: Regular diet DVT Prophylaxis: Subcutaneous Lovenox   Advance goals of care discussion: DNR  Family Communication: family was present at bedside, at the time of interview.  The pt provided permission to discuss medical plan with the family. Opportunity was given to ask question and all questions were answered satisfactorily.   Disposition:  Pt is from Home, admitted with AMS, UTI And Fall, still has diff ambulation and high risk for fall, which precludes a safe discharge. Discharge to [facility] vs SNF, awaiting for cardiology evaluation for bradycardia and then we will plan for disposition tomorrow or day after  Subjective: No active overnight issues, patient is awake and alert, encephalopathy has resolved, UTI is getting better, has mild dysuria.  Patient still has wobbly gait and more prone to fall, she does not think that she is safe to go home and still remains at risk of the fall.  Patient  was concerned about her heart rate being low in 40s and agreed with the cardiology consult. We will do above work-up for bradycardia and her blood pressure was low as well so IV fluid bolus was given. Patient denied chest pain or palpitations, no shortness of breath.    Physical Exam: General:  alert oriented to time, place, and person.  Appear in no distress, affect appropriate Eyes: PERRLA ENT:  Oral Mucosa Clear, moist  Neck: no JVD,  Cardiovascular: S1 and S2 Present, no Murmur,  Respiratory: good respiratory effort, Bilateral Air entry equal and Decreased, no Crackles, no wheezes Abdomen: Bowel Sound present, Soft and no tenderness,  Skin: no Rashes Extremities: no Pedal edema, no calf tenderness Neurologic: without any new focal findings Gait not checked due to patient safety concerns  Vitals:   03/10/21 2000 03/10/21 2352 03/11/21 0556 03/11/21 0857  BP: 134/70 (!) 115/59 (!) 92/58 106/62  Pulse: 62 (!) 41 66 64  Resp: '20  18 20  '$ Temp: 98.8 F (37.1 C) 100 F (37.8 C) 98.5 F (36.9 C) 98.9 F (37.2 C)  TempSrc:   Oral Oral  SpO2: 96% 97% 94% 94%  Weight:      Height:        Intake/Output Summary (Last 24 hours) at 03/11/2021 1159 Last data filed at 03/11/2021 1129 Gross per 24 hour  Intake 560 ml  Output 1750 ml  Net -1190 ml   Filed Weights   03/09/21 1152 03/09/21 1900  Weight: 71.2 kg 72.7 kg    Data Reviewed: I have personally reviewed and interpreted daily labs, tele strips, imagings as discussed above. I reviewed all nursing notes, pharmacy notes, vitals, pertinent old records I have discussed plan of care as described above with RN and patient/family.  CBC: Recent Labs  Lab 03/09/21 1156 03/10/21 0434  WBC 10.6* 9.5  HGB 12.4 10.9*  HCT 37.7 33.6*  MCV 90.8 91.6  PLT 220 [id number]   Basic Metabolic Panel: Recent Labs  Lab 03/09/21 1156 03/10/21 0434  NA 141 139  K 4.1 3.8  CL 104 109  CO2 25 26  GLUCOSE 102* 86  BUN 23 23  CREATININE 0.59 0.67  CALCIUM 9.1 8.4*    Studies: No results found.  Scheduled Meds:  aspirin  81 mg Oral Daily   clopidogrel  75 mg Oral Daily   donepezil  5 mg Oral QHS   enoxaparin (LOVENOX) injection  40 mg Subcutaneous Q24H   gabapentin  100 mg Oral TID   iron polysaccharides  150 mg Oral Daily   levothyroxine  125 mcg Oral Daily   melatonin  5 mg Oral QHS   pantoprazole  40 mg Oral QHS    polyethylene glycol  17 g Oral Daily   pravastatin  40 mg Oral QHS   pregabalin  50 mg Oral BID   vitamin B-12  100 mcg Oral Daily   Continuous Infusions:  cefTRIAXone (ROCEPHIN)  IV 1 g (03/11/21 0913)   PRN Meds: acetaminophen, albuterol, bisacodyl, bisacodyl, ondansetron **OR** ondansetron (ZOFRAN) IV  Time spent: 35 minutes  Author: Val Riles. MD [facility] 03/11/2021 11:59 AM  To reach On-call, see care teams to locate the attending and reach out to them via [email or web address]. If 7PM-7AM, please contact night-coverage If you still have difficulty reaching the attending provider, please page the [facility] (Director on Call) for [facility] on amion for assistance.

## 2021-03-12 DIAGNOSIS — G9341 Metabolic encephalopathy: Secondary | ICD-10-CM | POA: Diagnosis not present

## 2021-03-12 LAB — BASIC METABOLIC PANEL
Anion gap: 7 (ref 5–15)
BUN: 11 mg/dL (ref 8–23)
CO2: 29 mmol/L (ref 22–32)
Calcium: 8.7 mg/dL — ABNORMAL LOW (ref 8.9–10.3)
Chloride: 100 mmol/L (ref 98–111)
Creatinine, Ser: 0.79 mg/dL (ref 0.44–1.00)
GFR, Estimated: >60 mL/min (ref >60)
Glucose, Bld: 98 mg/dL (ref 70–99)
Potassium: 4 mmol/L (ref 3.5–5.1)
Sodium: 136 mmol/L (ref 135–145)

## 2021-03-12 LAB — ECHOCARDIOGRAM COMPLETE
AR max vel: 1.79 cm2
AV Area VTI: 1.44 cm2
AV Area mean vel: 1.72 cm2
AV Mean grad: 4 mmHg
AV Peak grad: 7.7 mmHg
Ao pk vel: 1.39 m/s
Area-P 1/2: 2.48 cm2
Height: 65 in
S' Lateral: 1.85 cm
Weight: 2564.39 oz

## 2021-03-12 LAB — CBC
HCT: 34.3 % — ABNORMAL LOW (ref 36.0–46.0)
Hemoglobin: 11.4 g/dL — ABNORMAL LOW (ref 12.0–15.0)
MCH: 29.9 pg (ref 26.0–34.0)
MCHC: 33.2 g/dL (ref 30.0–36.0)
MCV: 90 fL (ref 80.0–100.0)
Platelets: 213 10*3/uL (ref 150–400)
RBC: 3.81 MIL/uL — ABNORMAL LOW (ref 3.87–5.11)
RDW: 13.2 % (ref 11.5–15.5)
WBC: 9.8 10*3/uL (ref 4.0–10.5)
nRBC: 0 % (ref 0.0–0.2)

## 2021-03-12 LAB — URINE CULTURE: Culture: >=100000 — AB

## 2021-03-12 LAB — PHOSPHORUS: Phosphorus: 2.9 mg/dL (ref 2.5–4.6)

## 2021-03-12 LAB — MAGNESIUM: Magnesium: 2 mg/dL (ref 1.7–2.4)

## 2021-03-12 MED ORDER — POLYETHYLENE GLYCOL 3350 17 G PO PACK
17.0000 g | PACK | Freq: Every day | ORAL | 0 refills | Status: DC
Start: 2021-03-13 — End: 2021-05-11

## 2021-03-12 MED ORDER — FOSFOMYCIN TROMETHAMINE 3 G PO PACK
3.0000 g | PACK | Freq: Once | ORAL | Status: AC
  Administered 2021-03-12: 16:00:00 3 g via ORAL
  Filled 2021-03-12: qty 3

## 2021-03-12 MED ORDER — MELATONIN 5 MG PO TABS: 5.0000 mg | ORAL_TABLET | Freq: Every day | ORAL | 2 refills | Status: AC

## 2021-03-12 MED ORDER — BISACODYL 5 MG PO TBEC: 10.0000 mg | DELAYED_RELEASE_TABLET | Freq: Every day | ORAL | 0 refills | Status: DC | PRN

## 2021-03-12 MED ORDER — POLYSACCHARIDE IRON COMPLEX 150 MG PO CAPS: 150.0000 mg | ORAL_CAPSULE | Freq: Every day | ORAL | 2 refills | Status: DC

## 2021-03-12 MED ORDER — OMEPRAZOLE 40 MG PO CPDR: 40.0000 mg | DELAYED_RELEASE_CAPSULE | Freq: Every day | ORAL | 2 refills | Status: DC

## 2021-03-12 MED ORDER — CYANOCOBALAMIN 100 MCG PO TABS: 100.0000 ug | ORAL_TABLET | Freq: Every day | ORAL | 2 refills | Status: AC

## 2021-03-12 NOTE — Progress Notes (Signed)
Physical Therapy Treatment Patient Details Name: Abigail Strong MRN: [id number] DOB: 27-Sep-1929 Today's Date: 03/12/2021    History of Present Illness Pt is an 87 y.o. female presenting to hospital 7/21 s/p mechanical fall with worsening L knee pain; also c/o dysuria and frequency.  Pt admitted with acute metabolic encephalopathy secondary to UTI.  PMH includes chronic B knee pain, insomnia, HLD, memory loss, R shoulder pain, L rotator cuff disorder, bruit of R carotid artery, lower leg edema, hip pain, partial SBO, COPD, PNA, colon surgery.    PT Comments    Patient seen at the request of the team to assist with discharge planning. Patient lives with her son but he will be limited/unable to provide increased assistance at home per patient report. She reports she had assistance prior to coming to the hospital with things like getting in and out of the car and going up/down a step, but was otherwise managing on her own.   Patient has progressed with independence with mobility since PT evaluation and has been out of bed, ambulating to bathroom with staff. Patient was able to get out of bed with head of bed flat with supervision. Min guard assistance provided with transfers with occasional cues for safety. Patient with increased hallway ambulation distance with stand by assistance to Min guard provided for safety. Patient has mild unsteadiness with walking when distracted that is self corrected. Sp02 94% on room air after walking with heart rate of 84bpm.   Patient reports she will essentially be discharging home alone as her son will be unable to help at discharge. Patient will need some assistance/supervision for higher level ADL activity, meal preparation, home management. If family unable to provide assistance, SNF is recommended.    Follow Up Recommendations  Supervision for mobility/OOB (SNF if family unable to assist)    Equipment Recommendations  Rolling walker with 5" wheels     Recommendations for Other Services       Precautions / Restrictions Precautions Precautions: Fall Restrictions Weight Bearing Restrictions: No    Mobility  Bed Mobility Overal bed mobility: Needs Assistance Bed Mobility: Supine to Sit     Supine to sit: Supervision     General bed mobility comments: head of bed flat and no use of bed rails. extra time required to complete tasks with no physical assistance needed.    Transfers Overall transfer level: Needs assistance Equipment used: Rolling walker (2 wheeled) Transfers: Sit to/from Stand Sit to Stand: Min guard         General transfer comment: occasional cues for safety. no physical lifting required to stand with the bed in the lowest height position  Ambulation/Gait Ambulation/Gait assistance: Min guard Gait Distance (Feet): 100 Feet Assistive device: Rolling walker (2 wheeled) Gait Pattern/deviations: Antalgic;Trunk flexed Gait velocity: decreased   General Gait Details: occasional verbal cues to keep rolling walker closer to base of support. patient has intermittent unsteadiness when she is distracted that she self corrects with Min guard assistance, otherwise stany by assistance for safety. Sp02 94% on room and and heart rate 84bpm after walking.   Stairs             Wheelchair Mobility    Modified Rankin (Stroke Patients Only)       Balance Overall balance assessment: Needs assistance Sitting-balance support: No upper extremity supported;Feet supported Sitting balance-Leahy Scale: Good     Standing balance support: Bilateral upper extremity supported Standing balance-Leahy Scale: Fair Standing balance comment: patient relying on rolling walker  for support in standing                            Cognition Arousal/Alertness: Awake/alert Behavior During Therapy: WFL for tasks assessed/performed Overall Cognitive Status: Within Functional Limits for tasks assessed                                         Exercises      General Comments        Pertinent Vitals/Pain Pain Assessment: No/denies pain    Home Living                      Prior Function            PT Goals (current goals can now be found in the care plan section) Acute Rehab PT Goals Patient Stated Goal: to improve strength and mobility PT Goal Formulation: With patient Time For Goal Achievement: 03/24/21 Potential to Achieve Goals: Good Progress towards PT goals: Progressing toward goals    Frequency    Min 2X/week      PT Plan Discharge plan needs to be updated    Co-evaluation              AM-PAC PT "6 Clicks" Mobility   Outcome Measure  Help needed turning from your back to your side while in a flat bed without using bedrails?: None Help needed moving from lying on your back to sitting on the side of a flat bed without using bedrails?: A Little Help needed moving to and from a bed to a chair (including a wheelchair)?: A Little Help needed standing up from a chair using your arms (e.g., wheelchair or bedside chair)?: A Little Help needed to walk in hospital room?: A Little Help needed climbing 3-5 steps with a railing? : A Lot 6 Click Score: 18    End of Session Equipment Utilized During Treatment: Gait belt Activity Tolerance: Patient tolerated treatment well Patient left: in bed;with call bell/phone within reach;with chair alarm set Nurse Communication: Mobility status PT Visit Diagnosis: Other abnormalities of gait and mobility (R26.89);Muscle weakness (generalized) (M62.81);History of falling (Z91.81)     Time: 1133-1208 PT Time Calculation (min) (ACUTE ONLY): 35 min  Charges:  $Gait Training: 8-22 mins $Therapeutic Activity: 8-22 mins                     Abigail Strong, PT, MPT    Abigail Strong 03/12/2021, 12:21 PM

## 2021-03-12 NOTE — Discharge Summary (Signed)
[facility] Discharge Summary   Patient: Abigail Strong [id number]:[id number]  PCP: Corky Downs, MD  Date of admission: 03/09/2021   Date of discharge:  03/12/2021     Discharge Diagnoses:   Principal Problem:   Acute metabolic encephalopathy Active Problems:   Essential hypertension   Depression   Hypothyroidism, unspecified   Osteoarthritis of knee   Acute lower UTI   Admitted From: Home Disposition:  Home w/HHPT and social worker  Recommendations for Outpatient Follow-up:  PCP: in 1 wk, monitor BP and HR Cardiologist on thursday  Follow up LABS/TEST:  cardiac monitor for bradycardia   Diet recommendation: Cardiac diet  Activity: The patient is advised to gradually reintroduce usual activities, as tolerated  Discharge Condition: stable  Code Status: DNR   History of present illness: As per the H and P dictated on admission Hospital Course:  Patient is an 87 year old Caucasian female who was brought into the ER by EMS for evaluation after a fall at home secondary to weakness.  She has had urinary symptoms for about 2 days which she describes as dysuria and frequency and urinalysis shows pyuria. Prior urine culture yielded E. Coli. Patient received a dose of IV Rocephin in the ER and will be admitted to the hospital for further evaluation. Labs show sodium 141, potassium 4.1, chloride 104, bicarb 25, glucose 102, BUN 23, creatinine 0.59, calcium 9.0, white count 10.6, hemoglobin 12.4, hematocrit 37.7, MCV 90.8, RDW 13.3, platelet count 220, Respiratory viral panel is negative. Urine analysis shows pyuria Chest x-ray reviewed by me shows clear lungs, small left pleural effusion. Left knee x-ray shows tricompartmental degenerative change.  Negative for fracture. X-ray of the pelvis is negative for fracture Twelve-lead EKG reviewed by me shows sinus rhythm with LVH. Assessment and Plan: Principal Problem:   Acute metabolic encephalopathy Active Problems:   Essential  hypertension   Depression   Hypothyroidism, unspecified   Osteoarthritis of knee   Acute lower UTI    # Hypotension and bradycardia, Heart rate fluctuating and HR 40s at home as per patient.  7/23 Blood pressure soft, NS IV fluid bolus given.  Currently blood pressure stable, patient was monitored on telemetry overnight, no significant events, EKG did not show any significant evidence of blockages per cardiology, TTE shows LVEF 65%, grade 1 diastolic dysfunction.  No any other significant abnormalities.  Cardiology recommended follow-up as an outpatient for cardiac monitor and possible pacemaker if persistent low heart rate.  Patient was advised to monitor BP and heart rate at home and follow with PCP and cardiology as an outpatient # Acute metabolic encephalopathy, resolved now , Secondary to UTI.  Patient is back to her baseline.  # UTI due to E. coli ESBL.  Patient was on IV Rocephin, switched to fosfomycin 3 g p.o. x1 dose before discharge as per pharmacy recommendation.  Patient was advised to follow with PCP if persistent symptoms, fosfomycin dose can be repeated 1 time as an outpatient as well. # Anemia of chronic disease, iron level 27, iron saturation 14%, at lower end.  Started oral iron supplement.  Vitamin B12 level 351, target >400, started vitamin B12 supplement. Follow with PCP repeat iron profile and vitamin B12 level after 3 months. # Hypothyroidismm, Continue Synthroid # Mild cognitive deficits, Continue Aricept # GERD, Continue PPI Body mass index is 26.67 kg/m.  Nutrition Interventions: - Patient was instructed, not to drive, operate heavy machinery, perform activities at heights, swimming or participation in water activities or provide baby sitting  services while on Pain, Sleep and Anxiety Medications; until her outpatient Physician has advised to do so again.  - Also recommended to not to take more than prescribed Pain, Sleep and Anxiety Medications.  Patient was seen by  physical therapy, who recommended Home health, which was arranged. On the day of the discharge the patient's vitals were stable, and no other acute medical condition were reported by patient. the patient was felt safe to be discharge at Home with Home health.  Consultants: Cardiologist Procedures: None  Discharge Exam: General: Appear in mild distress, no Rash; Oral Mucosa Clear, moist. Cardiovascular: S1 and S2 Present, no Murmur, Respiratory: normal respiratory effort, Bilateral Air entry present and no Crackles, no wheezes Abdomen: Bowel Sound present, Soft and no tenderness, no hernia Extremities: no Pedal edema, no calf tenderness Neurology: alert and oriented to time, place, and person affect appropriate.  Filed Weights   03/09/21 1152 03/09/21 1900  Weight: 71.2 kg 72.7 kg   Vitals:   03/12/21 0741 03/12/21 1137  BP: 102/60 108/66  Pulse: 72 (!) 56  Resp: 18 18  Temp: 98.4 F (36.9 C) 97.7 F (36.5 C)  SpO2: 90% 94%    DISCHARGE MEDICATION: Allergies as of 03/12/2021       Reactions   Gabapentin Other (See Comments)   Dizziness    Sulfa Antibiotics Other (See Comments)   Sulfonamide Derivatives         Medication List     STOP taking these medications    ciprofloxacin 0.3 % ophthalmic solution Commonly known as: CILOXAN   Gentak 0.3 % ophthalmic ointment Generic drug: gentamicin   methylPREDNISolone 4 MG Tbpk tablet Commonly known as: MEDROL DOSEPAK       TAKE these medications    acetaminophen 500 MG tablet Commonly known as: TYLENOL Take 1,000 mg by mouth every 6 (six) hours as needed for mild pain or moderate pain.   aspirin 81 MG chewable tablet Chew 81 mg by mouth daily.   bisacodyl 5 MG EC tablet Commonly known as: DULCOLAX Take 2 tablets (10 mg total) by mouth daily as needed for moderate constipation.   cyanocobalamin 100 MCG tablet Take 1 tablet (100 mcg total) by mouth daily. Start taking on: March 13, 2021   donepezil 5 MG  disintegrating tablet Commonly known as: ARICEPT ODT Take 1 tablet (5 mg total) by mouth at bedtime.   iron polysaccharides 150 MG capsule Commonly known as: NIFEREX Take 1 capsule (150 mg total) by mouth daily. Start taking on: March 13, 2021   melatonin 5 MG Tabs Take 1 tablet (5 mg total) by mouth at bedtime.   omeprazole 40 MG capsule Commonly known as: PRILOSEC Take 1 capsule (40 mg total) by mouth daily. What changed: when to take this   polyethylene glycol 17 g packet Commonly known as: MIRALAX / GLYCOLAX Take 17 g by mouth daily. Start taking on: March 13, 2021   pregabalin 50 MG capsule Commonly known as: Lyrica Take 1 capsule (50 mg total) by mouth 2 (two) times daily.       ASK your doctor about these medications    albuterol (2.5 MG/3ML) 0.083% nebulizer solution Commonly known as: PROVENTIL USE 1 VIAL IN NEBULIZER ONCE DAILY AS NEEDED   clopidogrel 75 MG tablet Commonly known as: PLAVIX Take 1 tablet by mouth once daily   furosemide 40 MG tablet Commonly known as: LASIX Take 1 tablet (40 mg total) by mouth daily.   gabapentin 100 MG capsule Commonly  known as: NEURONTIN TAKE 1 CAPSULE BY MOUTH THREE TIMES DAILY   levothyroxine 125 MCG tablet Commonly known as: SYNTHROID Take 1 tablet by mouth once daily   meloxicam 7.5 MG tablet Commonly known as: MOBIC Take 1 tablet by mouth once daily   pravastatin 40 MG tablet Commonly known as: PRAVACHOL TAKE 1 TABLET BY MOUTH AT BEDTIME               Durable Medical Equipment  (From admission, onward)           Start     Ordered   03/11/21 0802  For home use only DME 3 n 1  Once        03/11/21 0801   03/11/21 0802  For home use only DME Walker rolling  Once       Question Answer Comment  Walker: With 5 Inch Wheels   Patient needs a walker to treat with the following condition SOB (shortness of breath)      03/11/21 0801           Allergies  Allergen Reactions   Gabapentin Other  (See Comments)    Dizziness    Sulfa Antibiotics Other (See Comments)   Sulfonamide Derivatives    Discharge Instructions     Call MD for:  difficulty breathing, headache or visual disturbances   Complete by: As directed    Call MD for:  extreme fatigue   Complete by: As directed    Call MD for:  persistant dizziness or light-headedness   Complete by: As directed    Call MD for:  severe uncontrolled pain   Complete by: As directed    Diet - low sodium heart healthy   Complete by: As directed    Discharge instructions   Complete by: As directed    Follow-up with PCP in 1 week Follow with cardiology as an outpatient for cardiac monitor and continue to monitor BP and heart rate at home.   Increase activity slowly   Complete by: As directed        The results of significant diagnostics from this hospitalization (including imaging, microbiology, ancillary and laboratory) are listed below for reference.    Significant Diagnostic Studies: DG Chest 1 View  Result Date: 03/09/2021 CLINICAL DATA:  Fall.  Weakness EXAM: CHEST  1 VIEW COMPARISON:  09/02/2020 FINDINGS: Mild cardiac enlargement without heart failure. Lungs are clear without infiltrate. Possible small left effusion. Hiatal hernia. Atherosclerotic aorta. No displaced rib fracture. IMPRESSION: Small left pleural effusion and hiatal hernia. Electronically Signed   By: Marlan Palau M.D.   On: 03/09/2021 14:29   DG Pelvis 1-2 Views  Result Date: 03/09/2021 CLINICAL DATA:  Fall.  Left hip pain EXAM: PELVIS - 1-2 VIEW COMPARISON:  None. FINDINGS: No fracture identified. Both hips in internal location limiting evaluation. The patient has further hip pain, consider follow-up dedicated hip views for further evaluation. IMPRESSION: No fracture identified. Electronically Signed   By: Marlan Palau M.D.   On: 03/09/2021 14:37   DG Knee Complete 4 Views Left  Result Date: 03/09/2021 CLINICAL DATA:  Fall EXAM: LEFT KNEE - COMPLETE 4+  VIEW COMPARISON:  None. FINDINGS: Tricompartmental degenerative change with joint space narrowing and spurring. Moderate to advanced degenerative change. Negative for fracture.  Small joint effusion. IMPRESSION: Tricompartmental degenerative change.  Negative for fracture. Electronically Signed   By: Marlan Palau M.D.   On: 03/09/2021 14:36   ECHOCARDIOGRAM COMPLETE  Result Date: 03/12/2021  ECHOCARDIOGRAM REPORT   Patient Name:   MAIREN BOHAN Date of Exam: 03/11/2021 Medical Rec #:  [id number]         Height:       65.0 in Accession #:    [id number]        Weight:       160.3 lb Date of Birth:  [birth year]        BSA:          1.800 m Patient Age:    89 years          BP:           113/63 mmHg Patient Gender: F                 HR:           69 bpm. Exam Location:  [facility] Procedure: 2D Echo Indications:     Dyspnea  History:         Patient has prior history of Echocardiogram examinations. Risk                  Factors:Dyslipidemia.  Sonographer:     Hilbert Corrigan Thornton-Maynard Referring Phys:  [id number] Diagnosing Phys: Adrian Blackwater MD IMPRESSIONS  1. Left ventricular ejection fraction, by estimation, is 60 to 65%. The left ventricle has normal function. The left ventricle has no regional wall motion abnormalities. Left ventricular diastolic parameters are consistent with Grade I diastolic dysfunction (impaired relaxation).  2. Right ventricular systolic function is normal. The right ventricular size is normal. There is normal pulmonary artery systolic pressure.  3. The mitral valve is normal in structure. Mild to moderate mitral valve regurgitation. No evidence of mitral stenosis.  4. The aortic valve is normal in structure. Aortic valve regurgitation is not visualized. No aortic stenosis is present.  5. The inferior vena cava is normal in size with greater than 50% respiratory variability, suggesting right atrial pressure of 3 mmHg. FINDINGS  Left Ventricle: Left ventricular ejection fraction,  by estimation, is 60 to 65%. The left ventricle has normal function. The left ventricle has no regional wall motion abnormalities. The left ventricular internal cavity size was normal in size. There is  no left ventricular hypertrophy. Left ventricular diastolic parameters are consistent with Grade I diastolic dysfunction (impaired relaxation). Right Ventricle: The right ventricular size is normal. No increase in right ventricular wall thickness. Right ventricular systolic function is normal. There is normal pulmonary artery systolic pressure. The tricuspid regurgitant velocity is 2.31 m/s, and  with an assumed right atrial pressure of 3 mmHg, the estimated right ventricular systolic pressure is 24.3 mmHg. Left Atrium: Left atrial size was normal in size. Right Atrium: Right atrial size was normal in size. Pericardium: There is no evidence of pericardial effusion. Mitral Valve: The mitral valve is normal in structure. Mild to moderate mitral valve regurgitation. No evidence of mitral valve stenosis. Tricuspid Valve: The tricuspid valve is normal in structure. Tricuspid valve regurgitation is not demonstrated. No evidence of tricuspid stenosis. Aortic Valve: The aortic valve is normal in structure. Aortic valve regurgitation is not visualized. No aortic stenosis is present. Aortic valve mean gradient measures 4.0 mmHg. Aortic valve peak gradient measures 7.7 mmHg. Aortic valve area, by VTI measures 1.44 cm. Pulmonic Valve: The pulmonic valve was normal in structure. Pulmonic valve regurgitation is not visualized. No evidence of pulmonic stenosis. Aorta: The aortic root is normal in size and structure. Venous: The inferior vena cava is normal in size  with greater than 50% respiratory variability, suggesting right atrial pressure of 3 mmHg. IAS/Shunts: No atrial level shunt detected by color flow Doppler.  LEFT VENTRICLE PLAX 2D LVIDd:         4.00 cm  Diastology LVIDs:         1.85 cm  LV e' medial:    5.03 cm/s LV  PW:         1.40 cm  LV E/e' medial:  10.5 LV IVS:        1.40 cm  LV e' lateral:   8.35 cm/s LVOT diam:     2.10 cm  LV E/e' lateral: 6.3 LV SV:         41 LV SV Index:   23 LVOT Area:     3.46 cm  RIGHT VENTRICLE RV S prime:     6.32 cm/s TAPSE (M-mode): 1.6 cm LEFT ATRIUM             Index LA diam:        4.50 cm 2.50 cm/m LA Vol (A2C):   69.2 ml 38.43 ml/m LA Vol (A4C):   51.0 ml 28.33 ml/m LA Biplane Vol: 61.0 ml 33.88 ml/m  AORTIC VALVE                   PULMONIC VALVE AV Area (Vmax):    1.79 cm    PV Vmax:       1.18 m/s AV Area (Vmean):   1.72 cm    PV Peak grad:  5.6 mmHg AV Area (VTI):     1.44 cm AV Vmax:           139.00 cm/s AV Vmean:          98.200 cm/s AV VTI:            0.283 m AV Peak Grad:      7.7 mmHg AV Mean Grad:      4.0 mmHg LVOT Vmax:         71.80 cm/s LVOT Vmean:        48.900 cm/s LVOT VTI:          0.118 m LVOT/AV VTI ratio: 0.42  AORTA Ao Root diam: 3.40 cm Ao Asc diam:  3.80 cm MITRAL VALVE               TRICUSPID VALVE MV Area (PHT): 2.48 cm    TR Peak grad:   21.3 mmHg MV Decel Time: 306 msec    TR Vmax:        231.00 cm/s MV E velocity: 52.90 cm/s MV A velocity: 60.60 cm/s  SHUNTS MV E/A ratio:  0.87        Systemic VTI:  0.12 m                            Systemic Diam: 2.10 cm Adrian Blackwater MD Electronically signed by Adrian Blackwater MD Signature Date/Time: 03/12/2021/9:37:11 AM    Final     Microbiology: Recent Results (from the past 240 hour(s))  Resp Panel by RT-PCR (Flu A&B, Covid) Nasopharyngeal Swab     Status: None   Collection Time: 03/09/21  1:01 PM   Specimen: Nasopharyngeal Swab; Nasopharyngeal(NP) swabs in vial transport medium  Result Value Ref Range Status   SARS Coronavirus 2 by RT PCR NEGATIVE NEGATIVE Final    Comment: (NOTE) SARS-CoV-2 target nucleic acids are NOT DETECTED.  The SARS-CoV-2 RNA is  generally detectable in upper respiratory specimens during the acute phase of infection. The lowest concentration of SARS-CoV-2 viral copies this assay  can detect is 138 copies/mL. A negative result does not preclude SARS-Cov-2 infection and should not be used as the sole basis for treatment or other patient management decisions. A negative result may occur with  improper specimen collection/handling, submission of specimen other than nasopharyngeal swab, presence of viral mutation(s) within the areas targeted by this assay, and inadequate number of viral copies(<138 copies/mL). A negative result must be combined with clinical observations, patient history, and epidemiological information. The expected result is Negative.  Fact Sheet for Patients:  BloggerCourse.com  Fact Sheet for Healthcare Providers:  SeriousBroker.it  This test is no t yet approved or cleared by the [place] FDA and  has been authorized for detection and/or diagnosis of SARS-CoV-2 by FDA under an Emergency Use Authorization (EUA). This EUA will remain  in effect (meaning this test can be used) for the duration of the COVID-19 declaration under Section 564(b)(1) of the Act, 21 U.S.C.section 360bbb-3(b)(1), unless the authorization is terminated  or revoked sooner.       Influenza A by PCR NEGATIVE NEGATIVE Final   Influenza B by PCR NEGATIVE NEGATIVE Final    Comment: (NOTE) The Xpert Xpress SARS-CoV-2/FLU/RSV plus assay is intended as an aid in the diagnosis of influenza from Nasopharyngeal swab specimens and should not be used as a sole basis for treatment. Nasal washings and aspirates are unacceptable for Xpert Xpress SARS-CoV-2/FLU/RSV testing.  Fact Sheet for Patients: BloggerCourse.com  Fact Sheet for Healthcare Providers: SeriousBroker.it  This test is not yet approved or cleared by the [place] FDA and has been authorized for detection and/or diagnosis of SARS-CoV-2 by FDA under an Emergency Use Authorization (EUA). This EUA will  remain in effect (meaning this test can be used) for the duration of the COVID-19 declaration under Section 564(b)(1) of the Act, 21 U.S.C. section 360bbb-3(b)(1), unless the authorization is terminated or revoked.  Performed at [facility], [address], [place], [place] [address]   Urine Culture     Status: Abnormal   Collection Time: 03/09/21  1:01 PM   Specimen: Urine, Clean Catch  Result Value Ref Range Status   Specimen Description   Final    URINE, CLEAN CATCH Performed at [facility], [address] [place], [place] [address]    Special Requests   Final    NONE Performed at [facility], [address] [place], [place] [address]    Culture (A)  Final    >=100,000 COLONIES/mL ESCHERICHIA COLI Confirmed Extended Spectrum Beta-Lactamase Producer (ESBL).  In bloodstream infections from ESBL organisms, carbapenems are preferred over piperacillin/tazobactam. They are shown to have a lower risk of mortality.    Report Status 03/12/2021 FINAL  Final   Organism ID, Bacteria ESCHERICHIA COLI (A)  Final      Susceptibility   Escherichia coli - MIC*    AMPICILLIN >=32 RESISTANT Resistant     CEFAZOLIN >=64 RESISTANT Resistant     CEFEPIME >=32 RESISTANT Resistant     CEFTRIAXONE >=64 RESISTANT Resistant     CIPROFLOXACIN >=4 RESISTANT Resistant     GENTAMICIN <=1 SENSITIVE Sensitive     IMIPENEM <=0.25 SENSITIVE Sensitive     NITROFURANTOIN <=16 SENSITIVE Sensitive     TRIMETH/SULFA >=320 RESISTANT Resistant     AMPICILLIN/SULBACTAM 16 INTERMEDIATE Intermediate     PIP/TAZO <=4 SENSITIVE Sensitive     * >=  100,000 COLONIES/mL ESCHERICHIA COLI     Labs: CBC: Recent Labs  Lab 03/09/21 1156 03/10/21 0434 03/12/21 0607  WBC 10.6* 9.5 9.8  HGB 12.4 10.9* 11.4*  HCT 37.7 33.6* 34.3*  MCV 90.8 91.6 90.0  PLT 220 191 213   Basic Metabolic Panel: Recent Labs  Lab 03/09/21 1156 03/10/21 0434 03/12/21 0607  NA 141 139 136  K 4.1 3.8  4.0  CL 104 109 100  CO2 25 26 29   GLUCOSE 102* 86 98  BUN 23 23 11   CREATININE 0.59 0.67 0.79  CALCIUM 9.1 8.4* 8.7*  MG  --   --  2.0  PHOS  --   --  2.9   Liver Function Tests: No results for input(s): AST, ALT, ALKPHOS, BILITOT, PROT, ALBUMIN in the last 168 hours. No results for input(s): LIPASE, AMYLASE in the last 168 hours. No results for input(s): AMMONIA in the last 168 hours. Cardiac Enzymes: No results for input(s): CKTOTAL, CKMB, CKMBINDEX, TROPONINI in the last 168 hours. BNP (last 3 results) No results for input(s): BNP in the last 8760 hours. CBG: Recent Labs  Lab 03/11/21 1215  GLUCAP 114*    Time spent: 35 minutes  Signed:  Gillis Santa  [facility]  03/12/2021 1:57 PM

## 2021-03-12 NOTE — TOC Transition Note (Signed)
Transition of Care [facility]) - CM/SW Discharge Note   Patient Details  Name: Abigail Strong MRN: [id number] Date of Birth: 1930-04-17  [facility] CM/SW Contact:  Alberteen Sam, LCSW Phone Number: 03/12/2021, 2:10 PM   Clinical Narrative:     CSW notes patient to discharge home today, is set up with [place] with [facility] informed of patient's dc today. Patient's son aware as well.   CSW spoke with Brenton Grills at [facility] who will be delivering rolling walker and 3in1 to room.   No other discharge needs identified at this time.   Final next level of care: [place] Barriers to Discharge: No Barriers Identified   Patient Goals and CMS Choice Patient states their goals for this hospitalization and ongoing recovery are:: to go home CMS Medicare.gov Compare Post Acute Care list provided to:: Patient Choice offered to / list presented to : Patient  Discharge Placement                  Name of family member notified: son Patient and family notified of of transfer: 03/12/21  Discharge Plan and Services                DME Arranged: 3-N-1, Walker rolling   Date DME Agency Contacted: 03/12/21 Time DME Agency Contacted: [phone] Representative spoke with at DME Agency: Brenton Grills with [employer] HH Arranged: PT, Social Work [employer] Agency: [place] Date [place]: 03/12/21 Time [place]: 75 Representative spoke with at [place]: [place] ([place]) Interventions     Readmission Risk Interventions No flowsheet data found.

## 2021-03-12 NOTE — Consult Note (Signed)
Abigail Strong is a 87 y.o. female  [id number]  Primary Cardiologist: Adrian Blackwater Reason for Consultation: Presyncope  HPI: This 87 year old white female who presented to the hospital after falling down and was found to have E. coli UTI with dehydration hypotension and bradycardia.  She had a heart rate of 41 when she came in and thus I was asked to evaluate the patient.   Review of Systems: No chest pain   Past Medical History:  Diagnosis Date   Depression    GERD (gastroesophageal reflux disease)    Hyperlipidemia    Hyperlipidemia, unspecified 01/01/2014   Hypothyroidism    Insomnia    Musculoskeletal chest pain 09/27/2016    Medications Prior to Admission  Medication Sig Dispense Refill   acetaminophen (TYLENOL) 500 MG tablet Take 1,000 mg by mouth every 6 (six) hours as needed for mild pain or moderate pain.     albuterol (PROVENTIL) (2.5 MG/3ML) 0.083% nebulizer solution USE 1 VIAL IN NEBULIZER ONCE DAILY AS NEEDED (Patient taking differently: Take 2.5 mg by nebulization daily as needed for wheezing or shortness of breath.) 75 mL 0   aspirin 81 MG chewable tablet Chew 81 mg by mouth daily.     clopidogrel (PLAVIX) 75 MG tablet Take 1 tablet by mouth once daily (Patient taking differently: Take 75 mg by mouth daily.) 90 tablet 0   donepezil (ARICEPT ODT) 5 MG disintegrating tablet Take 1 tablet (5 mg total) by mouth at bedtime. 90 tablet 2   furosemide (LASIX) 40 MG tablet Take 1 tablet (40 mg total) by mouth daily. (Patient taking differently: Take 40 mg by mouth daily as needed for fluid or edema.) 30 tablet 6   gabapentin (NEURONTIN) 100 MG capsule TAKE 1 CAPSULE BY MOUTH THREE TIMES DAILY (Patient taking differently: Take 100 mg by mouth 3 (three) times daily.) 90 capsule 0   levothyroxine (SYNTHROID) 125 MCG tablet Take 1 tablet by mouth once daily (Patient taking differently: Take 125 mcg by mouth daily.) 90 tablet 0   meloxicam (MOBIC) 7.5 MG tablet Take 1 tablet by  mouth once daily (Patient taking differently: Take 7.5 mg by mouth daily.) 90 tablet 0   omeprazole (PRILOSEC) 40 MG capsule Take 1 capsule (40 mg total) by mouth 2 (two) times daily. 180 capsule 2   pravastatin (PRAVACHOL) 40 MG tablet TAKE 1 TABLET BY MOUTH AT BEDTIME (Patient taking differently: Take 40 mg by mouth at bedtime.) 90 tablet 0   pregabalin (LYRICA) 50 MG capsule Take 1 capsule (50 mg total) by mouth 2 (two) times daily. 60 capsule 2   ciprofloxacin (CILOXAN) 0.3 % ophthalmic solution Place 1 drop into both eyes daily at 2 PM. Administer 1 drop, every 2 hours, while awake, for 2 days. Then 1 drop, every 4 hours, while awake, for the next 5 days. (Patient not taking: No sig reported) 5 mL 6   gentamicin (GENTAK) 0.3 % ophthalmic ointment Place into both eyes 3 (three) times daily. (Patient not taking: No sig reported) 3.5 g 6   methylPREDNISolone (MEDROL DOSEPAK) 4 MG TBPK tablet Take by mouth as directed. (Patient not taking: No sig reported) 21 each 0      aspirin  81 mg Oral Daily   clopidogrel  75 mg Oral Daily   cyanocobalamin  1,000 mcg Intramuscular Daily   donepezil  5 mg Oral QHS   enoxaparin (LOVENOX) injection  40 mg Subcutaneous Q24H   gabapentin  100 mg Oral TID  iron polysaccharides  150 mg Oral Daily   levothyroxine  125 mcg Oral Daily   melatonin  5 mg Oral QHS   pantoprazole  40 mg Oral QHS   polyethylene glycol  17 g Oral Daily   pravastatin  40 mg Oral QHS   pregabalin  50 mg Oral BID   [START ON 03/13/2021] vitamin B-12  100 mcg Oral Daily    Infusions:  cefTRIAXone (ROCEPHIN)  IV 1 g (03/11/21 0913)    Allergies  Allergen Reactions   Gabapentin Other (See Comments)    Dizziness    Sulfa Antibiotics Other (See Comments)   Sulfonamide Derivatives     Social History   Socioeconomic History   Marital status: Married    Spouse name: Not on file   Number of children: Not on file   Years of education: Not on file   Highest education level: Not  on file  Occupational History   Not on file  Tobacco Use   Smoking status: Never   Smokeless tobacco: Never  Substance and Sexual Activity   Alcohol use: No   Drug use: No   Sexual activity: Not Currently    Birth control/protection: Post-menopausal  Other Topics Concern   Not on file  Social History Narrative   Not on file   Social Determinants of Health   Financial Resource Strain: Not on file  Food Insecurity: Not on file  Transportation Needs: Not on file  Physical Activity: Not on file  Stress: Not on file  Social Connections: Not on file  Intimate Partner Violence: Not on file    Family History  Problem Relation Age of Onset   Cervical cancer Mother    Breast cancer Maternal Aunt     PHYSICAL EXAM: Vitals:   03/12/21 0531 03/12/21 0741  BP: 114/70 102/60  Pulse: 80 72  Resp: 18 18  Temp: 98.7 F (37.1 C) 98.4 F (36.9 C)  SpO2: (!) 88% 90%     Intake/Output Summary (Last 24 hours) at 03/12/2021 0940 Last data filed at 03/11/2021 1853 Gross per 24 hour  Intake 240 ml  Output 500 ml  Net -260 ml    General:  Well appearing. No respiratory difficulty HEENT: normal Neck: supple. no JVD. Carotids 2+ bilat; no bruits. No lymphadenopathy or thryomegaly appreciated. Cor: PMI nondisplaced. Regular rate & rhythm. No rubs, gallops or murmurs. Lungs: clear Abdomen: soft, nontender, nondistended. No hepatosplenomegaly. No bruits or masses. Good bowel sounds. Extremities: no cyanosis, clubbing, rash, edema Neuro: alert & oriented x 3, cranial nerves grossly intact. moves all 4 extremities w/o difficulty. Affect pleasant.  ECG: Normal sinus rhythm about heart rate 70 bpm  Results for orders placed or performed during the hospital encounter of 03/09/21 (from the past 24 hour(s))  Glucose, capillary     Status: Abnormal   Collection Time: 03/11/21 12:15 PM  Result Value Ref Range   Glucose-Capillary 114 (H) 70 - 99 mg/dL  CBC     Status: Abnormal   Collection  Time: 03/12/21  6:07 AM  Result Value Ref Range   WBC 9.8 4.0 - 10.5 K/uL   RBC 3.81 (L) 3.87 - 5.11 MIL/uL   Hemoglobin 11.4 (L) 12.0 - 15.0 g/dL   HCT 78.2 (L) 95.6 - 21.3 %   MCV 90.0 80.0 - 100.0 fL   MCH 29.9 26.0 - 34.0 pg   MCHC 33.2 30.0 - 36.0 g/dL   RDW 08.6 57.8 - 46.9 %   Platelets 213 150 -  400 K/uL   nRBC 0.0 0.0 - 0.2 %  Basic metabolic panel     Status: Abnormal   Collection Time: 03/12/21  6:07 AM  Result Value Ref Range   Sodium 136 135 - 145 mmol/L   Potassium 4.0 3.5 - 5.1 mmol/L   Chloride 100 98 - 111 mmol/L   CO2 29 22 - 32 mmol/L   Glucose, Bld 98 70 - 99 mg/dL   BUN 11 8 - 23 mg/dL   Creatinine, Ser 9.62 0.44 - 1.00 mg/dL   Calcium 8.7 (L) 8.9 - 10.3 mg/dL   GFR, Estimated >95 >28 mL/min   Anion gap 7 5 - 15  Magnesium     Status: None   Collection Time: 03/12/21  6:07 AM  Result Value Ref Range   Magnesium 2.0 1.7 - 2.4 mg/dL  Phosphorus     Status: None   Collection Time: 03/12/21  6:07 AM  Result Value Ref Range   Phosphorus 2.9 2.5 - 4.6 mg/dL   ECHOCARDIOGRAM COMPLETE  Result Date: 03/12/2021    ECHOCARDIOGRAM REPORT   Patient Name:   Abigail Strong Date of Exam: 03/11/2021 Medical Rec #:  [id number]         Height:       65.0 in Accession #:    [id number]        Weight:       160.3 lb Date of Birth:  12/12/1929        BSA:          1.800 m Patient Age:    89 years          BP:           113/63 mmHg Patient Gender: F                 HR:           69 bpm. Exam Location:  [facility] Procedure: 2D Echo Indications:     Dyspnea  History:         Patient has prior history of Echocardiogram examinations. Risk                  Factors:Dyslipidemia.  Sonographer:     Hilbert Corrigan Thornton-Maynard Referring Phys:  [id number] DGLOVF IEPPI Diagnosing Phys: Adrian Blackwater MD IMPRESSIONS  1. Left ventricular ejection fraction, by estimation, is 60 to 65%. The left ventricle has normal function. The left ventricle has no regional wall motion abnormalities. Left ventricular  diastolic parameters are consistent with Grade I diastolic dysfunction (impaired relaxation).  2. Right ventricular systolic function is normal. The right ventricular size is normal. There is normal pulmonary artery systolic pressure.  3. The mitral valve is normal in structure. Mild to moderate mitral valve regurgitation. No evidence of mitral stenosis.  4. The aortic valve is normal in structure. Aortic valve regurgitation is not visualized. No aortic stenosis is present.  5. The inferior vena cava is normal in size with greater than 50% respiratory variability, suggesting right atrial pressure of 3 mmHg. FINDINGS  Left Ventricle: Left ventricular ejection fraction, by estimation, is 60 to 65%. The left ventricle has normal function. The left ventricle has no regional wall motion abnormalities. The left ventricular internal cavity size was normal in size. There is  no left ventricular hypertrophy. Left ventricular diastolic parameters are consistent with Grade I diastolic dysfunction (impaired relaxation). Right Ventricle: The right ventricular size is normal. No increase in right ventricular wall thickness. Right ventricular systolic function  is normal. There is normal pulmonary artery systolic pressure. The tricuspid regurgitant velocity is 2.31 m/s, and  with an assumed right atrial pressure of 3 mmHg, the estimated right ventricular systolic pressure is 24.3 mmHg. Left Atrium: Left atrial size was normal in size. Right Atrium: Right atrial size was normal in size. Pericardium: There is no evidence of pericardial effusion. Mitral Valve: The mitral valve is normal in structure. Mild to moderate mitral valve regurgitation. No evidence of mitral valve stenosis. Tricuspid Valve: The tricuspid valve is normal in structure. Tricuspid valve regurgitation is not demonstrated. No evidence of tricuspid stenosis. Aortic Valve: The aortic valve is normal in structure. Aortic valve regurgitation is not visualized. No aortic  stenosis is present. Aortic valve mean gradient measures 4.0 mmHg. Aortic valve peak gradient measures 7.7 mmHg. Aortic valve area, by VTI measures 1.44 cm. Pulmonic Valve: The pulmonic valve was normal in structure. Pulmonic valve regurgitation is not visualized. No evidence of pulmonic stenosis. Aorta: The aortic root is normal in size and structure. Venous: The inferior vena cava is normal in size with greater than 50% respiratory variability, suggesting right atrial pressure of 3 mmHg. IAS/Shunts: No atrial level shunt detected by color flow Doppler.  LEFT VENTRICLE PLAX 2D LVIDd:         4.00 cm  Diastology LVIDs:         1.85 cm  LV e' medial:    5.03 cm/s LV PW:         1.40 cm  LV E/e' medial:  10.5 LV IVS:        1.40 cm  LV e' lateral:   8.35 cm/s LVOT diam:     2.10 cm  LV E/e' lateral: 6.3 LV SV:         41 LV SV Index:   23 LVOT Area:     3.46 cm  RIGHT VENTRICLE RV S prime:     6.32 cm/s TAPSE (M-mode): 1.6 cm LEFT ATRIUM             Index LA diam:        4.50 cm 2.50 cm/m LA Vol (A2C):   69.2 ml 38.43 ml/m LA Vol (A4C):   51.0 ml 28.33 ml/m LA Biplane Vol: 61.0 ml 33.88 ml/m  AORTIC VALVE                   PULMONIC VALVE AV Area (Vmax):    1.79 cm    PV Vmax:       1.18 m/s AV Area (Vmean):   1.72 cm    PV Peak grad:  5.6 mmHg AV Area (VTI):     1.44 cm AV Vmax:           139.00 cm/s AV Vmean:          98.200 cm/s AV VTI:            0.283 m AV Peak Grad:      7.7 mmHg AV Mean Grad:      4.0 mmHg LVOT Vmax:         71.80 cm/s LVOT Vmean:        48.900 cm/s LVOT VTI:          0.118 m LVOT/AV VTI ratio: 0.42  AORTA Ao Root diam: 3.40 cm Ao Asc diam:  3.80 cm MITRAL VALVE               TRICUSPID VALVE MV Area (PHT): 2.48 cm  TR Peak grad:   21.3 mmHg MV Decel Time: 306 msec    TR Vmax:        231.00 cm/s MV E velocity: 52.90 cm/s MV A velocity: 60.60 cm/s  SHUNTS MV E/A ratio:  0.87        Systemic VTI:  0.12 m                            Systemic Diam: 2.10 cm Adrian Blackwater MD Electronically  signed by Adrian Blackwater MD Signature Date/Time: 03/12/2021/9:37:11 AM    Final      ASSESSMENT AND PLAN: Sinus bradycardia on monitor but not on EKG on presentation with hypotension related to urinary tract infection and dehydration.  Apparently patient had heart rate of 41 when she came in on monitor but all the EKGs had heart rate over 70.  Right now heart rate is 78-80 and is feeling much better.  No evidence of any high-grade AV block to suggest that she needs a pacemaker.  We will follow the patient closely with you on monitor.  Echocardiogram shows normal ejection fraction.  Jesslyn Viglione A

## 2021-03-12 NOTE — Progress Notes (Signed)
Patient discharged to home in stable condition. Discharge instructions provided to both patient and son, who transported patient home via private vehicle.

## 2021-03-13 ENCOUNTER — Other Ambulatory Visit: Payer: Self-pay | Admitting: *Deleted

## 2021-03-13 DIAGNOSIS — M170 Bilateral primary osteoarthritis of knee: Secondary | ICD-10-CM | POA: Diagnosis not present

## 2021-03-14 DIAGNOSIS — J449 Chronic obstructive pulmonary disease, unspecified: Secondary | ICD-10-CM | POA: Diagnosis not present

## 2021-03-14 DIAGNOSIS — R9431 Abnormal electrocardiogram [ECG] [EKG]: Secondary | ICD-10-CM | POA: Diagnosis not present

## 2021-03-14 DIAGNOSIS — I10 Essential (primary) hypertension: Secondary | ICD-10-CM | POA: Diagnosis not present

## 2021-03-14 DIAGNOSIS — I340 Nonrheumatic mitral (valve) insufficiency: Secondary | ICD-10-CM | POA: Diagnosis not present

## 2021-03-14 DIAGNOSIS — R609 Edema, unspecified: Secondary | ICD-10-CM | POA: Diagnosis not present

## 2021-03-14 DIAGNOSIS — E039 Hypothyroidism, unspecified: Secondary | ICD-10-CM | POA: Diagnosis not present

## 2021-03-14 DIAGNOSIS — R0602 Shortness of breath: Secondary | ICD-10-CM | POA: Diagnosis not present

## 2021-03-16 DIAGNOSIS — G4730 Sleep apnea, unspecified: Secondary | ICD-10-CM | POA: Diagnosis not present

## 2021-03-16 DIAGNOSIS — R0683 Snoring: Secondary | ICD-10-CM | POA: Diagnosis not present

## 2021-03-17 ENCOUNTER — Other Ambulatory Visit: Payer: Self-pay

## 2021-03-17 ENCOUNTER — Ambulatory Visit (INDEPENDENT_AMBULATORY_CARE_PROVIDER_SITE_OTHER): Payer: Medicare Other | Admitting: Internal Medicine

## 2021-03-17 ENCOUNTER — Other Ambulatory Visit: Payer: Self-pay | Admitting: *Deleted

## 2021-03-17 DIAGNOSIS — N390 Urinary tract infection, site not specified: Secondary | ICD-10-CM | POA: Diagnosis not present

## 2021-03-17 LAB — POCT URINALYSIS DIPSTICK
Bilirubin, UA: NEGATIVE
Glucose, UA: NEGATIVE
Ketones, UA: NEGATIVE
Nitrite, UA: NEGATIVE
Protein, UA: NEGATIVE
Spec Grav, UA: 1.015 (ref 1.010–1.025)
Urobilinogen, UA: 0.2 E.U./dL
pH, UA: 5.5 (ref 5.0–8.0)

## 2021-03-17 MED ORDER — CLOTRIMAZOLE-BETAMETHASONE 1-0.05 % EX CREA: 1.0000 "application " | TOPICAL_CREAM | Freq: Two times a day (BID) | CUTANEOUS | 1 refills | Status: DC

## 2021-03-17 MED ORDER — CIPROFLOXACIN HCL 500 MG PO TABS: 500.0000 mg | ORAL_TABLET | Freq: Two times a day (BID) | ORAL | 0 refills | Status: AC

## 2021-03-21 ENCOUNTER — Other Ambulatory Visit: Payer: Self-pay

## 2021-03-21 ENCOUNTER — Other Ambulatory Visit: Payer: Self-pay | Admitting: *Deleted

## 2021-03-21 ENCOUNTER — Ambulatory Visit (INDEPENDENT_AMBULATORY_CARE_PROVIDER_SITE_OTHER): Payer: Medicare Other | Admitting: Internal Medicine

## 2021-03-21 ENCOUNTER — Encounter: Payer: Self-pay | Admitting: Internal Medicine

## 2021-03-21 VITALS — BP 120/85 | HR 68 | Ht 65.0 in | Wt 155.6 lb

## 2021-03-21 DIAGNOSIS — K219 Gastro-esophageal reflux disease without esophagitis: Secondary | ICD-10-CM

## 2021-03-21 DIAGNOSIS — R600 Localized edema: Secondary | ICD-10-CM

## 2021-03-21 DIAGNOSIS — N390 Urinary tract infection, site not specified: Secondary | ICD-10-CM

## 2021-03-21 DIAGNOSIS — I10 Essential (primary) hypertension: Secondary | ICD-10-CM | POA: Diagnosis not present

## 2021-03-21 DIAGNOSIS — I872 Venous insufficiency (chronic) (peripheral): Secondary | ICD-10-CM

## 2021-03-21 LAB — POCT URINALYSIS DIPSTICK
Bilirubin, UA: NEGATIVE
Blood, UA: POSITIVE
Glucose, UA: NEGATIVE
Ketones, UA: NEGATIVE
Nitrite, UA: POSITIVE
Protein, UA: POSITIVE — AB
Spec Grav, UA: 1.025 (ref 1.010–1.025)
Urobilinogen, UA: 0.2 E.U./dL
pH, UA: 5.5 (ref 5.0–8.0)

## 2021-03-21 MED ORDER — NITROFURANTOIN MACROCRYSTAL 100 MG PO CAPS: 100.0000 mg | ORAL_CAPSULE | Freq: Two times a day (BID) | ORAL | 0 refills | Status: DC

## 2021-03-21 NOTE — Assessment & Plan Note (Signed)
Avoid salt intake

## 2021-03-21 NOTE — Assessment & Plan Note (Signed)
Patient educated extensively on acid reflux lifestyle modification, including buying a bed wedge, not eating 3 hrs before bedtime, diet modifications, and handout given for the same.  

## 2021-03-21 NOTE — Assessment & Plan Note (Signed)
We will start the patient on Macrodantin as she is not sensitive to other antibiotics

## 2021-03-21 NOTE — Progress Notes (Signed)
Established Patient Office Visit  Subjective:  Patient ID: Abigail Strong, female    DOB: [birth year]  Age: 87 y.o. MRN: [id number]  CC:  Chief Complaint  Patient presents with   Follow-up    HPI  SHIRA PADUANO presents for Patient was seen in the hospital on 721 with symptoms of weakness near fainting.  She was evaluated for bradycardia arrhythmia and now has been scheduled to have an Holter monitor for 7 days and a stress test.    Patient complaining of symptoms of urinary tract infection and generalized weakness.  X-ray did not show any pneumonia in the hospital, she was treated for urinary tract infection.,  She was treated for urinary tract infection.  Past Medical History:  Diagnosis Date   Depression    GERD (gastroesophageal reflux disease)    Hyperlipidemia    Hyperlipidemia, unspecified 01/01/2014   Hypothyroidism    Insomnia    Musculoskeletal chest pain 09/27/2016    Past Surgical History:  Procedure Laterality Date   ABDOMINAL HYSTERECTOMY     BREAST BIOPSY Bilateral 90's   benign   cataracts     COLON SURGERY     partial colectomy   intestinal blockage     NISSEN FUNDOPLICATION      Family History  Problem Relation Age of Onset   Cervical cancer Mother    Breast cancer Maternal Aunt     Social History   Socioeconomic History   Marital status: Married    Spouse name: Not on file   Number of children: Not on file   Years of education: Not on file   Highest education level: Not on file  Occupational History   Not on file  Tobacco Use   Smoking status: Never   Smokeless tobacco: Never  Substance and Sexual Activity   Alcohol use: No   Drug use: No   Sexual activity: Not Currently    Birth control/protection: Post-menopausal  Other Topics Concern   Not on file  Social History Narrative   Not on file   Social Determinants of Health   Financial Resource Strain: Not on file  Food Insecurity: Not on file  Transportation Needs: Not on  file  Physical Activity: Not on file  Stress: Not on file  Social Connections: Not on file  Intimate Partner Violence: Not on file     Current Outpatient Medications:    acetaminophen (TYLENOL) 500 MG tablet, Take 1,000 mg by mouth every 6 (six) hours as needed for mild pain or moderate pain., Disp: , Rfl:    albuterol (PROVENTIL) (2.5 MG/3ML) 0.083% nebulizer solution, USE 1 VIAL IN NEBULIZER ONCE DAILY AS NEEDED (Patient taking differently: Take 2.5 mg by nebulization daily as needed for wheezing or shortness of breath.), Disp: 75 mL, Rfl: 0   aspirin 81 MG chewable tablet, Chew 81 mg by mouth daily., Disp: , Rfl:    bisacodyl (DULCOLAX) 5 MG EC tablet, Take 2 tablets (10 mg total) by mouth daily as needed for moderate constipation., Disp: 30 tablet, Rfl: 0   ciprofloxacin (CIPRO) 500 MG tablet, Take 1 tablet (500 mg total) by mouth 2 (two) times daily for 5 days., Disp: 10 tablet, Rfl: 0   clopidogrel (PLAVIX) 75 MG tablet, Take 1 tablet by mouth once daily (Patient taking differently: Take 75 mg by mouth daily.), Disp: 90 tablet, Rfl: 0   clotrimazole-betamethasone (LOTRISONE) cream, Apply 1 application topically 2 (two) times daily., Disp: 15 g, Rfl: 1   donepezil (  ARICEPT ODT) 5 MG disintegrating tablet, Take 1 tablet (5 mg total) by mouth at bedtime., Disp: 90 tablet, Rfl: 2   furosemide (LASIX) 40 MG tablet, Take 1 tablet (40 mg total) by mouth daily. (Patient taking differently: Take 40 mg by mouth daily as needed for fluid or edema.), Disp: 30 tablet, Rfl: 6   gabapentin (NEURONTIN) 100 MG capsule, TAKE 1 CAPSULE BY MOUTH THREE TIMES DAILY (Patient taking differently: Take 100 mg by mouth 3 (three) times daily.), Disp: 90 capsule, Rfl: 0   iron polysaccharides (NIFEREX) 150 MG capsule, Take 1 capsule (150 mg total) by mouth daily., Disp: 30 capsule, Rfl: 2   levothyroxine (SYNTHROID) 125 MCG tablet, Take 1 tablet by mouth once daily (Patient taking differently: Take 125 mcg by mouth  daily.), Disp: 90 tablet, Rfl: 0   melatonin 5 MG TABS, Take 1 tablet (5 mg total) by mouth at bedtime., Disp: 30 tablet, Rfl: 2   meloxicam (MOBIC) 7.5 MG tablet, Take 1 tablet by mouth once daily (Patient taking differently: Take 7.5 mg by mouth daily.), Disp: 90 tablet, Rfl: 0   nitrofurantoin (MACRODANTIN) 100 MG capsule, Take 1 capsule (100 mg total) by mouth 2 (two) times daily., Disp: 20 capsule, Rfl: 0   omeprazole (PRILOSEC) 40 MG capsule, Take 1 capsule (40 mg total) by mouth daily., Disp: 180 capsule, Rfl: 2   polyethylene glycol (MIRALAX / GLYCOLAX) 17 g packet, Take 17 g by mouth daily., Disp: 14 each, Rfl: 0   pravastatin (PRAVACHOL) 40 MG tablet, TAKE 1 TABLET BY MOUTH AT BEDTIME (Patient taking differently: Take 40 mg by mouth at bedtime.), Disp: 90 tablet, Rfl: 0   pregabalin (LYRICA) 50 MG capsule, Take 1 capsule (50 mg total) by mouth 2 (two) times daily., Disp: 60 capsule, Rfl: 2   vitamin B-12 100 MCG tablet, Take 1 tablet (100 mcg total) by mouth daily., Disp: 30 tablet, Rfl: 2   Allergies  Allergen Reactions   Gabapentin Other (See Comments)    Dizziness    Sulfa Antibiotics Other (See Comments)   Sulfonamide Derivatives     ROS Review of Systems  Constitutional: Negative.   HENT: Negative.    Eyes: Negative.   Respiratory: Negative.  Negative for cough.   Cardiovascular: Negative.  Negative for chest pain.  Gastrointestinal: Negative.  Negative for abdominal distention.  Endocrine: Negative.   Genitourinary:  Positive for dysuria.  Musculoskeletal:  Positive for back pain.  Skin: Negative.   Allergic/Immunologic: Negative.   Neurological: Negative.   Hematological: Negative.   Psychiatric/Behavioral: Negative.    All other systems reviewed and are negative.    Objective:    Physical Exam Vitals reviewed.  Constitutional:      Appearance: Normal appearance.  HENT:     Mouth/Throat:     Mouth: Mucous membranes are moist.  Eyes:     Pupils: Pupils  are equal, round, and reactive to light.  Neck:     Vascular: No carotid bruit.  Cardiovascular:     Rate and Rhythm: Normal rate and regular rhythm.     Pulses: Normal pulses.     Heart sounds: Normal heart sounds.  Pulmonary:     Effort: Pulmonary effort is normal.     Breath sounds: Normal breath sounds.  Abdominal:     General: Bowel sounds are normal.     Palpations: Abdomen is soft. There is no hepatomegaly, splenomegaly or mass.     Tenderness: There is no abdominal tenderness.  Hernia: No hernia is present.  Musculoskeletal:        General: No tenderness.     Cervical back: Neck supple.     Right lower leg: No edema.     Left lower leg: No edema.  Skin:    Findings: No rash.  Neurological:     Mental Status: She is alert and oriented to person, place, and time.     Motor: No weakness.  Psychiatric:        Mood and Affect: Mood and affect normal.        Behavior: Behavior normal.    BP 120/85   Pulse 68   Ht 5\' 5"  (1.651 m)   Wt 155 lb 9.6 oz (70.6 kg)   BMI 25.89 kg/m  Wt Readings from Last 3 Encounters:  03/21/21 155 lb 9.6 oz (70.6 kg)  03/09/21 160 lb 4.4 oz (72.7 kg)  01/23/21 157 lb (71.2 kg)     Health Maintenance Due  Topic Date Due   Zoster Vaccines- Shingrix (1 of 2) Never done   DEXA SCAN  Never done   PNA vac Low Risk Adult (2 of 2 - PCV13) 04/12/2016   COVID-19 Vaccine (4 - Booster for Pfizer series) 10/28/2020   INFLUENZA VACCINE  03/20/2021    There are no preventive care reminders to display for this patient.  Lab Results  Component Value Date   TSH 0.11 (L) 04/26/2020   Lab Results  Component Value Date   WBC 9.8 03/12/2021   HGB 11.4 (L) 03/12/2021   HCT 34.3 (L) 03/12/2021   MCV 90.0 03/12/2021   PLT 213 03/12/2021   Lab Results  Component Value Date   NA 136 03/12/2021   K 4.0 03/12/2021   CO2 29 03/12/2021   GLUCOSE 98 03/12/2021   BUN 11 03/12/2021   CREATININE 0.79 03/12/2021   BILITOT 0.6 04/26/2020   ALKPHOS  52 04/24/2019   AST 15 04/26/2020   ALT 8 04/26/2020   PROT 6.4 04/26/2020   ALBUMIN 4.1 04/24/2019   CALCIUM 8.7 (L) 03/12/2021   ANIONGAP 7 03/12/2021   Lab Results  Component Value Date   CHOL 182 04/26/2020   Lab Results  Component Value Date   HDL 71 04/26/2020   Lab Results  Component Value Date   LDLCALC 92 04/26/2020   Lab Results  Component Value Date   TRIG 97 04/26/2020   Lab Results  Component Value Date   CHOLHDL 2.6 04/26/2020   Lab Results  Component Value Date   HGBA1C 6.3 (H) 09/24/2016      Assessment & Plan:   Problem List Items Addressed This Visit       Cardiovascular and Mediastinum   Essential hypertension    Blood pressure is under control       Edema of lower leg due to peripheral venous insufficiency    Avoid salt intake         Digestive   GERD (gastroesophageal reflux disease)    Patient educated extensively on acid reflux lifestyle modification, including buying a bed wedge, not eating 3 hrs before bedtime, diet modifications, and handout given for the same.          Genitourinary   Acute lower UTI    We will start the patient on Macrodantin as she is not sensitive to other antibiotics       Other Visit Diagnoses     Urinary tract infection without hematuria, site unspecified    -  Primary   Relevant Orders   POCT urinalysis dipstick (Completed)       No orders of the defined types were placed in this encounter.   Follow-up: No follow-ups on file.    Corky Downs, MD

## 2021-03-21 NOTE — Assessment & Plan Note (Signed)
Blood pressure is under control 

## 2021-03-22 DIAGNOSIS — I340 Nonrheumatic mitral (valve) insufficiency: Secondary | ICD-10-CM | POA: Diagnosis not present

## 2021-03-22 DIAGNOSIS — J449 Chronic obstructive pulmonary disease, unspecified: Secondary | ICD-10-CM | POA: Diagnosis not present

## 2021-03-22 DIAGNOSIS — R002 Palpitations: Secondary | ICD-10-CM | POA: Diagnosis not present

## 2021-03-22 DIAGNOSIS — I4891 Unspecified atrial fibrillation: Secondary | ICD-10-CM | POA: Diagnosis not present

## 2021-03-22 DIAGNOSIS — E038 Other specified hypothyroidism: Secondary | ICD-10-CM | POA: Diagnosis not present

## 2021-03-22 DIAGNOSIS — R9431 Abnormal electrocardiogram [ECG] [EKG]: Secondary | ICD-10-CM | POA: Diagnosis not present

## 2021-03-22 DIAGNOSIS — R609 Edema, unspecified: Secondary | ICD-10-CM | POA: Diagnosis not present

## 2021-03-22 DIAGNOSIS — I10 Essential (primary) hypertension: Secondary | ICD-10-CM | POA: Diagnosis not present

## 2021-03-22 DIAGNOSIS — I441 Atrioventricular block, second degree: Secondary | ICD-10-CM | POA: Diagnosis not present

## 2021-03-22 DIAGNOSIS — R9430 Abnormal result of cardiovascular function study, unspecified: Secondary | ICD-10-CM | POA: Diagnosis not present

## 2021-03-23 DIAGNOSIS — R053 Chronic cough: Secondary | ICD-10-CM | POA: Diagnosis not present

## 2021-03-23 DIAGNOSIS — G4733 Obstructive sleep apnea (adult) (pediatric): Secondary | ICD-10-CM | POA: Diagnosis not present

## 2021-03-23 DIAGNOSIS — R0609 Other forms of dyspnea: Secondary | ICD-10-CM | POA: Diagnosis not present

## 2021-03-27 ENCOUNTER — Ambulatory Visit: Payer: Medicare Other | Admitting: Internal Medicine

## 2021-04-03 ENCOUNTER — Other Ambulatory Visit: Payer: Self-pay | Admitting: Internal Medicine

## 2021-04-03 DIAGNOSIS — E038 Other specified hypothyroidism: Secondary | ICD-10-CM | POA: Diagnosis not present

## 2021-04-03 DIAGNOSIS — R002 Palpitations: Secondary | ICD-10-CM | POA: Diagnosis not present

## 2021-04-03 DIAGNOSIS — I10 Essential (primary) hypertension: Secondary | ICD-10-CM | POA: Diagnosis not present

## 2021-04-03 DIAGNOSIS — R9430 Abnormal result of cardiovascular function study, unspecified: Secondary | ICD-10-CM | POA: Diagnosis not present

## 2021-04-03 DIAGNOSIS — I4891 Unspecified atrial fibrillation: Secondary | ICD-10-CM | POA: Diagnosis not present

## 2021-04-03 DIAGNOSIS — R9431 Abnormal electrocardiogram [ECG] [EKG]: Secondary | ICD-10-CM | POA: Diagnosis not present

## 2021-04-03 DIAGNOSIS — I340 Nonrheumatic mitral (valve) insufficiency: Secondary | ICD-10-CM | POA: Diagnosis not present

## 2021-04-03 DIAGNOSIS — J449 Chronic obstructive pulmonary disease, unspecified: Secondary | ICD-10-CM | POA: Diagnosis not present

## 2021-04-03 DIAGNOSIS — I441 Atrioventricular block, second degree: Secondary | ICD-10-CM | POA: Diagnosis not present

## 2021-04-03 DIAGNOSIS — R609 Edema, unspecified: Secondary | ICD-10-CM | POA: Diagnosis not present

## 2021-04-11 ENCOUNTER — Other Ambulatory Visit: Payer: Self-pay | Admitting: Internal Medicine

## 2021-04-11 ENCOUNTER — Ambulatory Visit (INDEPENDENT_AMBULATORY_CARE_PROVIDER_SITE_OTHER): Payer: Medicare Other | Admitting: Internal Medicine

## 2021-04-11 ENCOUNTER — Encounter: Payer: Self-pay | Admitting: Internal Medicine

## 2021-04-11 VITALS — BP 101/72 | HR 87 | Ht 65.0 in | Wt 153.1 lb

## 2021-04-11 DIAGNOSIS — N390 Urinary tract infection, site not specified: Secondary | ICD-10-CM | POA: Diagnosis not present

## 2021-04-11 DIAGNOSIS — I10 Essential (primary) hypertension: Secondary | ICD-10-CM | POA: Diagnosis not present

## 2021-04-11 DIAGNOSIS — K219 Gastro-esophageal reflux disease without esophagitis: Secondary | ICD-10-CM

## 2021-04-11 DIAGNOSIS — M174 Other bilateral secondary osteoarthritis of knee: Secondary | ICD-10-CM

## 2021-04-11 DIAGNOSIS — R9430 Abnormal result of cardiovascular function study, unspecified: Secondary | ICD-10-CM | POA: Diagnosis not present

## 2021-04-11 DIAGNOSIS — R413 Other amnesia: Secondary | ICD-10-CM | POA: Diagnosis not present

## 2021-04-11 NOTE — Assessment & Plan Note (Signed)
Stable at the present time gabapentin as well as Lyrica

## 2021-04-11 NOTE — Assessment & Plan Note (Signed)
Patient does not have any fever or chills no urinary symptoms

## 2021-04-11 NOTE — Assessment & Plan Note (Signed)

## 2021-04-11 NOTE — Assessment & Plan Note (Signed)
She is better Aricept was stopped

## 2021-04-11 NOTE — Progress Notes (Signed)
Established Patient Office Visit  Subjective:  Patient ID: Abigail Strong, female    DOB: 01/30/1930  Age: 87 y.o. MRN: [id number]  CC:  Chief Complaint  Patient presents with   Follow-up    HPI  [name] presents for regular follow-up after discharge from the hospital.  She was admitted with urinary tract infection.  Patient had bradycardia.  Echocardiogram showed good ejection fraction.  She has a history swelling of the left arm because of the procedure done in Dr. Welton Flakes office.  Swelling is not infected and this is due to due to leakage of IV, patient was reassured  Past Medical History:  Diagnosis Date   Depression    GERD (gastroesophageal reflux disease)    Hyperlipidemia    Hyperlipidemia, unspecified 01/01/2014   Hypothyroidism    Insomnia    Musculoskeletal chest pain 09/27/2016    Past Surgical History:  Procedure Laterality Date   ABDOMINAL HYSTERECTOMY     BREAST BIOPSY Bilateral 90's   benign   cataracts     COLON SURGERY     partial colectomy   intestinal blockage     NISSEN FUNDOPLICATION      Family History  Problem Relation Age of Onset   Cervical cancer Mother    Breast cancer Maternal Aunt     Social History   Socioeconomic History   Marital status: Married    Spouse name: Not on file   Number of children: Not on file   Years of education: Not on file   Highest education level: Not on file  Occupational History   Not on file  Tobacco Use   Smoking status: Never   Smokeless tobacco: Never  Substance and Sexual Activity   Alcohol use: No   Drug use: No   Sexual activity: Not Currently    Birth control/protection: Post-menopausal  Other Topics Concern   Not on file  Social History Narrative   Not on file   Social Determinants of Health   Financial Resource Strain: Not on file  Food Insecurity: Not on file  Transportation Needs: Not on file  Physical Activity: Not on file  Stress: Not on file  Social Connections: Not  on file  Intimate Partner Violence: Not on file     Current Outpatient Medications:    acetaminophen (TYLENOL) 500 MG tablet, Take 1,000 mg by mouth every 6 (six) hours as needed for mild pain or moderate pain., Disp: , Rfl:    albuterol (PROVENTIL) (2.5 MG/3ML) 0.083% nebulizer solution, USE 1 VIAL IN NEBULIZER ONCE DAILY AS NEEDED (Patient taking differently: Take 2.5 mg by nebulization daily as needed for wheezing or shortness of breath.), Disp: 75 mL, Rfl: 0   aspirin 81 MG chewable tablet, Chew 81 mg by mouth daily., Disp: , Rfl:    bisacodyl (DULCOLAX) 5 MG EC tablet, Take 2 tablets (10 mg total) by mouth daily as needed for moderate constipation., Disp: 30 tablet, Rfl: 0   clotrimazole-betamethasone (LOTRISONE) cream, Apply 1 application topically 2 (two) times daily., Disp: 15 g, Rfl: 1   furosemide (LASIX) 40 MG tablet, Take 1 tablet (40 mg total) by mouth daily. (Patient taking differently: Take 40 mg by mouth daily as needed for fluid or edema.), Disp: 30 tablet, Rfl: 6   iron polysaccharides (NIFEREX) 150 MG capsule, Take 1 capsule (150 mg total) by mouth daily., Disp: 30 capsule, Rfl: 2   levothyroxine (SYNTHROID) 125 MCG tablet, Take 1 tablet by mouth once daily (Patient  taking differently: Take 125 mcg by mouth daily.), Disp: 90 tablet, Rfl: 0   melatonin 5 MG TABS, Take 1 tablet (5 mg total) by mouth at bedtime., Disp: 30 tablet, Rfl: 2   nitrofurantoin (MACRODANTIN) 100 MG capsule, Take 1 capsule (100 mg total) by mouth 2 (two) times daily., Disp: 20 capsule, Rfl: 0   omeprazole (PRILOSEC) 40 MG capsule, Take 1 capsule (40 mg total) by mouth daily., Disp: 180 capsule, Rfl: 2   polyethylene glycol (MIRALAX / GLYCOLAX) 17 g packet, Take 17 g by mouth daily., Disp: 14 each, Rfl: 0   pravastatin (PRAVACHOL) 40 MG tablet, TAKE 1 TABLET BY MOUTH AT BEDTIME, Disp: 90 tablet, Rfl: 0   vitamin B-12 100 MCG tablet, Take 1 tablet (100 mcg total) by mouth daily., Disp: 30 tablet, Rfl: 2    Allergies  Allergen Reactions   Gabapentin Other (See Comments)    Dizziness    Sulfa Antibiotics Other (See Comments)   Sulfonamide Derivatives     ROS Review of Systems  Respiratory:  Negative for cough and shortness of breath.   Endocrine: Negative for polyphagia.  Genitourinary:  Negative for flank pain.  Neurological:  Negative for dizziness and facial asymmetry.  Psychiatric/Behavioral:  Negative for agitation and behavioral problems.      Objective:    Physical Exam Musculoskeletal:       Arms:     Comments: Swelling of the left armAntibiotics Given (last 72 hours)    None        BP 101/72   Pulse 87   Ht 5\' 5"  (1.651 m)   Wt 153 lb 1.6 oz (69.4 kg)   BMI 25.48 kg/m  Wt Readings from Last 3 Encounters:  04/11/21 153 lb 1.6 oz (69.4 kg)  03/21/21 155 lb 9.6 oz (70.6 kg)  03/09/21 160 lb 4.4 oz (72.7 kg)     Health Maintenance Due  Topic Date Due   Zoster Vaccines- Shingrix (1 of 2) Never done   DEXA SCAN  Never done   PNA vac Low Risk Adult (2 of 2 - PCV13) 04/12/2016   COVID-19 Vaccine (4 - Booster for Pfizer series) 10/28/2020   INFLUENZA VACCINE  03/20/2021    There are no preventive care reminders to display for this patient.  Lab Results  Component Value Date   TSH 0.11 (L) 04/26/2020   Lab Results  Component Value Date   WBC 9.8 03/12/2021   HGB 11.4 (L) 03/12/2021   HCT 34.3 (L) 03/12/2021   MCV 90.0 03/12/2021   PLT 213 03/12/2021   Lab Results  Component Value Date   NA 136 03/12/2021   K 4.0 03/12/2021   CO2 29 03/12/2021   GLUCOSE 98 03/12/2021   BUN 11 03/12/2021   CREATININE 0.79 03/12/2021   BILITOT 0.6 04/26/2020   ALKPHOS 52 04/24/2019   AST 15 04/26/2020   ALT 8 04/26/2020   PROT 6.4 04/26/2020   ALBUMIN 4.1 04/24/2019   CALCIUM 8.7 (L) 03/12/2021   ANIONGAP 7 03/12/2021   Lab Results  Component Value Date   CHOL 182 04/26/2020   Lab Results  Component Value Date   HDL 71 04/26/2020   Lab Results   Component Value Date   LDLCALC 92 04/26/2020   Lab Results  Component Value Date   TRIG 97 04/26/2020   Lab Results  Component Value Date   CHOLHDL 2.6 04/26/2020   Lab Results  Component Value Date   HGBA1C 6.3 (H) 09/24/2016  Assessment & Plan:   Problem List Items Addressed This Visit       Cardiovascular and Mediastinum   Essential hypertension - Primary     Patient denies any chest pain or shortness of breath there is no history of palpitation or paroxysmal nocturnal dyspnea   patient was advised to follow low-salt low-cholesterol diet    ideally I want to keep systolic blood pressure below 132 mmHg, patient was asked to check blood pressure one times a week and give me a report on that.  Patient will be follow-up in 3 months  or earlier as needed, patient will call me back for any change in the cardiovascular symptoms Patient was advised to buy a book from local bookstore concerning blood pressure and read several chapters  every day.  This will be supplemented by some of the material we will give him from the office.  Patient should also utilize other resources like YouTube and Internet to learn more about the blood pressure and the diet.        Digestive   GERD (gastroesophageal reflux disease)     Musculoskeletal and Integument   Osteoarthritis of knee    Stable at the present time gabapentin as well as Lyrica        Genitourinary   Acute lower UTI    Patient does not have any fever or chills no urinary symptoms        Other   Memory loss    She is better Aricept was stopped     Patient has a swelling of the left arm I advised him to come back and see me next week for that..  It is due to infiltration of the fluid anddye left knee  In the left arm during the procedure.  It should resolve slowly.  Patient was reassured.  No orders of the defined types were placed in this encounter.   Follow-up: No follow-ups on file.    Corky Downs, MD

## 2021-04-14 DIAGNOSIS — J449 Chronic obstructive pulmonary disease, unspecified: Secondary | ICD-10-CM | POA: Diagnosis not present

## 2021-04-14 DIAGNOSIS — I340 Nonrheumatic mitral (valve) insufficiency: Secondary | ICD-10-CM | POA: Diagnosis not present

## 2021-04-14 DIAGNOSIS — R609 Edema, unspecified: Secondary | ICD-10-CM | POA: Diagnosis not present

## 2021-04-14 DIAGNOSIS — I10 Essential (primary) hypertension: Secondary | ICD-10-CM | POA: Diagnosis not present

## 2021-04-14 DIAGNOSIS — I441 Atrioventricular block, second degree: Secondary | ICD-10-CM | POA: Diagnosis not present

## 2021-04-19 ENCOUNTER — Other Ambulatory Visit: Payer: Self-pay

## 2021-04-19 ENCOUNTER — Encounter: Payer: Self-pay | Admitting: Internal Medicine

## 2021-04-19 ENCOUNTER — Ambulatory Visit (INDEPENDENT_AMBULATORY_CARE_PROVIDER_SITE_OTHER): Payer: Medicare Other | Admitting: Internal Medicine

## 2021-04-19 VITALS — BP 139/94 | HR 88 | Ht 65.0 in | Wt 153.0 lb

## 2021-04-19 DIAGNOSIS — I872 Venous insufficiency (chronic) (peripheral): Secondary | ICD-10-CM

## 2021-04-19 DIAGNOSIS — M47812 Spondylosis without myelopathy or radiculopathy, cervical region: Secondary | ICD-10-CM

## 2021-04-19 DIAGNOSIS — I10 Essential (primary) hypertension: Secondary | ICD-10-CM | POA: Diagnosis not present

## 2021-04-19 DIAGNOSIS — E039 Hypothyroidism, unspecified: Secondary | ICD-10-CM | POA: Diagnosis not present

## 2021-04-19 DIAGNOSIS — N390 Urinary tract infection, site not specified: Secondary | ICD-10-CM | POA: Diagnosis not present

## 2021-04-19 DIAGNOSIS — K219 Gastro-esophageal reflux disease without esophagitis: Secondary | ICD-10-CM | POA: Diagnosis not present

## 2021-04-19 DIAGNOSIS — R600 Localized edema: Secondary | ICD-10-CM

## 2021-04-19 DIAGNOSIS — J441 Chronic obstructive pulmonary disease with (acute) exacerbation: Secondary | ICD-10-CM | POA: Diagnosis not present

## 2021-04-19 LAB — POCT URINALYSIS DIPSTICK
Bilirubin, UA: NEGATIVE
Blood, UA: NEGATIVE
Glucose, UA: NEGATIVE
Ketones, UA: NEGATIVE
Nitrite, UA: POSITIVE
Protein, UA: POSITIVE — AB
Spec Grav, UA: 1.025 (ref 1.010–1.025)
Urobilinogen, UA: 0.2 E.U./dL
pH, UA: 6 (ref 5.0–8.0)

## 2021-04-19 MED ORDER — TRIMETHOPRIM 100 MG PO TABS: 100.0000 mg | ORAL_TABLET | Freq: Two times a day (BID) | ORAL | 0 refills | Status: DC

## 2021-04-19 MED ORDER — NITROFURANTOIN MACROCRYSTAL 25 MG PO CAPS: 25.0000 mg | ORAL_CAPSULE | Freq: Four times a day (QID) | ORAL | 0 refills | Status: DC

## 2021-04-19 MED ORDER — METHYLPREDNISOLONE 4 MG PO TBPK: ORAL_TABLET | ORAL | 0 refills | Status: DC

## 2021-04-19 NOTE — Assessment & Plan Note (Signed)
Started on Macrodantin

## 2021-04-19 NOTE — Progress Notes (Signed)
Established Patient Office Visit  Subjective:  Patient ID: Abigail Strong, female    DOB: 07/08/1930  Age: 87 y.o. MRN: [id number]  CC:  Chief Complaint  Patient presents with   Urinary Tract Infection    Urinary Tract Infection  Associated symptoms include urgency.   Abigail Strong presents ffor neck pain  Past Medical History:  Diagnosis Date   Depression    GERD (gastroesophageal reflux disease)    Hyperlipidemia    Hyperlipidemia, unspecified 01/01/2014   Hypothyroidism    Insomnia    Musculoskeletal chest pain 09/27/2016    Past Surgical History:  Procedure Laterality Date   ABDOMINAL HYSTERECTOMY     BREAST BIOPSY Bilateral 90's   benign   cataracts     COLON SURGERY     partial colectomy   intestinal blockage     NISSEN FUNDOPLICATION      Family History  Problem Relation Age of Onset   Cervical cancer Mother    Breast cancer Maternal Aunt     Social History   Socioeconomic History   Marital status: Married    Spouse name: Not on file   Number of children: Not on file   Years of education: Not on file   Highest education level: Not on file  Occupational History   Not on file  Tobacco Use   Smoking status: Never   Smokeless tobacco: Never  Substance and Sexual Activity   Alcohol use: No   Drug use: No   Sexual activity: Not Currently    Birth control/protection: Post-menopausal  Other Topics Concern   Not on file  Social History Narrative   Not on file   Social Determinants of Health   Financial Resource Strain: Not on file  Food Insecurity: Not on file  Transportation Needs: Not on file  Physical Activity: Not on file  Stress: Not on file  Social Connections: Not on file  Intimate Partner Violence: Not on file     Current Outpatient Medications:    acetaminophen (TYLENOL) 500 MG tablet, Take 1,000 mg by mouth every 6 (six) hours as needed for mild pain or moderate pain., Disp: , Rfl:    albuterol (PROVENTIL) (2.5 MG/3ML)  0.083% nebulizer solution, USE 1 VIAL IN NEBULIZER ONCE DAILY AS NEEDED (Patient taking differently: Take 2.5 mg by nebulization daily as needed for wheezing or shortness of breath.), Disp: 75 mL, Rfl: 0   aspirin 81 MG chewable tablet, Chew 81 mg by mouth daily., Disp: , Rfl:    bisacodyl (DULCOLAX) 5 MG EC tablet, Take 2 tablets (10 mg total) by mouth daily as needed for moderate constipation., Disp: 30 tablet, Rfl: 0   clotrimazole-betamethasone (LOTRISONE) cream, Apply 1 application topically 2 (two) times daily., Disp: 15 g, Rfl: 1   furosemide (LASIX) 40 MG tablet, Take 1 tablet (40 mg total) by mouth daily. (Patient taking differently: Take 40 mg by mouth daily as needed for fluid or edema.), Disp: 30 tablet, Rfl: 6   iron polysaccharides (NIFEREX) 150 MG capsule, Take 1 capsule (150 mg total) by mouth daily., Disp: 30 capsule, Rfl: 2   levothyroxine (SYNTHROID) 125 MCG tablet, Take 1 tablet by mouth once daily (Patient taking differently: Take 125 mcg by mouth daily.), Disp: 90 tablet, Rfl: 0   melatonin 5 MG TABS, Take 1 tablet (5 mg total) by mouth at bedtime., Disp: 30 tablet, Rfl: 2   methylPREDNISolone (MEDROL DOSEPAK) 4 MG TBPK tablet, Medrol Dosepak 4 mg as directed, Disp:  21 tablet, Rfl: 0   nitrofurantoin (MACRODANTIN) 100 MG capsule, Take 1 capsule (100 mg total) by mouth 2 (two) times daily., Disp: 20 capsule, Rfl: 0   nitrofurantoin (MACRODANTIN) 25 MG capsule, Take 1 capsule (25 mg total) by mouth 4 (four) times daily., Disp: 20 capsule, Rfl: 0   omeprazole (PRILOSEC) 40 MG capsule, Take 1 capsule (40 mg total) by mouth daily., Disp: 180 capsule, Rfl: 2   polyethylene glycol (MIRALAX / GLYCOLAX) 17 g packet, Take 17 g by mouth daily., Disp: 14 each, Rfl: 0   pravastatin (PRAVACHOL) 40 MG tablet, TAKE 1 TABLET BY MOUTH AT BEDTIME, Disp: 90 tablet, Rfl: 0   vitamin B-12 100 MCG tablet, Take 1 tablet (100 mcg total) by mouth daily., Disp: 30 tablet, Rfl: 2   Allergies  Allergen  Reactions   Gabapentin Other (See Comments)    Dizziness    Sulfa Antibiotics Other (See Comments)   Sulfonamide Derivatives     ROS Review of Systems  Constitutional: Negative.   HENT: Negative.    Eyes: Negative.   Respiratory:  Positive for shortness of breath. Negative for chest tightness.   Cardiovascular: Negative.  Negative for chest pain.  Gastrointestinal: Negative.   Endocrine: Negative.   Genitourinary:  Positive for urgency.  Musculoskeletal: Negative.   Skin: Negative.   Allergic/Immunologic: Negative.   Neurological:  Positive for dizziness and headaches.  Hematological: Negative.   Psychiatric/Behavioral: Negative.    All other systems reviewed and are negative.    Objective:    Physical Exam Vitals reviewed.  Constitutional:      Appearance: Normal appearance.  HENT:     Mouth/Throat:     Mouth: Mucous membranes are moist.  Eyes:     Pupils: Pupils are equal, round, and reactive to light.  Neck:     Vascular: No carotid bruit.  Cardiovascular:     Rate and Rhythm: Normal rate and regular rhythm.     Pulses: Normal pulses.     Heart sounds: Normal heart sounds.  Pulmonary:     Effort: Pulmonary effort is normal.     Breath sounds: Normal breath sounds.  Abdominal:     General: Bowel sounds are normal.     Palpations: Abdomen is soft. There is no hepatomegaly, splenomegaly or mass.     Tenderness: There is no abdominal tenderness.     Hernia: No hernia is present.  Musculoskeletal:        General: No swelling or tenderness.     Cervical back: Neck supple.     Right lower leg: No edema.     Left lower leg: No edema.  Skin:    Findings: No rash.  Neurological:     General: No focal deficit present.     Mental Status: She is alert and oriented to person, place, and time.     Motor: No weakness.  Psychiatric:        Mood and Affect: Mood and affect normal.        Behavior: Behavior normal.    BP (!) 139/94   Pulse 88   Ht 5\' 5"  (1.651 m)    Wt 153 lb (69.4 kg)   BMI 25.46 kg/m  Wt Readings from Last 3 Encounters:  04/19/21 153 lb (69.4 kg)  04/11/21 153 lb 1.6 oz (69.4 kg)  03/21/21 155 lb 9.6 oz (70.6 kg)     Health Maintenance Due  Topic Date Due   Zoster Vaccines- Shingrix (1 of 2) Never  done   DEXA SCAN  Never done   PNA vac Low Risk Adult (2 of 2 - PCV13) 04/12/2016   COVID-19 Vaccine (4 - Booster for Pfizer series) 10/28/2020   INFLUENZA VACCINE  03/20/2021    There are no preventive care reminders to display for this patient.  Lab Results  Component Value Date   TSH 0.11 (L) 04/26/2020   Lab Results  Component Value Date   WBC 9.8 03/12/2021   HGB 11.4 (L) 03/12/2021   HCT 34.3 (L) 03/12/2021   MCV 90.0 03/12/2021   PLT 213 03/12/2021   Lab Results  Component Value Date   NA 136 03/12/2021   K 4.0 03/12/2021   CO2 29 03/12/2021   GLUCOSE 98 03/12/2021   BUN 11 03/12/2021   CREATININE 0.79 03/12/2021   BILITOT 0.6 04/26/2020   ALKPHOS 52 04/24/2019   AST 15 04/26/2020   ALT 8 04/26/2020   PROT 6.4 04/26/2020   ALBUMIN 4.1 04/24/2019   CALCIUM 8.7 (L) 03/12/2021   ANIONGAP 7 03/12/2021   Lab Results  Component Value Date   CHOL 182 04/26/2020   Lab Results  Component Value Date   HDL 71 04/26/2020   Lab Results  Component Value Date   LDLCALC 92 04/26/2020   Lab Results  Component Value Date   TRIG 97 04/26/2020   Lab Results  Component Value Date   CHOLHDL 2.6 04/26/2020   Lab Results  Component Value Date   HGBA1C 6.3 (H) 09/24/2016      Assessment & Plan:   Problem List Items Addressed This Visit       Cardiovascular and Mediastinum   Essential hypertension     Patient denies any chest pain or shortness of breath there is no history of palpitation or paroxysmal nocturnal dyspnea   patient was advised to follow low-salt low-cholesterol diet    ideally I want to keep systolic blood pressure below 147 mmHg, patient was asked to check blood pressure one times a  week and give me a report on that.  Patient will be follow-up in 3 months  or earlier as needed, patient will call me back for any change in the cardiovascular symptoms Patient was advised to buy a book from local bookstore concerning blood pressure and read several chapters  every day.  This will be supplemented by some of the material we will give him from the office.  Patient should also utilize other resources like YouTube and Internet to learn more about the blood pressure and the diet.      Edema of lower leg due to peripheral venous insufficiency    Stable        Respiratory   COPD exacerbation (HCC)    Stable at the present time      Relevant Medications   methylPREDNISolone (MEDROL DOSEPAK) 4 MG TBPK tablet     Digestive   GERD (gastroesophageal reflux disease)    - The patient's GERD is stable on medication.  - Instructed the patient to avoid eating spicy and acidic foods, as well as foods high in fat. - Instructed the patient to avoid eating large meals or meals 2-3 hours prior to sleeping.        Endocrine   Hypothyroidism, unspecified    Advised to take her medicine in the morning before breakfast        Musculoskeletal and Integument   Cervical spine arthritis    Use neck brace, patient was started on steroids  Relevant Medications   methylPREDNISolone (MEDROL DOSEPAK) 4 MG TBPK tablet     Genitourinary   Urinary tract infection without hematuria - Primary    Started on Macrodantin      Relevant Medications   methylPREDNISolone (MEDROL DOSEPAK) 4 MG TBPK tablet   nitrofurantoin (MACRODANTIN) 25 MG capsule   Other Relevant Orders   POCT urinalysis dipstick (Completed)    Meds ordered this encounter  Medications   methylPREDNISolone (MEDROL DOSEPAK) 4 MG TBPK tablet    Sig: Medrol Dosepak 4 mg as directed    Dispense:  21 tablet    Refill:  0   nitrofurantoin (MACRODANTIN) 25 MG capsule    Sig: Take 1 capsule (25 mg total) by mouth 4 (four)  times daily.    Dispense:  20 capsule    Refill:  0    Follow-up: No follow-ups on file.    Corky Downs, MD

## 2021-04-19 NOTE — Addendum Note (Signed)
Addended by: Alois Cliche on: 04/19/2021 04:22 PM   Modules accepted: Orders

## 2021-04-19 NOTE — Assessment & Plan Note (Signed)
-   The patient's GERD is stable on medication.  - Instructed the patient to avoid eating spicy and acidic foods, as well as foods high in fat. - Instructed the patient to avoid eating large meals or meals 2-3 hours prior to sleeping. 

## 2021-04-19 NOTE — Assessment & Plan Note (Signed)
Stable at the present time. 

## 2021-04-19 NOTE — Assessment & Plan Note (Signed)

## 2021-04-19 NOTE — Assessment & Plan Note (Signed)
Advised to take her medicine in the morning before breakfast

## 2021-04-19 NOTE — Assessment & Plan Note (Signed)
Stable

## 2021-04-19 NOTE — Assessment & Plan Note (Addendum)
Use neck brace, patient was started on steroids

## 2021-04-27 DIAGNOSIS — H35373 Puckering of macula, bilateral: Secondary | ICD-10-CM | POA: Diagnosis not present

## 2021-04-28 ENCOUNTER — Other Ambulatory Visit: Payer: Self-pay

## 2021-04-28 MED ORDER — OMEPRAZOLE 40 MG PO CPDR: 40.0000 mg | DELAYED_RELEASE_CAPSULE | Freq: Every day | ORAL | 2 refills | Status: DC

## 2021-04-29 ENCOUNTER — Other Ambulatory Visit: Payer: Self-pay | Admitting: Internal Medicine

## 2021-05-02 ENCOUNTER — Other Ambulatory Visit: Payer: Self-pay

## 2021-05-02 ENCOUNTER — Ambulatory Visit (INDEPENDENT_AMBULATORY_CARE_PROVIDER_SITE_OTHER): Payer: Medicare Other | Admitting: Internal Medicine

## 2021-05-02 ENCOUNTER — Encounter: Payer: Self-pay | Admitting: Internal Medicine

## 2021-05-02 VITALS — BP 134/89 | HR 92 | Ht 65.0 in | Wt 148.1 lb

## 2021-05-02 DIAGNOSIS — K5792 Diverticulitis of intestine, part unspecified, without perforation or abscess without bleeding: Secondary | ICD-10-CM | POA: Diagnosis not present

## 2021-05-02 DIAGNOSIS — M25511 Pain in right shoulder: Secondary | ICD-10-CM

## 2021-05-02 DIAGNOSIS — N390 Urinary tract infection, site not specified: Secondary | ICD-10-CM

## 2021-05-02 DIAGNOSIS — J42 Unspecified chronic bronchitis: Secondary | ICD-10-CM | POA: Diagnosis not present

## 2021-05-02 DIAGNOSIS — R7303 Prediabetes: Secondary | ICD-10-CM

## 2021-05-02 LAB — POCT URINALYSIS DIPSTICK
Appearance: NORMAL
Bilirubin, UA: NEGATIVE
Blood, UA: POSITIVE
Clarity, UA: NORMAL
Glucose, UA: NEGATIVE
Nitrite, UA: NEGATIVE
Protein, UA: POSITIVE — AB
Spec Grav, UA: 1.025 (ref 1.010–1.025)
Urobilinogen, UA: NEGATIVE E.U./dL — AB
pH, UA: 6 (ref 5.0–8.0)

## 2021-05-02 MED ORDER — BUDESONIDE-FORMOTEROL FUMARATE 160-4.5 MCG/ACT IN AERO: 2.0000 | INHALATION_SPRAY | Freq: Two times a day (BID) | RESPIRATORY_TRACT | 3 refills | Status: DC

## 2021-05-02 MED ORDER — TRIMETHOPRIM 100 MG PO TABS: 100.0000 mg | ORAL_TABLET | Freq: Two times a day (BID) | ORAL | 0 refills | Status: DC

## 2021-05-02 NOTE — Progress Notes (Signed)
Established Patient Office Visit  Subjective:  Patient ID: Abigail Strong, female    DOB: Oct 02, 1929  Age: 87 y.o. MRN: [id number]  CC:  Chief Complaint  Patient presents with   Urinary Frequency    Urinary Frequency  Associated symptoms include frequency.   Abigail Strong presents for polyurea  Past Medical History:  Diagnosis Date   Depression    GERD (gastroesophageal reflux disease)    Hyperlipidemia    Hyperlipidemia, unspecified 01/01/2014   Hypothyroidism    Insomnia    Musculoskeletal chest pain 09/27/2016    Past Surgical History:  Procedure Laterality Date   ABDOMINAL HYSTERECTOMY     BREAST BIOPSY Bilateral 90's   benign   cataracts     COLON SURGERY     partial colectomy   intestinal blockage     NISSEN FUNDOPLICATION      Family History  Problem Relation Age of Onset   Cervical cancer Mother    Breast cancer Maternal Aunt     Social History   Socioeconomic History   Marital status: Married    Spouse name: Not on file   Number of children: Not on file   Years of education: Not on file   Highest education level: Not on file  Occupational History   Not on file  Tobacco Use   Smoking status: Never   Smokeless tobacco: Never  Substance and Sexual Activity   Alcohol use: No   Drug use: No   Sexual activity: Not Currently    Birth control/protection: Post-menopausal  Other Topics Concern   Not on file  Social History Narrative   Not on file   Social Determinants of Health   Financial Resource Strain: Not on file  Food Insecurity: Not on file  Transportation Needs: Not on file  Physical Activity: Not on file  Stress: Not on file  Social Connections: Not on file  Intimate Partner Violence: Not on file     Current Outpatient Medications:    trimethoprim (TRIMPEX) 100 MG tablet, Take 1 tablet (100 mg total) by mouth 2 (two) times daily., Disp: 10 tablet, Rfl: 0   acetaminophen (TYLENOL) 500 MG tablet, Take 1,000 mg by mouth  every 6 (six) hours as needed for mild pain or moderate pain., Disp: , Rfl:    albuterol (PROVENTIL) (2.5 MG/3ML) 0.083% nebulizer solution, USE 1 VIAL IN NEBULIZER ONCE DAILY AS NEEDED (Patient taking differently: Take 2.5 mg by nebulization daily as needed for wheezing or shortness of breath.), Disp: 75 mL, Rfl: 0   aspirin 81 MG chewable tablet, Chew 81 mg by mouth daily., Disp: , Rfl:    bisacodyl (DULCOLAX) 5 MG EC tablet, Take 2 tablets (10 mg total) by mouth daily as needed for moderate constipation., Disp: 30 tablet, Rfl: 0   clopidogrel (PLAVIX) 75 MG tablet, Take 1 tablet by mouth once daily, Disp: 90 tablet, Rfl: 0   clotrimazole-betamethasone (LOTRISONE) cream, Apply 1 application topically 2 (two) times daily., Disp: 15 g, Rfl: 1   furosemide (LASIX) 40 MG tablet, Take 1 tablet (40 mg total) by mouth daily. (Patient taking differently: Take 40 mg by mouth daily as needed for fluid or edema.), Disp: 30 tablet, Rfl: 6   iron polysaccharides (NIFEREX) 150 MG capsule, Take 1 capsule (150 mg total) by mouth daily., Disp: 30 capsule, Rfl: 2   levothyroxine (SYNTHROID) 125 MCG tablet, Take 1 tablet by mouth once daily (Patient taking differently: Take 125 mcg by mouth daily.), Disp: 90  tablet, Rfl: 0   melatonin 5 MG TABS, Take 1 tablet (5 mg total) by mouth at bedtime., Disp: 30 tablet, Rfl: 2   methylPREDNISolone (MEDROL DOSEPAK) 4 MG TBPK tablet, Medrol Dosepak 4 mg as directed, Disp: 21 tablet, Rfl: 0   nitrofurantoin (MACRODANTIN) 100 MG capsule, Take 1 capsule (100 mg total) by mouth 2 (two) times daily., Disp: 20 capsule, Rfl: 0   nitrofurantoin (MACRODANTIN) 25 MG capsule, Take 1 capsule (25 mg total) by mouth 4 (four) times daily., Disp: 20 capsule, Rfl: 0   omeprazole (PRILOSEC) 40 MG capsule, Take 1 capsule (40 mg total) by mouth daily., Disp: 180 capsule, Rfl: 2   polyethylene glycol (MIRALAX / GLYCOLAX) 17 g packet, Take 17 g by mouth daily., Disp: 14 each, Rfl: 0   pravastatin  (PRAVACHOL) 40 MG tablet, TAKE 1 TABLET BY MOUTH AT BEDTIME, Disp: 90 tablet, Rfl: 0   trimethoprim (TRIMPEX) 100 MG tablet, Take 1 tablet (100 mg total) by mouth 2 (two) times daily., Disp: 14 tablet, Rfl: 0   vitamin B-12 100 MCG tablet, Take 1 tablet (100 mcg total) by mouth daily., Disp: 30 tablet, Rfl: 2   Allergies  Allergen Reactions   Gabapentin Other (See Comments)    Dizziness    Sulfa Antibiotics Other (See Comments)   Sulfonamide Derivatives     ROS Review of Systems  Constitutional: Negative.   HENT: Negative.    Eyes: Negative.   Respiratory: Negative.    Cardiovascular: Negative.   Gastrointestinal: Negative.   Endocrine: Negative.   Genitourinary:  Positive for frequency.  Musculoskeletal: Negative.   Skin: Negative.   Allergic/Immunologic: Negative.   Neurological: Negative.   Hematological: Negative.   Psychiatric/Behavioral: Negative.    All other systems reviewed and are negative.    Objective:    Physical Exam  BP 134/89   Pulse 92   Ht 5\' 5"  (1.651 m)   Wt 148 lb 1.6 oz (67.2 kg)   BMI 24.65 kg/m  Wt Readings from Last 3 Encounters:  05/02/21 148 lb 1.6 oz (67.2 kg)  04/19/21 153 lb (69.4 kg)  04/11/21 153 lb 1.6 oz (69.4 kg)     Health Maintenance Due  Topic Date Due   Zoster Vaccines- Shingrix (1 of 2) Never done   DEXA SCAN  Never done   PNA vac Low Risk Adult (2 of 2 - PCV13) 04/12/2016   COVID-19 Vaccine (4 - Booster for Pfizer series) 10/28/2020   INFLUENZA VACCINE  03/20/2021    There are no preventive care reminders to display for this patient.  Lab Results  Component Value Date   TSH 0.11 (L) 04/26/2020   Lab Results  Component Value Date   WBC 9.8 03/12/2021   HGB 11.4 (L) 03/12/2021   HCT 34.3 (L) 03/12/2021   MCV 90.0 03/12/2021   PLT 213 03/12/2021   Lab Results  Component Value Date   NA 136 03/12/2021   K 4.0 03/12/2021   CO2 29 03/12/2021   GLUCOSE 98 03/12/2021   BUN 11 03/12/2021   CREATININE 0.79  03/12/2021   BILITOT 0.6 04/26/2020   ALKPHOS 52 04/24/2019   AST 15 04/26/2020   ALT 8 04/26/2020   PROT 6.4 04/26/2020   ALBUMIN 4.1 04/24/2019   CALCIUM 8.7 (L) 03/12/2021   ANIONGAP 7 03/12/2021   Lab Results  Component Value Date   CHOL 182 04/26/2020   Lab Results  Component Value Date   HDL 71 04/26/2020   Lab Results  Component Value Date   LDLCALC 92 04/26/2020   Lab Results  Component Value Date   TRIG 97 04/26/2020   Lab Results  Component Value Date   CHOLHDL 2.6 04/26/2020   Lab Results  Component Value Date   HGBA1C 6.3 (H) 09/24/2016      Assessment & Plan:   Problem List Items Addressed This Visit       Respiratory   Chronic bronchitis (HCC)     Genitourinary   Urinary tract infection without hematuria - Primary   Relevant Medications   trimethoprim (TRIMPEX) 100 MG tablet   Other Relevant Orders   POCT urinalysis dipstick (Completed)   Ambulatory referral to Urology     Other   Diverticulitis    Stable Symptoms resolved      Prediabetes    Blood sugar is under control      Right shoulder pain    better       Meds ordered this encounter  Medications   trimethoprim (TRIMPEX) 100 MG tablet    Sig: Take 1 tablet (100 mg total) by mouth 2 (two) times daily.    Dispense:  10 tablet    Refill:  0    Follow-up: No follow-ups on file.    Corky Downs, MD

## 2021-05-02 NOTE — Assessment & Plan Note (Signed)
Stable Symptoms resolved

## 2021-05-02 NOTE — Assessment & Plan Note (Signed)
better 

## 2021-05-02 NOTE — Assessment & Plan Note (Signed)
Blood sugar is under control ?

## 2021-05-04 ENCOUNTER — Ambulatory Visit (INDEPENDENT_AMBULATORY_CARE_PROVIDER_SITE_OTHER): Payer: Medicare Other | Admitting: *Deleted

## 2021-05-04 ENCOUNTER — Other Ambulatory Visit: Payer: Self-pay | Admitting: *Deleted

## 2021-05-04 DIAGNOSIS — Z0000 Encounter for general adult medical examination without abnormal findings: Secondary | ICD-10-CM

## 2021-05-04 DIAGNOSIS — R053 Chronic cough: Secondary | ICD-10-CM | POA: Diagnosis not present

## 2021-05-04 MED ORDER — OMEPRAZOLE 40 MG PO CPDR: 40.0000 mg | DELAYED_RELEASE_CAPSULE | Freq: Every day | ORAL | 2 refills | Status: DC

## 2021-05-04 NOTE — Progress Notes (Signed)
Subjective:   Abigail Strong is a 87 y.o. female who presents for Medicare Annual (Subsequent) preventive examination.  I discussed the limitations of evaluation and management by telemedicine and the availability of in person apts. The patient expressed understanding and agreed to proceed.   Visit performed using audio  Patient:home Provider:home   Review of Systems    Defer to provider  Cardiac Risk Factors include: advanced age (>51mn, >>79women)     Objective:    Today's Vitals   05/04/21 1110  PainSc: 0-No pain   There is no height or weight on file to calculate BMI.  Advanced Directives 05/04/2021 03/09/2021 04/25/2019 04/24/2019 09/28/2018 09/28/2018 03/28/2018  Does Patient Have a Medical Advance Directive? No No No No Yes Yes No  Type of Advance Directive - - - - Living will;Healthcare Power of AOthoLiving will -  Does patient want to make changes to medical advance directive? - - - - No - Patient declined - -  [place] of [place]? - - - - No - copy requested - -  Would patient like information on creating a medical advance directive? No - Patient declined No - Patient declined No - Patient declined No - Patient declined - - -    Current Medications (verified) Outpatient Encounter Medications as of 05/04/2021  Medication Sig   acetaminophen (TYLENOL) 500 MG tablet Take 1,000 mg by mouth every 6 (six) hours as needed for mild pain or moderate pain.   albuterol (PROVENTIL) (2.5 MG/3ML) 0.083% nebulizer solution USE 1 VIAL IN NEBULIZER ONCE DAILY AS NEEDED (Patient taking differently: Take 2.5 mg by nebulization daily as needed for wheezing or shortness of breath.)   aspirin 81 MG chewable tablet Chew 81 mg by mouth daily.   bisacodyl (DULCOLAX) 5 MG EC tablet Take 2 tablets (10 mg total) by mouth daily as needed for moderate constipation.   budesonide-formoterol (SYMBICORT) 160-4.5 MCG/ACT inhaler Inhale 2 puffs into  the lungs 2 (two) times daily.   clopidogrel (PLAVIX) 75 MG tablet Take 1 tablet by mouth once daily   clotrimazole-betamethasone (LOTRISONE) cream Apply 1 application topically 2 (two) times daily.   furosemide (LASIX) 40 MG tablet Take 1 tablet (40 mg total) by mouth daily. (Patient taking differently: Take 40 mg by mouth daily as needed for fluid or edema.)   iron polysaccharides (NIFEREX) 150 MG capsule Take 1 capsule (150 mg total) by mouth daily.   levothyroxine (SYNTHROID) 125 MCG tablet Take 1 tablet by mouth once daily (Patient taking differently: Take 125 mcg by mouth daily.)   melatonin 5 MG TABS Take 1 tablet (5 mg total) by mouth at bedtime.   methylPREDNISolone (MEDROL DOSEPAK) 4 MG TBPK tablet Medrol Dosepak 4 mg as directed   nitrofurantoin (MACRODANTIN) 100 MG capsule Take 1 capsule (100 mg total) by mouth 2 (two) times daily.   nitrofurantoin (MACRODANTIN) 25 MG capsule Take 1 capsule (25 mg total) by mouth 4 (four) times daily.   omeprazole (PRILOSEC) 40 MG capsule Take 1 capsule (40 mg total) by mouth daily.   polyethylene glycol (MIRALAX / GLYCOLAX) 17 g packet Take 17 g by mouth daily.   pravastatin (PRAVACHOL) 40 MG tablet TAKE 1 TABLET BY MOUTH AT BEDTIME   trimethoprim (TRIMPEX) 100 MG tablet Take 1 tablet (100 mg total) by mouth 2 (two) times daily.   trimethoprim (TRIMPEX) 100 MG tablet Take 1 tablet (100 mg total) by mouth 2 (two) times daily.  vitamin B-12 100 MCG tablet Take 1 tablet (100 mcg total) by mouth daily.   No facility-administered encounter medications on file as of 05/04/2021.    Allergies (verified) Gabapentin, Sulfa antibiotics, and Sulfonamide derivatives   History: Past Medical History:  Diagnosis Date   Depression    GERD (gastroesophageal reflux disease)    Hyperlipidemia    Hyperlipidemia, unspecified 01/01/2014   Hypothyroidism    Insomnia    Musculoskeletal chest pain 09/27/2016   Past Surgical History:  Procedure Laterality Date    ABDOMINAL HYSTERECTOMY     BREAST BIOPSY Bilateral 90's   benign   cataracts     COLON SURGERY     partial colectomy   intestinal blockage     NISSEN FUNDOPLICATION     Family History  Problem Relation Age of Onset   Cervical cancer Mother    Breast cancer Maternal Aunt    Social History   Socioeconomic History   Marital status: Married    Spouse name: Not on file   Number of children: Not on file   Years of education: Not on file   Highest education level: Not on file  Occupational History   Not on file  Tobacco Use   Smoking status: Never   Smokeless tobacco: Never  Substance and Sexual Activity   Alcohol use: No   Drug use: No   Sexual activity: Not Currently    Birth control/protection: Post-menopausal  Other Topics Concern   Not on file  Social History Narrative   Not on file   Social Determinants of Health   Financial Resource Strain: High Risk   Difficulty of Paying Living Expenses: Very hard  Food Insecurity: No Food Insecurity   Worried About Charity fundraiser in the Last Year: Never true   Ran Out of Food in the Last Year: Never true  Transportation Needs: No Transportation Needs   Lack of Transportation (Medical): No   Lack of Transportation (Non-Medical): No  Physical Activity: Inactive   Days of Exercise per Week: 0 days   Minutes of Exercise per Session: 0 min  Stress: No Stress Concern Present   Feeling of Stress : Only a little  Social Connections: Moderately Integrated   Frequency of Communication with Friends and Family: More than three times a week   Frequency of Social Gatherings with Friends and Family: More than three times a week   Attends Religious Services: 1 to 4 times per year   Active Member of [employer] or Organizations: Yes   Attends Archivist Meetings: 1 to 4 times per year   Marital Status: Widowed    Tobacco Counseling Counseling given: Not Answered   Clinical Intake:  Pre-visit preparation completed:  Yes  Pain : No/denies pain Pain Score: 0-No pain     Nutritional Risks: None Diabetes: No  How often do you need to have someone help you when you read instructions, pamphlets, or other written materials from your doctor or pharmacy?: 1 - Never What is the last grade level you completed in school?: 9th  Diabetic?NO  Interpreter Needed?: No  Information entered by :: Lacretia Nicks CMA   Activities of Daily Living In your present state of health, do you have any difficulty performing the following activities: 05/04/2021 03/09/2021  Hearing? Y N  Comment somewhat difficult -  Vision? N N  Difficulty concentrating or making decisions? N N  Walking or climbing stairs? Y Y  Comment some impared mobility -  Dressing or  bathing? N N  Doing errands, shopping? N Y  Conservation officer, nature and eating ? Y -  Comment has meals on wheels oncer per day and son cooks in the evenings -  Using the Toilet? N -  In the past six months, have you accidently leaked urine? N -  Do you have problems with loss of bowel control? N -  Managing your Medications? N -  Managing your Finances? N -  Housekeeping or managing your Housekeeping? N -  Some recent data might be hidden    Patient Care Team: Cletis Athens, MD as PCP - General (Internal Medicine) Kate Sable, MD as PCP - Cardiology (Cardiology)  Indicate any recent Medical Services you may have received from other than [facility] providers in the past year (date may be approximate).     Assessment:   This is a routine wellness examination for [place].  Hearing/Vision screen No results found.  Dietary issues and exercise activities discussed: Current Exercise Habits: The patient does not participate in regular exercise at present, Exercise limited by: None identified   Goals Addressed   None    Depression Screen PHQ 2/9 Scores 05/04/2021 05/02/2021 04/26/2020  PHQ - 2 Score 0 0 0    Fall Risk Fall Risk  05/04/2021 03/21/2021 04/26/2020  04/26/2020  Falls in the past year? 0 '1 1 1  '$ Number falls in past yr: '1 1 1 '$ 0  Injury with Fall? 1 1 0 0  Risk for fall due to : History of fall(s) History of fall(s) History of fall(s) -  Follow up Falls evaluation completed Falls evaluation completed - -    FALL RISK PREVENTION PERTAINING TO THE HOME:  Any stairs in or around the home? No  If so, are there any without handrails? No  Home free of loose throw rugs in walkways, pet beds, electrical cords, etc? Yes  Adequate lighting in your home to reduce risk of falls? Yes   ASSISTIVE DEVICES UTILIZED TO PREVENT FALLS:  Life alert? No  Use of a cane, walker or w/c? Yes  Grab bars in the bathroom? Yes  Shower chair or bench in shower? Yes  Elevated toilet seat or a handicapped toilet? No   TIMED UP AND GO:  Was the test performed? No .  Length of time to ambulate : NA Gait slow and steady with assistive device  Cognitive Function: MMSE - Mini Mental State Exam 05/04/2021  Orientation to time 5  Orientation to Place 5  Registration 3  Attention/ Calculation 5  Recall 3  Language- name 2 objects 2  Language- repeat 1  Language- follow 3 step command 3  Language- read & follow direction 1  Write a sentence 1  Copy design 0  Total score 29     6CIT Screen 05/04/2021  What Year? 0 points  What month? 0 points  What time? 0 points  Count back from 20 0 points  Months in reverse 2 points  Repeat phrase 0 points  Total Score 2    Immunizations Immunization History  Administered Date(s) Administered   Fluad Quad(high Dose 65+) 04/26/2020   Influenza Whole 04/25/2009   PFIZER(Purple Top)SARS-COV-2 Vaccination 11/05/2019, 11/26/2019, 06/30/2020   Pneumococcal Polysaccharide-23 04/13/2015   Tdap 01/19/2021    TDAP status: Due, Education has been provided regarding the importance of this vaccine. Advised may receive this vaccine at local pharmacy or Health Dept. Aware to provide a copy of the vaccination record if  obtained from local pharmacy or Health  Dept. Verbalized acceptance and understanding.  Flu Vaccine status: Due, Education has been provided regarding the importance of this vaccine. Advised may receive this vaccine at local pharmacy or Health Dept. Aware to provide a copy of the vaccination record if obtained from local pharmacy or Health Dept. Verbalized acceptance and understanding.  Pneumococcal vaccine status: Due, Education has been provided regarding the importance of this vaccine. Advised may receive this vaccine at local pharmacy or Health Dept. Aware to provide a copy of the vaccination record if obtained from local pharmacy or Health Dept. Verbalized acceptance and understanding.  Covid-19 vaccine status: Completed vaccines  Qualifies for Shingles Vaccine? Yes   Zostavax completed No   Shingrix Completed?: No.    Education has been provided regarding the importance of this vaccine. Patient has been advised to call insurance company to determine out of pocket expense if they have not yet received this vaccine. Advised may also receive vaccine at local pharmacy or Health Dept. Verbalized acceptance and understanding.  Screening Tests Health Maintenance  Topic Date Due   Zoster Vaccines- Shingrix (1 of 2) Never done   DEXA SCAN  Never done   PNA vac Low Risk Adult (2 of 2 - PCV13) 04/12/2016   COVID-19 Vaccine (4 - Booster for Pfizer series) 10/28/2020   INFLUENZA VACCINE  03/20/2021   TETANUS/TDAP  01/20/2031   HPV VACCINES  Aged Out    Health Maintenance  Health Maintenance Due  Topic Date Due   Zoster Vaccines- Shingrix (1 of 2) Never done   DEXA SCAN  Never done   PNA vac Low Risk Adult (2 of 2 - PCV13) 04/12/2016   COVID-19 Vaccine (4 - Booster for Pfizer series) 10/28/2020   INFLUENZA VACCINE  03/20/2021    Colorectal cancer screening: No longer required.   Mammogram status: Completed 01/09/2021. Repeat every year  Bone Density status: Ordered  . Pt provided with  contact info and advised to call to schedule appt.  Lung Cancer Screening: (Low Dose CT Chest recommended if Age 27-80 years, 30 pack-year currently smoking OR have quit w/in 15years.) does not qualify.   Lung Cancer Screening Referral: NA  Additional Screening:  Hepatitis C Screening: does not qualify; Completed NA  Vision Screening: Recommended annual ophthalmology exams for early detection of glaucoma and other disorders of the eye. Is the patient up to date with their annual eye exam?  Yes  Who is the provider or what is the name of the office in which the patient attends annual eye exams? [facility]  If pt is not established with a provider,would they like to be referred to a provider to establish care?  Patient already established  .   Dental Screening: Recommended annual dental exams for proper oral hygiene  Community Resource Referral / Chronic Care Management: CRR required this visit?  No   CCM required this visit?  No      Plan:     I have personally reviewed and noted the following in the patient's chart:   Medical and social history Use of alcohol, tobacco or illicit drugs  Current medications and supplements including opioid prescriptions.  Functional ability and status Nutritional status Physical activity Advanced directives List of other physicians Hospitalizations, surgeries, and ER visits in previous 12 months Vitals Screenings to include cognitive, depression, and falls Referrals and appointments  In addition, I have reviewed and discussed with patient certain preventive protocols, quality metrics, and best practice recommendations. A written personalized care plan for preventive services  as well as general preventive health recommendations were provided to patient.     Lacretia Nicks, [place]   05/04/2021   Nurse Notes:  Ms. Diffey , Thank you for taking time to come for your Medicare Wellness Visit. I appreciate your ongoing commitment to your  health goals. Please review the following plan we discussed and let me know if I can assist you in the future.   These are the goals we discussed:  Goals   None     This is a list of the screening recommended for you and due dates:  Health Maintenance  Topic Date Due   Zoster (Shingles) Vaccine (1 of 2) Never done   DEXA scan (bone density measurement)  Never done   Pneumonia vaccines (2 of 2 - PCV13) 04/12/2016   COVID-19 Vaccine (4 - Booster for Pfizer series) 10/28/2020   Flu Shot  03/20/2021   Tetanus Vaccine  01/20/2031   HPV Vaccine  Aged Out       Time spent with patient 40 min

## 2021-05-05 NOTE — Progress Notes (Deleted)
.  aw

## 2021-05-05 NOTE — Progress Notes (Signed)
I have reviewed this visit and agree with the documentation.   

## 2021-05-09 ENCOUNTER — Encounter: Payer: Self-pay | Admitting: Internal Medicine

## 2021-05-09 ENCOUNTER — Ambulatory Visit (INDEPENDENT_AMBULATORY_CARE_PROVIDER_SITE_OTHER): Payer: Medicare Other | Admitting: Internal Medicine

## 2021-05-09 VITALS — BP 126/83 | HR 81 | Ht 65.0 in | Wt 147.8 lb

## 2021-05-09 DIAGNOSIS — R600 Localized edema: Secondary | ICD-10-CM | POA: Diagnosis not present

## 2021-05-09 DIAGNOSIS — J410 Simple chronic bronchitis: Secondary | ICD-10-CM | POA: Diagnosis not present

## 2021-05-09 DIAGNOSIS — N390 Urinary tract infection, site not specified: Secondary | ICD-10-CM

## 2021-05-09 DIAGNOSIS — I10 Essential (primary) hypertension: Secondary | ICD-10-CM

## 2021-05-09 DIAGNOSIS — K219 Gastro-esophageal reflux disease without esophagitis: Secondary | ICD-10-CM | POA: Diagnosis not present

## 2021-05-09 DIAGNOSIS — J439 Emphysema, unspecified: Secondary | ICD-10-CM | POA: Diagnosis not present

## 2021-05-09 DIAGNOSIS — M19012 Primary osteoarthritis, left shoulder: Secondary | ICD-10-CM

## 2021-05-09 DIAGNOSIS — E039 Hypothyroidism, unspecified: Secondary | ICD-10-CM

## 2021-05-09 DIAGNOSIS — M19011 Primary osteoarthritis, right shoulder: Secondary | ICD-10-CM | POA: Diagnosis not present

## 2021-05-09 DIAGNOSIS — I872 Venous insufficiency (chronic) (peripheral): Secondary | ICD-10-CM | POA: Diagnosis not present

## 2021-05-09 LAB — POCT URINALYSIS DIPSTICK
Bilirubin, UA: NEGATIVE
Blood, UA: NEGATIVE
Glucose, UA: NEGATIVE
Nitrite, UA: NEGATIVE
Protein, UA: NEGATIVE
Spec Grav, UA: 1.02 (ref 1.010–1.025)
Urobilinogen, UA: NEGATIVE E.U./dL — AB
pH, UA: 6 (ref 5.0–8.0)

## 2021-05-09 NOTE — Assessment & Plan Note (Addendum)
Patient denies any chest pain or shortness of breath there is no history of palpitation or paroxysmal nocturnal dyspnea   patient was advised to follow low-salt low-cholesterol diet    ideally I want to keep systolic blood pressure below 130 mmHg, patient was asked to check blood pressure one times a week and give me a report on that.  Patient will be follow-up in 3 months  or earlier as needed, patient will call me back for any change in the cardiovascular symptoms  .

## 2021-05-09 NOTE — Assessment & Plan Note (Signed)
She does not have any pedal edema

## 2021-05-09 NOTE — Assessment & Plan Note (Signed)
History of chronic problem she does not have any temperature bowel productive sputum she does not smoke

## 2021-05-09 NOTE — Assessment & Plan Note (Signed)
-   The patient's GERD is stable on medication.  - Instructed the patient to avoid eating spicy and acidic foods, as well as foods high in fat. - Instructed the patient to avoid eating large meals or meals 2-3 hours prior to sleeping. 

## 2021-05-09 NOTE — Progress Notes (Signed)
Established Patient Office Visit  Subjective:  Patient ID: Abigail Strong, female    DOB: 02/01/1930  Age: 87 y.o. MRN: [id number]  CC:  Chief Complaint  Patient presents with   Urinary Tract Infection    Urinary Tract Infection    Abigail Strong presents for check up  Past Medical History:  Diagnosis Date   Depression    GERD (gastroesophageal reflux disease)    Hyperlipidemia    Hyperlipidemia, unspecified 01/01/2014   Hypothyroidism    Insomnia    Musculoskeletal chest pain 09/27/2016    Past Surgical History:  Procedure Laterality Date   ABDOMINAL HYSTERECTOMY     BREAST BIOPSY Bilateral 90's   benign   cataracts     COLON SURGERY     partial colectomy   intestinal blockage     NISSEN FUNDOPLICATION      Family History  Problem Relation Age of Onset   Cervical cancer Mother    Breast cancer Maternal Aunt     Social History   Socioeconomic History   Marital status: Married    Spouse name: Not on file   Number of children: Not on file   Years of education: Not on file   Highest education level: Not on file  Occupational History   Not on file  Tobacco Use   Smoking status: Never   Smokeless tobacco: Never  Substance and Sexual Activity   Alcohol use: No   Drug use: No   Sexual activity: Not Currently    Birth control/protection: Post-menopausal  Other Topics Concern   Not on file  Social History Narrative   Not on file   Social Determinants of Health   Financial Resource Strain: High Risk   Difficulty of Paying Living Expenses: Very hard  Food Insecurity: No Food Insecurity   Worried About Programme researcher, broadcasting/film/video in the Last Year: Never true   Ran Out of Food in the Last Year: Never true  Transportation Needs: No Transportation Needs   Lack of Transportation (Medical): No   Lack of Transportation (Non-Medical): No  Physical Activity: Inactive   Days of Exercise per Week: 0 days   Minutes of Exercise per Session: 0 min  Stress: No  Stress Concern Present   Feeling of Stress : Only a little  Social Connections: Moderately Integrated   Frequency of Communication with Friends and Family: More than three times a week   Frequency of Social Gatherings with Friends and Family: More than three times a week   Attends Religious Services: 1 to 4 times per year   Active Member of [employer] or Organizations: Yes   Attends Banker Meetings: 1 to 4 times per year   Marital Status: Widowed  Catering manager Violence: Not At Risk   Fear of Current or Ex-Partner: No   Emotionally Abused: No   Physically Abused: No   Sexually Abused: No     Current Outpatient Medications:    acetaminophen (TYLENOL) 500 MG tablet, Take 1,000 mg by mouth every 6 (six) hours as needed for mild pain or moderate pain., Disp: , Rfl:    albuterol (PROVENTIL) (2.5 MG/3ML) 0.083% nebulizer solution, USE 1 VIAL IN NEBULIZER ONCE DAILY AS NEEDED (Patient taking differently: Take 2.5 mg by nebulization daily as needed for wheezing or shortness of breath.), Disp: 75 mL, Rfl: 0   aspirin 81 MG chewable tablet, Chew 81 mg by mouth daily., Disp: , Rfl:    bisacodyl (DULCOLAX) 5 MG EC  tablet, Take 2 tablets (10 mg total) by mouth daily as needed for moderate constipation., Disp: 30 tablet, Rfl: 0   budesonide-formoterol (SYMBICORT) 160-4.5 MCG/ACT inhaler, Inhale 2 puffs into the lungs 2 (two) times daily., Disp: 1 each, Rfl: 3   clopidogrel (PLAVIX) 75 MG tablet, Take 1 tablet by mouth once daily, Disp: 90 tablet, Rfl: 0   clotrimazole-betamethasone (LOTRISONE) cream, Apply 1 application topically 2 (two) times daily., Disp: 15 g, Rfl: 1   furosemide (LASIX) 40 MG tablet, Take 1 tablet (40 mg total) by mouth daily. (Patient taking differently: Take 40 mg by mouth daily as needed for fluid or edema.), Disp: 30 tablet, Rfl: 6   iron polysaccharides (NIFEREX) 150 MG capsule, Take 1 capsule (150 mg total) by mouth daily., Disp: 30 capsule, Rfl: 2   levothyroxine  (SYNTHROID) 125 MCG tablet, Take 1 tablet by mouth once daily (Patient taking differently: Take 125 mcg by mouth daily.), Disp: 90 tablet, Rfl: 0   melatonin 5 MG TABS, Take 1 tablet (5 mg total) by mouth at bedtime., Disp: 30 tablet, Rfl: 2   methylPREDNISolone (MEDROL DOSEPAK) 4 MG TBPK tablet, Medrol Dosepak 4 mg as directed, Disp: 21 tablet, Rfl: 0   nitrofurantoin (MACRODANTIN) 100 MG capsule, Take 1 capsule (100 mg total) by mouth 2 (two) times daily., Disp: 20 capsule, Rfl: 0   nitrofurantoin (MACRODANTIN) 25 MG capsule, Take 1 capsule (25 mg total) by mouth 4 (four) times daily., Disp: 20 capsule, Rfl: 0   omeprazole (PRILOSEC) 40 MG capsule, Take 1 capsule (40 mg total) by mouth daily., Disp: 180 capsule, Rfl: 2   polyethylene glycol (MIRALAX / GLYCOLAX) 17 g packet, Take 17 g by mouth daily., Disp: 14 each, Rfl: 0   pravastatin (PRAVACHOL) 40 MG tablet, TAKE 1 TABLET BY MOUTH AT BEDTIME, Disp: 90 tablet, Rfl: 0   trimethoprim (TRIMPEX) 100 MG tablet, Take 1 tablet (100 mg total) by mouth 2 (two) times daily., Disp: 14 tablet, Rfl: 0   trimethoprim (TRIMPEX) 100 MG tablet, Take 1 tablet (100 mg total) by mouth 2 (two) times daily., Disp: 10 tablet, Rfl: 0   vitamin B-12 100 MCG tablet, Take 1 tablet (100 mcg total) by mouth daily., Disp: 30 tablet, Rfl: 2   Allergies  Allergen Reactions   Gabapentin Other (See Comments)    Dizziness    Sulfa Antibiotics Other (See Comments)   Sulfonamide Derivatives     ROS Review of Systems  Constitutional: Negative.   HENT: Negative.    Eyes: Negative.   Respiratory: Negative.    Cardiovascular: Negative.   Gastrointestinal: Negative.   Endocrine: Negative.   Genitourinary: Negative.   Musculoskeletal: Negative.   Skin: Negative.   Allergic/Immunologic: Negative.   Neurological: Negative.   Hematological: Negative.   Psychiatric/Behavioral: Negative.    All other systems reviewed and are negative.    Objective:    Physical  Exam Vitals reviewed.  Constitutional:      Appearance: Normal appearance.  HENT:     Mouth/Throat:     Mouth: Mucous membranes are moist.  Eyes:     Pupils: Pupils are equal, round, and reactive to light.  Neck:     Vascular: No carotid bruit.  Cardiovascular:     Rate and Rhythm: Normal rate and regular rhythm.     Pulses: Normal pulses.     Heart sounds: Normal heart sounds.  Pulmonary:     Effort: Pulmonary effort is normal.     Breath sounds: Normal breath  sounds.  Abdominal:     General: Bowel sounds are normal.     Palpations: Abdomen is soft. There is no hepatomegaly, splenomegaly or mass.     Tenderness: There is no abdominal tenderness.     Hernia: No hernia is present.  Musculoskeletal:        General: No tenderness.     Cervical back: Neck supple.     Right lower leg: No edema.     Left lower leg: No edema.  Skin:    Findings: No rash.  Neurological:     Mental Status: She is alert and oriented to person, place, and time.     Motor: No weakness.  Psychiatric:        Mood and Affect: Mood and affect normal.        Behavior: Behavior normal.    BP 126/83   Pulse 81   Ht 5\' 5"  (1.651 m)   Wt 147 lb 12.8 oz (67 kg)   BMI 24.60 kg/m  Wt Readings from Last 3 Encounters:  05/09/21 147 lb 12.8 oz (67 kg)  05/02/21 148 lb 1.6 oz (67.2 kg)  04/19/21 153 lb (69.4 kg)     Health Maintenance Due  Topic Date Due   Zoster Vaccines- Shingrix (1 of 2) Never done   DEXA SCAN  Never done   COVID-19 Vaccine (4 - Booster for Pfizer series) 10/28/2020   INFLUENZA VACCINE  03/20/2021    There are no preventive care reminders to display for this patient.  Lab Results  Component Value Date   TSH 0.11 (L) 04/26/2020   Lab Results  Component Value Date   WBC 9.8 03/12/2021   HGB 11.4 (L) 03/12/2021   HCT 34.3 (L) 03/12/2021   MCV 90.0 03/12/2021   PLT 213 03/12/2021   Lab Results  Component Value Date   NA 136 03/12/2021   K 4.0 03/12/2021   CO2 29  03/12/2021   GLUCOSE 98 03/12/2021   BUN 11 03/12/2021   CREATININE 0.79 03/12/2021   BILITOT 0.6 04/26/2020   ALKPHOS 52 04/24/2019   AST 15 04/26/2020   ALT 8 04/26/2020   PROT 6.4 04/26/2020   ALBUMIN 4.1 04/24/2019   CALCIUM 8.7 (L) 03/12/2021   ANIONGAP 7 03/12/2021   Lab Results  Component Value Date   CHOL 182 04/26/2020   Lab Results  Component Value Date   HDL 71 04/26/2020   Lab Results  Component Value Date   LDLCALC 92 04/26/2020   Lab Results  Component Value Date   TRIG 97 04/26/2020   Lab Results  Component Value Date   CHOLHDL 2.6 04/26/2020   Lab Results  Component Value Date   HGBA1C 6.3 (H) 09/24/2016      Assessment & Plan:   Problem List Items Addressed This Visit       Cardiovascular and Mediastinum   Essential hypertension     Patient denies any chest pain or shortness of breath there is no history of palpitation or paroxysmal nocturnal dyspnea   patient was advised to follow low-salt low-cholesterol diet    ideally I want to keep systolic blood pressure below 332 mmHg, patient was asked to check blood pressure one times a week and give me a report on that.  Patient will be follow-up in 3 months  or earlier as needed, patient will call me back for any change in the cardiovascular symptoms  .      Edema of lower leg due to  peripheral venous insufficiency    She does not have any pedal edema        Respiratory   Chronic bronchitis (HCC)    History of chronic problem she does not have any temperature bowel productive sputum she does not smoke        Digestive   GERD (gastroesophageal reflux disease)    - The patient's GERD is stable on medication.  - Instructed the patient to avoid eating spicy and acidic foods, as well as foods high in fat. - Instructed the patient to avoid eating large meals or meals 2-3 hours prior to sleeping.        Endocrine   Hypothyroidism, unspecified    Stable on medicine         Musculoskeletal and Integument   OA (osteoarthritis)    Chronic persistent problem        Genitourinary   Urinary tract infection without hematuria - Primary    Refer to urology      Relevant Orders   POCT urinalysis dipstick (Completed)    No orders of the defined types were placed in this encounter.   Follow-up: No follow-ups on file.    Corky Downs, MD

## 2021-05-09 NOTE — Assessment & Plan Note (Signed)
Stable on medicine 

## 2021-05-09 NOTE — Assessment & Plan Note (Signed)
Chronic persistent problem

## 2021-05-09 NOTE — Assessment & Plan Note (Signed)
Refer to urology.  ?

## 2021-05-10 NOTE — Progress Notes (Signed)
05/22/21 4:47 PM   Abigail Strong Feb 23, 1930 [id number]  Referring provider:  Corky Downs, MD [address] [place],  [place] [address]  Chief Complaint  Patient presents with   Urinary Tract Infection   Urological history: Nocturia  -Risk factors for nocturia: COPD, hypertension, arthritis, depression and pedal edema -PVR 20 mL - Managed on myrbetriq 25 mg daily   Frequency  -contributing factors of age, COPD, diuretics, prediabetes and depression - Managed on Myrbetriq 25 mg daily     HPI: Abigail Strong is a 87 y.o.female who presents today for further evaluation of urinary tract infection without hematuria.  Urine today is nitrite positive, 11-30 wbcs, and many bacteria   PVR 20 mL.    She reports today that her urinary symptoms started in July it was accompanied by a milkish appearance. She reports that she has been on two different antibiotics.   The milkish appearance in her urine has went away, but her dysuria, incontinence and weak urinary stream remain.    She has been experiencing some leakage. She has also been having chills at night.   Patient denies any modifying or aggravating factors.  Patient denies any gross hematuria, dysuria or suprapubic/flank pain.  Patient denies any fevers, chills, nausea or vomiting.     PMH: Past Medical History:  Diagnosis Date   Depression    GERD (gastroesophageal reflux disease)    Hyperlipidemia    Hyperlipidemia, unspecified 01/01/2014   Hypothyroidism    Insomnia    Musculoskeletal chest pain 09/27/2016    Surgical History: Past Surgical History:  Procedure Laterality Date   ABDOMINAL HYSTERECTOMY     BREAST BIOPSY Bilateral 90's   benign   cataracts     COLON SURGERY     partial colectomy   intestinal blockage     NISSEN FUNDOPLICATION      Home Medications:  Allergies as of 05/11/2021       Reactions   Gabapentin Other (See Comments)   Dizziness    Sulfa Antibiotics Other (See Comments)    Sulfonamide Derivatives         Medication List        Accurate as of May 11, 2021 11:59 PM. If you have any questions, ask your nurse or doctor.          STOP taking these medications    bisacodyl 5 MG EC tablet Commonly known as: DULCOLAX Stopped by: Michiel Cowboy, PA-C   clotrimazole-betamethasone cream Commonly known as: Lotrisone Stopped by: Kearia Yin, PA-C   methylPREDNISolone 4 MG Tbpk tablet Commonly known as: MEDROL DOSEPAK Stopped by: Reneshia Zuccaro, PA-C   nitrofurantoin 100 MG capsule Commonly known as: Macrodantin Stopped by: Tekela Garguilo, PA-C   nitrofurantoin 25 MG capsule Commonly known as: Macrodantin Stopped by: Myangel Summons, PA-C   polyethylene glycol 17 g packet Commonly known as: MIRALAX / GLYCOLAX Stopped by: Lou Loewe, PA-C   pravastatin 40 MG tablet Commonly known as: PRAVACHOL Stopped by: Kitrina Maurin, PA-C   rosuvastatin 20 MG tablet Commonly known as: CRESTOR Stopped by: Shon Mansouri, PA-C   trimethoprim 100 MG tablet Commonly known as: TRIMPEX Stopped by: Michiel Cowboy, PA-C       TAKE these medications    acetaminophen 500 MG tablet Commonly known as: TYLENOL Take 1,000 mg by mouth every 6 (six) hours as needed for mild pain or moderate pain.   albuterol (2.5 MG/3ML) 0.083% nebulizer solution Commonly known as: PROVENTIL USE 1 VIAL IN NEBULIZER ONCE DAILY  AS NEEDED What changed: See the new instructions.   aspirin 81 MG chewable tablet Chew 81 mg by mouth daily.   budesonide-formoterol 160-4.5 MCG/ACT inhaler Commonly known as: SYMBICORT Inhale 2 puffs into the lungs 2 (two) times daily.   clopidogrel 75 MG tablet Commonly known as: PLAVIX Take 1 tablet by mouth once daily   cyanocobalamin 100 MCG tablet Take 1 tablet (100 mcg total) by mouth daily.   furosemide 40 MG tablet Commonly known as: LASIX Take 1 tablet (40 mg total) by mouth daily. What changed:  when to take  this reasons to take this   iron polysaccharides 150 MG capsule Commonly known as: NIFEREX Take 1 capsule (150 mg total) by mouth daily.   levothyroxine 125 MCG tablet Commonly known as: SYNTHROID Take 1 tablet by mouth once daily   melatonin 5 MG Tabs Take 1 tablet (5 mg total) by mouth at bedtime.   nitrofurantoin (macrocrystal-monohydrate) 100 MG capsule Commonly known as: MACROBID Take 1 capsule (100 mg total) by mouth every 12 (twelve) hours. Started by: Michiel Cowboy, PA-C   omeprazole 40 MG capsule Commonly known as: PRILOSEC Take 1 capsule (40 mg total) by mouth daily.        Allergies:  Allergies  Allergen Reactions   Gabapentin Other (See Comments)    Dizziness    Sulfa Antibiotics Other (See Comments)   Sulfonamide Derivatives     Family History: Family History  Problem Relation Age of Onset   Cervical cancer Mother    Breast cancer Maternal Aunt     Social History:  reports that she has never smoked. She has never used smokeless tobacco. She reports that she does not drink alcohol and does not use drugs.   Physical Exam: BP 109/72   Pulse 83   Ht 5\' 5"  (1.651 m)   Wt 147 lb (66.7 kg)   BMI 24.46 kg/m   Constitutional:  Alert and oriented, No acute distress. HEENT:  AT, mask in place.  Trachea midline Cardiovascular: No clubbing, cyanosis, or edema. Respiratory: Normal respiratory effort, no increased work of breathing. Neurologic: Grossly intact, no focal deficits, moving all 4 extremities. Psychiatric: Normal mood and affect.  Laboratory Data: Component     Latest Ref Rng & Units 03/12/2021  Sodium     135 - 145 mmol/L 136  Potassium     3.5 - 5.1 mmol/L 4.0  Chloride     98 - 111 mmol/L 100  CO2     22 - 32 mmol/L 29  Glucose     70 - 99 mg/dL 98  BUN     8 - 23 mg/dL 11  Creatinine     1.61 - 1.00 mg/dL 0.96  Calcium     8.9 - 10.3 mg/dL 8.7 (L)  GFR, Estimated     >60 mL/min >60  Anion gap     5 - 15 7     Urinalysis Component     Latest Ref Rng & Units 05/11/2021  Specific Gravity, UA     1.005 - 1.030 >1.030 (H)  pH, UA     5.0 - 7.5 5.5  Color, UA     Yellow Yellow  Appearance Ur     Clear Cloudy (A)  Leukocytes,UA     Negative Trace (A)  Protein,UA     Negative/Trace 1+ (A)  Glucose, UA     Negative Negative  Ketones, UA     Negative Trace (A)  RBC, UA  Negative Negative  Bilirubin, UA     Negative Negative  Urobilinogen, Ur     0.2 - 1.0 mg/dL 0.2  Nitrite, UA     Negative Positive (A)  Microscopic Examination      See below:   Component     Latest Ref Rng & Units 05/11/2021  WBC, UA     0 - 5 /hpf 11-30 (A)  RBC     0 - 2 /hpf 0-2  Epithelial Cells (non renal)     0 - 10 /hpf 0-10  Casts     None seen /lpf Present (A)  Cast Type     N/A Hyaline casts  Mucus, UA     Not Estab. Present (A)  Bacteria, UA     None seen/Few Many (A)  I have reviewed the labs.   Pertinent Imaging  Results for orders placed or performed in visit on 05/11/21  CULTURE, URINE COMPREHENSIVE   Specimen: Urine   UR  Result Value Ref Range   Urine Culture, Comprehensive Final report (A)    Organism ID, Bacteria Escherichia coli (A)    ANTIMICROBIAL SUSCEPTIBILITY Comment   Microscopic Examination   Urine  Result Value Ref Range   WBC, UA 11-30 (A) 0 - 5 /hpf   RBC 0-2 0 - 2 /hpf   Epithelial Cells (non renal) 0-10 0 - 10 /hpf   Casts Present (A) None seen /lpf   Cast Type Hyaline casts N/A   Mucus, UA Present (A) Not Estab.   Bacteria, UA Many (A) None seen/Few  Urinalysis, Complete  Result Value Ref Range   Specific Gravity, UA >1.030 (H) 1.005 - 1.030   pH, UA 5.5 5.0 - 7.5   Color, UA Yellow Yellow   Appearance Ur Cloudy (A) Clear   Leukocytes,UA Trace (A) Negative   Protein,UA 1+ (A) Negative/Trace   Glucose, UA Negative Negative   Ketones, UA Trace (A) Negative   RBC, UA Negative Negative   Bilirubin, UA Negative Negative   Urobilinogen, Ur 0.2 0.2 -  1.0 mg/dL   Nitrite, UA Positive (A) Negative   Microscopic Examination See below:   Bladder Scan (Post Void Residual) in office  Result Value Ref Range   Scan Result 20      Assessment & Plan:   UTI  - Urine nitrite positive, 11-30 WBCs, and many bacteria  - Urine culture; Pending - Prescribed macrobid 100 mg for infection   - Start on vaginal estrogen cream   Nocturia  - Continue conservative management  - address more next visit   OAB  - as above   Return in about 1 month (around 06/10/2021) for symptom recheck .  [facility] [address], [address] [place], [place] [address] [phone]  [facility] as a scribe for [facility], PA-C.,have documented all relevant documentation on the behalf of Theron Cumbie, PA-C,as directed by  [facility], PA-C while in the presence of Alean Kromer, PA-C..  I have reviewed the above documentation for accuracy and completeness, and I agree with the above.    Michiel Cowboy, PA-C

## 2021-05-11 ENCOUNTER — Ambulatory Visit: Payer: Medicare Other | Admitting: Urology

## 2021-05-11 ENCOUNTER — Other Ambulatory Visit: Payer: Self-pay

## 2021-05-11 ENCOUNTER — Encounter: Payer: Self-pay | Admitting: Urology

## 2021-05-11 VITALS — BP 109/72 | HR 83 | Ht 65.0 in | Wt 147.0 lb

## 2021-05-11 DIAGNOSIS — N3001 Acute cystitis with hematuria: Secondary | ICD-10-CM | POA: Diagnosis not present

## 2021-05-11 DIAGNOSIS — N390 Urinary tract infection, site not specified: Secondary | ICD-10-CM | POA: Diagnosis not present

## 2021-05-11 DIAGNOSIS — R350 Frequency of micturition: Secondary | ICD-10-CM | POA: Diagnosis not present

## 2021-05-11 LAB — BLADDER SCAN AMB NON-IMAGING: Scan Result: 20

## 2021-05-11 MED ORDER — NITROFURANTOIN MONOHYD MACRO 100 MG PO CAPS
100.0000 mg | ORAL_CAPSULE | Freq: Two times a day (BID) | ORAL | 0 refills | Status: DC
Start: 2021-05-11 — End: 2021-06-13

## 2021-05-12 LAB — MICROSCOPIC EXAMINATION

## 2021-05-12 LAB — URINALYSIS, COMPLETE
Bilirubin, UA: NEGATIVE
Glucose, UA: NEGATIVE
Nitrite, UA: POSITIVE — AB
RBC, UA: NEGATIVE
Specific Gravity, UA: >1.03 — ABNORMAL HIGH (ref 1.005–1.030)
Urobilinogen, Ur: 0.2 mg/dL (ref 0.2–1.0)
pH, UA: 5.5 (ref 5.0–7.5)

## 2021-05-18 LAB — CULTURE, URINE COMPREHENSIVE

## 2021-05-25 ENCOUNTER — Ambulatory Visit: Payer: Medicare Other

## 2021-05-25 ENCOUNTER — Ambulatory Visit: Payer: Medicare Other | Admitting: Urology

## 2021-05-26 ENCOUNTER — Encounter: Payer: Self-pay | Admitting: General Surgery

## 2021-05-26 DIAGNOSIS — M542 Cervicalgia: Secondary | ICD-10-CM | POA: Diagnosis not present

## 2021-06-02 ENCOUNTER — Ambulatory Visit
Admission: RE | Admit: 2021-06-02 | Discharge: 2021-06-02 | Disposition: A | Discharge status: Home / Self Care | Payer: Medicare Other | Source: Ambulatory Visit | Attending: Urology | Admitting: Urology

## 2021-06-02 ENCOUNTER — Other Ambulatory Visit: Payer: Self-pay

## 2021-06-02 DIAGNOSIS — N281 Cyst of kidney, acquired: Secondary | ICD-10-CM | POA: Diagnosis not present

## 2021-06-02 DIAGNOSIS — N390 Urinary tract infection, site not specified: Secondary | ICD-10-CM | POA: Diagnosis not present

## 2021-06-02 DIAGNOSIS — Z87440 Personal history of urinary (tract) infections: Secondary | ICD-10-CM | POA: Diagnosis not present

## 2021-06-08 DIAGNOSIS — J439 Emphysema, unspecified: Secondary | ICD-10-CM | POA: Diagnosis not present

## 2021-06-12 NOTE — Progress Notes (Signed)
07/10/21 1:09 PM   Abigail Strong 10-07-1929 [id number]  Referring provider:  Corky Downs, MD [address] [place],  [place] [address]  Chief Complaint  Patient presents with   Recurrent UTI    Urological history: Nocturia  -Risk factors for nocturia: COPD, hypertension, arthritis, depression and pedal edema -PVR 20 mL - Managed on myrbetriq 25 mg daily   Frequency  -contributing factors of age, COPD, diuretics, prediabetes and depression - Managed on Myrbetriq 25 mg daily   3. rUTI's -contributing factors of age, vaginal atrophy, poor perineal hygiene -documented positive urine cultures over the last year  03/09/2021 ESBL E.coli  05/11/2021 ESBL E.coli   4. Renal cysts -RUS 05/2021 1.3 cm slightly heterogeneous hypoechoic focus, most likely a cyst with debris. Follow-up ultrasound in 6 months suggested for continued evaluation.  1.2 cm benign-appearing cyst with thin septation right kidney.  3.2 cm simple cyst left kidney. No acute renal abnormalities identified. No hydronephrosis or bladder distention.  HPI: Abigail Strong is a 87 y.o.female who presents today for recheck on UTI.    UA 11-30 WBC's and many bacteria.  RUS 05/2021 1.3 cm slightly heterogeneous hypoechoic focus, most likely a cyst with debris. Follow-up ultrasound in 6 months suggested for continued evaluation.  1.2 cm benign-appearing cyst with thin septation right kidney.  3.2 cm simple cyst left kidney. No acute renal abnormalities identified. No hydronephrosis or bladder distention.  She states her urine has cleared and she no longer has dysuria.  Patient denies any modifying or aggravating factors.  Patient denies any gross hematuria, dysuria or suprapubic/flank pain.  Patient denies any fevers, chills, nausea or vomiting.    PMH: Past Medical History:  Diagnosis Date   Depression    GERD (gastroesophageal reflux disease)    Hyperlipidemia    Hyperlipidemia, unspecified 01/01/2014    Hypothyroidism    Insomnia    Musculoskeletal chest pain 09/27/2016    Surgical History: Past Surgical History:  Procedure Laterality Date   ABDOMINAL HYSTERECTOMY     BREAST BIOPSY Bilateral 90's   benign   cataracts     COLON SURGERY     partial colectomy   intestinal blockage     NISSEN FUNDOPLICATION      Home Medications:  Allergies as of 06/13/2021       Reactions   Gabapentin Other (See Comments)   Dizziness    Sulfa Antibiotics Other (See Comments)   Sulfonamide Derivatives         Medication List        Accurate as of June 13, 2021 11:59 PM. If you have any questions, ask your nurse or doctor.          STOP taking these medications    budesonide-formoterol 160-4.5 MCG/ACT inhaler Commonly known as: SYMBICORT Stopped by: Michiel Cowboy, PA-C   nitrofurantoin (macrocrystal-monohydrate) 100 MG capsule Commonly known as: MACROBID Stopped by: Michiel Cowboy, PA-C       TAKE these medications    acetaminophen 500 MG tablet Commonly known as: TYLENOL Take 1,000 mg by mouth every 6 (six) hours as needed for mild pain or moderate pain.   albuterol (2.5 MG/3ML) 0.083% nebulizer solution Commonly known as: PROVENTIL USE 1 VIAL IN NEBULIZER ONCE DAILY AS NEEDED What changed: See the new instructions.   aspirin 81 MG chewable tablet Chew 81 mg by mouth daily.   clopidogrel 75 MG tablet Commonly known as: PLAVIX Take 1 tablet by mouth once daily   furosemide 40 MG tablet  Commonly known as: LASIX Take 1 tablet (40 mg total) by mouth daily. What changed:  when to take this reasons to take this   iron polysaccharides 150 MG capsule Commonly known as: NIFEREX Take 1 capsule (150 mg total) by mouth daily.   levothyroxine 125 MCG tablet Commonly known as: SYNTHROID Take 1 tablet by mouth once daily   omeprazole 40 MG capsule Commonly known as: PRILOSEC Take 1 capsule (40 mg total) by mouth daily.        Allergies:  Allergies   Allergen Reactions   Gabapentin Other (See Comments)    Dizziness    Sulfa Antibiotics Other (See Comments)   Sulfonamide Derivatives     Family History: Family History  Problem Relation Age of Onset   Cervical cancer Mother    Breast cancer Maternal Aunt     Social History:  reports that she has never smoked. She has never used smokeless tobacco. She reports that she does not drink alcohol and does not use drugs.   Physical Exam: BP 128/83   Pulse 89   Ht 5\' 5"  (1.651 m)   Wt 146 lb (66.2 kg)   BMI 24.30 kg/m   Constitutional:  Well nourished. Alert and oriented, No acute distress. HEENT: [place] AT, mask in place.  Trachea midline Cardiovascular: No clubbing, cyanosis, or edema. Respiratory: Normal respiratory effort, no increased work of breathing. Neurologic: Grossly intact, no focal deficits, moving all 4 extremities. Psychiatric: Normal mood and affect.    Laboratory Data: Urinalysis Component     Latest Ref Rng & Units 06/13/2021  Specific Gravity, UA     1.005 - 1.030 1.025  pH, UA     5.0 - 7.5 6.0  Color, UA     Yellow Yellow  Appearance Ur     Clear Clear  Leukocytes,UA     Negative 2+ (A)  Protein,UA     Negative/Trace Negative  Glucose, UA     Negative Negative  Ketones, UA     Negative Trace (A)  RBC, UA     Negative Trace (A)  Bilirubin, UA     Negative Negative  Urobilinogen, Ur     0.2 - 1.0 mg/dL 0.2  Nitrite, UA     Negative Negative  Microscopic Examination      See below:   Component     Latest Ref Rng & Units 06/13/2021  WBC, UA     0 - 5 /hpf 11-30 (A)  RBC     0 - 2 /hpf None seen  Epithelial Cells (non renal)     0 - 10 /hpf >10 (H)  Bacteria, UA     None seen/Few Many (A)  I have reviewed the labs.   Pertinent Imaging  CLINICAL DATA:  Recurrent UTIs.   EXAM: RENAL / URINARY TRACT ULTRASOUND COMPLETE   COMPARISON:  CT 09/28/2018.  Ultrasound 03/21/2017.   FINDINGS: Right Kidney:   Renal measurements: 9.3 x 3.8  x 4.2 cm = volume: 78 mL. Echogenicity within normal limits. 1.3 cm slightly heterogeneous hypoechoic focus, most likely a cyst with debris. Follow-up ultrasound in 6 months suggested for continued evaluation. 1.2 cm benign-appearing cyst with thin septation. No hydronephrosis visualized.   Left Kidney:   Renal measurements: 9.8 x 5.0 x 5.5 cm = volume: 140 mL. Echogenicity within normal limits. 3.2 cm simple cyst. No hydronephrosis visualized.   Bladder:   Appears normal for degree of bladder distention.   Other:   None.  IMPRESSION: 1. 1.3 cm slightly heterogeneous hypoechoic focus, most likely a cyst with debris. Follow-up ultrasound in 6 months suggested for continued evaluation.   2. 1.2 cm benign-appearing cyst with thin septation right kidney. 3.2 cm simple cyst left kidney. No acute renal abnormalities identified. No hydronephrosis or bladder distention.     Electronically Signed   By: Maisie Fus  Register M.D.   On: 06/06/2021 08:39 I have independently reviewed the films.  See HPI.      Assessment & Plan:    UTI  - resolved  Nocturia  - continue conservative management   OAB  - as above   4. Complex renal cyst -explained the ultrasound findings and that it cannot be labeled a simple cyst at this time, so a repeat ultrasound is needed to document stability or to rule out malignancy  -repeat renal ultrasound in 6 months (11/2021)  5. Vaginal atrophy -continue the vaginal estrogen cream three nights weekly   Return in about 6 months (around 12/12/2021) for RUS report .  [facility] [address], [address] [place], [place] [address] [phone] Michiel Cowboy, PA-C

## 2021-06-13 ENCOUNTER — Other Ambulatory Visit: Payer: Self-pay

## 2021-06-13 ENCOUNTER — Ambulatory Visit: Payer: Medicare Other | Admitting: Urology

## 2021-06-13 ENCOUNTER — Encounter: Payer: Self-pay | Admitting: Urology

## 2021-06-13 VITALS — BP 128/83 | HR 89 | Ht 65.0 in | Wt 146.0 lb

## 2021-06-13 DIAGNOSIS — N390 Urinary tract infection, site not specified: Secondary | ICD-10-CM

## 2021-06-13 DIAGNOSIS — N952 Postmenopausal atrophic vaginitis: Secondary | ICD-10-CM

## 2021-06-13 DIAGNOSIS — N3281 Overactive bladder: Secondary | ICD-10-CM | POA: Diagnosis not present

## 2021-06-13 DIAGNOSIS — N281 Cyst of kidney, acquired: Secondary | ICD-10-CM

## 2021-06-13 DIAGNOSIS — R351 Nocturia: Secondary | ICD-10-CM

## 2021-06-13 LAB — URINALYSIS, COMPLETE
Bilirubin, UA: NEGATIVE
Glucose, UA: NEGATIVE
Nitrite, UA: NEGATIVE
Protein,UA: NEGATIVE
Specific Gravity, UA: 1.025 (ref 1.005–1.030)
Urobilinogen, Ur: 0.2 mg/dL (ref 0.2–1.0)
pH, UA: 6 (ref 5.0–7.5)

## 2021-06-13 LAB — MICROSCOPIC EXAMINATION
Epithelial Cells (non renal): >10 /hpf — ABNORMAL HIGH (ref 0–10)
RBC, Urine: NONE SEEN /hpf (ref 0–2)

## 2021-06-15 DIAGNOSIS — I10 Essential (primary) hypertension: Secondary | ICD-10-CM | POA: Diagnosis not present

## 2021-06-15 DIAGNOSIS — R079 Chest pain, unspecified: Secondary | ICD-10-CM | POA: Diagnosis not present

## 2021-06-15 DIAGNOSIS — R609 Edema, unspecified: Secondary | ICD-10-CM | POA: Diagnosis not present

## 2021-06-15 DIAGNOSIS — R0602 Shortness of breath: Secondary | ICD-10-CM | POA: Diagnosis not present

## 2021-06-15 DIAGNOSIS — I340 Nonrheumatic mitral (valve) insufficiency: Secondary | ICD-10-CM | POA: Diagnosis not present

## 2021-06-20 DIAGNOSIS — M170 Bilateral primary osteoarthritis of knee: Secondary | ICD-10-CM | POA: Diagnosis not present

## 2021-06-25 DIAGNOSIS — R062 Wheezing: Secondary | ICD-10-CM | POA: Diagnosis not present

## 2021-06-25 DIAGNOSIS — Z20822 Contact with and (suspected) exposure to covid-19: Secondary | ICD-10-CM | POA: Diagnosis not present

## 2021-06-25 DIAGNOSIS — R6883 Chills (without fever): Secondary | ICD-10-CM | POA: Diagnosis not present

## 2021-06-25 DIAGNOSIS — E785 Hyperlipidemia, unspecified: Secondary | ICD-10-CM | POA: Diagnosis not present

## 2021-06-25 DIAGNOSIS — R059 Cough, unspecified: Secondary | ICD-10-CM | POA: Diagnosis not present

## 2021-06-25 DIAGNOSIS — R600 Localized edema: Secondary | ICD-10-CM | POA: Diagnosis not present

## 2021-06-25 DIAGNOSIS — R519 Headache, unspecified: Secondary | ICD-10-CM | POA: Diagnosis not present

## 2021-06-27 ENCOUNTER — Ambulatory Visit: Payer: Medicare Other | Admitting: Internal Medicine

## 2021-06-27 ENCOUNTER — Other Ambulatory Visit: Payer: Self-pay

## 2021-07-09 DIAGNOSIS — J439 Emphysema, unspecified: Secondary | ICD-10-CM | POA: Diagnosis not present

## 2021-07-18 DIAGNOSIS — R053 Chronic cough: Secondary | ICD-10-CM | POA: Diagnosis not present

## 2021-08-08 DIAGNOSIS — J439 Emphysema, unspecified: Secondary | ICD-10-CM | POA: Diagnosis not present

## 2021-08-24 DIAGNOSIS — E039 Hypothyroidism, unspecified: Secondary | ICD-10-CM | POA: Diagnosis not present

## 2021-08-24 DIAGNOSIS — I340 Nonrheumatic mitral (valve) insufficiency: Secondary | ICD-10-CM | POA: Diagnosis not present

## 2021-08-24 DIAGNOSIS — E782 Mixed hyperlipidemia: Secondary | ICD-10-CM | POA: Diagnosis not present

## 2021-08-24 DIAGNOSIS — I2510 Atherosclerotic heart disease of native coronary artery without angina pectoris: Secondary | ICD-10-CM | POA: Diagnosis not present

## 2021-08-24 DIAGNOSIS — R609 Edema, unspecified: Secondary | ICD-10-CM | POA: Diagnosis not present

## 2021-08-24 DIAGNOSIS — J449 Chronic obstructive pulmonary disease, unspecified: Secondary | ICD-10-CM | POA: Diagnosis not present

## 2021-08-24 DIAGNOSIS — R0602 Shortness of breath: Secondary | ICD-10-CM | POA: Diagnosis not present

## 2021-08-24 DIAGNOSIS — I441 Atrioventricular block, second degree: Secondary | ICD-10-CM | POA: Diagnosis not present

## 2021-08-24 DIAGNOSIS — I10 Essential (primary) hypertension: Secondary | ICD-10-CM | POA: Diagnosis not present

## 2021-08-24 DIAGNOSIS — R079 Chest pain, unspecified: Secondary | ICD-10-CM | POA: Diagnosis not present

## 2021-08-24 DIAGNOSIS — J40 Bronchitis, not specified as acute or chronic: Secondary | ICD-10-CM | POA: Diagnosis not present

## 2021-09-01 DIAGNOSIS — I10 Essential (primary) hypertension: Secondary | ICD-10-CM | POA: Diagnosis not present

## 2021-09-04 ENCOUNTER — Telehealth: Payer: Self-pay | Admitting: Urology

## 2021-09-04 NOTE — Telephone Encounter (Signed)
She would need a office visit, whoever has opening.

## 2021-09-04 NOTE — Telephone Encounter (Signed)
Patient called and stated that she thinks she may be getting a UTI.  Symptoms include frequent urination and pressure.

## 2021-09-06 NOTE — Progress Notes (Signed)
09/10/21 7:29 PM   Abigail Strong 1930/05/08 [id number]  Referring provider:  Corky Downs, MD [address] [place],  [place] [address]  Chief Complaint  Patient presents with   Urinary Frequency   Urological history: Nocturia  -Risk factors for nocturia: COPD, hypertension, arthritis, depression and pedal edema -PVR 20 mL-04/2021  Frequency  -contributing factors of age, COPD, diuretics, prediabetes and depression -PVR 20 mL-04/2021  3. rUTI's -contributing factors of age, vaginal atrophy, poor perineal hygiene -documented positive urine cultures over the last year  03/09/2021 ESBL E.coli  05/11/2021 ESBL E.coli   4. Renal cysts -RUS 05/2021 1.3 cm slightly heterogeneous hypoechoic focus, most likely a cyst with debris. Follow-up ultrasound in 6 months suggested for continued evaluation.  1.2 cm benign-appearing cyst with thin septation right kidney.  3.2 cm simple cyst left kidney. No acute renal abnormalities identified. No hydronephrosis or bladder distention.  HPI: Abigail Strong is a 88 y.o.female who presents today for possible UTI.  At her visit on June 13, 2021, she presented for a recheck on her urinary tract infection.  Her urinalysis at that time was positive for 11-30 WBCs and many bacteria, and but she was asymptomatic at that visit.  A renal ultrasound performed on June 06, 2021 for further evaluation of her recurrent UTIs noted a 1.3 cm complex right renal cyst for which radiology recommended a 36-month follow-up.  She was instructed to continue the topical vaginal estrogen cream and to return in April 2023 for repeat renal ultrasound.  Today, her UA is nitrate positive, 11-30 WBCs and many bacteria.  She has been urinated every 30 minutes and it feels awkward.  Patient denies any modifying or aggravating factors.    Patient denies any gross hematuria, dysuria or suprapubic/flank pain.  Patient denies any fevers, chills, nausea or vomiting.    She is  also concerned about the findings on the ultrasound of her kidneys performed in October.  PMH: Past Medical History:  Diagnosis Date   Depression    GERD (gastroesophageal reflux disease)    Hyperlipidemia    Hyperlipidemia, unspecified 01/01/2014   Hypothyroidism    Insomnia    Musculoskeletal chest pain 09/27/2016    Surgical History: Past Surgical History:  Procedure Laterality Date   ABDOMINAL HYSTERECTOMY     BREAST BIOPSY Bilateral 90's   benign   cataracts     COLON SURGERY     partial colectomy   intestinal blockage     NISSEN FUNDOPLICATION      Home Medications:  Allergies as of 09/07/2021       Reactions   Gabapentin Other (See Comments)   Dizziness    Sulfa Antibiotics Other (See Comments)   Sulfonamide Derivatives         Medication List        Accurate as of September 07, 2021 11:59 PM. If you have any questions, ask your nurse or doctor.          acetaminophen 500 MG tablet Commonly known as: TYLENOL Take 1,000 mg by mouth every 6 (six) hours as needed for mild pain or moderate pain.   albuterol (2.5 MG/3ML) 0.083% nebulizer solution Commonly known as: PROVENTIL USE 1 VIAL IN NEBULIZER ONCE DAILY AS NEEDED What changed: See the new instructions.   aspirin 81 MG chewable tablet Chew 81 mg by mouth daily.   clopidogrel 75 MG tablet Commonly known as: PLAVIX Take 1 tablet by mouth once daily   Eliquis 2.5 MG Tabs tablet  Generic drug: apixaban Take 2.5 mg by mouth 2 (two) times daily.   furosemide 40 MG tablet Commonly known as: LASIX Take 1 tablet (40 mg total) by mouth daily. What changed:  when to take this reasons to take this   iron polysaccharides 150 MG capsule Commonly known as: NIFEREX Take 1 capsule (150 mg total) by mouth daily.   levothyroxine 125 MCG tablet Commonly known as: SYNTHROID Take 1 tablet by mouth once daily   nitrofurantoin (macrocrystal-monohydrate) 100 MG capsule Commonly known as: Macrobid Take 1  capsule (100 mg total) by mouth 2 (two) times daily. Started by: Michiel Cowboy, PA-C   omeprazole 40 MG capsule Commonly known as: PRILOSEC Take 1 capsule (40 mg total) by mouth daily.   pravastatin 40 MG tablet Commonly known as: PRAVACHOL Take 40 mg by mouth at bedtime.   torsemide 10 MG tablet Commonly known as: DEMADEX Take 10 mg by mouth daily.        Allergies:  Allergies  Allergen Reactions   Gabapentin Other (See Comments)    Dizziness    Sulfa Antibiotics Other (See Comments)   Sulfonamide Derivatives     Family History: Family History  Problem Relation Age of Onset   Cervical cancer Mother    Breast cancer Maternal Aunt     Social History:  reports that she has never smoked. She has never used smokeless tobacco. She reports that she does not drink alcohol and does not use drugs.   Physical Exam: BP 112/76    Pulse 92    Ht 5\' 2"  (1.575 m)    Wt 146 lb (66.2 kg)    BMI 26.70 kg/m   Constitutional:  Well nourished. Alert and oriented, No acute distress. HEENT: [place] AT, mask in place.  Trachea midline Cardiovascular: No clubbing, cyanosis, or edema. Respiratory: Normal respiratory effort, no increased work of breathing. Neurologic: Grossly intact, no focal deficits, moving all 4 extremities. Psychiatric: Normal mood and affect.    Laboratory Data: Urinalysis Results for orders placed or performed in visit on 09/07/21  CULTURE, URINE COMPREHENSIVE   Specimen: Urine   UR  Result Value Ref Range   Urine Culture, Comprehensive Preliminary report (A)    Organism ID, Bacteria Escherichia coli (A)   Microscopic Examination   Urine  Result Value Ref Range   WBC, UA 11-30 (A) 0 - 5 /hpf   RBC 0-2 0 - 2 /hpf   Epithelial Cells (non renal) 0-10 0 - 10 /hpf   Casts Present (A) None seen /lpf   Cast Type Hyaline casts N/A   Bacteria, UA Many (A) None seen/Few  Urinalysis, Complete  Result Value Ref Range   Specific Gravity, UA >1.030 (H) 1.005 - 1.030    pH, UA 5.5 5.0 - 7.5   Color, UA Orange Yellow   Appearance Ur Cloudy (A) Clear   Leukocytes,UA 1+ (A) Negative   Protein,UA Negative Negative/Trace   Glucose, UA Negative Negative   Ketones, UA Trace (A) Negative   RBC, UA Trace (A) Negative   Bilirubin, UA Negative Negative   Urobilinogen, Ur 0.2 0.2 - 1.0 mg/dL   Nitrite, UA Positive (A) Negative   Microscopic Examination See below:   I have reviewed the labs.   Pertinent Imaging  N/A  Assessment & Plan:    1. Suspected UTI -Symptomatic at this visit -UA grossly infected -Urine sent for culture -Macrobid 100 mg twice daily for 7 days sent to pharmacy and will be adjusted if necessary  once urine culture results are available  2. Complex renal cyst  -explained the ultrasound findings and that it cannot be labeled a simple cyst at this time, so a repeat ultrasound is needed to document stability or to rule out malignancy  -repeat renal ultrasound in 6 months (11/2021)  3. Vaginal atrophy -continue the vaginal estrogen cream three nights weekly   Return for Keep follow up appointment in May 2023.  [facility] [address], [address] [place], [place] [address] [phone] Michiel Cowboy, PA-C

## 2021-09-07 ENCOUNTER — Encounter: Payer: Self-pay | Admitting: Urology

## 2021-09-07 ENCOUNTER — Other Ambulatory Visit: Payer: Self-pay

## 2021-09-07 ENCOUNTER — Ambulatory Visit: Payer: Medicare Other | Admitting: Urology

## 2021-09-07 VITALS — BP 112/76 | HR 92 | Ht 62.0 in | Wt 146.0 lb

## 2021-09-07 DIAGNOSIS — R3989 Other symptoms and signs involving the genitourinary system: Secondary | ICD-10-CM | POA: Diagnosis not present

## 2021-09-07 DIAGNOSIS — N281 Cyst of kidney, acquired: Secondary | ICD-10-CM

## 2021-09-07 DIAGNOSIS — N952 Postmenopausal atrophic vaginitis: Secondary | ICD-10-CM | POA: Diagnosis not present

## 2021-09-07 LAB — MICROSCOPIC EXAMINATION

## 2021-09-07 LAB — URINALYSIS, COMPLETE
Bilirubin, UA: NEGATIVE
Glucose, UA: NEGATIVE
Nitrite, UA: POSITIVE — AB
Protein,UA: NEGATIVE
Specific Gravity, UA: >1.03 — ABNORMAL HIGH (ref 1.005–1.030)
Urobilinogen, Ur: 0.2 mg/dL (ref 0.2–1.0)
pH, UA: 5.5 (ref 5.0–7.5)

## 2021-09-07 MED ORDER — NITROFURANTOIN MONOHYD MACRO 100 MG PO CAPS: 100.0000 mg | ORAL_CAPSULE | Freq: Two times a day (BID) | ORAL | 0 refills | Status: DC

## 2021-09-08 DIAGNOSIS — I340 Nonrheumatic mitral (valve) insufficiency: Secondary | ICD-10-CM | POA: Diagnosis not present

## 2021-09-08 DIAGNOSIS — R0602 Shortness of breath: Secondary | ICD-10-CM | POA: Diagnosis not present

## 2021-09-08 DIAGNOSIS — E039 Hypothyroidism, unspecified: Secondary | ICD-10-CM | POA: Diagnosis not present

## 2021-09-08 DIAGNOSIS — R609 Edema, unspecified: Secondary | ICD-10-CM | POA: Diagnosis not present

## 2021-09-08 DIAGNOSIS — I2510 Atherosclerotic heart disease of native coronary artery without angina pectoris: Secondary | ICD-10-CM | POA: Diagnosis not present

## 2021-09-08 DIAGNOSIS — I351 Nonrheumatic aortic (valve) insufficiency: Secondary | ICD-10-CM | POA: Diagnosis not present

## 2021-09-08 DIAGNOSIS — J449 Chronic obstructive pulmonary disease, unspecified: Secondary | ICD-10-CM | POA: Diagnosis not present

## 2021-09-08 DIAGNOSIS — I10 Essential (primary) hypertension: Secondary | ICD-10-CM | POA: Diagnosis not present

## 2021-09-08 DIAGNOSIS — J40 Bronchitis, not specified as acute or chronic: Secondary | ICD-10-CM | POA: Diagnosis not present

## 2021-09-08 DIAGNOSIS — I509 Heart failure, unspecified: Secondary | ICD-10-CM | POA: Diagnosis not present

## 2021-09-08 DIAGNOSIS — I429 Cardiomyopathy, unspecified: Secondary | ICD-10-CM | POA: Diagnosis not present

## 2021-09-08 DIAGNOSIS — J439 Emphysema, unspecified: Secondary | ICD-10-CM | POA: Diagnosis not present

## 2021-09-11 LAB — CULTURE, URINE COMPREHENSIVE

## 2021-09-25 DIAGNOSIS — M170 Bilateral primary osteoarthritis of knee: Secondary | ICD-10-CM | POA: Diagnosis not present

## 2021-09-29 DIAGNOSIS — I2510 Atherosclerotic heart disease of native coronary artery without angina pectoris: Secondary | ICD-10-CM | POA: Diagnosis not present

## 2021-09-29 DIAGNOSIS — I10 Essential (primary) hypertension: Secondary | ICD-10-CM | POA: Diagnosis not present

## 2021-09-29 DIAGNOSIS — R609 Edema, unspecified: Secondary | ICD-10-CM | POA: Diagnosis not present

## 2021-09-29 DIAGNOSIS — J40 Bronchitis, not specified as acute or chronic: Secondary | ICD-10-CM | POA: Diagnosis not present

## 2021-09-29 DIAGNOSIS — I340 Nonrheumatic mitral (valve) insufficiency: Secondary | ICD-10-CM | POA: Diagnosis not present

## 2021-09-29 DIAGNOSIS — I509 Heart failure, unspecified: Secondary | ICD-10-CM | POA: Diagnosis not present

## 2021-09-29 DIAGNOSIS — I429 Cardiomyopathy, unspecified: Secondary | ICD-10-CM | POA: Diagnosis not present

## 2021-09-29 DIAGNOSIS — R0602 Shortness of breath: Secondary | ICD-10-CM | POA: Diagnosis not present

## 2021-09-29 DIAGNOSIS — J449 Chronic obstructive pulmonary disease, unspecified: Secondary | ICD-10-CM | POA: Diagnosis not present

## 2021-09-29 DIAGNOSIS — E039 Hypothyroidism, unspecified: Secondary | ICD-10-CM | POA: Diagnosis not present

## 2021-10-09 DIAGNOSIS — J439 Emphysema, unspecified: Secondary | ICD-10-CM | POA: Diagnosis not present

## 2021-11-06 DIAGNOSIS — J439 Emphysema, unspecified: Secondary | ICD-10-CM | POA: Diagnosis not present

## 2021-12-04 DIAGNOSIS — I340 Nonrheumatic mitral (valve) insufficiency: Secondary | ICD-10-CM | POA: Diagnosis not present

## 2021-12-04 DIAGNOSIS — E039 Hypothyroidism, unspecified: Secondary | ICD-10-CM | POA: Diagnosis not present

## 2021-12-04 DIAGNOSIS — I429 Cardiomyopathy, unspecified: Secondary | ICD-10-CM | POA: Diagnosis not present

## 2021-12-04 DIAGNOSIS — J449 Chronic obstructive pulmonary disease, unspecified: Secondary | ICD-10-CM | POA: Diagnosis not present

## 2021-12-04 DIAGNOSIS — I10 Essential (primary) hypertension: Secondary | ICD-10-CM | POA: Diagnosis not present

## 2021-12-04 DIAGNOSIS — R079 Chest pain, unspecified: Secondary | ICD-10-CM | POA: Diagnosis not present

## 2021-12-04 DIAGNOSIS — J40 Bronchitis, not specified as acute or chronic: Secondary | ICD-10-CM | POA: Diagnosis not present

## 2021-12-04 DIAGNOSIS — R609 Edema, unspecified: Secondary | ICD-10-CM | POA: Diagnosis not present

## 2021-12-04 DIAGNOSIS — I2510 Atherosclerotic heart disease of native coronary artery without angina pectoris: Secondary | ICD-10-CM | POA: Diagnosis not present

## 2021-12-04 DIAGNOSIS — E782 Mixed hyperlipidemia: Secondary | ICD-10-CM | POA: Diagnosis not present

## 2021-12-04 DIAGNOSIS — G4730 Sleep apnea, unspecified: Secondary | ICD-10-CM | POA: Diagnosis not present

## 2021-12-07 DIAGNOSIS — J439 Emphysema, unspecified: Secondary | ICD-10-CM | POA: Diagnosis not present

## 2021-12-11 DIAGNOSIS — J984 Other disorders of lung: Secondary | ICD-10-CM | POA: Diagnosis not present

## 2021-12-11 DIAGNOSIS — M170 Bilateral primary osteoarthritis of knee: Secondary | ICD-10-CM | POA: Diagnosis not present

## 2021-12-11 DIAGNOSIS — Z9181 History of falling: Secondary | ICD-10-CM | POA: Diagnosis not present

## 2021-12-11 DIAGNOSIS — E7800 Pure hypercholesterolemia, unspecified: Secondary | ICD-10-CM | POA: Diagnosis not present

## 2021-12-11 DIAGNOSIS — E079 Disorder of thyroid, unspecified: Secondary | ICD-10-CM | POA: Diagnosis not present

## 2021-12-11 DIAGNOSIS — Z9981 Dependence on supplemental oxygen: Secondary | ICD-10-CM | POA: Diagnosis not present

## 2021-12-11 DIAGNOSIS — I10 Essential (primary) hypertension: Secondary | ICD-10-CM | POA: Diagnosis not present

## 2021-12-11 DIAGNOSIS — Z7901 Long term (current) use of anticoagulants: Secondary | ICD-10-CM | POA: Diagnosis not present

## 2021-12-12 ENCOUNTER — Ambulatory Visit
Admission: RE | Admit: 2021-12-12 | Discharge: 2021-12-12 | Disposition: A | Discharge status: Home / Self Care | Payer: Medicare Other | Source: Ambulatory Visit | Attending: Urology | Admitting: Urology

## 2021-12-12 DIAGNOSIS — N281 Cyst of kidney, acquired: Secondary | ICD-10-CM | POA: Diagnosis not present

## 2021-12-13 ENCOUNTER — Telehealth: Payer: Self-pay | Admitting: Urology

## 2021-12-13 ENCOUNTER — Telehealth: Payer: Self-pay

## 2021-12-13 NOTE — Telephone Encounter (Signed)
-----   Message from Nori Riis, PA-C sent at 12/13/2021  2:01 PM EDT ----- ?Please let [name] know that everything looks good on her ultrasound of her kidneys.  The cysts in her kidneys are benign and they do not need any further follow-up. ?

## 2021-12-13 NOTE — Telephone Encounter (Signed)
Notified pt as advised, pt expressed understanding. Per Larene Beach, pt may cancel appt scheduled for 5/30, appt canceled, pt confirmed.  ?

## 2021-12-13 NOTE — Telephone Encounter (Signed)
Pt called asking about U/S results from yesterday. ?

## 2021-12-13 NOTE — Telephone Encounter (Signed)
Attempted to return pt's call, no answer, unable to leave a message. ?

## 2021-12-14 DIAGNOSIS — Z7901 Long term (current) use of anticoagulants: Secondary | ICD-10-CM | POA: Diagnosis not present

## 2021-12-14 DIAGNOSIS — E079 Disorder of thyroid, unspecified: Secondary | ICD-10-CM | POA: Diagnosis not present

## 2021-12-14 DIAGNOSIS — J984 Other disorders of lung: Secondary | ICD-10-CM | POA: Diagnosis not present

## 2021-12-14 DIAGNOSIS — Z9181 History of falling: Secondary | ICD-10-CM | POA: Diagnosis not present

## 2021-12-14 DIAGNOSIS — E7800 Pure hypercholesterolemia, unspecified: Secondary | ICD-10-CM | POA: Diagnosis not present

## 2021-12-14 DIAGNOSIS — M170 Bilateral primary osteoarthritis of knee: Secondary | ICD-10-CM | POA: Diagnosis not present

## 2021-12-14 DIAGNOSIS — I10 Essential (primary) hypertension: Secondary | ICD-10-CM | POA: Diagnosis not present

## 2021-12-14 DIAGNOSIS — Z9981 Dependence on supplemental oxygen: Secondary | ICD-10-CM | POA: Diagnosis not present

## 2021-12-18 DIAGNOSIS — E782 Mixed hyperlipidemia: Secondary | ICD-10-CM | POA: Diagnosis not present

## 2021-12-18 DIAGNOSIS — R609 Edema, unspecified: Secondary | ICD-10-CM | POA: Diagnosis not present

## 2021-12-18 DIAGNOSIS — I509 Heart failure, unspecified: Secondary | ICD-10-CM | POA: Diagnosis not present

## 2021-12-18 DIAGNOSIS — G4730 Sleep apnea, unspecified: Secondary | ICD-10-CM | POA: Diagnosis not present

## 2021-12-18 DIAGNOSIS — I119 Hypertensive heart disease without heart failure: Secondary | ICD-10-CM | POA: Diagnosis not present

## 2021-12-18 DIAGNOSIS — R0602 Shortness of breath: Secondary | ICD-10-CM | POA: Diagnosis not present

## 2021-12-18 DIAGNOSIS — I10 Essential (primary) hypertension: Secondary | ICD-10-CM | POA: Diagnosis not present

## 2021-12-18 DIAGNOSIS — I2510 Atherosclerotic heart disease of native coronary artery without angina pectoris: Secondary | ICD-10-CM | POA: Diagnosis not present

## 2021-12-18 DIAGNOSIS — I340 Nonrheumatic mitral (valve) insufficiency: Secondary | ICD-10-CM | POA: Diagnosis not present

## 2021-12-19 DIAGNOSIS — E079 Disorder of thyroid, unspecified: Secondary | ICD-10-CM | POA: Diagnosis not present

## 2021-12-19 DIAGNOSIS — Z9981 Dependence on supplemental oxygen: Secondary | ICD-10-CM | POA: Diagnosis not present

## 2021-12-19 DIAGNOSIS — J984 Other disorders of lung: Secondary | ICD-10-CM | POA: Diagnosis not present

## 2021-12-19 DIAGNOSIS — E7800 Pure hypercholesterolemia, unspecified: Secondary | ICD-10-CM | POA: Diagnosis not present

## 2021-12-19 DIAGNOSIS — Z7901 Long term (current) use of anticoagulants: Secondary | ICD-10-CM | POA: Diagnosis not present

## 2021-12-19 DIAGNOSIS — M170 Bilateral primary osteoarthritis of knee: Secondary | ICD-10-CM | POA: Diagnosis not present

## 2021-12-19 DIAGNOSIS — Z9181 History of falling: Secondary | ICD-10-CM | POA: Diagnosis not present

## 2021-12-19 DIAGNOSIS — I10 Essential (primary) hypertension: Secondary | ICD-10-CM | POA: Diagnosis not present

## 2021-12-20 DIAGNOSIS — R079 Chest pain, unspecified: Secondary | ICD-10-CM | POA: Diagnosis not present

## 2021-12-22 DIAGNOSIS — Z9981 Dependence on supplemental oxygen: Secondary | ICD-10-CM | POA: Diagnosis not present

## 2021-12-22 DIAGNOSIS — Z9181 History of falling: Secondary | ICD-10-CM | POA: Diagnosis not present

## 2021-12-22 DIAGNOSIS — E079 Disorder of thyroid, unspecified: Secondary | ICD-10-CM | POA: Diagnosis not present

## 2021-12-22 DIAGNOSIS — I10 Essential (primary) hypertension: Secondary | ICD-10-CM | POA: Diagnosis not present

## 2021-12-22 DIAGNOSIS — M170 Bilateral primary osteoarthritis of knee: Secondary | ICD-10-CM | POA: Diagnosis not present

## 2021-12-22 DIAGNOSIS — Z7901 Long term (current) use of anticoagulants: Secondary | ICD-10-CM | POA: Diagnosis not present

## 2021-12-22 DIAGNOSIS — J984 Other disorders of lung: Secondary | ICD-10-CM | POA: Diagnosis not present

## 2021-12-22 DIAGNOSIS — E7800 Pure hypercholesterolemia, unspecified: Secondary | ICD-10-CM | POA: Diagnosis not present

## 2021-12-25 DIAGNOSIS — M170 Bilateral primary osteoarthritis of knee: Secondary | ICD-10-CM | POA: Diagnosis not present

## 2021-12-26 DIAGNOSIS — I10 Essential (primary) hypertension: Secondary | ICD-10-CM | POA: Diagnosis not present

## 2021-12-26 DIAGNOSIS — M170 Bilateral primary osteoarthritis of knee: Secondary | ICD-10-CM | POA: Diagnosis not present

## 2021-12-26 DIAGNOSIS — Z9981 Dependence on supplemental oxygen: Secondary | ICD-10-CM | POA: Diagnosis not present

## 2021-12-26 DIAGNOSIS — J984 Other disorders of lung: Secondary | ICD-10-CM | POA: Diagnosis not present

## 2021-12-26 DIAGNOSIS — Z7901 Long term (current) use of anticoagulants: Secondary | ICD-10-CM | POA: Diagnosis not present

## 2021-12-26 DIAGNOSIS — E7800 Pure hypercholesterolemia, unspecified: Secondary | ICD-10-CM | POA: Diagnosis not present

## 2021-12-26 DIAGNOSIS — E079 Disorder of thyroid, unspecified: Secondary | ICD-10-CM | POA: Diagnosis not present

## 2021-12-26 DIAGNOSIS — Z9181 History of falling: Secondary | ICD-10-CM | POA: Diagnosis not present

## 2021-12-28 DIAGNOSIS — E039 Hypothyroidism, unspecified: Secondary | ICD-10-CM | POA: Diagnosis not present

## 2021-12-28 DIAGNOSIS — J449 Chronic obstructive pulmonary disease, unspecified: Secondary | ICD-10-CM | POA: Diagnosis not present

## 2021-12-28 DIAGNOSIS — J40 Bronchitis, not specified as acute or chronic: Secondary | ICD-10-CM | POA: Diagnosis not present

## 2021-12-28 DIAGNOSIS — G4730 Sleep apnea, unspecified: Secondary | ICD-10-CM | POA: Diagnosis not present

## 2021-12-28 DIAGNOSIS — R079 Chest pain, unspecified: Secondary | ICD-10-CM | POA: Diagnosis not present

## 2021-12-28 DIAGNOSIS — R609 Edema, unspecified: Secondary | ICD-10-CM | POA: Diagnosis not present

## 2021-12-28 DIAGNOSIS — I429 Cardiomyopathy, unspecified: Secondary | ICD-10-CM | POA: Diagnosis not present

## 2021-12-28 DIAGNOSIS — I2510 Atherosclerotic heart disease of native coronary artery without angina pectoris: Secondary | ICD-10-CM | POA: Diagnosis not present

## 2021-12-28 DIAGNOSIS — I10 Essential (primary) hypertension: Secondary | ICD-10-CM | POA: Diagnosis not present

## 2021-12-28 DIAGNOSIS — I340 Nonrheumatic mitral (valve) insufficiency: Secondary | ICD-10-CM | POA: Diagnosis not present

## 2021-12-28 DIAGNOSIS — E782 Mixed hyperlipidemia: Secondary | ICD-10-CM | POA: Diagnosis not present

## 2021-12-29 DIAGNOSIS — E7800 Pure hypercholesterolemia, unspecified: Secondary | ICD-10-CM | POA: Diagnosis not present

## 2021-12-29 DIAGNOSIS — Z9981 Dependence on supplemental oxygen: Secondary | ICD-10-CM | POA: Diagnosis not present

## 2021-12-29 DIAGNOSIS — M170 Bilateral primary osteoarthritis of knee: Secondary | ICD-10-CM | POA: Diagnosis not present

## 2021-12-29 DIAGNOSIS — Z7901 Long term (current) use of anticoagulants: Secondary | ICD-10-CM | POA: Diagnosis not present

## 2021-12-29 DIAGNOSIS — I10 Essential (primary) hypertension: Secondary | ICD-10-CM | POA: Diagnosis not present

## 2021-12-29 DIAGNOSIS — Z9181 History of falling: Secondary | ICD-10-CM | POA: Diagnosis not present

## 2021-12-29 DIAGNOSIS — J984 Other disorders of lung: Secondary | ICD-10-CM | POA: Diagnosis not present

## 2021-12-29 DIAGNOSIS — E079 Disorder of thyroid, unspecified: Secondary | ICD-10-CM | POA: Diagnosis not present

## 2022-01-01 DIAGNOSIS — I10 Essential (primary) hypertension: Secondary | ICD-10-CM | POA: Diagnosis not present

## 2022-01-01 DIAGNOSIS — J449 Chronic obstructive pulmonary disease, unspecified: Secondary | ICD-10-CM | POA: Diagnosis not present

## 2022-01-01 DIAGNOSIS — E782 Mixed hyperlipidemia: Secondary | ICD-10-CM | POA: Diagnosis not present

## 2022-01-01 DIAGNOSIS — E038 Other specified hypothyroidism: Secondary | ICD-10-CM | POA: Diagnosis not present

## 2022-01-01 DIAGNOSIS — I4891 Unspecified atrial fibrillation: Secondary | ICD-10-CM | POA: Diagnosis not present

## 2022-01-01 DIAGNOSIS — K219 Gastro-esophageal reflux disease without esophagitis: Secondary | ICD-10-CM | POA: Diagnosis not present

## 2022-01-02 DIAGNOSIS — Z9181 History of falling: Secondary | ICD-10-CM | POA: Diagnosis not present

## 2022-01-02 DIAGNOSIS — J984 Other disorders of lung: Secondary | ICD-10-CM | POA: Diagnosis not present

## 2022-01-02 DIAGNOSIS — Z7901 Long term (current) use of anticoagulants: Secondary | ICD-10-CM | POA: Diagnosis not present

## 2022-01-02 DIAGNOSIS — M170 Bilateral primary osteoarthritis of knee: Secondary | ICD-10-CM | POA: Diagnosis not present

## 2022-01-02 DIAGNOSIS — E079 Disorder of thyroid, unspecified: Secondary | ICD-10-CM | POA: Diagnosis not present

## 2022-01-02 DIAGNOSIS — Z9981 Dependence on supplemental oxygen: Secondary | ICD-10-CM | POA: Diagnosis not present

## 2022-01-02 DIAGNOSIS — E7800 Pure hypercholesterolemia, unspecified: Secondary | ICD-10-CM | POA: Diagnosis not present

## 2022-01-02 DIAGNOSIS — I10 Essential (primary) hypertension: Secondary | ICD-10-CM | POA: Diagnosis not present

## 2022-01-05 DIAGNOSIS — K219 Gastro-esophageal reflux disease without esophagitis: Secondary | ICD-10-CM | POA: Diagnosis not present

## 2022-01-05 DIAGNOSIS — E782 Mixed hyperlipidemia: Secondary | ICD-10-CM | POA: Diagnosis not present

## 2022-01-05 DIAGNOSIS — J449 Chronic obstructive pulmonary disease, unspecified: Secondary | ICD-10-CM | POA: Diagnosis not present

## 2022-01-05 DIAGNOSIS — N390 Urinary tract infection, site not specified: Secondary | ICD-10-CM | POA: Diagnosis not present

## 2022-01-05 DIAGNOSIS — I4891 Unspecified atrial fibrillation: Secondary | ICD-10-CM | POA: Diagnosis not present

## 2022-01-05 DIAGNOSIS — I10 Essential (primary) hypertension: Secondary | ICD-10-CM | POA: Diagnosis not present

## 2022-01-05 DIAGNOSIS — E038 Other specified hypothyroidism: Secondary | ICD-10-CM | POA: Diagnosis not present

## 2022-01-06 DIAGNOSIS — J439 Emphysema, unspecified: Secondary | ICD-10-CM | POA: Diagnosis not present

## 2022-01-09 DIAGNOSIS — M170 Bilateral primary osteoarthritis of knee: Secondary | ICD-10-CM | POA: Diagnosis not present

## 2022-01-09 DIAGNOSIS — J984 Other disorders of lung: Secondary | ICD-10-CM | POA: Diagnosis not present

## 2022-01-09 DIAGNOSIS — I10 Essential (primary) hypertension: Secondary | ICD-10-CM | POA: Diagnosis not present

## 2022-01-09 DIAGNOSIS — Z7901 Long term (current) use of anticoagulants: Secondary | ICD-10-CM | POA: Diagnosis not present

## 2022-01-09 DIAGNOSIS — E079 Disorder of thyroid, unspecified: Secondary | ICD-10-CM | POA: Diagnosis not present

## 2022-01-09 DIAGNOSIS — Z9181 History of falling: Secondary | ICD-10-CM | POA: Diagnosis not present

## 2022-01-09 DIAGNOSIS — E7800 Pure hypercholesterolemia, unspecified: Secondary | ICD-10-CM | POA: Diagnosis not present

## 2022-01-09 DIAGNOSIS — Z9981 Dependence on supplemental oxygen: Secondary | ICD-10-CM | POA: Diagnosis not present

## 2022-01-11 DIAGNOSIS — Z7901 Long term (current) use of anticoagulants: Secondary | ICD-10-CM | POA: Diagnosis not present

## 2022-01-11 DIAGNOSIS — Z9981 Dependence on supplemental oxygen: Secondary | ICD-10-CM | POA: Diagnosis not present

## 2022-01-11 DIAGNOSIS — E079 Disorder of thyroid, unspecified: Secondary | ICD-10-CM | POA: Diagnosis not present

## 2022-01-11 DIAGNOSIS — E7800 Pure hypercholesterolemia, unspecified: Secondary | ICD-10-CM | POA: Diagnosis not present

## 2022-01-11 DIAGNOSIS — I10 Essential (primary) hypertension: Secondary | ICD-10-CM | POA: Diagnosis not present

## 2022-01-11 DIAGNOSIS — M170 Bilateral primary osteoarthritis of knee: Secondary | ICD-10-CM | POA: Diagnosis not present

## 2022-01-11 DIAGNOSIS — J984 Other disorders of lung: Secondary | ICD-10-CM | POA: Diagnosis not present

## 2022-01-11 DIAGNOSIS — Z9181 History of falling: Secondary | ICD-10-CM | POA: Diagnosis not present

## 2022-01-12 DIAGNOSIS — M170 Bilateral primary osteoarthritis of knee: Secondary | ICD-10-CM | POA: Diagnosis not present

## 2022-01-12 DIAGNOSIS — Z9181 History of falling: Secondary | ICD-10-CM | POA: Diagnosis not present

## 2022-01-12 DIAGNOSIS — I10 Essential (primary) hypertension: Secondary | ICD-10-CM | POA: Diagnosis not present

## 2022-01-12 DIAGNOSIS — J984 Other disorders of lung: Secondary | ICD-10-CM | POA: Diagnosis not present

## 2022-01-12 DIAGNOSIS — Z7901 Long term (current) use of anticoagulants: Secondary | ICD-10-CM | POA: Diagnosis not present

## 2022-01-12 DIAGNOSIS — E079 Disorder of thyroid, unspecified: Secondary | ICD-10-CM | POA: Diagnosis not present

## 2022-01-12 DIAGNOSIS — Z9981 Dependence on supplemental oxygen: Secondary | ICD-10-CM | POA: Diagnosis not present

## 2022-01-12 DIAGNOSIS — E7800 Pure hypercholesterolemia, unspecified: Secondary | ICD-10-CM | POA: Diagnosis not present

## 2022-01-16 ENCOUNTER — Ambulatory Visit: Payer: Medicare Other | Admitting: Urology

## 2022-01-17 ENCOUNTER — Other Ambulatory Visit: Payer: Self-pay | Admitting: Internal Medicine

## 2022-01-17 DIAGNOSIS — Z1231 Encounter for screening mammogram for malignant neoplasm of breast: Secondary | ICD-10-CM

## 2022-01-18 DIAGNOSIS — E7800 Pure hypercholesterolemia, unspecified: Secondary | ICD-10-CM | POA: Diagnosis not present

## 2022-01-18 DIAGNOSIS — Z9981 Dependence on supplemental oxygen: Secondary | ICD-10-CM | POA: Diagnosis not present

## 2022-01-18 DIAGNOSIS — M170 Bilateral primary osteoarthritis of knee: Secondary | ICD-10-CM | POA: Diagnosis not present

## 2022-01-18 DIAGNOSIS — I10 Essential (primary) hypertension: Secondary | ICD-10-CM | POA: Diagnosis not present

## 2022-01-18 DIAGNOSIS — Z9181 History of falling: Secondary | ICD-10-CM | POA: Diagnosis not present

## 2022-01-18 DIAGNOSIS — E079 Disorder of thyroid, unspecified: Secondary | ICD-10-CM | POA: Diagnosis not present

## 2022-01-18 DIAGNOSIS — Z7901 Long term (current) use of anticoagulants: Secondary | ICD-10-CM | POA: Diagnosis not present

## 2022-01-18 DIAGNOSIS — J984 Other disorders of lung: Secondary | ICD-10-CM | POA: Diagnosis not present

## 2022-01-19 DIAGNOSIS — N390 Urinary tract infection, site not specified: Secondary | ICD-10-CM | POA: Diagnosis not present

## 2022-01-19 DIAGNOSIS — I10 Essential (primary) hypertension: Secondary | ICD-10-CM | POA: Diagnosis not present

## 2022-01-19 DIAGNOSIS — E782 Mixed hyperlipidemia: Secondary | ICD-10-CM | POA: Diagnosis not present

## 2022-01-19 DIAGNOSIS — I4891 Unspecified atrial fibrillation: Secondary | ICD-10-CM | POA: Diagnosis not present

## 2022-01-19 DIAGNOSIS — E038 Other specified hypothyroidism: Secondary | ICD-10-CM | POA: Diagnosis not present

## 2022-01-25 DIAGNOSIS — J984 Other disorders of lung: Secondary | ICD-10-CM | POA: Diagnosis not present

## 2022-01-25 DIAGNOSIS — Z9981 Dependence on supplemental oxygen: Secondary | ICD-10-CM | POA: Diagnosis not present

## 2022-01-25 DIAGNOSIS — Z9181 History of falling: Secondary | ICD-10-CM | POA: Diagnosis not present

## 2022-01-25 DIAGNOSIS — I10 Essential (primary) hypertension: Secondary | ICD-10-CM | POA: Diagnosis not present

## 2022-01-25 DIAGNOSIS — M170 Bilateral primary osteoarthritis of knee: Secondary | ICD-10-CM | POA: Diagnosis not present

## 2022-01-25 DIAGNOSIS — E7800 Pure hypercholesterolemia, unspecified: Secondary | ICD-10-CM | POA: Diagnosis not present

## 2022-01-25 DIAGNOSIS — E079 Disorder of thyroid, unspecified: Secondary | ICD-10-CM | POA: Diagnosis not present

## 2022-01-25 DIAGNOSIS — Z7901 Long term (current) use of anticoagulants: Secondary | ICD-10-CM | POA: Diagnosis not present

## 2022-01-30 DIAGNOSIS — R609 Edema, unspecified: Secondary | ICD-10-CM | POA: Diagnosis not present

## 2022-01-30 DIAGNOSIS — J449 Chronic obstructive pulmonary disease, unspecified: Secondary | ICD-10-CM | POA: Diagnosis not present

## 2022-01-30 DIAGNOSIS — E038 Other specified hypothyroidism: Secondary | ICD-10-CM | POA: Diagnosis not present

## 2022-01-30 DIAGNOSIS — I10 Essential (primary) hypertension: Secondary | ICD-10-CM | POA: Diagnosis not present

## 2022-01-30 DIAGNOSIS — I340 Nonrheumatic mitral (valve) insufficiency: Secondary | ICD-10-CM | POA: Diagnosis not present

## 2022-01-30 DIAGNOSIS — R079 Chest pain, unspecified: Secondary | ICD-10-CM | POA: Diagnosis not present

## 2022-01-30 DIAGNOSIS — I429 Cardiomyopathy, unspecified: Secondary | ICD-10-CM | POA: Diagnosis not present

## 2022-01-30 DIAGNOSIS — J40 Bronchitis, not specified as acute or chronic: Secondary | ICD-10-CM | POA: Diagnosis not present

## 2022-01-30 DIAGNOSIS — E782 Mixed hyperlipidemia: Secondary | ICD-10-CM | POA: Diagnosis not present

## 2022-01-30 DIAGNOSIS — I2510 Atherosclerotic heart disease of native coronary artery without angina pectoris: Secondary | ICD-10-CM | POA: Diagnosis not present

## 2022-01-31 DIAGNOSIS — Z7901 Long term (current) use of anticoagulants: Secondary | ICD-10-CM | POA: Diagnosis not present

## 2022-01-31 DIAGNOSIS — Z9181 History of falling: Secondary | ICD-10-CM | POA: Diagnosis not present

## 2022-01-31 DIAGNOSIS — M170 Bilateral primary osteoarthritis of knee: Secondary | ICD-10-CM | POA: Diagnosis not present

## 2022-01-31 DIAGNOSIS — Z9981 Dependence on supplemental oxygen: Secondary | ICD-10-CM | POA: Diagnosis not present

## 2022-01-31 DIAGNOSIS — I10 Essential (primary) hypertension: Secondary | ICD-10-CM | POA: Diagnosis not present

## 2022-01-31 DIAGNOSIS — E079 Disorder of thyroid, unspecified: Secondary | ICD-10-CM | POA: Diagnosis not present

## 2022-01-31 DIAGNOSIS — E7800 Pure hypercholesterolemia, unspecified: Secondary | ICD-10-CM | POA: Diagnosis not present

## 2022-01-31 DIAGNOSIS — J984 Other disorders of lung: Secondary | ICD-10-CM | POA: Diagnosis not present

## 2022-02-06 DIAGNOSIS — E7800 Pure hypercholesterolemia, unspecified: Secondary | ICD-10-CM | POA: Diagnosis not present

## 2022-02-06 DIAGNOSIS — Z9981 Dependence on supplemental oxygen: Secondary | ICD-10-CM | POA: Diagnosis not present

## 2022-02-06 DIAGNOSIS — I10 Essential (primary) hypertension: Secondary | ICD-10-CM | POA: Diagnosis not present

## 2022-02-06 DIAGNOSIS — Z9181 History of falling: Secondary | ICD-10-CM | POA: Diagnosis not present

## 2022-02-06 DIAGNOSIS — J984 Other disorders of lung: Secondary | ICD-10-CM | POA: Diagnosis not present

## 2022-02-06 DIAGNOSIS — E079 Disorder of thyroid, unspecified: Secondary | ICD-10-CM | POA: Diagnosis not present

## 2022-02-06 DIAGNOSIS — M170 Bilateral primary osteoarthritis of knee: Secondary | ICD-10-CM | POA: Diagnosis not present

## 2022-02-06 DIAGNOSIS — Z7901 Long term (current) use of anticoagulants: Secondary | ICD-10-CM | POA: Diagnosis not present

## 2022-02-06 DIAGNOSIS — J439 Emphysema, unspecified: Secondary | ICD-10-CM | POA: Diagnosis not present

## 2022-02-16 DIAGNOSIS — I10 Essential (primary) hypertension: Secondary | ICD-10-CM | POA: Diagnosis not present

## 2022-02-16 DIAGNOSIS — M170 Bilateral primary osteoarthritis of knee: Secondary | ICD-10-CM | POA: Diagnosis not present

## 2022-02-16 DIAGNOSIS — E079 Disorder of thyroid, unspecified: Secondary | ICD-10-CM | POA: Diagnosis not present

## 2022-02-16 DIAGNOSIS — Z9981 Dependence on supplemental oxygen: Secondary | ICD-10-CM | POA: Diagnosis not present

## 2022-02-16 DIAGNOSIS — J984 Other disorders of lung: Secondary | ICD-10-CM | POA: Diagnosis not present

## 2022-02-16 DIAGNOSIS — Z9181 History of falling: Secondary | ICD-10-CM | POA: Diagnosis not present

## 2022-02-16 DIAGNOSIS — E7800 Pure hypercholesterolemia, unspecified: Secondary | ICD-10-CM | POA: Diagnosis not present

## 2022-02-16 DIAGNOSIS — Z7901 Long term (current) use of anticoagulants: Secondary | ICD-10-CM | POA: Diagnosis not present

## 2022-02-20 DIAGNOSIS — I10 Essential (primary) hypertension: Secondary | ICD-10-CM | POA: Diagnosis not present

## 2022-02-20 DIAGNOSIS — E7800 Pure hypercholesterolemia, unspecified: Secondary | ICD-10-CM | POA: Diagnosis not present

## 2022-02-20 DIAGNOSIS — J984 Other disorders of lung: Secondary | ICD-10-CM | POA: Diagnosis not present

## 2022-02-20 DIAGNOSIS — E079 Disorder of thyroid, unspecified: Secondary | ICD-10-CM | POA: Diagnosis not present

## 2022-02-20 DIAGNOSIS — Z7901 Long term (current) use of anticoagulants: Secondary | ICD-10-CM | POA: Diagnosis not present

## 2022-02-20 DIAGNOSIS — Z9981 Dependence on supplemental oxygen: Secondary | ICD-10-CM | POA: Diagnosis not present

## 2022-02-20 DIAGNOSIS — Z9181 History of falling: Secondary | ICD-10-CM | POA: Diagnosis not present

## 2022-02-20 DIAGNOSIS — M170 Bilateral primary osteoarthritis of knee: Secondary | ICD-10-CM | POA: Diagnosis not present

## 2022-02-27 DIAGNOSIS — I10 Essential (primary) hypertension: Secondary | ICD-10-CM | POA: Diagnosis not present

## 2022-02-27 DIAGNOSIS — Z9181 History of falling: Secondary | ICD-10-CM | POA: Diagnosis not present

## 2022-02-27 DIAGNOSIS — Z7901 Long term (current) use of anticoagulants: Secondary | ICD-10-CM | POA: Diagnosis not present

## 2022-02-27 DIAGNOSIS — M170 Bilateral primary osteoarthritis of knee: Secondary | ICD-10-CM | POA: Diagnosis not present

## 2022-02-27 DIAGNOSIS — J984 Other disorders of lung: Secondary | ICD-10-CM | POA: Diagnosis not present

## 2022-02-27 DIAGNOSIS — E079 Disorder of thyroid, unspecified: Secondary | ICD-10-CM | POA: Diagnosis not present

## 2022-02-27 DIAGNOSIS — E7800 Pure hypercholesterolemia, unspecified: Secondary | ICD-10-CM | POA: Diagnosis not present

## 2022-02-27 DIAGNOSIS — Z9981 Dependence on supplemental oxygen: Secondary | ICD-10-CM | POA: Diagnosis not present

## 2022-03-01 DIAGNOSIS — R072 Precordial pain: Secondary | ICD-10-CM | POA: Diagnosis not present

## 2022-03-02 DIAGNOSIS — J449 Chronic obstructive pulmonary disease, unspecified: Secondary | ICD-10-CM | POA: Diagnosis not present

## 2022-03-02 DIAGNOSIS — I10 Essential (primary) hypertension: Secondary | ICD-10-CM | POA: Diagnosis not present

## 2022-03-02 DIAGNOSIS — I429 Cardiomyopathy, unspecified: Secondary | ICD-10-CM | POA: Diagnosis not present

## 2022-03-02 DIAGNOSIS — E782 Mixed hyperlipidemia: Secondary | ICD-10-CM | POA: Diagnosis not present

## 2022-03-02 DIAGNOSIS — J40 Bronchitis, not specified as acute or chronic: Secondary | ICD-10-CM | POA: Diagnosis not present

## 2022-03-02 DIAGNOSIS — I340 Nonrheumatic mitral (valve) insufficiency: Secondary | ICD-10-CM | POA: Diagnosis not present

## 2022-03-02 DIAGNOSIS — R609 Edema, unspecified: Secondary | ICD-10-CM | POA: Diagnosis not present

## 2022-03-02 DIAGNOSIS — E038 Other specified hypothyroidism: Secondary | ICD-10-CM | POA: Diagnosis not present

## 2022-03-02 DIAGNOSIS — G4730 Sleep apnea, unspecified: Secondary | ICD-10-CM | POA: Diagnosis not present

## 2022-03-02 DIAGNOSIS — I2510 Atherosclerotic heart disease of native coronary artery without angina pectoris: Secondary | ICD-10-CM | POA: Diagnosis not present

## 2022-03-05 DIAGNOSIS — E079 Disorder of thyroid, unspecified: Secondary | ICD-10-CM | POA: Diagnosis not present

## 2022-03-05 DIAGNOSIS — M170 Bilateral primary osteoarthritis of knee: Secondary | ICD-10-CM | POA: Diagnosis not present

## 2022-03-05 DIAGNOSIS — E7800 Pure hypercholesterolemia, unspecified: Secondary | ICD-10-CM | POA: Diagnosis not present

## 2022-03-05 DIAGNOSIS — Z7901 Long term (current) use of anticoagulants: Secondary | ICD-10-CM | POA: Diagnosis not present

## 2022-03-05 DIAGNOSIS — Z9981 Dependence on supplemental oxygen: Secondary | ICD-10-CM | POA: Diagnosis not present

## 2022-03-05 DIAGNOSIS — I10 Essential (primary) hypertension: Secondary | ICD-10-CM | POA: Diagnosis not present

## 2022-03-05 DIAGNOSIS — Z9181 History of falling: Secondary | ICD-10-CM | POA: Diagnosis not present

## 2022-03-05 DIAGNOSIS — J984 Other disorders of lung: Secondary | ICD-10-CM | POA: Diagnosis not present

## 2022-03-08 DIAGNOSIS — J439 Emphysema, unspecified: Secondary | ICD-10-CM | POA: Diagnosis not present

## 2022-03-16 DIAGNOSIS — R079 Chest pain, unspecified: Secondary | ICD-10-CM | POA: Diagnosis not present

## 2022-03-16 DIAGNOSIS — E782 Mixed hyperlipidemia: Secondary | ICD-10-CM | POA: Diagnosis not present

## 2022-03-16 DIAGNOSIS — G4730 Sleep apnea, unspecified: Secondary | ICD-10-CM | POA: Diagnosis not present

## 2022-03-16 DIAGNOSIS — I429 Cardiomyopathy, unspecified: Secondary | ICD-10-CM | POA: Diagnosis not present

## 2022-03-16 DIAGNOSIS — R609 Edema, unspecified: Secondary | ICD-10-CM | POA: Diagnosis not present

## 2022-03-16 DIAGNOSIS — E038 Other specified hypothyroidism: Secondary | ICD-10-CM | POA: Diagnosis not present

## 2022-03-16 DIAGNOSIS — I10 Essential (primary) hypertension: Secondary | ICD-10-CM | POA: Diagnosis not present

## 2022-03-16 DIAGNOSIS — I340 Nonrheumatic mitral (valve) insufficiency: Secondary | ICD-10-CM | POA: Diagnosis not present

## 2022-03-16 DIAGNOSIS — R0602 Shortness of breath: Secondary | ICD-10-CM | POA: Diagnosis not present

## 2022-03-16 DIAGNOSIS — J40 Bronchitis, not specified as acute or chronic: Secondary | ICD-10-CM | POA: Diagnosis not present

## 2022-03-16 DIAGNOSIS — I2510 Atherosclerotic heart disease of native coronary artery without angina pectoris: Secondary | ICD-10-CM | POA: Diagnosis not present

## 2022-03-16 DIAGNOSIS — J449 Chronic obstructive pulmonary disease, unspecified: Secondary | ICD-10-CM | POA: Diagnosis not present

## 2022-03-30 DIAGNOSIS — R0602 Shortness of breath: Secondary | ICD-10-CM | POA: Diagnosis not present

## 2022-03-30 DIAGNOSIS — I440 Atrioventricular block, first degree: Secondary | ICD-10-CM | POA: Diagnosis not present

## 2022-03-30 DIAGNOSIS — R42 Dizziness and giddiness: Secondary | ICD-10-CM | POA: Diagnosis not present

## 2022-03-30 DIAGNOSIS — Z20822 Contact with and (suspected) exposure to covid-19: Secondary | ICD-10-CM | POA: Diagnosis not present

## 2022-03-30 DIAGNOSIS — R9431 Abnormal electrocardiogram [ECG] [EKG]: Secondary | ICD-10-CM | POA: Diagnosis not present

## 2022-03-30 DIAGNOSIS — I444 Left anterior fascicular block: Secondary | ICD-10-CM | POA: Diagnosis not present

## 2022-03-30 DIAGNOSIS — Z79899 Other long term (current) drug therapy: Secondary | ICD-10-CM | POA: Diagnosis not present

## 2022-03-30 DIAGNOSIS — R5383 Other fatigue: Secondary | ICD-10-CM | POA: Diagnosis not present

## 2022-03-30 DIAGNOSIS — E039 Hypothyroidism, unspecified: Secondary | ICD-10-CM | POA: Diagnosis not present

## 2022-03-30 DIAGNOSIS — Z880 Allergy status to penicillin: Secondary | ICD-10-CM | POA: Diagnosis not present

## 2022-03-30 DIAGNOSIS — R600 Localized edema: Secondary | ICD-10-CM | POA: Diagnosis not present

## 2022-03-30 DIAGNOSIS — H538 Other visual disturbances: Secondary | ICD-10-CM | POA: Diagnosis not present

## 2022-03-30 DIAGNOSIS — R202 Paresthesia of skin: Secondary | ICD-10-CM | POA: Diagnosis not present

## 2022-03-30 DIAGNOSIS — K219 Gastro-esophageal reflux disease without esophagitis: Secondary | ICD-10-CM | POA: Diagnosis not present

## 2022-03-30 DIAGNOSIS — R200 Anesthesia of skin: Secondary | ICD-10-CM | POA: Diagnosis not present

## 2022-03-30 DIAGNOSIS — Z882 Allergy status to sulfonamides status: Secondary | ICD-10-CM | POA: Diagnosis not present

## 2022-03-30 DIAGNOSIS — R059 Cough, unspecified: Secondary | ICD-10-CM | POA: Diagnosis not present

## 2022-03-30 DIAGNOSIS — Z888 Allergy status to other drugs, medicaments and biological substances status: Secondary | ICD-10-CM | POA: Diagnosis not present

## 2022-04-02 DIAGNOSIS — M170 Bilateral primary osteoarthritis of knee: Secondary | ICD-10-CM | POA: Diagnosis not present

## 2022-04-02 DIAGNOSIS — R0602 Shortness of breath: Secondary | ICD-10-CM | POA: Diagnosis not present

## 2022-04-08 DIAGNOSIS — J439 Emphysema, unspecified: Secondary | ICD-10-CM | POA: Diagnosis not present

## 2022-04-16 DIAGNOSIS — J449 Chronic obstructive pulmonary disease, unspecified: Secondary | ICD-10-CM | POA: Diagnosis not present

## 2022-04-16 DIAGNOSIS — E782 Mixed hyperlipidemia: Secondary | ICD-10-CM | POA: Diagnosis not present

## 2022-04-16 DIAGNOSIS — D508 Other iron deficiency anemias: Secondary | ICD-10-CM | POA: Diagnosis not present

## 2022-04-16 DIAGNOSIS — I4891 Unspecified atrial fibrillation: Secondary | ICD-10-CM | POA: Diagnosis not present

## 2022-04-16 DIAGNOSIS — I10 Essential (primary) hypertension: Secondary | ICD-10-CM | POA: Diagnosis not present

## 2022-04-16 DIAGNOSIS — E038 Other specified hypothyroidism: Secondary | ICD-10-CM | POA: Diagnosis not present

## 2022-04-24 DIAGNOSIS — R609 Edema, unspecified: Secondary | ICD-10-CM | POA: Diagnosis not present

## 2022-04-24 DIAGNOSIS — E782 Mixed hyperlipidemia: Secondary | ICD-10-CM | POA: Diagnosis not present

## 2022-04-24 DIAGNOSIS — E038 Other specified hypothyroidism: Secondary | ICD-10-CM | POA: Diagnosis not present

## 2022-04-24 DIAGNOSIS — I340 Nonrheumatic mitral (valve) insufficiency: Secondary | ICD-10-CM | POA: Diagnosis not present

## 2022-04-24 DIAGNOSIS — J40 Bronchitis, not specified as acute or chronic: Secondary | ICD-10-CM | POA: Diagnosis not present

## 2022-04-24 DIAGNOSIS — G4730 Sleep apnea, unspecified: Secondary | ICD-10-CM | POA: Diagnosis not present

## 2022-04-24 DIAGNOSIS — I10 Essential (primary) hypertension: Secondary | ICD-10-CM | POA: Diagnosis not present

## 2022-04-24 DIAGNOSIS — I2510 Atherosclerotic heart disease of native coronary artery without angina pectoris: Secondary | ICD-10-CM | POA: Diagnosis not present

## 2022-04-24 DIAGNOSIS — J449 Chronic obstructive pulmonary disease, unspecified: Secondary | ICD-10-CM | POA: Diagnosis not present

## 2022-04-24 DIAGNOSIS — I429 Cardiomyopathy, unspecified: Secondary | ICD-10-CM | POA: Diagnosis not present

## 2022-04-26 DIAGNOSIS — J40 Bronchitis, not specified as acute or chronic: Secondary | ICD-10-CM | POA: Diagnosis not present

## 2022-05-08 DIAGNOSIS — I340 Nonrheumatic mitral (valve) insufficiency: Secondary | ICD-10-CM | POA: Diagnosis not present

## 2022-05-08 DIAGNOSIS — E782 Mixed hyperlipidemia: Secondary | ICD-10-CM | POA: Diagnosis not present

## 2022-05-08 DIAGNOSIS — R609 Edema, unspecified: Secondary | ICD-10-CM | POA: Diagnosis not present

## 2022-05-08 DIAGNOSIS — I2510 Atherosclerotic heart disease of native coronary artery without angina pectoris: Secondary | ICD-10-CM | POA: Diagnosis not present

## 2022-05-08 DIAGNOSIS — E038 Other specified hypothyroidism: Secondary | ICD-10-CM | POA: Diagnosis not present

## 2022-05-08 DIAGNOSIS — I10 Essential (primary) hypertension: Secondary | ICD-10-CM | POA: Diagnosis not present

## 2022-05-08 DIAGNOSIS — J40 Bronchitis, not specified as acute or chronic: Secondary | ICD-10-CM | POA: Diagnosis not present

## 2022-05-08 DIAGNOSIS — J449 Chronic obstructive pulmonary disease, unspecified: Secondary | ICD-10-CM | POA: Diagnosis not present

## 2022-05-08 DIAGNOSIS — I429 Cardiomyopathy, unspecified: Secondary | ICD-10-CM | POA: Diagnosis not present

## 2022-05-08 DIAGNOSIS — G4730 Sleep apnea, unspecified: Secondary | ICD-10-CM | POA: Diagnosis not present

## 2022-05-09 DIAGNOSIS — R600 Localized edema: Secondary | ICD-10-CM | POA: Diagnosis not present

## 2022-05-09 DIAGNOSIS — J439 Emphysema, unspecified: Secondary | ICD-10-CM | POA: Diagnosis not present

## 2022-05-24 DIAGNOSIS — H35373 Puckering of macula, bilateral: Secondary | ICD-10-CM | POA: Diagnosis not present

## 2022-05-29 DIAGNOSIS — J449 Chronic obstructive pulmonary disease, unspecified: Secondary | ICD-10-CM | POA: Diagnosis not present

## 2022-05-29 DIAGNOSIS — R609 Edema, unspecified: Secondary | ICD-10-CM | POA: Diagnosis not present

## 2022-05-29 DIAGNOSIS — G4730 Sleep apnea, unspecified: Secondary | ICD-10-CM | POA: Diagnosis not present

## 2022-05-29 DIAGNOSIS — I340 Nonrheumatic mitral (valve) insufficiency: Secondary | ICD-10-CM | POA: Diagnosis not present

## 2022-05-29 DIAGNOSIS — E782 Mixed hyperlipidemia: Secondary | ICD-10-CM | POA: Diagnosis not present

## 2022-05-29 DIAGNOSIS — I429 Cardiomyopathy, unspecified: Secondary | ICD-10-CM | POA: Diagnosis not present

## 2022-05-29 DIAGNOSIS — I10 Essential (primary) hypertension: Secondary | ICD-10-CM | POA: Diagnosis not present

## 2022-05-29 DIAGNOSIS — E038 Other specified hypothyroidism: Secondary | ICD-10-CM | POA: Diagnosis not present

## 2022-05-29 DIAGNOSIS — R0602 Shortness of breath: Secondary | ICD-10-CM | POA: Diagnosis not present

## 2022-05-29 DIAGNOSIS — I2510 Atherosclerotic heart disease of native coronary artery without angina pectoris: Secondary | ICD-10-CM | POA: Diagnosis not present

## 2022-06-01 DIAGNOSIS — I10 Essential (primary) hypertension: Secondary | ICD-10-CM | POA: Diagnosis not present

## 2022-06-01 DIAGNOSIS — Z20822 Contact with and (suspected) exposure to covid-19: Secondary | ICD-10-CM | POA: Diagnosis not present

## 2022-06-01 DIAGNOSIS — J209 Acute bronchitis, unspecified: Secondary | ICD-10-CM | POA: Diagnosis not present

## 2022-06-01 DIAGNOSIS — I509 Heart failure, unspecified: Secondary | ICD-10-CM | POA: Diagnosis not present

## 2022-06-01 DIAGNOSIS — E782 Mixed hyperlipidemia: Secondary | ICD-10-CM | POA: Diagnosis not present

## 2022-06-15 DIAGNOSIS — I10 Essential (primary) hypertension: Secondary | ICD-10-CM | POA: Diagnosis not present

## 2022-06-15 DIAGNOSIS — J301 Allergic rhinitis due to pollen: Secondary | ICD-10-CM | POA: Diagnosis not present

## 2022-06-15 DIAGNOSIS — I4891 Unspecified atrial fibrillation: Secondary | ICD-10-CM | POA: Diagnosis not present

## 2022-06-15 DIAGNOSIS — E782 Mixed hyperlipidemia: Secondary | ICD-10-CM | POA: Diagnosis not present

## 2022-06-15 DIAGNOSIS — Z23 Encounter for immunization: Secondary | ICD-10-CM | POA: Diagnosis not present

## 2022-06-21 DIAGNOSIS — E038 Other specified hypothyroidism: Secondary | ICD-10-CM | POA: Diagnosis not present

## 2022-06-21 DIAGNOSIS — I2510 Atherosclerotic heart disease of native coronary artery without angina pectoris: Secondary | ICD-10-CM | POA: Diagnosis not present

## 2022-06-21 DIAGNOSIS — J40 Bronchitis, not specified as acute or chronic: Secondary | ICD-10-CM | POA: Diagnosis not present

## 2022-06-21 DIAGNOSIS — R609 Edema, unspecified: Secondary | ICD-10-CM | POA: Diagnosis not present

## 2022-06-21 DIAGNOSIS — J449 Chronic obstructive pulmonary disease, unspecified: Secondary | ICD-10-CM | POA: Diagnosis not present

## 2022-06-21 DIAGNOSIS — I340 Nonrheumatic mitral (valve) insufficiency: Secondary | ICD-10-CM | POA: Diagnosis not present

## 2022-06-21 DIAGNOSIS — G4730 Sleep apnea, unspecified: Secondary | ICD-10-CM | POA: Diagnosis not present

## 2022-06-21 DIAGNOSIS — E782 Mixed hyperlipidemia: Secondary | ICD-10-CM | POA: Diagnosis not present

## 2022-06-21 DIAGNOSIS — I429 Cardiomyopathy, unspecified: Secondary | ICD-10-CM | POA: Diagnosis not present

## 2022-06-21 DIAGNOSIS — I10 Essential (primary) hypertension: Secondary | ICD-10-CM | POA: Diagnosis not present

## 2022-07-02 DIAGNOSIS — M170 Bilateral primary osteoarthritis of knee: Secondary | ICD-10-CM | POA: Diagnosis not present

## 2022-07-20 DIAGNOSIS — E038 Other specified hypothyroidism: Secondary | ICD-10-CM | POA: Diagnosis not present

## 2022-07-20 DIAGNOSIS — I2510 Atherosclerotic heart disease of native coronary artery without angina pectoris: Secondary | ICD-10-CM | POA: Diagnosis not present

## 2022-07-20 DIAGNOSIS — J40 Bronchitis, not specified as acute or chronic: Secondary | ICD-10-CM | POA: Diagnosis not present

## 2022-07-20 DIAGNOSIS — I340 Nonrheumatic mitral (valve) insufficiency: Secondary | ICD-10-CM | POA: Diagnosis not present

## 2022-07-20 DIAGNOSIS — J449 Chronic obstructive pulmonary disease, unspecified: Secondary | ICD-10-CM | POA: Diagnosis not present

## 2022-07-20 DIAGNOSIS — G4730 Sleep apnea, unspecified: Secondary | ICD-10-CM | POA: Diagnosis not present

## 2022-07-20 DIAGNOSIS — R609 Edema, unspecified: Secondary | ICD-10-CM | POA: Diagnosis not present

## 2022-07-20 DIAGNOSIS — I10 Essential (primary) hypertension: Secondary | ICD-10-CM | POA: Diagnosis not present

## 2022-07-20 DIAGNOSIS — E782 Mixed hyperlipidemia: Secondary | ICD-10-CM | POA: Diagnosis not present

## 2022-07-20 DIAGNOSIS — I429 Cardiomyopathy, unspecified: Secondary | ICD-10-CM | POA: Diagnosis not present

## 2022-08-09 DIAGNOSIS — M25561 Pain in right knee: Secondary | ICD-10-CM | POA: Diagnosis not present

## 2022-08-09 DIAGNOSIS — M170 Bilateral primary osteoarthritis of knee: Secondary | ICD-10-CM | POA: Diagnosis not present

## 2022-08-09 DIAGNOSIS — M25562 Pain in left knee: Secondary | ICD-10-CM | POA: Diagnosis not present

## 2022-09-20 ENCOUNTER — Other Ambulatory Visit: Payer: Self-pay

## 2022-09-21 ENCOUNTER — Ambulatory Visit: Payer: Medicare Other | Admitting: Cardiovascular Disease

## 2022-09-21 ENCOUNTER — Encounter: Payer: Self-pay | Admitting: Cardiovascular Disease

## 2022-09-21 VITALS — BP 122/70 | HR 75 | Ht 61.0 in | Wt 146.0 lb

## 2022-09-21 DIAGNOSIS — R600 Localized edema: Secondary | ICD-10-CM

## 2022-09-21 DIAGNOSIS — I872 Venous insufficiency (chronic) (peripheral): Secondary | ICD-10-CM | POA: Diagnosis not present

## 2022-09-21 DIAGNOSIS — I5033 Acute on chronic diastolic (congestive) heart failure: Secondary | ICD-10-CM | POA: Insufficient documentation

## 2022-09-21 DIAGNOSIS — I10 Essential (primary) hypertension: Secondary | ICD-10-CM | POA: Diagnosis not present

## 2022-09-21 NOTE — Patient Instructions (Signed)
Return in 2 months  Take torsemide every other day.

## 2022-09-21 NOTE — Assessment & Plan Note (Signed)
Patient reports feeling well.  Non-pitting edema of both feet, family unsure if she is taking Torsemide. Recommend taking torsemide every other day. Discussed Echo results, EF recovered. Discussed importance of continuing all medications as prescribed.

## 2022-09-21 NOTE — Progress Notes (Signed)
Cardiology Office Note   Date:  09/21/2022   ID:  Kanija, Romain 1929/09/02, MRN [id number]  PCP:  Corky Downs, MD  Cardiologist:  Adrian Blackwater, MD      History of Present Illness: Abigail Strong is a 89 y.o. female who presents for  Chief Complaint  Patient presents with   Results    Patient in office for results of echo.    Patient in office to discuss echo results. Denies chest pain, shortness of breath. Mild lower extremity edema.   Congestive Heart Failure Presents for follow-up visit. Associated symptoms include edema. Pertinent negatives include no chest pain or shortness of breath. The symptoms have been stable.       Past Medical History:  Diagnosis Date   Depression    GERD (gastroesophageal reflux disease)    Hyperlipidemia    Hyperlipidemia, unspecified 01/01/2014   Hypothyroidism    Insomnia    Musculoskeletal chest pain 09/27/2016     Past Surgical History:  Procedure Laterality Date   ABDOMINAL HYSTERECTOMY     BREAST BIOPSY Bilateral 90's   benign   cataracts     COLON SURGERY     partial colectomy   intestinal blockage     NISSEN FUNDOPLICATION       Current Outpatient Medications  Medication Sig Dispense Refill   acetaminophen (TYLENOL) 500 MG tablet Take 1,000 mg by mouth every 6 (six) hours as needed for mild pain or moderate pain.     carvedilol (COREG) 3.125 MG tablet Take 3.125 mg by mouth 2 (two) times daily with a meal.     clopidogrel (PLAVIX) 75 MG tablet Take 1 tablet by mouth once daily 90 tablet 0   ELIQUIS 2.5 MG TABS tablet Take 2.5 mg by mouth 2 (two) times daily.     fenofibrate 54 MG tablet Take 54 mg by mouth daily.     furosemide (LASIX) 40 MG tablet Take 1 tablet (40 mg total) by mouth daily. (Patient taking differently: Take 40 mg by mouth daily as needed for fluid or edema.) 30 tablet 6   levothyroxine (SYNTHROID) 125 MCG tablet Take 125 mcg by mouth daily before breakfast.     omeprazole (PRILOSEC) 40  MG capsule Take 1 capsule (40 mg total) by mouth daily. 180 capsule 2   pantoprazole (PROTONIX) 40 MG tablet Take 40 mg by mouth daily.     rosuvastatin (CRESTOR) 20 MG tablet Take 20 mg by mouth daily.     sacubitril-valsartan (ENTRESTO) 24-26 MG Take 1 tablet by mouth 2 (two) times daily.     torsemide (DEMADEX) 10 MG tablet Take 10 mg by mouth daily.     albuterol (PROVENTIL) (2.5 MG/3ML) 0.083% nebulizer solution USE 1 VIAL IN NEBULIZER ONCE DAILY AS NEEDED (Patient not taking: Reported on 09/21/2022) 75 mL 0   aspirin 81 MG chewable tablet Chew 81 mg by mouth daily. (Patient not taking: Reported on 09/21/2022)     levothyroxine (SYNTHROID) 125 MCG tablet Take 1 tablet by mouth once daily (Patient not taking: Reported on 09/21/2022) 90 tablet 0   No current facility-administered medications for this visit.    Allergies:   Gabapentin, Sulfa antibiotics, and Sulfonamide derivatives    Social History:   reports that she has never smoked. She has never used smokeless tobacco. She reports that she does not drink alcohol and does not use drugs.   Family History:  family history includes Breast cancer in her maternal aunt; Cervical  cancer in her mother.    ROS:     Review of Systems  Constitutional: Negative.   HENT: Negative.    Eyes: Negative.   Respiratory: Negative.  Negative for shortness of breath.   Cardiovascular:  Positive for leg swelling. Negative for chest pain.  Gastrointestinal: Negative.   Genitourinary: Negative.   Musculoskeletal: Negative.   Skin: Negative.   Neurological: Negative.   Endo/Heme/Allergies: Negative.   Psychiatric/Behavioral: Negative.    All other systems reviewed and are negative.    All other systems are reviewed and negative.    PHYSICAL EXAM: VS:  BP 122/70 (BP Location: Left Arm, Patient Position: Sitting, Cuff Size: Normal)   Pulse 75   Ht 5\' 1"  (1.549 m)   Wt 146 lb (66.2 kg)   SpO2 95%   BMI 27.59 kg/m  , BMI Body mass index is 27.59  kg/m. Last weight:  Wt Readings from Last 3 Encounters:  09/21/22 146 lb (66.2 kg)  09/07/21 146 lb (66.2 kg)  06/13/21 146 lb (66.2 kg)      Physical Exam Constitutional:      Appearance: Normal appearance.  Cardiovascular:     Rate and Rhythm: Normal rate and regular rhythm.     Heart sounds: Normal heart sounds.  Pulmonary:     Effort: Pulmonary effort is normal.     Breath sounds: Normal breath sounds.  Musculoskeletal:     Right lower leg: 1+ Edema present.     Left lower leg: 1+ Edema present.  Neurological:     Mental Status: She is alert.      EKG:  EKG is not ordered today.   Recent Labs: No results found for requested labs within last 365 days.    Lipid Panel    Component Value Date/Time   CHOL 182 04/26/2020 1606   TRIG 97 04/26/2020 1606   HDL 71 04/26/2020 1606   CHOLHDL 2.6 04/26/2020 1606   LDLCALC 92 04/26/2020 1606      Other studies Reviewed: Additional studies/ records that were reviewed today include:  Review of the above records demonstrates:    REASON FOR VISIT  Visit for: Echocardiogram/I34.0  Sex:   Female   wt= 146   lbs.  BP=100/60  Height= 62   inches.        TESTS  Imaging: Echocardiogram:  An echocardiogram in (2-d) mode was performed and in Doppler mode with color flow velocity mapping was performed. The aortic valve cusps are abnormal 1.6  cm, flow velocity 1.4  m/s, and systolic calculated mean flow gradient 4   mmHg. Mitral valve diastolic peak flow velocity E .43   m/s and E/A ratio 0.7. Aortic root diameter 2.6   cm. The LVOT internal diameter 1.9   cm and flow velocity was abnormal .95  m/s. LV systolic dimension 3.4   cm, diastolic 4.8   cm, posterior wall thickness 1.1   cm, fractional shortening 25 %, and EF 55-60  %. IVS thickness 0.8   cm. LA dimension 3.4 cm  RIGHT atrium= 12.7   cm2. Mitral Valve has Trace to Mild Regurgitation. Aortic Valve has Trace Regurgitation. Pulmonic Valve has Trace  Regurgitation. Tricuspid Valve has Trace to Mild Regurgitation.     ASSESSMENT  Technically adequate study.  Normal chamber sizes.  Mild left ventricular hypertrophy with GRADE 1 (relaxation abnormality) diastolic dysfunction.  Normal right ventricular systolic function.  Normal right ventricular diastolic function.  Normal left ventricular wall motion.  Normal right ventricular  wall motion.  Trace pulmonary regurgitation.  Trace to Mild tricuspid regurgitation.  Normal pulmonary artery pressure.  Trace to Mild mitral regurgitation.  Trace aortic regurgitation.  No pericardial effusion.  No LVH.     THERAPY   Referring physician: Laurier Nancy  Sonographer: STU.   Adrian Blackwater MD  Electronically signed by: Adrian Blackwater     Date: 09/13/2022 12:45   REASON FOR VISIT  Referred by Dr.Thia Olesen [place] Flakes.        TESTS  Imaging: Computed Tomographic Angiography:  Cardiac multidetector CT was performed paying particular attention to the coronary arteries for the diagnosis of: Chest Pain. Diagnostic Drugs:  Administered iohexol (Omnipaque) through an antecubital vein and images from the examination were analyzed for the presence and extent of coronary artery disease, using 3D image processing software. 100 mL of non-ionic contrast (Omnipaque) was used.     TEST CONCLUSIONS  Quality of study: Good  1-Calcium score: 616  2-Right dominant system.  3-40% proximal LAD calcified, No significant disease in LCX and RCA.   Adrian Blackwater MD  Electronically signed by: Adrian Blackwater     Date: 03/06/2022 09:00   TESTS    [facility] [address] [place], [place] [address] [phone] STUDY:  Gated Stress / Rest Myocardial Perfusion Imaging Tomographic (SPECT) Including attenuation correction Wall Motion, Left Ventricular Ejection Fraction By Gated Technique.Adenosine Stress Test. SEX: Female  WEIGHT: 155 lbs  HEIGHT: 64 in   ARMS UP: YES/NO                                                                                                                                                                                 REFERRING PHYSICIAN: Dr.Juliett Eastburn [place] Flakes  INDICATION FOR STUDY: CP                                                                                                                                                                                                                     TECHNIQUE:  Approximately 20 minutes following the intravenous administration of 938 mCi of Tc-25m Sestamibi after stress testing in a reclined supine position with arms above their head if able to do so, gated SPECT imaging of the heart was performed. After about a 2hr break, the patient was injected intravenously with 32.0 mCi of Tc-54m Sestamibi.  Approximately 45 minutes later in the same position as stress imaging SPECT rest imaging of the heart was performed.  STRESS BY:  Adrian Blackwater, MD PROTOCOL:  Adenosine    DOSE ADMIN: 39.4 mg              ROUTE OF ADMINISTRATION: IV                                                                            MAX PRED HR:  131                    85%: 111               75%: 98                                                                                                                   RESTING BP: 120/72   RESTING HR: 74  PEAK BP: 120/75   PEAK HR: 92  EXERCISE DURATION:    4 min injection                                            REASON FOR TEST TERMINATION:    Protocol end                                                                                                                              SYMPTOMS:   None                                                                                                                                                                                                           EKG RESULTS: NSR. 73/min. Frequent PVC's. Low voltage. No significant ST changes with adenosine.                                                             IMAGE QUALITY: Good  PERFUSION/WALL MOTION FINDINGS: EF = 65%. Large severe reversible basal and mid inferior and inferolateral and apical inferior wall defects, inferior wall hypokinesis.                                                                           IMPRESSION:  Ischemia in the LCX/RCA territories with normal LVEF, advise further workup.                                                                                                                                                                                                                                                                                        Adrian Blackwater, MD Stress Interpreting Physician / Nuclear Interpreting Physician  Adrian Blackwater MD  Electronically signed by: Adrian Blackwater     Date: 12/22/2021 09:55     06/02/2019    1:37 PM  PAD Screen  Previous PAD dx? No  Previous surgical procedure? No  Pain with walking? Yes  Subsides with rest? No  Painful, non-healing ulcers? No  Extremities discolored? Yes      ASSESSMENT AND PLAN:   Problem List Items Addressed This Visit       Cardiovascular and Mediastinum   Essential hypertension   Relevant Medications   sacubitril-valsartan (ENTRESTO) 24-26 MG   Edema of lower leg due to peripheral venous insufficiency   Relevant Medications    sacubitril-valsartan (ENTRESTO) 24-26 MG   CHF (congestive heart failure), NYHA class III, acute on chronic, diastolic (HCC) - Primary    Patient reports feeling well.  Non-pitting edema of both feet, family unsure if she is taking Torsemide. Recommend taking torsemide every other day. Discussed Echo results, EF recovered. Discussed importance of continuing all medications as prescribed.       Relevant Medications   sacubitril-valsartan (ENTRESTO) 24-26 MG       Disposition:   Return in about  2 months (around 11/20/2022).      Signed,  Adrian Blackwater, MD  09/21/2022 1:48 PM    [facility]

## 2022-09-22 ENCOUNTER — Other Ambulatory Visit: Payer: Self-pay | Admitting: Cardiovascular Disease

## 2022-09-26 ENCOUNTER — Other Ambulatory Visit: Payer: Self-pay | Admitting: Cardiovascular Disease

## 2022-10-04 ENCOUNTER — Encounter: Payer: Self-pay | Admitting: Internal Medicine

## 2022-10-04 ENCOUNTER — Ambulatory Visit (INDEPENDENT_AMBULATORY_CARE_PROVIDER_SITE_OTHER): Payer: Medicare Other | Admitting: Internal Medicine

## 2022-10-04 ENCOUNTER — Other Ambulatory Visit: Payer: Self-pay

## 2022-10-04 VITALS — BP 102/60 | HR 85 | Ht 62.0 in | Wt 144.2 lb

## 2022-10-04 DIAGNOSIS — J441 Chronic obstructive pulmonary disease with (acute) exacerbation: Secondary | ICD-10-CM | POA: Diagnosis not present

## 2022-10-04 DIAGNOSIS — I2510 Atherosclerotic heart disease of native coronary artery without angina pectoris: Secondary | ICD-10-CM

## 2022-10-04 DIAGNOSIS — I10 Essential (primary) hypertension: Secondary | ICD-10-CM

## 2022-10-04 DIAGNOSIS — I482 Chronic atrial fibrillation, unspecified: Secondary | ICD-10-CM

## 2022-10-04 DIAGNOSIS — R413 Other amnesia: Secondary | ICD-10-CM

## 2022-10-04 DIAGNOSIS — N390 Urinary tract infection, site not specified: Secondary | ICD-10-CM

## 2022-10-04 DIAGNOSIS — E038 Other specified hypothyroidism: Secondary | ICD-10-CM

## 2022-10-04 DIAGNOSIS — K219 Gastro-esophageal reflux disease without esophagitis: Secondary | ICD-10-CM

## 2022-10-04 DIAGNOSIS — J9601 Acute respiratory failure with hypoxia: Secondary | ICD-10-CM

## 2022-10-04 DIAGNOSIS — F419 Anxiety disorder, unspecified: Secondary | ICD-10-CM

## 2022-10-04 DIAGNOSIS — F32A Depression, unspecified: Secondary | ICD-10-CM

## 2022-10-04 DIAGNOSIS — E782 Mixed hyperlipidemia: Secondary | ICD-10-CM

## 2022-10-04 LAB — POCT URINALYSIS DIPSTICK
Bilirubin, UA: NEGATIVE
Blood, UA: NEGATIVE
Glucose, UA: NEGATIVE
Ketones, UA: NEGATIVE
Leukocytes, UA: NEGATIVE
Nitrite, UA: NEGATIVE
Protein, UA: NEGATIVE
Spec Grav, UA: 1.025 (ref 1.010–1.025)
Urobilinogen, UA: 0.2 E.U./dL
pH, UA: 5 (ref 5.0–8.0)

## 2022-10-04 MED ORDER — LEVOTHYROXINE SODIUM 125 MCG PO TABS: 125.0000 ug | ORAL_TABLET | Freq: Every day | ORAL | 1 refills | Status: DC

## 2022-10-04 MED ORDER — ROSUVASTATIN CALCIUM 20 MG PO TABS: 20.0000 mg | ORAL_TABLET | Freq: Every day | ORAL | 1 refills | Status: DC

## 2022-10-04 MED ORDER — RANOLAZINE ER 500 MG PO TB12: 500.0000 mg | ORAL_TABLET | Freq: Two times a day (BID) | ORAL | 1 refills | Status: DC

## 2022-10-04 MED ORDER — TORSEMIDE 10 MG PO TABS: 10.0000 mg | ORAL_TABLET | Freq: Every day | ORAL | 1 refills | Status: DC

## 2022-10-04 MED ORDER — ELIQUIS 2.5 MG PO TABS: 2.5000 mg | ORAL_TABLET | Freq: Two times a day (BID) | ORAL | 6 refills | Status: DC

## 2022-10-04 MED ORDER — PANTOPRAZOLE SODIUM 40 MG PO TBEC: 40.0000 mg | DELAYED_RELEASE_TABLET | Freq: Every day | ORAL | 1 refills | Status: DC

## 2022-10-04 MED ORDER — PANTOPRAZOLE SODIUM 40 MG PO TBEC: 40.0000 mg | DELAYED_RELEASE_TABLET | Freq: Every day | ORAL | 4 refills | Status: DC

## 2022-10-04 MED ORDER — HYDROXYZINE HCL 25 MG PO TABS
25.0000 mg | ORAL_TABLET | Freq: Four times a day (QID) | ORAL | 4 refills | Status: DC | PRN
Start: 2022-10-04 — End: 2023-05-15

## 2022-10-04 MED ORDER — DONEPEZIL HCL 5 MG PO TABS
5.0000 mg | ORAL_TABLET | Freq: Every day | ORAL | 2 refills | Status: DC
Start: 2022-10-04 — End: 2022-11-01

## 2022-10-04 MED ORDER — ENTRESTO 24-26 MG PO TABS: 1.0000 | ORAL_TABLET | Freq: Two times a day (BID) | ORAL | 1 refills | Status: DC

## 2022-10-04 NOTE — Progress Notes (Signed)
Established Patient Office Visit  Subjective:  Patient ID: Abigail Strong, female    DOB: Sep 17, 1929  Age: 89 y.o. MRN: [id number]  Chief Complaint  Patient presents with   Follow-up    Possible UTI & rx questions    Patient comes in for her follow-up today she initially thought that she is having some discomfort during urination.  We did a urine dipstick in office and it was entirely clear.  Patient reports that she is running she has run out of all her medications and needs refills on those.  She is also worried that she is becoming more forgetful and would like to start something to help with her memory.  Will try a small dose Aricept for her. Patient is fasting for blood work which we will check today. Otherwise patient feels quite well.     Past Medical History:  Diagnosis Date   Depression    GERD (gastroesophageal reflux disease)    Hyperlipidemia    Hyperlipidemia, unspecified 01/01/2014   Hypothyroidism    Insomnia    Musculoskeletal chest pain 09/27/2016    Social History   Socioeconomic History   Marital status: Married    Spouse name: Not on file   Number of children: Not on file   Years of education: Not on file   Highest education level: Not on file  Occupational History   Not on file  Tobacco Use   Smoking status: Never   Smokeless tobacco: Never  Substance and Sexual Activity   Alcohol use: No   Drug use: No   Sexual activity: Not Currently    Birth control/protection: Post-menopausal  Other Topics Concern   Not on file  Social History Narrative   Not on file   Social Determinants of Health   Financial Resource Strain: High Risk (05/04/2021)   Overall Financial Resource Strain (CARDIA)    Difficulty of Paying Living Expenses: Very hard  Food Insecurity: No Food Insecurity (05/04/2021)   Hunger Vital Sign    Worried About Running Out of Food in the Last Year: Never true    Ran Out of Food in the Last Year: Never true  Transportation Needs:  No Transportation Needs (05/04/2021)   PRAPARE - Administrator, Civil Service (Medical): No    Lack of Transportation (Non-Medical): No  Physical Activity: Inactive (05/04/2021)   Exercise Vital Sign    Days of Exercise per Week: 0 days    Minutes of Exercise per Session: 0 min  Stress: No Stress Concern Present (05/04/2021)   [employer] of Occupational Health - Occupational Stress Questionnaire    Feeling of Stress : Only a little  Social Connections: Moderately Integrated (05/04/2021)   Social Connection and Isolation Panel [NHANES]    Frequency of Communication with Friends and Family: More than three times a week    Frequency of Social Gatherings with Friends and Family: More than three times a week    Attends Religious Services: 1 to 4 times per year    Active Member of [employer] or Organizations: Yes    Attends Banker Meetings: 1 to 4 times per year    Marital Status: Widowed  Intimate Partner Violence: Not At Risk (05/04/2021)   Humiliation, Afraid, Rape, and Kick questionnaire    Fear of Current or Ex-Partner: No    Emotionally Abused: No    Physically Abused: No    Sexually Abused: No    Family History  Problem Relation Age of  Onset   Cervical cancer Mother    Breast cancer Maternal Aunt     Allergies  Allergen Reactions   Gabapentin Other (See Comments)    Dizziness    Sulfa Antibiotics Other (See Comments)   Sulfonamide Derivatives     Review of Systems  Constitutional:  Negative for chills, diaphoresis, fever, malaise/fatigue and weight loss.  HENT:  Positive for hearing loss. Negative for congestion, ear pain and tinnitus.   Eyes:  Negative for photophobia, pain and discharge.  Respiratory:  Negative for cough, sputum production, shortness of breath and wheezing.   Cardiovascular:  Negative for chest pain, palpitations, orthopnea, leg swelling and PND.  Genitourinary:  Positive for dysuria. Negative for flank pain, frequency and  urgency.  Musculoskeletal:  Negative for back pain, joint pain, myalgias and neck pain.  Skin:  Negative for itching and rash.  Neurological:  Negative for dizziness, tingling, focal weakness, seizures, weakness and headaches.  Psychiatric/Behavioral:  Positive for memory loss. Negative for depression. The patient is not nervous/anxious.        Objective:   BP 102/60   Pulse 85   Ht 5\' 2"  (1.575 m)   Wt 144 lb 3.2 oz (65.4 kg)   SpO2 97%   BMI 26.37 kg/m   Vitals:   10/04/22 1102  BP: 102/60  Pulse: 85  Height: 5\' 2"  (1.575 m)  Weight: 144 lb 3.2 oz (65.4 kg)  SpO2: 97%  BMI (Calculated): 26.37    Physical Exam Vitals and nursing note reviewed.  Constitutional:      Appearance: Normal appearance.  Cardiovascular:     Rate and Rhythm: Normal rate and regular rhythm.  Pulmonary:     Effort: Pulmonary effort is normal.     Breath sounds: Normal breath sounds.  Abdominal:     General: Abdomen is flat. Bowel sounds are normal.     Palpations: Abdomen is soft.  Musculoskeletal:     Cervical back: Normal range of motion and neck supple.  Neurological:     General: No focal deficit present.     Mental Status: She is alert.  Psychiatric:        Mood and Affect: Mood normal.        Thought Content: Thought content normal.      Results for orders placed or performed in visit on 10/04/22  POCT Urinalysis Dipstick (81002)  Result Value Ref Range   Color, UA     Clarity, UA Slightly Cloudy    Glucose, UA Negative Negative   Bilirubin, UA Negative    Ketones, UA Negative    Spec Grav, UA 1.025 1.010 - 1.025   Blood, UA Negative    pH, UA 5.0 5.0 - 8.0   Protein, UA Negative Negative   Urobilinogen, UA 0.2 0.2 or 1.0 E.U./dL   Nitrite, UA Negative    Leukocytes, UA Negative Negative   Appearance     Odor      Recent Results (from the past 2160 hour(s))  POCT Urinalysis Dipstick (59563)     Status: Normal   Collection Time: 10/04/22 11:05 AM  Result Value Ref  Range   Color, UA     Clarity, UA Slightly Cloudy    Glucose, UA Negative Negative   Bilirubin, UA Negative    Ketones, UA Negative    Spec Grav, UA 1.025 1.010 - 1.025   Blood, UA Negative    pH, UA 5.0 5.0 - 8.0   Protein, UA Negative Negative  Urobilinogen, UA 0.2 0.2 or 1.0 E.U./dL   Nitrite, UA Negative    Leukocytes, UA Negative Negative   Appearance     Odor        Assessment & Plan:   Problem List Items Addressed This Visit     Essential hypertension   Relevant Medications   ELIQUIS 2.5 MG TABS tablet   ranolazine (RANEXA) 500 MG 12 hr tablet   rosuvastatin (CRESTOR) 20 MG tablet   sacubitril-valsartan (ENTRESTO) 24-26 MG   torsemide (DEMADEX) 10 MG tablet   Other Relevant Orders   CMP14+EGFR   Acute respiratory failure with hypoxia (HCC)   COPD exacerbation (HCC)   GERD (gastroesophageal reflux disease)   Relevant Medications   pantoprazole (PROTONIX) 40 MG tablet   Mixed hyperlipidemia   Relevant Medications   ELIQUIS 2.5 MG TABS tablet   ranolazine (RANEXA) 500 MG 12 hr tablet   rosuvastatin (CRESTOR) 20 MG tablet   sacubitril-valsartan (ENTRESTO) 24-26 MG   torsemide (DEMADEX) 10 MG tablet   Other Relevant Orders   Lipid Panel w/o Chol/HDL Ratio   Hypothyroidism, unspecified   Relevant Medications   levothyroxine (SYNTHROID) 125 MCG tablet   Other Relevant Orders   TSH+T4F+T3Free   Gradual-onset memory impairment   Relevant Medications   donepezil (ARICEPT) 5 MG tablet   Acute lower UTI - Primary   Relevant Orders   POCT Urinalysis Dipstick (81002) (Completed)   Chronic atrial fibrillation (HCC)   Relevant Medications   ELIQUIS 2.5 MG TABS tablet   ranolazine (RANEXA) 500 MG 12 hr tablet   rosuvastatin (CRESTOR) 20 MG tablet   sacubitril-valsartan (ENTRESTO) 24-26 MG   torsemide (DEMADEX) 10 MG tablet   Other Relevant Orders   CBC With Differential   Other Visit Diagnoses     Coronary artery disease involving native coronary artery of  native heart without angina pectoris       Relevant Medications   ELIQUIS 2.5 MG TABS tablet   ranolazine (RANEXA) 500 MG 12 hr tablet   rosuvastatin (CRESTOR) 20 MG tablet   sacubitril-valsartan (ENTRESTO) 24-26 MG   torsemide (DEMADEX) 10 MG tablet   Anxiety and depression       Relevant Medications   hydrOXYzine (ATARAX) 25 MG tablet       Return in about 4 weeks (around 11/01/2022).   Total time spent: 30 minutes  Patient will return to discuss how well she is tolertaaing Aricept. Margaretann Loveless, MD  10/04/2022

## 2022-10-05 ENCOUNTER — Other Ambulatory Visit: Payer: Self-pay | Admitting: Internal Medicine

## 2022-10-05 DIAGNOSIS — E038 Other specified hypothyroidism: Secondary | ICD-10-CM

## 2022-10-05 LAB — CMP14+EGFR
ALT: 9 IU/L (ref 0–32)
AST: 17 IU/L (ref 0–40)
Albumin/Globulin Ratio: 1.7 (ref 1.2–2.2)
Albumin: 4 g/dL (ref 3.6–4.6)
Alkaline Phosphatase: 43 IU/L — ABNORMAL LOW (ref 44–121)
BUN/Creatinine Ratio: 23 (ref 12–28)
BUN: 38 mg/dL — ABNORMAL HIGH (ref 10–36)
Bilirubin Total: 0.5 mg/dL (ref 0.0–1.2)
CO2: 20 mmol/L (ref 20–29)
Calcium: 9.3 mg/dL (ref 8.7–10.3)
Chloride: 102 mmol/L (ref 96–106)
Creatinine, Ser: 1.67 mg/dL — ABNORMAL HIGH (ref 0.57–1.00)
Globulin, Total: 2.3 g/dL (ref 1.5–4.5)
Glucose: 83 mg/dL (ref 70–99)
Potassium: 5.2 mmol/L (ref 3.5–5.2)
Sodium: 137 mmol/L (ref 134–144)
Total Protein: 6.3 g/dL (ref 6.0–8.5)
eGFR: 29 mL/min/{1.73_m2} — ABNORMAL LOW (ref >59)

## 2022-10-05 LAB — LIPID PANEL W/O CHOL/HDL RATIO
Cholesterol, Total: 162 mg/dL (ref 100–199)
HDL: 82 mg/dL (ref >39)
LDL Chol Calc (NIH): 63 mg/dL (ref 0–99)
Triglycerides: 92 mg/dL (ref 0–149)
VLDL Cholesterol Cal: 17 mg/dL (ref 5–40)

## 2022-10-05 LAB — CBC WITH DIFFERENTIAL
Basophils Absolute: 0.1 10*3/uL (ref 0.0–0.2)
Basos: 1 %
EOS (ABSOLUTE): 0.2 10*3/uL (ref 0.0–0.4)
Eos: 2 %
Hematocrit: 34.4 % (ref 34.0–46.6)
Hemoglobin: 11.6 g/dL (ref 11.1–15.9)
Immature Grans (Abs): 0 10*3/uL (ref 0.0–0.1)
Immature Granulocytes: 0 %
Lymphocytes Absolute: 3.7 10*3/uL — ABNORMAL HIGH (ref 0.7–3.1)
Lymphs: 39 %
MCH: 30.1 pg (ref 26.6–33.0)
MCHC: 33.7 g/dL (ref 31.5–35.7)
MCV: 89 fL (ref 79–97)
Monocytes Absolute: 0.9 10*3/uL (ref 0.1–0.9)
Monocytes: 10 %
Neutrophils Absolute: 4.7 10*3/uL (ref 1.4–7.0)
Neutrophils: 48 %
RBC: 3.86 x10E6/uL (ref 3.77–5.28)
RDW: 13 % (ref 11.7–15.4)
WBC: 9.6 10*3/uL (ref 3.4–10.8)

## 2022-10-05 LAB — TSH+T4F+T3FREE
Free T4: 2.03 ng/dL — ABNORMAL HIGH (ref 0.82–1.77)
T3, Free: 2.4 pg/mL (ref 2.0–4.4)
TSH: 0.187 u[IU]/mL — ABNORMAL LOW (ref 0.450–4.500)

## 2022-10-05 MED ORDER — LEVOTHYROXINE SODIUM 112 MCG PO TABS: 112.0000 ug | ORAL_TABLET | Freq: Every day | ORAL | 2 refills | Status: DC

## 2022-10-08 ENCOUNTER — Other Ambulatory Visit: Payer: Self-pay | Admitting: Cardiovascular Disease

## 2022-10-09 ENCOUNTER — Other Ambulatory Visit: Payer: Self-pay

## 2022-10-09 MED ORDER — CARVEDILOL 3.125 MG PO TABS
3.1250 mg | ORAL_TABLET | Freq: Two times a day (BID) | ORAL | 3 refills | Status: DC
Start: 2022-10-09 — End: 2023-01-31

## 2022-10-09 MED ORDER — FENOFIBRATE 54 MG PO TABS: 54.0000 mg | ORAL_TABLET | Freq: Every day | ORAL | 3 refills | Status: DC

## 2022-10-18 ENCOUNTER — Telehealth: Payer: Self-pay

## 2022-10-18 NOTE — Telephone Encounter (Signed)
Patient Abigail Strong asking for call back did not state what the call was regarding

## 2022-11-01 ENCOUNTER — Encounter: Payer: Self-pay | Admitting: Internal Medicine

## 2022-11-01 ENCOUNTER — Ambulatory Visit (INDEPENDENT_AMBULATORY_CARE_PROVIDER_SITE_OTHER): Payer: Medicare Other | Admitting: Internal Medicine

## 2022-11-01 VITALS — BP 124/60 | HR 72 | Ht 60.0 in | Wt 145.4 lb

## 2022-11-01 DIAGNOSIS — E782 Mixed hyperlipidemia: Secondary | ICD-10-CM

## 2022-11-01 DIAGNOSIS — K219 Gastro-esophageal reflux disease without esophagitis: Secondary | ICD-10-CM

## 2022-11-01 DIAGNOSIS — R413 Other amnesia: Secondary | ICD-10-CM

## 2022-11-01 DIAGNOSIS — E039 Hypothyroidism, unspecified: Secondary | ICD-10-CM | POA: Diagnosis not present

## 2022-11-01 DIAGNOSIS — I5033 Acute on chronic diastolic (congestive) heart failure: Secondary | ICD-10-CM

## 2022-11-01 DIAGNOSIS — I10 Essential (primary) hypertension: Secondary | ICD-10-CM

## 2022-11-01 NOTE — Progress Notes (Signed)
Established Patient Office Visit  Subjective:  Patient ID: Abigail Strong, female    DOB: 1930-07-26  Age: 89 y.o. MRN: [id number]  Chief Complaint  Patient presents with   Follow-up    1 month follow up    Patient today comes in to discuss the effects of Aricept that was started at last visit for her memory impairment. Patient states she did not like it, it kept her awake at night, and when she did fall asleep she had some nightmares.  Patient advised to stop Aricept at this time. She is otherwise feeling quite well.  She does not offer any other complaints.     Past Medical History:  Diagnosis Date   Depression    GERD (gastroesophageal reflux disease)    Hyperlipidemia    Hyperlipidemia, unspecified 01/01/2014   Hypothyroidism    Insomnia    Musculoskeletal chest pain 09/27/2016    Past Surgical History:  Procedure Laterality Date   ABDOMINAL HYSTERECTOMY     BREAST BIOPSY Bilateral 90's   benign   cataracts     COLON SURGERY     partial colectomy   intestinal blockage     NISSEN FUNDOPLICATION      Social History   Socioeconomic History   Marital status: Married    Spouse name: Not on file   Number of children: Not on file   Years of education: Not on file   Highest education level: Not on file  Occupational History   Not on file  Tobacco Use   Smoking status: Never   Smokeless tobacco: Never  Substance and Sexual Activity   Alcohol use: No   Drug use: No   Sexual activity: Not Currently    Birth control/protection: Post-menopausal  Other Topics Concern   Not on file  Social History Narrative   Not on file   Social Determinants of Health   Financial Resource Strain: High Risk (05/04/2021)   Overall Financial Resource Strain (CARDIA)    Difficulty of Paying Living Expenses: Very hard  Food Insecurity: No Food Insecurity (05/04/2021)   Hunger Vital Sign    Worried About Running Out of Food in the Last Year: Never true    Ran Out of Food in  the Last Year: Never true  Transportation Needs: No Transportation Needs (05/04/2021)   PRAPARE - Administrator, Civil Service (Medical): No    Lack of Transportation (Non-Medical): No  Physical Activity: Inactive (05/04/2021)   Exercise Vital Sign    Days of Exercise per Week: 0 days    Minutes of Exercise per Session: 0 min  Stress: No Stress Concern Present (05/04/2021)   [employer] of Occupational Health - Occupational Stress Questionnaire    Feeling of Stress : Only a little  Social Connections: Moderately Integrated (05/04/2021)   Social Connection and Isolation Panel [NHANES]    Frequency of Communication with Friends and Family: More than three times a week    Frequency of Social Gatherings with Friends and Family: More than three times a week    Attends Religious Services: 1 to 4 times per year    Active Member of [employer] or Organizations: Yes    Attends Banker Meetings: 1 to 4 times per year    Marital Status: Widowed  Intimate Partner Violence: Not At Risk (05/04/2021)   Humiliation, Afraid, Rape, and Kick questionnaire    Fear of Current or Ex-Partner: No    Emotionally Abused: No  Physically Abused: No    Sexually Abused: No    Family History  Problem Relation Age of Onset   Cervical cancer Mother    Breast cancer Maternal Aunt     Allergies  Allergen Reactions   Gabapentin Other (See Comments)    Dizziness    Sulfa Antibiotics Other (See Comments)   Sulfonamide Derivatives     Review of Systems  Constitutional: Negative.   HENT: Negative.    Respiratory: Negative.    Cardiovascular: Negative.   Gastrointestinal: Negative.   Genitourinary: Negative.   Musculoskeletal: Negative.   Skin: Negative.   Neurological: Negative.   Endo/Heme/Allergies: Negative.   Psychiatric/Behavioral: Negative.         Objective:   BP 124/60   Pulse 72   Ht 5' (1.524 m)   Wt 145 lb 6.4 oz (66 kg)   SpO2 94%   BMI 28.40 kg/m    Vitals:   11/01/22 1255  BP: 124/60  Pulse: 72  Height: 5' (1.524 m)  Weight: 145 lb 6.4 oz (66 kg)  SpO2: 94%  BMI (Calculated): 28.4    Physical Exam Vitals and nursing note reviewed.  Constitutional:      Appearance: Normal appearance. She is normal weight.  Cardiovascular:     Rate and Rhythm: Normal rate and regular rhythm.  Pulmonary:     Effort: Pulmonary effort is normal.     Breath sounds: Normal breath sounds.  Abdominal:     General: Abdomen is flat. Bowel sounds are normal.     Palpations: Abdomen is soft.  Musculoskeletal:        General: Normal range of motion.     Cervical back: Normal range of motion and neck supple.  Skin:    General: Skin is warm and dry.  Neurological:     General: No focal deficit present.     Mental Status: She is alert and oriented to person, place, and time.  Psychiatric:        Mood and Affect: Mood normal.        Behavior: Behavior normal.      No results found for any visits on 11/01/22.  Recent Results (from the past 2160 hour(s))  POCT Urinalysis Dipstick (29528)     Status: Normal   Collection Time: 10/04/22 11:05 AM  Result Value Ref Range   Color, UA     Clarity, UA Slightly Cloudy    Glucose, UA Negative Negative   Bilirubin, UA Negative    Ketones, UA Negative    Spec Grav, UA 1.025 1.010 - 1.025   Blood, UA Negative    pH, UA 5.0 5.0 - 8.0   Protein, UA Negative Negative   Urobilinogen, UA 0.2 0.2 or 1.0 E.U./dL   Nitrite, UA Negative    Leukocytes, UA Negative Negative   Appearance     Odor    CMP14+EGFR     Status: Abnormal   Collection Time: 10/04/22 12:08 PM  Result Value Ref Range   Glucose 83 70 - 99 mg/dL   BUN 38 (H) 10 - 36 mg/dL   Creatinine, Ser 4.13 (H) 0.57 - 1.00 mg/dL   eGFR 29 (L) >24 MW/NUU/7.25   BUN/Creatinine Ratio 23 12 - 28   Sodium 137 134 - 144 mmol/L   Potassium 5.2 3.5 - 5.2 mmol/L   Chloride 102 96 - 106 mmol/L   CO2 20 20 - 29 mmol/L   Calcium 9.3 8.7 - 10.3 mg/dL    Total Protein  6.3 6.0 - 8.5 g/dL   Albumin 4.0 3.6 - 4.6 g/dL   Globulin, Total 2.3 1.5 - 4.5 g/dL   Albumin/Globulin Ratio 1.7 1.2 - 2.2   Bilirubin Total 0.5 0.0 - 1.2 mg/dL   Alkaline Phosphatase 43 (L) 44 - 121 IU/L   AST 17 0 - 40 IU/L   ALT 9 0 - 32 IU/L  CBC With Differential     Status: Abnormal   Collection Time: 10/04/22 12:08 PM  Result Value Ref Range   WBC 9.6 3.4 - 10.8 x10E3/uL   RBC 3.86 3.77 - 5.28 x10E6/uL   Hemoglobin 11.6 11.1 - 15.9 g/dL   Hematocrit 16.1 09.6 - 46.6 %   MCV 89 79 - 97 fL   MCH 30.1 26.6 - 33.0 pg   MCHC 33.7 31.5 - 35.7 g/dL   RDW 04.5 40.9 - 81.1 %   Neutrophils 48 Not Estab. %   Lymphs 39 Not Estab. %   Monocytes 10 Not Estab. %   Eos 2 Not Estab. %   Basos 1 Not Estab. %   Neutrophils Absolute 4.7 1.4 - 7.0 x10E3/uL   Lymphocytes Absolute 3.7 (H) 0.7 - 3.1 x10E3/uL   Monocytes Absolute 0.9 0.1 - 0.9 x10E3/uL   EOS (ABSOLUTE) 0.2 0.0 - 0.4 x10E3/uL   Basophils Absolute 0.1 0.0 - 0.2 x10E3/uL   Immature Granulocytes 0 Not Estab. %   Immature Grans (Abs) 0.0 0.0 - 0.1 x10E3/uL  TSH+T4F+T3Free     Status: Abnormal   Collection Time: 10/04/22 12:08 PM  Result Value Ref Range   TSH 0.187 (L) 0.450 - 4.500 uIU/mL   T3, Free 2.4 2.0 - 4.4 pg/mL   Free T4 2.03 (H) 0.82 - 1.77 ng/dL  Lipid Panel w/o Chol/HDL Ratio     Status: None   Collection Time: 10/04/22 12:08 PM  Result Value Ref Range   Cholesterol, Total 162 100 - 199 mg/dL   Triglycerides 92 0 - 149 mg/dL   HDL 82 >91 mg/dL   VLDL Cholesterol Cal 17 5 - 40 mg/dL   LDL Chol Calc (NIH) 63 0 - 99 mg/dL      Assessment & Plan:  Patient advised to stop her Aricept.  But she will continue all the rest of the medications. Problem List Items Addressed This Visit     Essential hypertension - Primary   GERD (gastroesophageal reflux disease)   Mixed hyperlipidemia   Hypothyroidism, unspecified   Memory loss   CHF (congestive heart failure), NYHA class III, acute on chronic,  diastolic (HCC)    Return in about 3 months (around 02/01/2023).   Total time spent: 25 minutes  Margaretann Loveless, MD  11/01/2022

## 2022-11-05 ENCOUNTER — Telehealth: Payer: Self-pay

## 2022-11-05 NOTE — Telephone Encounter (Signed)
Pt called and left vm regarding at appt she forgot to mention she has had allergies going on for the past few weeks, mainly just runny nose. No fever & doesn't feel bad, but She asked if there's something you can send in for her? Please advise

## 2022-11-06 ENCOUNTER — Other Ambulatory Visit: Payer: Self-pay | Admitting: Internal Medicine

## 2022-11-06 DIAGNOSIS — J301 Allergic rhinitis due to pollen: Secondary | ICD-10-CM

## 2022-11-06 MED ORDER — FLUTICASONE PROPIONATE 50 MCG/ACT NA SUSP: 1.0000 | Freq: Every day | NASAL | 2 refills | Status: DC

## 2022-11-06 MED ORDER — AZITHROMYCIN 250 MG PO TABS: ORAL_TABLET | ORAL | 0 refills | Status: AC

## 2022-11-20 ENCOUNTER — Ambulatory Visit: Payer: Medicare Other | Admitting: Cardiovascular Disease

## 2022-11-20 ENCOUNTER — Encounter: Payer: Self-pay | Admitting: Cardiovascular Disease

## 2022-11-20 VITALS — BP 124/72 | HR 68 | Ht 62.0 in | Wt 146.2 lb

## 2022-11-20 DIAGNOSIS — I5033 Acute on chronic diastolic (congestive) heart failure: Secondary | ICD-10-CM | POA: Diagnosis not present

## 2022-11-20 DIAGNOSIS — I10 Essential (primary) hypertension: Secondary | ICD-10-CM

## 2022-11-20 DIAGNOSIS — I482 Chronic atrial fibrillation, unspecified: Secondary | ICD-10-CM

## 2022-11-20 DIAGNOSIS — E782 Mixed hyperlipidemia: Secondary | ICD-10-CM | POA: Diagnosis not present

## 2022-11-20 NOTE — Progress Notes (Signed)
Cardiology Office Note   Date:  11/20/2022   ID:  Abigail Strong, Abigail Strong 1929-09-15, MRN [id number]  PCP:  Corky Downs, MD  Cardiologist:  Adrian Blackwater, MD      History of Present Illness: Abigail Strong is a 89 y.o. female who presents for  Chief Complaint  Patient presents with   Follow-up    2 month follow up    Patient in office for 2 month follow up. Patient doing well. Denies chest pain, shortness of breath. Patient has multiple, non-cardiac complaints- knee pain, shoulder pain, trouble sleeping, hot flashes after breakfast and at bedtime     Past Medical History:  Diagnosis Date   Depression    GERD (gastroesophageal reflux disease)    Hyperlipidemia    Hyperlipidemia, unspecified 01/01/2014   Hypothyroidism    Insomnia    Musculoskeletal chest pain 09/27/2016     Past Surgical History:  Procedure Laterality Date   ABDOMINAL HYSTERECTOMY     BREAST BIOPSY Bilateral 90's   benign   cataracts     COLON SURGERY     partial colectomy   intestinal blockage     NISSEN FUNDOPLICATION       Current Outpatient Medications  Medication Sig Dispense Refill   acetaminophen (TYLENOL) 500 MG tablet Take 1,000 mg by mouth every 6 (six) hours as needed for mild pain or moderate pain.     alendronate (FOSAMAX) 70 MG tablet Take 1 tablet by mouth once a week.     carvedilol (COREG) 3.125 MG tablet Take 1 tablet (3.125 mg total) by mouth 2 (two) times daily with a meal. 60 tablet 3   ELIQUIS 2.5 MG TABS tablet Take 1 tablet (2.5 mg total) by mouth 2 (two) times daily. 60 tablet 6   fenofibrate 54 MG tablet Take 1 tablet (54 mg total) by mouth daily. 30 tablet 3   fluticasone (FLONASE) 50 MCG/ACT nasal spray Place 1 spray into both nostrils daily. 16 g 2   hydrOXYzine (ATARAX) 25 MG tablet Take 1 tablet (25 mg total) by mouth every 6 (six) hours as needed. 30 tablet 4   levothyroxine (LEVOXYL) 112 MCG tablet Take 1 tablet (112 mcg total) by mouth daily before  breakfast. 30 tablet 2   pantoprazole (PROTONIX) 40 MG tablet Take 1 tablet (40 mg total) by mouth daily. 30 tablet 4   ranolazine (RANEXA) 500 MG 12 hr tablet Take 1 tablet (500 mg total) by mouth 2 (two) times daily. 90 tablet 1   rosuvastatin (CRESTOR) 20 MG tablet Take 1 tablet (20 mg total) by mouth daily. 90 tablet 1   sacubitril-valsartan (ENTRESTO) 24-26 MG Take 1 tablet by mouth 2 (two) times daily. 60 tablet 1   torsemide (DEMADEX) 10 MG tablet Take 1 tablet (10 mg total) by mouth daily. 90 tablet 1   No current facility-administered medications for this visit.    Allergies:   Gabapentin, Sulfa antibiotics, and Sulfonamide derivatives    Social History:   reports that she has never smoked. She has never used smokeless tobacco. She reports that she does not drink alcohol and does not use drugs.   Family History:  family history includes Breast cancer in her maternal aunt; Cervical cancer in her mother.    ROS:     Review of Systems  Constitutional: Negative.   HENT: Negative.    Eyes: Negative.   Respiratory: Negative.    Cardiovascular: Negative.   Gastrointestinal: Negative.   Genitourinary: Negative.  Musculoskeletal: Negative.   Skin: Negative.   Neurological: Negative.   Endo/Heme/Allergies: Negative.   Psychiatric/Behavioral: Negative.    All other systems reviewed and are negative.   All other systems are reviewed and negative.   PHYSICAL EXAM: VS:  BP 124/72   Pulse 68   Ht 5\' 2"  (1.575 m)   Wt 146 lb 3.2 oz (66.3 kg)   SpO2 95%   BMI 26.74 kg/m  , BMI Body mass index is 26.74 kg/m. Last weight:  Wt Readings from Last 3 Encounters:  11/20/22 146 lb 3.2 oz (66.3 kg)  11/01/22 145 lb 6.4 oz (66 kg)  10/04/22 144 lb 3.2 oz (65.4 kg)    Physical Exam Constitutional:      Appearance: Normal appearance.  Cardiovascular:     Rate and Rhythm: Normal rate and regular rhythm.     Heart sounds: Normal heart sounds.  Pulmonary:     Effort: Pulmonary  effort is normal.     Breath sounds: Normal breath sounds.  Musculoskeletal:     Right lower leg: No edema.     Left lower leg: No edema.  Neurological:     Mental Status: She is alert.     EKG: none today  Recent Labs: 10/04/2022: ALT 9; BUN 38; Creatinine, Ser 1.67; Hemoglobin 11.6; Potassium 5.2; Sodium 137; TSH 0.187    Lipid Panel    Component Value Date/Time   CHOL 162 10/04/2022 1208   TRIG 92 10/04/2022 1208   HDL 82 10/04/2022 1208   CHOLHDL 2.6 04/26/2020 1606   LDLCALC 63 10/04/2022 1208   LDLCALC 92 04/26/2020 1606    Other studies Reviewed: none today   ASSESSMENT AND PLAN:    ICD-10-CM   1. CHF (congestive heart failure), NYHA class III, acute on chronic, diastolic  I50.33     2. Chronic atrial fibrillation  I48.20     3. Essential hypertension  I10     4. Mixed hyperlipidemia  E78.2        Problem List Items Addressed This Visit       Cardiovascular and Mediastinum   Essential hypertension    Well controlled. Continue same medications. Follow up with PCP for non-cardiac complaints.       CHF (congestive heart failure), NYHA class III, acute on chronic, diastolic - Primary   Chronic atrial fibrillation     Other   Mixed hyperlipidemia     Disposition:   Return in about 2 months (around 01/20/2023).    Total time spent: 30 minutes  Signed,  Adrian Blackwater, MD  11/20/2022 1:31 PM    [facility]

## 2022-11-20 NOTE — Assessment & Plan Note (Signed)
Well controlled. Continue same medications. Follow up with PCP for non-cardiac complaints.

## 2022-11-26 ENCOUNTER — Telehealth: Payer: Self-pay

## 2022-11-26 NOTE — Telephone Encounter (Signed)
Patient called asking for call back about being dizzy

## 2023-01-02 ENCOUNTER — Other Ambulatory Visit: Payer: Self-pay | Admitting: Internal Medicine

## 2023-01-02 DIAGNOSIS — I2510 Atherosclerotic heart disease of native coronary artery without angina pectoris: Secondary | ICD-10-CM

## 2023-01-16 ENCOUNTER — Other Ambulatory Visit: Payer: Self-pay | Admitting: Internal Medicine

## 2023-01-16 DIAGNOSIS — I482 Chronic atrial fibrillation, unspecified: Secondary | ICD-10-CM

## 2023-01-21 ENCOUNTER — Ambulatory Visit: Payer: Medicare Other | Admitting: Cardiovascular Disease

## 2023-01-21 ENCOUNTER — Encounter: Payer: Self-pay | Admitting: Cardiovascular Disease

## 2023-01-21 VITALS — BP 110/62 | HR 75 | Ht 65.0 in | Wt 150.0 lb

## 2023-01-21 DIAGNOSIS — I482 Chronic atrial fibrillation, unspecified: Secondary | ICD-10-CM

## 2023-01-21 DIAGNOSIS — E782 Mixed hyperlipidemia: Secondary | ICD-10-CM | POA: Diagnosis not present

## 2023-01-21 DIAGNOSIS — I10 Essential (primary) hypertension: Secondary | ICD-10-CM

## 2023-01-21 DIAGNOSIS — I5033 Acute on chronic diastolic (congestive) heart failure: Secondary | ICD-10-CM

## 2023-01-21 NOTE — Assessment & Plan Note (Signed)
09/2022 LDL 63, continue fenofibrate and Crestor.

## 2023-01-21 NOTE — Assessment & Plan Note (Signed)
Well controlled. Continue same medications. Follow up with PCP for non-cardiac complaints.  

## 2023-01-21 NOTE — Assessment & Plan Note (Signed)
Patient denies chest pain, shortness of breath. Taking medications as prescribed and tolerating.

## 2023-01-21 NOTE — Progress Notes (Signed)
Cardiology Office Note   Date:  01/21/2023   ID:  Abigail, Strong [birth year], MRN [id number]  PCP:  Abigail Loveless, MD  Cardiologist:  Abigail Blackwater, MD      History of Present Illness: Abigail Strong is a 89 y.o. female who presents for  Chief Complaint  Patient presents with   Follow-up    2 Months Follow Up    Patient in office for 2 month follow up.  Denies chest pain. Complains of feeling fatigued.      Past Medical History:  Diagnosis Date   Depression    GERD (gastroesophageal reflux disease)    Hyperlipidemia    Hyperlipidemia, unspecified 01/01/2014   Hypothyroidism    Insomnia    Musculoskeletal chest pain 09/27/2016     Past Surgical History:  Procedure Laterality Date   ABDOMINAL HYSTERECTOMY     BREAST BIOPSY Bilateral 90's   benign   cataracts     COLON SURGERY     partial colectomy   intestinal blockage     NISSEN FUNDOPLICATION       Current Outpatient Medications  Medication Sig Dispense Refill   acetaminophen (TYLENOL) 500 MG tablet Take 1,000 mg by mouth every 6 (six) hours as needed for mild pain or moderate pain.     alendronate (FOSAMAX) 70 MG tablet Take 1 tablet by mouth once a week.     carvedilol (COREG) 3.125 MG tablet Take 1 tablet (3.125 mg total) by mouth 2 (two) times daily with a meal. 60 tablet 3   ELIQUIS 2.5 MG TABS tablet Take 1 tablet (2.5 mg total) by mouth 2 (two) times daily. 60 tablet 6   ENTRESTO 24-26 MG TAKE 1 TABLET BY MOUTH TWICE DAILY 60 tablet 1   fenofibrate 54 MG tablet Take 1 tablet (54 mg total) by mouth daily. 30 tablet 3   fluticasone (FLONASE) 50 MCG/ACT nasal spray Place 1 spray into both nostrils daily. 16 g 2   hydrOXYzine (ATARAX) 25 MG tablet Take 1 tablet (25 mg total) by mouth every 6 (six) hours as needed. 30 tablet 4   levothyroxine (LEVOXYL) 112 MCG tablet Take 1 tablet (112 mcg total) by mouth daily before breakfast. 30 tablet 2   pantoprazole (PROTONIX) 40 MG tablet Take 1 tablet (40  mg total) by mouth daily. 30 tablet 4   ranolazine (RANEXA) 500 MG 12 hr tablet TAKE 1 TABLET(500 MG) BY MOUTH TWICE DAILY 90 tablet 1   rosuvastatin (CRESTOR) 20 MG tablet Take 1 tablet (20 mg total) by mouth daily. 90 tablet 1   torsemide (DEMADEX) 10 MG tablet Take 1 tablet (10 mg total) by mouth daily. 90 tablet 1   No current facility-administered medications for this visit.    Allergies:   Gabapentin, Sulfa antibiotics, and Sulfonamide derivatives    Social History:   reports that she has never smoked. She has never used smokeless tobacco. She reports that she does not drink alcohol and does not use drugs.   Family History:  family history includes Breast cancer in her maternal aunt; Cervical cancer in her mother.    ROS:     Review of Systems  Constitutional: Negative.   HENT: Negative.    Eyes: Negative.   Respiratory: Negative.    Cardiovascular: Negative.   Gastrointestinal: Negative.   Genitourinary: Negative.   Musculoskeletal: Negative.   Skin: Negative.   Neurological: Negative.   Endo/Heme/Allergies: Negative.   Psychiatric/Behavioral: Negative.    All  other systems reviewed and are negative.    All other systems are reviewed and negative.    PHYSICAL EXAM: VS:  BP 110/62   Pulse 75   Ht 5\' 5"  (1.651 m)   Wt 150 lb (68 kg)   SpO2 94%   BMI 24.96 kg/m  , BMI Body mass index is 24.96 kg/m. Last weight:  Wt Readings from Last 3 Encounters:  01/21/23 150 lb (68 kg)  11/20/22 146 lb 3.2 oz (66.3 kg)  11/01/22 145 lb 6.4 oz (66 kg)     Physical Exam Constitutional:      Appearance: Normal appearance.  Cardiovascular:     Rate and Rhythm: Normal rate and regular rhythm.     Heart sounds: Normal heart sounds.  Pulmonary:     Effort: Pulmonary effort is normal.     Breath sounds: Normal breath sounds.  Musculoskeletal:     Right lower leg: No edema.     Left lower leg: No edema.  Neurological:     Mental Status: She is alert.     EKG: none  today  Recent Labs: 10/04/2022: ALT 9; BUN 38; Creatinine, Ser 1.67; Hemoglobin 11.6; Potassium 5.2; Sodium 137; TSH 0.187    Lipid Panel    Component Value Date/Time   CHOL 162 10/04/2022 1208   TRIG 92 10/04/2022 1208   HDL 82 10/04/2022 1208   CHOLHDL 2.6 04/26/2020 1606   LDLCALC 63 10/04/2022 1208   LDLCALC 92 04/26/2020 1606     ASSESSMENT AND PLAN:    ICD-10-CM   1. CHF (congestive heart failure), NYHA class III, acute on chronic, diastolic (HCC)  I50.33     2. Chronic atrial fibrillation (HCC)  I48.20     3. Essential hypertension  I10     4. Mixed hyperlipidemia  E78.2        Problem List Items Addressed This Visit       Cardiovascular and Mediastinum   Essential hypertension    Well controlled. Continue same medications. Follow up with PCP for non-cardiac complaints.        CHF (congestive heart failure), NYHA class III, acute on chronic, diastolic (HCC) - Primary    Patient denies chest pain, shortness of breath. Taking medications as prescribed and tolerating.       Chronic atrial fibrillation (HCC)    HR controlled on carvedilol.         Other   Mixed hyperlipidemia    09/2022 LDL 63, continue fenofibrate and Crestor.         Disposition:   Return in about 3 months (around 04/23/2023).    Total time spent: 30 minutes  Signed,  Abigail Blackwater, MD  01/21/2023 1:41 PM    [facility]

## 2023-01-21 NOTE — Assessment & Plan Note (Signed)
HR controlled on carvedilol.

## 2023-01-31 ENCOUNTER — Other Ambulatory Visit: Payer: Self-pay | Admitting: Internal Medicine

## 2023-02-01 ENCOUNTER — Encounter: Payer: Self-pay | Admitting: Internal Medicine

## 2023-02-01 ENCOUNTER — Ambulatory Visit (INDEPENDENT_AMBULATORY_CARE_PROVIDER_SITE_OTHER): Payer: Medicare Other | Admitting: Internal Medicine

## 2023-02-01 VITALS — BP 104/60 | HR 60 | Ht 65.0 in | Wt 150.0 lb

## 2023-02-01 DIAGNOSIS — F419 Anxiety disorder, unspecified: Secondary | ICD-10-CM | POA: Diagnosis not present

## 2023-02-01 DIAGNOSIS — E038 Other specified hypothyroidism: Secondary | ICD-10-CM

## 2023-02-01 DIAGNOSIS — I5033 Acute on chronic diastolic (congestive) heart failure: Secondary | ICD-10-CM | POA: Diagnosis not present

## 2023-02-01 DIAGNOSIS — F32A Depression, unspecified: Secondary | ICD-10-CM

## 2023-02-01 DIAGNOSIS — I482 Chronic atrial fibrillation, unspecified: Secondary | ICD-10-CM

## 2023-02-01 DIAGNOSIS — R7303 Prediabetes: Secondary | ICD-10-CM

## 2023-02-01 NOTE — Progress Notes (Signed)
Established Patient Office Visit  Subjective:  Patient ID: Abigail Strong, female    DOB: Jan 13, 1930  Age: 89 y.o. MRN: [id number]  Chief Complaint  Patient presents with   Follow-up    3 month follow up    Patient comes in for follow-up accompanied by her granddaughter.  She forgot to put on her hearing aid so is having difficulty hearing [place].  She is in a wheelchair today with bilateral leg swelling.  Patient reports that her left knee is bothering her a lot and she recently received an injection which did not help.  Now her knee brace was recommended but she is getting right now. Reports of feeling tired.  Her TSH needs to be repeated today.    No other concerns at this time.   Past Medical History:  Diagnosis Date   Depression    GERD (gastroesophageal reflux disease)    Hyperlipidemia    Hyperlipidemia, unspecified 01/01/2014   Hypothyroidism    Insomnia    Musculoskeletal chest pain 09/27/2016    Past Surgical History:  Procedure Laterality Date   ABDOMINAL HYSTERECTOMY     BREAST BIOPSY Bilateral 90's   benign   cataracts     COLON SURGERY     partial colectomy   intestinal blockage     NISSEN FUNDOPLICATION      Social History   Socioeconomic History   Marital status: Married    Spouse name: Not on file   Number of children: Not on file   Years of education: Not on file   Highest education level: Not on file  Occupational History   Not on file  Tobacco Use   Smoking status: Never   Smokeless tobacco: Never  Substance and Sexual Activity   Alcohol use: No   Drug use: No   Sexual activity: Not Currently    Birth control/protection: Post-menopausal  Other Topics Concern   Not on file  Social History Narrative   Not on file   Social Determinants of Health   Financial Resource Strain: High Risk (05/04/2021)   Overall Financial Resource Strain (CARDIA)    Difficulty of Paying Living Expenses: Very hard  Food Insecurity: No Food Insecurity  (05/04/2021)   Hunger Vital Sign    Worried About Running Out of Food in the Last Year: Never true    Ran Out of Food in the Last Year: Never true  Transportation Needs: No Transportation Needs (05/04/2021)   PRAPARE - Administrator, Civil Service (Medical): No    Lack of Transportation (Non-Medical): No  Physical Activity: Inactive (05/04/2021)   Exercise Vital Sign    Days of Exercise per Week: 0 days    Minutes of Exercise per Session: 0 min  Stress: No Stress Concern Present (05/04/2021)   [employer] of Occupational Health - Occupational Stress Questionnaire    Feeling of Stress : Only a little  Social Connections: Moderately Integrated (05/04/2021)   Social Connection and Isolation Panel [NHANES]    Frequency of Communication with Friends and Family: More than three times a week    Frequency of Social Gatherings with Friends and Family: More than three times a week    Attends Religious Services: 1 to 4 times per year    Active Member of [employer] or Organizations: Yes    Attends Banker Meetings: 1 to 4 times per year    Marital Status: Widowed  Intimate Partner Violence: Not At Risk (05/04/2021)   Humiliation,  Afraid, Rape, and Kick questionnaire    Fear of Current or Ex-Partner: No    Emotionally Abused: No    Physically Abused: No    Sexually Abused: No    Family History  Problem Relation Age of Onset   Cervical cancer Mother    Breast cancer Maternal Aunt     Allergies  Allergen Reactions   Gabapentin Other (See Comments)    Dizziness    Sulfa Antibiotics Other (See Comments)   Sulfonamide Derivatives     Review of Systems  Constitutional:  Positive for malaise/fatigue. Negative for chills, diaphoresis and fever.  HENT:  Positive for hearing loss. Negative for congestion, sore throat and tinnitus.   Eyes: Negative.   Respiratory:  Negative for cough, shortness of breath and wheezing.   Cardiovascular:  Negative for chest pain,  palpitations, leg swelling and PND.  Gastrointestinal:  Negative for abdominal pain, blood in stool, heartburn, melena and nausea.  Genitourinary:  Negative for dysuria, flank pain and hematuria.  Musculoskeletal:  Positive for joint pain and myalgias.  Neurological:  Positive for dizziness. Negative for tremors, seizures, loss of consciousness, weakness and headaches.  Psychiatric/Behavioral:  Negative for depression. The patient is not nervous/anxious and does not have insomnia.        Objective:   BP 104/60   Pulse 60   Ht 5\' 5"  (1.651 m)   Wt 150 lb (68 kg)   SpO2 94%   BMI 24.96 kg/m   Vitals:   02/01/23 1302  BP: 104/60  Pulse: 60  Height: 5\' 5"  (1.651 m)  Weight: 150 lb (68 kg)  SpO2: 94%  BMI (Calculated): 24.96    Physical Exam Vitals and nursing note reviewed.  Constitutional:      Appearance: Normal appearance.  HENT:     Head: Normocephalic and atraumatic.     Nose: Nose normal.  Cardiovascular:     Rate and Rhythm: Normal rate and regular rhythm.     Pulses: Normal pulses.     Heart sounds: Murmur heard.  Pulmonary:     Effort: Pulmonary effort is normal.     Breath sounds: No wheezing, rhonchi or rales.  Chest:     Chest wall: No tenderness.  Abdominal:     General: Bowel sounds are normal.     Palpations: Abdomen is soft.     Tenderness: There is no right CVA tenderness or left CVA tenderness.  Musculoskeletal:     Cervical back: Normal range of motion and neck supple.     Right lower leg: Edema present.     Left lower leg: Edema present.  Lymphadenopathy:     Cervical: No cervical adenopathy.  Neurological:     General: No focal deficit present.     Mental Status: She is alert and oriented to person, place, and time.  Psychiatric:        Behavior: Behavior normal.        Thought Content: Thought content normal.      No results found for any visits on 02/01/23.  No results found for this or any previous visit (from the past 2160  hour(s)).    Assessment & Plan:  Continue all medications.  TSH and adjust her thyroid medicine as needed. Problem List Items Addressed This Visit     Hypothyroidism, unspecified - Primary   Relevant Medications   levothyroxine (SYNTHROID) 125 MCG tablet   Other Relevant Orders   TSH+T4F+T3Free   CHF (congestive heart failure), NYHA class III,  acute on chronic, diastolic (HCC)   Relevant Orders   CMP14+EGFR   Chronic atrial fibrillation (HCC)   Relevant Orders   CBC With Differential    Return in about 3 months (around 05/04/2023).   Total time spent: 30 minutes  Margaretann Loveless, MD  02/01/2023   This document may have been prepared by [facility] Voice Recognition software and as such may include unintentional dictation errors.

## 2023-02-02 LAB — CMP14+EGFR
ALT: 7 IU/L (ref 0–32)
AST: 15 IU/L (ref 0–40)
Albumin: 3.8 g/dL (ref 3.6–4.6)
Alkaline Phosphatase: 41 IU/L — ABNORMAL LOW (ref 44–121)
BUN/Creatinine Ratio: 18 (ref 12–28)
BUN: 34 mg/dL (ref 10–36)
Bilirubin Total: 0.4 mg/dL (ref 0.0–1.2)
CO2: 22 mmol/L (ref 20–29)
Calcium: 8.8 mg/dL (ref 8.7–10.3)
Chloride: 100 mmol/L (ref 96–106)
Creatinine, Ser: 1.87 mg/dL — ABNORMAL HIGH (ref 0.57–1.00)
Globulin, Total: 1.7 g/dL (ref 1.5–4.5)
Glucose: 90 mg/dL (ref 70–99)
Potassium: 5.5 mmol/L — ABNORMAL HIGH (ref 3.5–5.2)
Sodium: 135 mmol/L (ref 134–144)
Total Protein: 5.5 g/dL — ABNORMAL LOW (ref 6.0–8.5)
eGFR: 25 mL/min/{1.73_m2} — ABNORMAL LOW (ref >59)

## 2023-02-02 LAB — CBC WITH DIFFERENTIAL
Basophils Absolute: 0.1 10*3/uL (ref 0.0–0.2)
Basos: 1 %
EOS (ABSOLUTE): 0.2 10*3/uL (ref 0.0–0.4)
Eos: 2 %
Hematocrit: 31.2 % — ABNORMAL LOW (ref 34.0–46.6)
Hemoglobin: 10.5 g/dL — ABNORMAL LOW (ref 11.1–15.9)
Immature Grans (Abs): 0 10*3/uL (ref 0.0–0.1)
Immature Granulocytes: 0 %
Lymphocytes Absolute: 3 10*3/uL (ref 0.7–3.1)
Lymphs: 41 %
MCH: 30.6 pg (ref 26.6–33.0)
MCHC: 33.7 g/dL (ref 31.5–35.7)
MCV: 91 fL (ref 79–97)
Monocytes Absolute: 0.8 10*3/uL (ref 0.1–0.9)
Monocytes: 10 %
Neutrophils Absolute: 3.4 10*3/uL (ref 1.4–7.0)
Neutrophils: 46 %
RBC: 3.43 x10E6/uL — ABNORMAL LOW (ref 3.77–5.28)
RDW: 12.8 % (ref 11.7–15.4)
WBC: 7.4 10*3/uL (ref 3.4–10.8)

## 2023-02-02 LAB — TSH+T4F+T3FREE
Free T4: 1.78 ng/dL — ABNORMAL HIGH (ref 0.82–1.77)
T3, Free: 1.8 pg/mL — ABNORMAL LOW (ref 2.0–4.4)
TSH: 0.715 u[IU]/mL (ref 0.450–4.500)

## 2023-02-06 NOTE — Progress Notes (Signed)
Patient notified

## 2023-02-07 ENCOUNTER — Other Ambulatory Visit: Payer: Medicare Other

## 2023-02-07 ENCOUNTER — Other Ambulatory Visit: Payer: Self-pay | Admitting: Internal Medicine

## 2023-02-07 DIAGNOSIS — I5033 Acute on chronic diastolic (congestive) heart failure: Secondary | ICD-10-CM

## 2023-02-08 LAB — CMP14+EGFR
ALT: 5 IU/L (ref 0–32)
AST: 17 IU/L (ref 0–40)
Albumin: 4.1 g/dL (ref 3.6–4.6)
Alkaline Phosphatase: 42 IU/L — ABNORMAL LOW (ref 44–121)
BUN/Creatinine Ratio: 20 (ref 12–28)
BUN: 29 mg/dL (ref 10–36)
Bilirubin Total: 0.5 mg/dL (ref 0.0–1.2)
CO2: 24 mmol/L (ref 20–29)
Calcium: 9.6 mg/dL (ref 8.7–10.3)
Chloride: 105 mmol/L (ref 96–106)
Creatinine, Ser: 1.48 mg/dL — ABNORMAL HIGH (ref 0.57–1.00)
Globulin, Total: 1.9 g/dL (ref 1.5–4.5)
Glucose: 87 mg/dL (ref 70–99)
Potassium: 5.8 mmol/L — ABNORMAL HIGH (ref 3.5–5.2)
Sodium: 139 mmol/L (ref 134–144)
Total Protein: 6 g/dL (ref 6.0–8.5)
eGFR: 33 mL/min/{1.73_m2} — ABNORMAL LOW (ref >59)

## 2023-02-08 NOTE — Progress Notes (Signed)
Patient notified

## 2023-02-14 ENCOUNTER — Other Ambulatory Visit: Payer: Self-pay | Admitting: Internal Medicine

## 2023-02-14 ENCOUNTER — Other Ambulatory Visit: Payer: Medicare Other

## 2023-02-14 DIAGNOSIS — E875 Hyperkalemia: Secondary | ICD-10-CM

## 2023-02-15 LAB — POTASSIUM: Potassium: 5.6 mmol/L — ABNORMAL HIGH (ref 3.5–5.2)

## 2023-02-15 NOTE — Progress Notes (Signed)
Spoke with patient who verbalized understanding.

## 2023-02-27 ENCOUNTER — Telehealth: Payer: Self-pay | Admitting: Internal Medicine

## 2023-02-27 NOTE — Telephone Encounter (Signed)
Jasmine December called in needing help getting patient assistance for [facility]. [employer] [employer] we can give her a bottle of samples and a copay card. Will come by and pick up.

## 2023-03-08 ENCOUNTER — Encounter: Payer: Self-pay | Admitting: Internal Medicine

## 2023-03-08 ENCOUNTER — Ambulatory Visit (INDEPENDENT_AMBULATORY_CARE_PROVIDER_SITE_OTHER): Payer: Medicare Other | Admitting: Internal Medicine

## 2023-03-08 VITALS — BP 115/78 | HR 75 | Ht 65.0 in | Wt 155.0 lb

## 2023-03-08 DIAGNOSIS — I10 Essential (primary) hypertension: Secondary | ICD-10-CM | POA: Diagnosis not present

## 2023-03-08 DIAGNOSIS — H8113 Benign paroxysmal vertigo, bilateral: Secondary | ICD-10-CM | POA: Diagnosis not present

## 2023-03-08 DIAGNOSIS — R110 Nausea: Secondary | ICD-10-CM

## 2023-03-08 DIAGNOSIS — E782 Mixed hyperlipidemia: Secondary | ICD-10-CM

## 2023-03-08 DIAGNOSIS — J301 Allergic rhinitis due to pollen: Secondary | ICD-10-CM | POA: Diagnosis not present

## 2023-03-08 MED ORDER — FLUTICASONE PROPIONATE 50 MCG/ACT NA SUSP: 1.0000 | Freq: Every day | NASAL | 2 refills | Status: DC

## 2023-03-08 MED ORDER — MECLIZINE HCL 12.5 MG PO TABS: 12.5000 mg | ORAL_TABLET | Freq: Two times a day (BID) | ORAL | 0 refills | Status: DC | PRN

## 2023-03-08 NOTE — Progress Notes (Signed)
Established Patient Office Visit  Subjective:  Patient ID: Abigail Strong, female    DOB: May 20, 1930  Age: 89 y.o. MRN: [id number]  Chief Complaint  Patient presents with   Acute Visit    dizziness    Patient comes in with a friend. Report of dizziness when moves her head. Also has more difficulty hearing these days. C/o occasional nausea.  Patient has history of allergies, and dizziness . Previously her Carotid dopplers and CT head have been unremarkable. O/E she has fluid behind both TM.  Will add Flonase,Zofran, and Meclizine- small dose.    No other concerns at this time.   Past Medical History:  Diagnosis Date   Depression    GERD (gastroesophageal reflux disease)    Hyperlipidemia    Hyperlipidemia, unspecified 01/01/2014   Hypothyroidism    Insomnia    Musculoskeletal chest pain 09/27/2016    Past Surgical History:  Procedure Laterality Date   ABDOMINAL HYSTERECTOMY     BREAST BIOPSY Bilateral 90's   benign   cataracts     COLON SURGERY     partial colectomy   intestinal blockage     NISSEN FUNDOPLICATION      Social History   Socioeconomic History   Marital status: Married    Spouse name: Not on file   Number of children: Not on file   Years of education: Not on file   Highest education level: Not on file  Occupational History   Not on file  Tobacco Use   Smoking status: Never   Smokeless tobacco: Never  Substance and Sexual Activity   Alcohol use: No   Drug use: No   Sexual activity: Not Currently    Birth control/protection: Post-menopausal  Other Topics Concern   Not on file  Social History Narrative   Not on file   Social Determinants of Health   Financial Resource Strain: High Risk (05/04/2021)   Overall Financial Resource Strain (CARDIA)    Difficulty of Paying Living Expenses: Very hard  Food Insecurity: No Food Insecurity (05/04/2021)   Hunger Vital Sign    Worried About Running Out of Food in the Last Year: Never true    Ran  Out of Food in the Last Year: Never true  Transportation Needs: No Transportation Needs (05/04/2021)   PRAPARE - Administrator, Civil Service (Medical): No    Lack of Transportation (Non-Medical): No  Physical Activity: Inactive (05/04/2021)   Exercise Vital Sign    Days of Exercise per Week: 0 days    Minutes of Exercise per Session: 0 min  Stress: No Stress Concern Present (05/04/2021)   [employer] of Occupational Health - Occupational Stress Questionnaire    Feeling of Stress : Only a little  Social Connections: Moderately Integrated (05/04/2021)   Social Connection and Isolation Panel [NHANES]    Frequency of Communication with Friends and Family: More than three times a week    Frequency of Social Gatherings with Friends and Family: More than three times a week    Attends Religious Services: 1 to 4 times per year    Active Member of [employer] or Organizations: Yes    Attends Banker Meetings: 1 to 4 times per year    Marital Status: Widowed  Intimate Partner Violence: Not At Risk (05/04/2021)   Humiliation, Afraid, Rape, and Kick questionnaire    Fear of Current or Ex-Partner: No    Emotionally Abused: No    Physically Abused: No  Sexually Abused: No    Family History  Problem Relation Age of Onset   Cervical cancer Mother    Breast cancer Maternal Aunt     Allergies  Allergen Reactions   Gabapentin Other (See Comments)    Dizziness    Sulfa Antibiotics Other (See Comments)   Sulfonamide Derivatives     Review of Systems  Constitutional: Negative.  Negative for chills, fever and malaise/fatigue.  HENT:  Positive for congestion and hearing loss. Negative for ear pain and sinus pain.   Eyes:  Positive for blurred vision.  Respiratory: Negative.  Negative for cough, shortness of breath and stridor.   Cardiovascular: Negative.  Negative for chest pain, palpitations and leg swelling.  Gastrointestinal: Negative.  Negative for abdominal pain,  constipation, diarrhea, heartburn, nausea and vomiting.  Genitourinary: Negative.  Negative for dysuria and flank pain.  Musculoskeletal: Negative.  Negative for joint pain and myalgias.  Skin: Negative.   Neurological:  Positive for dizziness. Negative for headaches.  Endo/Heme/Allergies: Negative.   Psychiatric/Behavioral: Negative.  Negative for depression and suicidal ideas. The patient is not nervous/anxious.        Objective:   BP 115/78   Pulse 75   Ht 5\' 5"  (1.651 m)   Wt 155 lb (70.3 kg)   SpO2 96%   BMI 25.79 kg/m   Vitals:   03/08/23 1324  BP: 115/78  Pulse: 75  Height: 5\' 5"  (1.651 m)  Weight: 155 lb (70.3 kg)  SpO2: 96%  BMI (Calculated): 25.79    Physical Exam Vitals and nursing note reviewed.  Constitutional:      Appearance: Normal appearance.  HENT:     Head: Normocephalic and atraumatic.     Right Ear: A middle ear effusion is present. There is no impacted cerumen.     Left Ear: A middle ear effusion is present. There is no impacted cerumen.     Nose: Nose normal.     Mouth/Throat:     Mouth: Mucous membranes are moist.     Pharynx: Oropharynx is clear.  Eyes:     Conjunctiva/sclera: Conjunctivae normal.     Pupils: Pupils are equal, round, and reactive to light.  Cardiovascular:     Rate and Rhythm: Normal rate and regular rhythm.     Pulses: Normal pulses.     Heart sounds: Normal heart sounds. No murmur heard. Pulmonary:     Effort: Pulmonary effort is normal.     Breath sounds: Normal breath sounds. No wheezing.  Abdominal:     General: Bowel sounds are normal.     Palpations: Abdomen is soft.     Tenderness: There is no abdominal tenderness. There is no right CVA tenderness or left CVA tenderness.  Musculoskeletal:        General: Normal range of motion.     Cervical back: Normal range of motion.     Right lower leg: No edema.     Left lower leg: No edema.  Skin:    General: Skin is warm and dry.  Neurological:     General: No  focal deficit present.     Mental Status: She is alert and oriented to person, place, and time.  Psychiatric:        Mood and Affect: Mood normal.        Behavior: Behavior normal.      No results found for any visits on 03/08/23.  Recent Results (from the past 2160 hour(s))  TSH+T4F+T3Free  Status: Abnormal   Collection Time: 02/01/23  1:40 PM  Result Value Ref Range   TSH 0.715 0.450 - 4.500 uIU/mL   T3, Free 1.8 (L) 2.0 - 4.4 pg/mL   Free T4 1.78 (H) 0.82 - 1.77 ng/dL  CBC With Differential     Status: Abnormal   Collection Time: 02/01/23  1:40 PM  Result Value Ref Range   WBC 7.4 3.4 - 10.8 x10E3/uL   RBC 3.43 (L) 3.77 - 5.28 x10E6/uL   Hemoglobin 10.5 (L) 11.1 - 15.9 g/dL   Hematocrit 40.9 (L) 81.1 - 46.6 %   MCV 91 79 - 97 fL   MCH 30.6 26.6 - 33.0 pg   MCHC 33.7 31.5 - 35.7 g/dL   RDW 91.4 78.2 - 95.6 %   Neutrophils 46 Not Estab. %   Lymphs 41 Not Estab. %   Monocytes 10 Not Estab. %   Eos 2 Not Estab. %   Basos 1 Not Estab. %   Neutrophils Absolute 3.4 1.4 - 7.0 x10E3/uL   Lymphocytes Absolute 3.0 0.7 - 3.1 x10E3/uL   Monocytes Absolute 0.8 0.1 - 0.9 x10E3/uL   EOS (ABSOLUTE) 0.2 0.0 - 0.4 x10E3/uL   Basophils Absolute 0.1 0.0 - 0.2 x10E3/uL   Immature Granulocytes 0 Not Estab. %   Immature Grans (Abs) 0.0 0.0 - 0.1 x10E3/uL    Comment: **Effective March 18, 2023, profile [id number] CBC/Differential**   (No Platelet) will be made non-orderable. [facility] Offers:   [id number] CBC With Differential/Platelet   CMP14+EGFR     Status: Abnormal   Collection Time: 02/01/23  1:40 PM  Result Value Ref Range   Glucose 90 70 - 99 mg/dL   BUN 34 10 - 36 mg/dL   Creatinine, Ser 5.78 (H) 0.57 - 1.00 mg/dL   eGFR 25 (L) >46 NG/EXB/2.84   BUN/Creatinine Ratio 18 12 - 28   Sodium 135 134 - 144 mmol/L   Potassium 5.5 (H) 3.5 - 5.2 mmol/L   Chloride 100 96 - 106 mmol/L   CO2 22 20 - 29 mmol/L   Calcium 8.8 8.7 - 10.3 mg/dL   Total Protein 5.5 (L) 6.0 - 8.5 g/dL   Albumin 3.8  3.6 - 4.6 g/dL   Globulin, Total 1.7 1.5 - 4.5 g/dL   Bilirubin Total 0.4 0.0 - 1.2 mg/dL   Alkaline Phosphatase 41 (L) 44 - 121 IU/L   AST 15 0 - 40 IU/L   ALT 7 0 - 32 IU/L  CMP14+EGFR     Status: Abnormal   Collection Time: 02/07/23 12:19 PM  Result Value Ref Range   Glucose 87 70 - 99 mg/dL   BUN 29 10 - 36 mg/dL   Creatinine, Ser 1.32 (H) 0.57 - 1.00 mg/dL   eGFR 33 (L) >44 WN/UUV/2.53   BUN/Creatinine Ratio 20 12 - 28   Sodium 139 134 - 144 mmol/L   Potassium 5.8 (H) 3.5 - 5.2 mmol/L   Chloride 105 96 - 106 mmol/L   CO2 24 20 - 29 mmol/L   Calcium 9.6 8.7 - 10.3 mg/dL   Total Protein 6.0 6.0 - 8.5 g/dL   Albumin 4.1 3.6 - 4.6 g/dL   Globulin, Total 1.9 1.5 - 4.5 g/dL   Bilirubin Total 0.5 0.0 - 1.2 mg/dL   Alkaline Phosphatase 42 (L) 44 - 121 IU/L   AST 17 0 - 40 IU/L   ALT 5 0 - 32 IU/L  Potassium     Status: Abnormal   Collection Time:  02/14/23 12:05 PM  Result Value Ref Range   Potassium 5.6 (H) 3.5 - 5.2 mmol/L      Assessment & Plan:  Start following meds. And monitor for improvement. Call if not better. Problem List Items Addressed This Visit     Essential hypertension   Mixed hyperlipidemia   Seasonal allergic rhinitis due to pollen   Relevant Medications   fluticasone (FLONASE) 50 MCG/ACT nasal spray   Benign positional vertigo, bilateral - Primary   Relevant Medications   meclizine (ANTIVERT) 12.5 MG tablet   Nausea    No follow-ups on file.   Total time spent: 30 minutes  Margaretann Loveless, MD  03/08/2023   This document may have been prepared by [facility] Voice Recognition software and as such may include unintentional dictation errors.

## 2023-03-15 ENCOUNTER — Telehealth: Payer: Self-pay

## 2023-03-20 ENCOUNTER — Ambulatory Visit (INDEPENDENT_AMBULATORY_CARE_PROVIDER_SITE_OTHER): Payer: Medicare Other | Admitting: Family

## 2023-03-20 ENCOUNTER — Encounter: Payer: Self-pay | Admitting: Family

## 2023-03-20 VITALS — BP 84/50 | HR 73 | Ht 65.0 in | Wt 150.0 lb

## 2023-03-20 DIAGNOSIS — R232 Flushing: Secondary | ICD-10-CM

## 2023-03-20 DIAGNOSIS — E861 Hypovolemia: Secondary | ICD-10-CM | POA: Diagnosis not present

## 2023-03-20 DIAGNOSIS — R42 Dizziness and giddiness: Secondary | ICD-10-CM | POA: Diagnosis not present

## 2023-03-20 LAB — POCT URINALYSIS DIPSTICK
Bilirubin, UA: NEGATIVE
Blood, UA: NEGATIVE
Glucose, UA: NEGATIVE
Ketones, UA: NEGATIVE
Leukocytes, UA: NEGATIVE
Nitrite, UA: POSITIVE
Protein, UA: NEGATIVE
Spec Grav, UA: 1.02 (ref 1.010–1.025)
Urobilinogen, UA: 0.2 E.U./dL
pH, UA: 6 (ref 5.0–8.0)

## 2023-03-20 LAB — POCT CBG (FASTING - GLUCOSE)-MANUAL ENTRY: Glucose Fasting, POC: 98 mg/dL (ref 70–99)

## 2023-03-20 MED ORDER — FUSION PLUS PO CAPS: 1.0000 | ORAL_CAPSULE | Freq: Every day | ORAL | 5 refills | Status: DC

## 2023-03-22 ENCOUNTER — Telehealth: Payer: Self-pay | Admitting: Internal Medicine

## 2023-03-22 NOTE — Telephone Encounter (Signed)
Patient left VM wanting to discuss her lab results. Please advise.

## 2023-03-27 ENCOUNTER — Telehealth: Payer: Self-pay

## 2023-03-27 NOTE — Telephone Encounter (Signed)
MD notified  

## 2023-03-27 NOTE — Telephone Encounter (Signed)
Pt is stating that she not feeling any better and that her BP was 80/52 at this time. She is having trouble walking across the room and stated that her daughter was suggesting for her to go to the ED. Pt was informed if she feels that she need to go and based off her BP that it would not hurt to go to the ED to  be seen. Pt verbalized understanding and the hung up.

## 2023-04-01 NOTE — Telephone Encounter (Signed)
Has appt. tomorrow

## 2023-04-02 ENCOUNTER — Encounter: Payer: Self-pay | Admitting: Internal Medicine

## 2023-04-02 ENCOUNTER — Ambulatory Visit (INDEPENDENT_AMBULATORY_CARE_PROVIDER_SITE_OTHER): Payer: Medicare Other | Admitting: Internal Medicine

## 2023-04-02 VITALS — BP 130/84 | HR 90 | Ht 65.0 in | Wt 146.0 lb

## 2023-04-02 DIAGNOSIS — I10 Essential (primary) hypertension: Secondary | ICD-10-CM | POA: Diagnosis not present

## 2023-04-02 DIAGNOSIS — R7303 Prediabetes: Secondary | ICD-10-CM

## 2023-04-02 DIAGNOSIS — H8113 Benign paroxysmal vertigo, bilateral: Secondary | ICD-10-CM

## 2023-04-02 DIAGNOSIS — I872 Venous insufficiency (chronic) (peripheral): Secondary | ICD-10-CM

## 2023-04-02 DIAGNOSIS — E782 Mixed hyperlipidemia: Secondary | ICD-10-CM | POA: Diagnosis not present

## 2023-04-02 DIAGNOSIS — R600 Localized edema: Secondary | ICD-10-CM

## 2023-04-02 DIAGNOSIS — N390 Urinary tract infection, site not specified: Secondary | ICD-10-CM

## 2023-04-02 NOTE — Progress Notes (Signed)
Established Patient Office Visit  Subjective:  Patient ID: Abigail Strong, female    DOB: Nov 19, 1929  Age: 89 y.o. MRN: [id number]  Chief Complaint  Patient presents with   Follow-up    2 week follow up    Patient comes in for her hospital follow-up accompanied by her family members.  She was recently seen in the ED for complaints of weakness, dizziness, difficulty breathing. Her workup was unremarkable but she was found to have a urinary tract infection.  She was given a dose of IV Rocephin and sent home on p.o. Keflex. Today she is looking better but mentions that she still gets some lightheadedness and dizziness when she gets up in the morning.  Patient has history of benign positional vertigo.  She does not complain of any nausea or vomiting no chest pain and no further shortness of breath.    No other concerns at this time.   Past Medical History:  Diagnosis Date   Depression    GERD (gastroesophageal reflux disease)    Hyperlipidemia    Hyperlipidemia, unspecified 01/01/2014   Hypothyroidism    Insomnia    Musculoskeletal chest pain 09/27/2016    Past Surgical History:  Procedure Laterality Date   ABDOMINAL HYSTERECTOMY     BREAST BIOPSY Bilateral 90's   benign   cataracts     COLON SURGERY     partial colectomy   intestinal blockage     NISSEN FUNDOPLICATION      Social History   Socioeconomic History   Marital status: Married    Spouse name: Not on file   Number of children: Not on file   Years of education: Not on file   Highest education level: Not on file  Occupational History   Not on file  Tobacco Use   Smoking status: Never   Smokeless tobacco: Never  Substance and Sexual Activity   Alcohol use: No   Drug use: No   Sexual activity: Not Currently    Birth control/protection: Post-menopausal  Other Topics Concern   Not on file  Social History Narrative   Not on file   Social Determinants of Health   Financial Resource Strain: High Risk  (05/04/2021)   Overall Financial Resource Strain (CARDIA)    Difficulty of Paying Living Expenses: Very hard  Food Insecurity: No Food Insecurity (05/04/2021)   Hunger Vital Sign    Worried About Running Out of Food in the Last Year: Never true    Ran Out of Food in the Last Year: Never true  Transportation Needs: No Transportation Needs (05/04/2021)   PRAPARE - Administrator, Civil Service (Medical): No    Lack of Transportation (Non-Medical): No  Physical Activity: Inactive (05/04/2021)   Exercise Vital Sign    Days of Exercise per Week: 0 days    Minutes of Exercise per Session: 0 min  Stress: No Stress Concern Present (05/04/2021)   [employer] of Occupational Health - Occupational Stress Questionnaire    Feeling of Stress : Only a little  Social Connections: Moderately Integrated (05/04/2021)   Social Connection and Isolation Panel [NHANES]    Frequency of Communication with Friends and Family: More than three times a week    Frequency of Social Gatherings with Friends and Family: More than three times a week    Attends Religious Services: 1 to 4 times per year    Active Member of [employer] or Organizations: Yes    Attends Banker Meetings:  1 to 4 times per year    Marital Status: Widowed  Intimate Partner Violence: Not At Risk (05/04/2021)   Humiliation, Afraid, Rape, and Kick questionnaire    Fear of Current or Ex-Partner: No    Emotionally Abused: No    Physically Abused: No    Sexually Abused: No    Family History  Problem Relation Age of Onset   Cervical cancer Mother    Breast cancer Maternal Aunt     Allergies  Allergen Reactions   Gabapentin Other (See Comments)    Dizziness    Sulfa Antibiotics Other (See Comments)   Sulfonamide Derivatives     Review of Systems  Constitutional: Negative.  Negative for chills, fever, malaise/fatigue and weight loss.  HENT:  Positive for hearing loss. Negative for congestion, ear pain and sore throat.    Eyes: Negative.   Respiratory: Negative.  Negative for cough, shortness of breath and stridor.   Cardiovascular: Negative.  Negative for chest pain, palpitations and leg swelling.  Gastrointestinal: Negative.  Negative for abdominal pain, constipation, diarrhea, heartburn, nausea and vomiting.  Genitourinary: Negative.  Negative for dysuria and flank pain.  Musculoskeletal:  Positive for joint pain. Negative for myalgias.  Skin: Negative.   Neurological:  Positive for dizziness. Negative for headaches.  Endo/Heme/Allergies: Negative.   Psychiatric/Behavioral: Negative.  Negative for depression and suicidal ideas. The patient is not nervous/anxious.        Objective:   BP 130/84   Pulse 90   Ht 5\' 5"  (1.651 m)   Wt 146 lb (66.2 kg)   SpO2 98%   BMI 24.30 kg/m   Vitals:   04/02/23 1357  BP: 130/84  Pulse: 90  Height: 5\' 5"  (1.651 m)  Weight: 146 lb (66.2 kg)  SpO2: 98%  BMI (Calculated): 24.3    Physical Exam Vitals and nursing note reviewed.  Constitutional:      Appearance: Normal appearance.  HENT:     Head: Normocephalic and atraumatic.     Nose: Nose normal.     Mouth/Throat:     Mouth: Mucous membranes are moist.     Pharynx: Oropharynx is clear.  Eyes:     Conjunctiva/sclera: Conjunctivae normal.     Pupils: Pupils are equal, round, and reactive to light.  Cardiovascular:     Rate and Rhythm: Normal rate and regular rhythm.     Pulses: Normal pulses.     Heart sounds: Normal heart sounds. No murmur heard. Pulmonary:     Effort: Pulmonary effort is normal.     Breath sounds: Rhonchi present. No wheezing.  Abdominal:     General: Bowel sounds are normal.     Palpations: Abdomen is soft.     Tenderness: There is no abdominal tenderness. There is no right CVA tenderness or left CVA tenderness.  Musculoskeletal:        General: Normal range of motion.     Cervical back: Normal range of motion.     Right lower leg: Edema present.     Left lower leg:  Edema present.  Skin:    General: Skin is warm and dry.  Neurological:     General: No focal deficit present.     Mental Status: She is alert and oriented to person, place, and time.  Psychiatric:        Mood and Affect: Mood normal.        Behavior: Behavior normal.      No results found for any visits on  04/02/23.      Assessment & Plan:  Patient advised to continue taking her medications.  Encourage p.o. fluid.  To keep her legs elevated while sitting down.  Patient also needs to get up slowly from her bed when she wakes up in the morning Problem List Items Addressed This Visit   None   Return in about 2 months (around 06/02/2023).   Total time spent: 30 minutes  Margaretann Loveless, MD  04/02/2023   This document may have been prepared by [facility] Voice Recognition software and as such may include unintentional dictation errors.

## 2023-04-03 ENCOUNTER — Telehealth: Payer: Self-pay

## 2023-04-05 ENCOUNTER — Other Ambulatory Visit: Payer: Self-pay | Admitting: Internal Medicine

## 2023-04-05 DIAGNOSIS — I2510 Atherosclerotic heart disease of native coronary artery without angina pectoris: Secondary | ICD-10-CM

## 2023-04-08 ENCOUNTER — Other Ambulatory Visit: Payer: Self-pay | Admitting: Internal Medicine

## 2023-04-08 DIAGNOSIS — I5033 Acute on chronic diastolic (congestive) heart failure: Secondary | ICD-10-CM

## 2023-04-08 DIAGNOSIS — M159 Polyosteoarthritis, unspecified: Secondary | ICD-10-CM

## 2023-04-11 NOTE — Telephone Encounter (Signed)
 Completed by Katherine Mantle.

## 2023-04-18 ENCOUNTER — Encounter: Payer: Self-pay | Admitting: Family

## 2023-04-18 ENCOUNTER — Telehealth: Payer: Self-pay

## 2023-04-18 NOTE — Progress Notes (Signed)
Acute Office Visit  Subjective:     Patient ID: Abigail Strong, female    DOB: [birth year], 89 y.o.   MRN: [id number]  Patient is in today for  Chief Complaint  Patient presents with   Dizziness    Lightheadedness and hot flashes.    Patient here with lightheaded/dizziness and hot flashes.  She says that she has been feeling dizzy, especially when she stands.     Dizziness This is a chronic problem. The current episode started more than 1 month ago. The problem occurs intermittently. The problem has been waxing and waning. The symptoms are aggravated by standing and bending. She has tried position changes, relaxation, rest, sleep, walking, eating and drinking for the symptoms. The treatment provided no relief.     Review of Systems  Constitutional:        Hot Flashes  Neurological:  Positive for dizziness.  All other systems reviewed and are negative.       Objective:    BP (!) 84/50   Pulse 73   Ht 5\' 5"  (1.651 m)   Wt 150 lb (68 kg)   SpO2 96%   BMI 24.96 kg/m   Physical Exam Vitals and nursing note reviewed.  Constitutional:      Appearance: Normal appearance. She is normal weight.  HENT:     Head: Normocephalic.  Eyes:     Pupils: Pupils are equal, round, and reactive to light.  Cardiovascular:     Rate and Rhythm: Normal rate.  Pulmonary:     Effort: Pulmonary effort is normal.  Neurological:     General: No focal deficit present.     Mental Status: She is alert and oriented to person, place, and time. Mental status is at baseline.  Psychiatric:        Mood and Affect: Mood normal.        Behavior: Behavior normal.        Thought Content: Thought content normal.        Judgment: Judgment normal.     Results for orders placed or performed in visit on 03/20/23  CBC with Diff  Result Value Ref Range   WBC 7.9 3.4 - 10.8 x10E3/uL   RBC 3.47 (L) 3.77 - 5.28 x10E6/uL   Hemoglobin 10.1 (L) 11.1 - 15.9 g/dL   Hematocrit 36.6 (L) 44.0 - 46.6 %    MCV 93 79 - 97 fL   MCH 29.1 26.6 - 33.0 pg   MCHC 31.5 31.5 - 35.7 g/dL   RDW 34.7 42.5 - 95.6 %   Platelets 292 150 - 450 x10E3/uL   Neutrophils 56 Not Estab. %   Lymphs 28 Not Estab. %   Monocytes 12 Not Estab. %   Eos 3 Not Estab. %   Basos 1 Not Estab. %   Neutrophils Absolute 4.4 1.4 - 7.0 x10E3/uL   Lymphocytes Absolute 2.2 0.7 - 3.1 x10E3/uL   Monocytes Absolute 0.9 0.1 - 0.9 x10E3/uL   EOS (ABSOLUTE) 0.3 0.0 - 0.4 x10E3/uL   Basophils Absolute 0.1 0.0 - 0.2 x10E3/uL   Immature Granulocytes 0 Not Estab. %   Immature Grans (Abs) 0.0 0.0 - 0.1 x10E3/uL  Iron, TIBC and Ferritin Panel  Result Value Ref Range   Total Iron Binding Capacity 251 250 - 450 ug/dL   UIBC 387 564 - 332 ug/dL   Iron 66 27 - 951 ug/dL   Iron Saturation 26 15 - 55 %   Ferritin 747 (H) 15 -  150 ng/mL  CMP14+EGFR  Result Value Ref Range   Glucose 81 70 - 99 mg/dL   BUN 39 (H) 10 - 36 mg/dL   Creatinine, Ser 1.61 (H) 0.57 - 1.00 mg/dL   eGFR 28 (L) >09 UE/AVW/0.98   BUN/Creatinine Ratio 23 12 - 28   Sodium 136 134 - 144 mmol/L   Potassium 5.1 3.5 - 5.2 mmol/L   Chloride 102 96 - 106 mmol/L   CO2 20 20 - 29 mmol/L   Calcium 9.2 8.7 - 10.3 mg/dL   Total Protein 5.9 (L) 6.0 - 8.5 g/dL   Albumin 4.0 3.6 - 4.6 g/dL   Globulin, Total 1.9 1.5 - 4.5 g/dL   Bilirubin Total 0.4 0.0 - 1.2 mg/dL   Alkaline Phosphatase 42 (L) 44 - 121 IU/L   AST 17 0 - 40 IU/L   ALT 6 0 - 32 IU/L  POCT Urinalysis Dipstick (81002)  Result Value Ref Range   Color, UA yellow    Clarity, UA clear    Glucose, UA Negative Negative   Bilirubin, UA neg    Ketones, UA neg    Spec Grav, UA 1.020 1.010 - 1.025   Blood, UA neg    pH, UA 6.0 5.0 - 8.0   Protein, UA Negative Negative   Urobilinogen, UA 0.2 0.2 or 1.0 E.U./dL   Nitrite, UA pos    Leukocytes, UA Negative Negative   Appearance clear    Odor none   POCT CBG (Fasting - Glucose)  Result Value Ref Range   Glucose Fasting, POC 98 70 - 99 mg/dL    Recent Results  (from the past 2160 hour(s))  TSH+T4F+T3Free     Status: Abnormal   Collection Time: 02/01/23  1:40 PM  Result Value Ref Range   TSH 0.715 0.450 - 4.500 uIU/mL   T3, Free 1.8 (L) 2.0 - 4.4 pg/mL   Free T4 1.78 (H) 0.82 - 1.77 ng/dL  CBC With Differential     Status: Abnormal   Collection Time: 02/01/23  1:40 PM  Result Value Ref Range   WBC 7.4 3.4 - 10.8 x10E3/uL   RBC 3.43 (L) 3.77 - 5.28 x10E6/uL   Hemoglobin 10.5 (L) 11.1 - 15.9 g/dL   Hematocrit 11.9 (L) 14.7 - 46.6 %   MCV 91 79 - 97 fL   MCH 30.6 26.6 - 33.0 pg   MCHC 33.7 31.5 - 35.7 g/dL   RDW 82.9 56.2 - 13.0 %   Neutrophils 46 Not Estab. %   Lymphs 41 Not Estab. %   Monocytes 10 Not Estab. %   Eos 2 Not Estab. %   Basos 1 Not Estab. %   Neutrophils Absolute 3.4 1.4 - 7.0 x10E3/uL   Lymphocytes Absolute 3.0 0.7 - 3.1 x10E3/uL   Monocytes Absolute 0.8 0.1 - 0.9 x10E3/uL   EOS (ABSOLUTE) 0.2 0.0 - 0.4 x10E3/uL   Basophils Absolute 0.1 0.0 - 0.2 x10E3/uL   Immature Granulocytes 0 Not Estab. %   Immature Grans (Abs) 0.0 0.0 - 0.1 x10E3/uL    Comment: **Effective March 18, 2023, profile [id number] CBC/Differential**   (No Platelet) will be made non-orderable. [facility] Offers:   [id number] CBC With Differential/Platelet   CMP14+EGFR     Status: Abnormal   Collection Time: 02/01/23  1:40 PM  Result Value Ref Range   Glucose 90 70 - 99 mg/dL   BUN 34 10 - 36 mg/dL   Creatinine, Ser 6.96 (H) 0.57 - 1.00 mg/dL   eGFR  25 (L) >59 mL/min/1.73   BUN/Creatinine Ratio 18 12 - 28   Sodium 135 134 - 144 mmol/L   Potassium 5.5 (H) 3.5 - 5.2 mmol/L   Chloride 100 96 - 106 mmol/L   CO2 22 20 - 29 mmol/L   Calcium 8.8 8.7 - 10.3 mg/dL   Total Protein 5.5 (L) 6.0 - 8.5 g/dL   Albumin 3.8 3.6 - 4.6 g/dL   Globulin, Total 1.7 1.5 - 4.5 g/dL   Bilirubin Total 0.4 0.0 - 1.2 mg/dL   Alkaline Phosphatase 41 (L) 44 - 121 IU/L   AST 15 0 - 40 IU/L   ALT 7 0 - 32 IU/L  CMP14+EGFR     Status: Abnormal   Collection Time: 02/07/23 12:19 PM   Result Value Ref Range   Glucose 87 70 - 99 mg/dL   BUN 29 10 - 36 mg/dL   Creatinine, Ser 6.57 (H) 0.57 - 1.00 mg/dL   eGFR 33 (L) >84 ON/GEX/5.28   BUN/Creatinine Ratio 20 12 - 28   Sodium 139 134 - 144 mmol/L   Potassium 5.8 (H) 3.5 - 5.2 mmol/L   Chloride 105 96 - 106 mmol/L   CO2 24 20 - 29 mmol/L   Calcium 9.6 8.7 - 10.3 mg/dL   Total Protein 6.0 6.0 - 8.5 g/dL   Albumin 4.1 3.6 - 4.6 g/dL   Globulin, Total 1.9 1.5 - 4.5 g/dL   Bilirubin Total 0.5 0.0 - 1.2 mg/dL   Alkaline Phosphatase 42 (L) 44 - 121 IU/L   AST 17 0 - 40 IU/L   ALT 5 0 - 32 IU/L  Potassium     Status: Abnormal   Collection Time: 02/14/23 12:05 PM  Result Value Ref Range   Potassium 5.6 (H) 3.5 - 5.2 mmol/L  POCT Urinalysis Dipstick (41324)     Status: None   Collection Time: 03/20/23  9:45 AM  Result Value Ref Range   Color, UA yellow    Clarity, UA clear    Glucose, UA Negative Negative   Bilirubin, UA neg    Ketones, UA neg    Spec Grav, UA 1.020 1.010 - 1.025   Blood, UA neg    pH, UA 6.0 5.0 - 8.0   Protein, UA Negative Negative   Urobilinogen, UA 0.2 0.2 or 1.0 E.U./dL   Nitrite, UA pos    Leukocytes, UA Negative Negative   Appearance clear    Odor none   POCT CBG (Fasting - Glucose)     Status: None   Collection Time: 03/20/23  9:50 AM  Result Value Ref Range   Glucose Fasting, POC 98 70 - 99 mg/dL  CBC with Diff     Status: Abnormal   Collection Time: 03/20/23 10:13 AM  Result Value Ref Range   WBC 7.9 3.4 - 10.8 x10E3/uL   RBC 3.47 (L) 3.77 - 5.28 x10E6/uL   Hemoglobin 10.1 (L) 11.1 - 15.9 g/dL   Hematocrit 40.1 (L) 02.7 - 46.6 %   MCV 93 79 - 97 fL   MCH 29.1 26.6 - 33.0 pg   MCHC 31.5 31.5 - 35.7 g/dL   RDW 25.3 66.4 - 40.3 %   Platelets 292 150 - 450 x10E3/uL   Neutrophils 56 Not Estab. %   Lymphs 28 Not Estab. %   Monocytes 12 Not Estab. %   Eos 3 Not Estab. %   Basos 1 Not Estab. %   Neutrophils Absolute 4.4 1.4 - 7.0 x10E3/uL  Lymphocytes Absolute 2.2 0.7 - 3.1  x10E3/uL   Monocytes Absolute 0.9 0.1 - 0.9 x10E3/uL   EOS (ABSOLUTE) 0.3 0.0 - 0.4 x10E3/uL   Basophils Absolute 0.1 0.0 - 0.2 x10E3/uL   Immature Granulocytes 0 Not Estab. %   Immature Grans (Abs) 0.0 0.0 - 0.1 x10E3/uL  Iron, TIBC and Ferritin Panel     Status: Abnormal   Collection Time: 03/20/23 10:13 AM  Result Value Ref Range   Total Iron Binding Capacity 251 250 - 450 ug/dL   UIBC 782 956 - 213 ug/dL   Iron 66 27 - 086 ug/dL   Iron Saturation 26 15 - 55 %   Ferritin 747 (H) 15 - 150 ng/mL  CMP14+EGFR     Status: Abnormal   Collection Time: 03/20/23 10:13 AM  Result Value Ref Range   Glucose 81 70 - 99 mg/dL   BUN 39 (H) 10 - 36 mg/dL   Creatinine, Ser 5.78 (H) 0.57 - 1.00 mg/dL   eGFR 28 (L) >46 NG/EXB/2.84   BUN/Creatinine Ratio 23 12 - 28   Sodium 136 134 - 144 mmol/L   Potassium 5.1 3.5 - 5.2 mmol/L   Chloride 102 96 - 106 mmol/L   CO2 20 20 - 29 mmol/L   Calcium 9.2 8.7 - 10.3 mg/dL   Total Protein 5.9 (L) 6.0 - 8.5 g/dL   Albumin 4.0 3.6 - 4.6 g/dL   Globulin, Total 1.9 1.5 - 4.5 g/dL   Bilirubin Total 0.4 0.0 - 1.2 mg/dL   Alkaline Phosphatase 42 (L) 44 - 121 IU/L   AST 17 0 - 40 IU/L   ALT 6 0 - 32 IU/L       Assessment & Plan:   Problem List Items Addressed This Visit   None Visit Diagnoses     Hot flashes    -  Primary   Relevant Orders   POCT Urinalysis Dipstick (81002) (Completed)   CBC with Diff (Completed)   Iron, TIBC and Ferritin Panel (Completed)   CMP14+EGFR (Completed)   Light headedness       Relevant Orders   POCT Urinalysis Dipstick (81002) (Completed)   POCT CBG (Fasting - Glucose) (Completed)   CBC with Diff (Completed)   Iron, TIBC and Ferritin Panel (Completed)   CMP14+EGFR (Completed)       Return in about 2 weeks (around 04/03/2023).  Total time spent: 20 minutes  Miki Kins, FNP  03/20/2023   This document may have been prepared by [facility] Voice Recognition software and as such may include unintentional  dictation errors.

## 2023-04-19 ENCOUNTER — Other Ambulatory Visit: Payer: Self-pay

## 2023-04-19 ENCOUNTER — Encounter: Payer: Self-pay | Admitting: Emergency Medicine

## 2023-04-19 ENCOUNTER — Emergency Department
Admission: EM | Admit: 2023-04-19 | Discharge: 2023-04-19 | Disposition: A | Discharge status: Home / Self Care | Payer: Medicare Other | Attending: Emergency Medicine | Admitting: Emergency Medicine

## 2023-04-19 ENCOUNTER — Emergency Department: Payer: Medicare Other

## 2023-04-19 DIAGNOSIS — Z7901 Long term (current) use of anticoagulants: Secondary | ICD-10-CM | POA: Diagnosis not present

## 2023-04-19 DIAGNOSIS — M25551 Pain in right hip: Secondary | ICD-10-CM | POA: Insufficient documentation

## 2023-04-19 DIAGNOSIS — E860 Dehydration: Secondary | ICD-10-CM | POA: Diagnosis not present

## 2023-04-19 DIAGNOSIS — E039 Hypothyroidism, unspecified: Secondary | ICD-10-CM | POA: Insufficient documentation

## 2023-04-19 DIAGNOSIS — Y9301 Activity, walking, marching and hiking: Secondary | ICD-10-CM | POA: Insufficient documentation

## 2023-04-19 DIAGNOSIS — W1830XA Fall on same level, unspecified, initial encounter: Secondary | ICD-10-CM | POA: Insufficient documentation

## 2023-04-19 DIAGNOSIS — Y92009 Unspecified place in unspecified non-institutional (private) residence as the place of occurrence of the external cause: Secondary | ICD-10-CM | POA: Insufficient documentation

## 2023-04-19 DIAGNOSIS — I672 Cerebral atherosclerosis: Secondary | ICD-10-CM | POA: Diagnosis not present

## 2023-04-19 DIAGNOSIS — I10 Essential (primary) hypertension: Secondary | ICD-10-CM | POA: Diagnosis not present

## 2023-04-19 DIAGNOSIS — M25561 Pain in right knee: Secondary | ICD-10-CM | POA: Insufficient documentation

## 2023-04-19 DIAGNOSIS — M25552 Pain in left hip: Secondary | ICD-10-CM | POA: Diagnosis not present

## 2023-04-19 DIAGNOSIS — M25562 Pain in left knee: Secondary | ICD-10-CM | POA: Diagnosis not present

## 2023-04-19 LAB — BASIC METABOLIC PANEL
Anion gap: 8 (ref 5–15)
BUN: 56 mg/dL — ABNORMAL HIGH (ref 8–23)
CO2: 25 mmol/L (ref 22–32)
Calcium: 9 mg/dL (ref 8.9–10.3)
Chloride: 101 mmol/L (ref 98–111)
Creatinine, Ser: 2.85 mg/dL — ABNORMAL HIGH (ref 0.44–1.00)
GFR, Estimated: 15 mL/min — ABNORMAL LOW (ref >60)
Glucose, Bld: 93 mg/dL (ref 70–99)
Potassium: 5.3 mmol/L — ABNORMAL HIGH (ref 3.5–5.1)
Sodium: 134 mmol/L — ABNORMAL LOW (ref 135–145)

## 2023-04-19 LAB — CBC WITH DIFFERENTIAL/PLATELET
Abs Immature Granulocytes: 0.01 10*3/uL (ref 0.00–0.07)
Basophils Absolute: 0.1 10*3/uL (ref 0.0–0.1)
Basophils Relative: 1 %
Eosinophils Absolute: 0.2 10*3/uL (ref 0.0–0.5)
Eosinophils Relative: 2 %
HCT: 32.8 % — ABNORMAL LOW (ref 36.0–46.0)
Hemoglobin: 10.8 g/dL — ABNORMAL LOW (ref 12.0–15.0)
Immature Granulocytes: 0 %
Lymphocytes Relative: 41 %
Lymphs Abs: 2.9 10*3/uL (ref 0.7–4.0)
MCH: 30.4 pg (ref 26.0–34.0)
MCHC: 32.9 g/dL (ref 30.0–36.0)
MCV: 92.4 fL (ref 80.0–100.0)
Monocytes Absolute: 0.7 10*3/uL (ref 0.1–1.0)
Monocytes Relative: 9 %
Neutro Abs: 3.4 10*3/uL (ref 1.7–7.7)
Neutrophils Relative %: 47 %
Platelets: 244 10*3/uL (ref 150–400)
RBC: 3.55 MIL/uL — ABNORMAL LOW (ref 3.87–5.11)
RDW: 13.3 % (ref 11.5–15.5)
WBC: 7.2 10*3/uL (ref 4.0–10.5)
nRBC: 0 % (ref 0.0–0.2)

## 2023-04-19 MED ORDER — FENTANYL CITRATE PF 50 MCG/ML IJ SOSY
50.0000 ug | PREFILLED_SYRINGE | Freq: Once | INTRAMUSCULAR | Status: AC
  Administered 2023-04-19: 50 ug via INTRAVENOUS
  Filled 2023-04-19: qty 1

## 2023-04-19 MED ORDER — SODIUM CHLORIDE 0.9 % IV BOLUS
500.0000 mL | Freq: Once | INTRAVENOUS | Status: AC
  Administered 2023-04-19: 500 mL via INTRAVENOUS

## 2023-04-19 NOTE — ED Notes (Signed)
A purwick was placed on the pt due to left sided hip pain.

## 2023-04-19 NOTE — ED Provider Notes (Signed)
[facility] Provider Note    Event Date/Time   First MD Initiated Contact with Patient 04/19/23 1046     (approximate)   History   Chief Complaint: Fall   HPI  Abigail Strong is a 89 y.o. female with a history of GERD, hypothyroidism, hypertension who comes ED due to a fall.  Patient reports she was walking into her room and lost her balance, fell on the bilateral knees.  Hit her nose on a dresser.  Denies loss of consciousness or neck pain.  Reports pain in bilateral knees and hips.     Physical Exam   Triage Vital Signs: ED Triage Vitals  Encounter Vitals Group     BP 04/19/23 1040 104/71     Systolic BP Percentile --      Diastolic BP Percentile --      Pulse Rate 04/19/23 1040 60     Resp 04/19/23 1040 18     Temp 04/19/23 1040 (!) 95.5 F (35.3 C)     Temp Source 04/19/23 1040 Oral     SpO2 04/19/23 1040 97 %     Weight 04/19/23 1041 145 lb 15.1 oz (66.2 kg)     Height 04/19/23 1041 5\' 5"  (1.651 m)     Head Circumference --      Peak Flow --      Pain Score 04/19/23 1040 6     Pain Loc --      Pain Education --      Exclude from Growth Chart --     Most recent vital signs: Vitals:   04/19/23 1040 04/19/23 1051  BP: 104/71   Pulse: 60   Resp: 18   Temp: (!) 95.5 F (35.3 C) (!) 97.5 F (36.4 C)  SpO2: 97%     General: Awake, no distress.  CV:  Good peripheral perfusion.  Regular rate and rhythm Resp:  Normal effort.  Clear to auscultation bilaterally Abd:  No distention.  Soft nontender Other:  Intact range of motion in all extremities.  Tenderness in bilateral knees and bilateral hips.  Pelvis and long bones are stable.   ED Results / Procedures / Treatments   Labs (all labs ordered are listed, but only abnormal results are displayed) Labs Reviewed  BASIC METABOLIC PANEL - Abnormal; Notable for the following components:      Result Value   Sodium 134 (*)    Potassium 5.3 (*)    BUN 56 (*)    Creatinine, Ser 2.85  (*)    GFR, Estimated 15 (*)    All other components within normal limits  CBC WITH DIFFERENTIAL/PLATELET - Abnormal; Notable for the following components:   RBC 3.55 (*)    Hemoglobin 10.8 (*)    HCT 32.8 (*)    All other components within normal limits     EKG    RADIOLOGY X-ray left hip and pelvis interpreted by me, negative for fracture.  Radiology report reviewed   X-ray of bilateral knees negative CT head negative   PROCEDURES:  Procedures   MEDICATIONS ORDERED IN ED: Medications  fentaNYL (SUBLIMAZE) injection 50 mcg (50 mcg Intravenous Given 04/19/23 1110)  sodium chloride 0.9 % bolus 500 mL (500 mLs Intravenous New Bag/Given 04/19/23 1316)     IMPRESSION / MDM / ASSESSMENT AND PLAN / ED COURSE  I reviewed the triage vital signs and the nursing notes.  DDx: Knee fracture, hip fracture, pelvis fracture, intracranial hemorrhage, AKI, dehydration, electrolyte abnormality,  anemia  Patient's presentation is most consistent with acute presentation with potential threat to life or bodily function.  Patient presents after mechanical fall at home.  No worrisome prodromal symptoms.  Trauma workup with imaging is unremarkable.  Labs do show mild acute on chronic renal insufficiency, suspect overdiuresis.  Will give IV fluids for hydration.   ----------------------------------------- 2:08 PM on 04/19/2023 ----------------------------------------- Patient feeling well, ambulatory with walker.  Results discussed with family at bedside.  Stable for discharge     FINAL CLINICAL IMPRESSION(S) / ED DIAGNOSES   Final diagnoses:  Fall in home, initial encounter  Dehydration     Rx / DC Orders   ED Discharge Orders     None        Note:  This document was prepared using Dragon voice recognition software and may include unintentional dictation errors.   Sharman Cheek, MD 04/19/23 [phone]

## 2023-04-19 NOTE — ED Triage Notes (Signed)
Arrives from home via [facility].   Fall.  Hx fall, landed on knees.  Left knee swelling with deformity.  Takes Eliquis  No LOC.  VS wnl.  22 LAC 50 mcg fentanyl

## 2023-04-19 NOTE — ED Notes (Signed)
Pt is at radiology

## 2023-04-23 ENCOUNTER — Ambulatory Visit: Payer: Medicare Other | Admitting: Cardiovascular Disease

## 2023-04-23 ENCOUNTER — Encounter: Payer: Self-pay | Admitting: Cardiovascular Disease

## 2023-04-23 VITALS — BP 99/61 | HR 62 | Ht 65.0 in | Wt 146.0 lb

## 2023-04-23 DIAGNOSIS — I10 Essential (primary) hypertension: Secondary | ICD-10-CM

## 2023-04-23 DIAGNOSIS — I5033 Acute on chronic diastolic (congestive) heart failure: Secondary | ICD-10-CM

## 2023-04-23 DIAGNOSIS — E782 Mixed hyperlipidemia: Secondary | ICD-10-CM

## 2023-04-23 DIAGNOSIS — W19XXXS Unspecified fall, sequela: Secondary | ICD-10-CM

## 2023-04-23 DIAGNOSIS — R55 Syncope and collapse: Secondary | ICD-10-CM

## 2023-04-23 DIAGNOSIS — I482 Chronic atrial fibrillation, unspecified: Secondary | ICD-10-CM | POA: Diagnosis not present

## 2023-04-23 DIAGNOSIS — R600 Localized edema: Secondary | ICD-10-CM

## 2023-04-23 DIAGNOSIS — I872 Venous insufficiency (chronic) (peripheral): Secondary | ICD-10-CM

## 2023-04-23 MED ORDER — LISINOPRIL 2.5 MG PO TABS: 2.5000 mg | ORAL_TABLET | Freq: Every day | ORAL | 11 refills | Status: DC

## 2023-04-23 NOTE — Progress Notes (Signed)
Cardiology Office Note   Date:  04/23/2023   ID:  Israh, Helle 02-23-1930, MRN [id number]  PCP:  Margaretann Loveless, MD  Cardiologist:  Adrian Blackwater, MD      History of Present Illness: Abigail Strong is a 89 y.o. female who presents for  Chief Complaint  Patient presents with   Follow-up    Admitted with fall, and had UTI, ct negative and creat went up.      Past Medical History:  Diagnosis Date   Depression    GERD (gastroesophageal reflux disease)    Hyperlipidemia    Hyperlipidemia, unspecified 01/01/2014   Hypothyroidism    Insomnia    Musculoskeletal chest pain 09/27/2016     Past Surgical History:  Procedure Laterality Date   ABDOMINAL HYSTERECTOMY     BREAST BIOPSY Bilateral 90's   benign   cataracts     COLON SURGERY     partial colectomy   intestinal blockage     NISSEN FUNDOPLICATION       Current Outpatient Medications  Medication Sig Dispense Refill   lisinopril (ZESTRIL) 2.5 MG tablet Take 1 tablet (2.5 mg total) by mouth daily. 30 tablet 11   acetaminophen (TYLENOL) 500 MG tablet Take 1,000 mg by mouth every 6 (six) hours as needed for mild pain or moderate pain.     alendronate (FOSAMAX) 70 MG tablet Take 1 tablet by mouth once a week. (Patient not taking: Reported on 02/01/2023)     carvedilol (COREG) 3.125 MG tablet TAKE 1 TABLET(3.125 MG) BY MOUTH TWICE DAILY WITH A MEAL 60 tablet 3   ELIQUIS 2.5 MG TABS tablet Take 1 tablet (2.5 mg total) by mouth 2 (two) times daily. 60 tablet 6   fenofibrate 54 MG tablet TAKE 1 TABLET(54 MG) BY MOUTH DAILY 30 tablet 3   fluticasone (FLONASE) 50 MCG/ACT nasal spray Place 1 spray into both nostrils daily. 16 g 2   hydrOXYzine (ATARAX) 25 MG tablet Take 1 tablet (25 mg total) by mouth every 6 (six) hours as needed. 30 tablet 4   Iron-FA-B Cmp-C-Biot-Probiotic (FUSION PLUS) CAPS Take 1 capsule by mouth daily. 30 capsule 5   levothyroxine (LEVOXYL) 112 MCG tablet Take 1 tablet (112 mcg total) by  mouth daily before breakfast. (Patient not taking: Reported on 02/01/2023) 30 tablet 2   levothyroxine (SYNTHROID) 125 MCG tablet Take 125 mcg by mouth daily before breakfast.     meclizine (ANTIVERT) 12.5 MG tablet Take 1 tablet (12.5 mg total) by mouth 2 (two) times daily as needed for dizziness. 60 tablet 0   nitrofurantoin, macrocrystal-monohydrate, (MACROBID) 100 MG capsule Take 100 mg by mouth 2 (two) times daily.     pantoprazole (PROTONIX) 40 MG tablet Take 1 tablet (40 mg total) by mouth daily. 30 tablet 4   ranolazine (RANEXA) 500 MG 12 hr tablet TAKE 1 TABLET(500 MG) BY MOUTH TWICE DAILY 90 tablet 1   rosuvastatin (CRESTOR) 20 MG tablet Take 1 tablet (20 mg total) by mouth daily. (Patient not taking: Reported on 02/01/2023) 90 tablet 1   torsemide (DEMADEX) 10 MG tablet Take 1 tablet (10 mg total) by mouth daily. 90 tablet 1   No current facility-administered medications for this visit.    Allergies:   Gabapentin, Sulfa antibiotics, and Sulfonamide derivatives    Social History:   reports that she has never smoked. She has never used smokeless tobacco. She reports that she does not drink alcohol and does not use drugs.  Family History:  family history includes Breast cancer in her maternal aunt; Cervical cancer in her mother.    ROS:     ROS    All other systems are reviewed and negative.    PHYSICAL EXAM: VS:  BP 99/61   Pulse 62   Ht 5\' 5"  (1.651 m)   Wt 146 lb (66.2 kg)   SpO2 93%   BMI 24.30 kg/m  , BMI Body mass index is 24.3 kg/m. Last weight:  Wt Readings from Last 3 Encounters:  04/23/23 146 lb (66.2 kg)  04/19/23 145 lb 15.1 oz (66.2 kg)  04/02/23 146 lb (66.2 kg)     Physical Exam    EKG:   Recent Labs: 02/01/2023: TSH 0.715 03/20/2023: ALT 6 04/19/2023: BUN 56; Creatinine, Ser 2.85; Hemoglobin 10.8; Platelets 244; Potassium 5.3; Sodium 134    Lipid Panel    Component Value Date/Time   CHOL 162 10/04/2022 1208   TRIG 92 10/04/2022 1208    HDL 82 10/04/2022 1208   CHOLHDL 2.6 04/26/2020 1606   LDLCALC 63 10/04/2022 1208   LDLCALC 92 04/26/2020 1606      Other studies Reviewed: Additional studies/ records that were reviewed today include:  Review of the above records demonstrates:      06/02/2019    1:37 PM  PAD Screen  Previous PAD dx? No  Previous surgical procedure? No  Pain with walking? Yes  Subsides with rest? No  Painful, non-healing ulcers? No  Extremities discolored? Yes      ASSESSMENT AND PLAN:    ICD-10-CM   1. Fall, sequela  W19.XXXS PCV ECHOCARDIOGRAM COMPLETE    lisinopril (ZESTRIL) 2.5 MG tablet    PCV ECHOCARDIOGRAM COMPLETE   On 83/0 had fall, ct head negative , creat 1.7 to 2.8, worsening renal disease probably from UTI.    2. Essential hypertension  I10 PCV ECHOCARDIOGRAM COMPLETE    lisinopril (ZESTRIL) 2.5 MG tablet    PCV ECHOCARDIOGRAM COMPLETE    3. Edema of lower leg due to peripheral venous insufficiency  I87.2 PCV ECHOCARDIOGRAM COMPLETE   R60.0 lisinopril (ZESTRIL) 2.5 MG tablet    PCV ECHOCARDIOGRAM COMPLETE    4. Chronic atrial fibrillation (HCC)  I48.20 PCV ECHOCARDIOGRAM COMPLETE    lisinopril (ZESTRIL) 2.5 MG tablet    PCV ECHOCARDIOGRAM COMPLETE    5. CHF (congestive heart failure), NYHA class III, acute on chronic, diastolic (HCC)  I50.33 PCV ECHOCARDIOGRAM COMPLETE    lisinopril (ZESTRIL) 2.5 MG tablet    PCV ECHOCARDIOGRAM COMPLETE   BP sytoic below 100, will stop entresto, change to zestoril 2.5    6. Mixed hyperlipidemia  E78.2 PCV ECHOCARDIOGRAM COMPLETE    lisinopril (ZESTRIL) 2.5 MG tablet    PCV ECHOCARDIOGRAM COMPLETE    7. Syncope, unspecified syncope type  R55 PCV ECHOCARDIOGRAM COMPLETE    lisinopril (ZESTRIL) 2.5 MG tablet    PCV ECHOCARDIOGRAM COMPLETE       Problem List Items Addressed This Visit       Cardiovascular and Mediastinum   Essential hypertension   Relevant Medications   lisinopril (ZESTRIL) 2.5 MG tablet   Other Relevant  Orders   PCV ECHOCARDIOGRAM COMPLETE   PCV ECHOCARDIOGRAM COMPLETE   Edema of lower leg due to peripheral venous insufficiency   Relevant Medications   lisinopril (ZESTRIL) 2.5 MG tablet   Other Relevant Orders   PCV ECHOCARDIOGRAM COMPLETE   PCV ECHOCARDIOGRAM COMPLETE   CHF (congestive heart failure), NYHA class III, acute on chronic, diastolic (  HCC)   Relevant Medications   lisinopril (ZESTRIL) 2.5 MG tablet   Other Relevant Orders   PCV ECHOCARDIOGRAM COMPLETE   PCV ECHOCARDIOGRAM COMPLETE   Chronic atrial fibrillation (HCC)   Relevant Medications   lisinopril (ZESTRIL) 2.5 MG tablet   Other Relevant Orders   PCV ECHOCARDIOGRAM COMPLETE   PCV ECHOCARDIOGRAM COMPLETE     Other   Mixed hyperlipidemia   Relevant Medications   lisinopril (ZESTRIL) 2.5 MG tablet   Other Relevant Orders   PCV ECHOCARDIOGRAM COMPLETE   PCV ECHOCARDIOGRAM COMPLETE   Other Visit Diagnoses     Fall, sequela    -  Primary   On 83/0 had fall, ct head negative , creat 1.7 to 2.8, worsening renal disease probably from UTI.   Relevant Medications   lisinopril (ZESTRIL) 2.5 MG tablet   Other Relevant Orders   PCV ECHOCARDIOGRAM COMPLETE   PCV ECHOCARDIOGRAM COMPLETE   Syncope, unspecified syncope type       Relevant Medications   lisinopril (ZESTRIL) 2.5 MG tablet   Other Relevant Orders   PCV ECHOCARDIOGRAM COMPLETE   PCV ECHOCARDIOGRAM COMPLETE          Disposition:   Return in about 4 weeks (around 05/21/2023) for echo, and f/u.    Total time spent: 35 minutes  Signed,  Adrian Blackwater, MD  04/23/2023 2:21 PM    [facility]

## 2023-04-25 ENCOUNTER — Telehealth: Payer: Self-pay | Admitting: Internal Medicine

## 2023-04-25 NOTE — Telephone Encounter (Signed)
Patient left VM that the person who brought her to her appt on Tuesday has now tested positive for Covid. Patient herself does not have Covid at this time. She wants to know what she should do? Please advise.

## 2023-05-03 ENCOUNTER — Ambulatory Visit: Payer: Medicare Other | Admitting: Internal Medicine

## 2023-05-09 ENCOUNTER — Other Ambulatory Visit: Payer: Self-pay | Admitting: Internal Medicine

## 2023-05-14 ENCOUNTER — Telehealth: Payer: Self-pay | Admitting: Internal Medicine

## 2023-05-14 ENCOUNTER — Ambulatory Visit (INDEPENDENT_AMBULATORY_CARE_PROVIDER_SITE_OTHER): Payer: Medicare Other | Admitting: Internal Medicine

## 2023-05-14 ENCOUNTER — Encounter: Payer: Self-pay | Admitting: Internal Medicine

## 2023-05-14 DIAGNOSIS — R350 Frequency of micturition: Secondary | ICD-10-CM

## 2023-05-14 LAB — POCT URINALYSIS DIPSTICK
Bilirubin, UA: NEGATIVE
Blood, UA: NEGATIVE
Glucose, UA: NEGATIVE
Ketones, UA: NEGATIVE
Leukocytes, UA: NEGATIVE
Nitrite, UA: NEGATIVE
Protein, UA: NEGATIVE
Spec Grav, UA: 1.01 (ref 1.010–1.025)
Urobilinogen, UA: 0.2 E.U./dL
pH, UA: 5.5 (ref 5.0–8.0)

## 2023-05-14 NOTE — Telephone Encounter (Signed)
Patient called wanting to have her granddaughter come by and drop off a urine specimen because she is having urinary frequency. Advised her that is okay.

## 2023-05-14 NOTE — Progress Notes (Signed)
Patient notified

## 2023-05-15 ENCOUNTER — Other Ambulatory Visit: Payer: Self-pay | Admitting: Internal Medicine

## 2023-05-15 DIAGNOSIS — F419 Anxiety disorder, unspecified: Secondary | ICD-10-CM

## 2023-05-23 ENCOUNTER — Other Ambulatory Visit: Payer: Medicare Other

## 2023-05-27 ENCOUNTER — Ambulatory Visit (INDEPENDENT_AMBULATORY_CARE_PROVIDER_SITE_OTHER): Payer: Medicare Other

## 2023-05-27 ENCOUNTER — Other Ambulatory Visit: Payer: Self-pay

## 2023-05-27 DIAGNOSIS — I872 Venous insufficiency (chronic) (peripheral): Secondary | ICD-10-CM

## 2023-05-27 DIAGNOSIS — E782 Mixed hyperlipidemia: Secondary | ICD-10-CM

## 2023-05-27 DIAGNOSIS — I340 Nonrheumatic mitral (valve) insufficiency: Secondary | ICD-10-CM

## 2023-05-27 DIAGNOSIS — I351 Nonrheumatic aortic (valve) insufficiency: Secondary | ICD-10-CM | POA: Diagnosis not present

## 2023-05-27 DIAGNOSIS — I482 Chronic atrial fibrillation, unspecified: Secondary | ICD-10-CM

## 2023-05-27 DIAGNOSIS — W19XXXS Unspecified fall, sequela: Secondary | ICD-10-CM

## 2023-05-27 DIAGNOSIS — I361 Nonrheumatic tricuspid (valve) insufficiency: Secondary | ICD-10-CM | POA: Diagnosis not present

## 2023-05-27 DIAGNOSIS — I371 Nonrheumatic pulmonary valve insufficiency: Secondary | ICD-10-CM

## 2023-05-27 DIAGNOSIS — I10 Essential (primary) hypertension: Secondary | ICD-10-CM

## 2023-05-27 DIAGNOSIS — I5033 Acute on chronic diastolic (congestive) heart failure: Secondary | ICD-10-CM

## 2023-05-27 DIAGNOSIS — R55 Syncope and collapse: Secondary | ICD-10-CM

## 2023-05-28 ENCOUNTER — Ambulatory Visit (INDEPENDENT_AMBULATORY_CARE_PROVIDER_SITE_OTHER): Payer: Medicare Other | Admitting: Cardiovascular Disease

## 2023-05-28 ENCOUNTER — Encounter: Payer: Self-pay | Admitting: Cardiovascular Disease

## 2023-05-28 VITALS — BP 90/62 | HR 63 | Ht 62.0 in | Wt 140.0 lb

## 2023-05-28 DIAGNOSIS — R058 Other specified cough: Secondary | ICD-10-CM

## 2023-05-28 DIAGNOSIS — I872 Venous insufficiency (chronic) (peripheral): Secondary | ICD-10-CM

## 2023-05-28 DIAGNOSIS — I482 Chronic atrial fibrillation, unspecified: Secondary | ICD-10-CM

## 2023-05-28 DIAGNOSIS — I5033 Acute on chronic diastolic (congestive) heart failure: Secondary | ICD-10-CM | POA: Diagnosis not present

## 2023-05-28 DIAGNOSIS — R600 Localized edema: Secondary | ICD-10-CM

## 2023-05-28 DIAGNOSIS — I10 Essential (primary) hypertension: Secondary | ICD-10-CM | POA: Diagnosis not present

## 2023-05-28 DIAGNOSIS — T464X5A Adverse effect of angiotensin-converting-enzyme inhibitors, initial encounter: Secondary | ICD-10-CM

## 2023-05-28 MED ORDER — TORSEMIDE 10 MG PO TABS: 10.0000 mg | ORAL_TABLET | Freq: Two times a day (BID) | ORAL | 11 refills | Status: DC

## 2023-05-28 MED ORDER — ELIQUIS 2.5 MG PO TABS
2.5000 mg | ORAL_TABLET | Freq: Two times a day (BID) | ORAL | 6 refills | Status: DC
Start: 2023-05-28 — End: 2024-04-15

## 2023-05-28 NOTE — Addendum Note (Signed)
Addended by: Adrian Blackwater A on: 05/28/2023 02:04 PM   Modules accepted: Orders

## 2023-05-28 NOTE — Progress Notes (Signed)
Cardiology Office Note   Date:  05/28/2023   ID:  Teashia, Trzaska [birth year], MRN [id number]  PCP:  Margaretann Loveless, MD  Cardiologist:  Adrian Blackwater, MD      History of Present Illness: Abigail Strong is a 89 y.o. female who presents for No chief complaint on file.   Feels sick, coughing      Past Medical History:  Diagnosis Date   Depression    GERD (gastroesophageal reflux disease)    Hyperlipidemia    Hyperlipidemia, unspecified 01/01/2014   Hypothyroidism    Insomnia    Musculoskeletal chest pain 09/27/2016     Past Surgical History:  Procedure Laterality Date   ABDOMINAL HYSTERECTOMY     BREAST BIOPSY Bilateral 90's   benign   cataracts     COLON SURGERY     partial colectomy   intestinal blockage     NISSEN FUNDOPLICATION       Current Outpatient Medications  Medication Sig Dispense Refill   acetaminophen (TYLENOL) 500 MG tablet Take 1,000 mg by mouth every 6 (six) hours as needed for mild pain or moderate pain.     alendronate (FOSAMAX) 70 MG tablet Take 1 tablet by mouth once a week. (Patient not taking: Reported on 02/01/2023)     ELIQUIS 2.5 MG TABS tablet Take 1 tablet (2.5 mg total) by mouth 2 (two) times daily. 60 tablet 6   fenofibrate 54 MG tablet TAKE 1 TABLET(54 MG) BY MOUTH DAILY 30 tablet 3   fluticasone (FLONASE) 50 MCG/ACT nasal spray Place 1 spray into both nostrils daily. 16 g 2   hydrOXYzine (ATARAX) 25 MG tablet TAKE 1 TABLET(25 MG) BY MOUTH EVERY 6 HOURS AS NEEDED 30 tablet 4   Iron-FA-B Cmp-C-Biot-Probiotic (FUSION PLUS) CAPS Take 1 capsule by mouth daily. 30 capsule 5   levothyroxine (LEVOXYL) 112 MCG tablet Take 1 tablet (112 mcg total) by mouth daily before breakfast. (Patient not taking: Reported on 02/01/2023) 30 tablet 2   levothyroxine (SYNTHROID) 125 MCG tablet Take 125 mcg by mouth daily before breakfast.     meclizine (ANTIVERT) 12.5 MG tablet Take 1 tablet (12.5 mg total) by mouth 2 (two) times daily as needed for  dizziness. 60 tablet 0   nitrofurantoin, macrocrystal-monohydrate, (MACROBID) 100 MG capsule Take 100 mg by mouth 2 (two) times daily.     pantoprazole (PROTONIX) 40 MG tablet Take 1 tablet (40 mg total) by mouth daily. 30 tablet 4   ranolazine (RANEXA) 500 MG 12 hr tablet TAKE 1 TABLET(500 MG) BY MOUTH TWICE DAILY 90 tablet 1   rosuvastatin (CRESTOR) 20 MG tablet Take 1 tablet (20 mg total) by mouth daily. (Patient not taking: Reported on 02/01/2023) 90 tablet 1   torsemide (DEMADEX) 10 MG tablet Take 1 tablet (10 mg total) by mouth daily. 90 tablet 1   No current facility-administered medications for this visit.    Allergies:   Gabapentin, Sulfa antibiotics, and Sulfonamide derivatives    Social History:   reports that she has never smoked. She has never used smokeless tobacco. She reports that she does not drink alcohol and does not use drugs.   Family History:  family history includes Breast cancer in her maternal aunt; Cervical cancer in her mother.    ROS:     Review of Systems  Constitutional: Negative.   HENT: Negative.    Eyes: Negative.   Respiratory: Negative.    Gastrointestinal: Negative.   Genitourinary: Negative.   Musculoskeletal:  Negative.   Skin: Negative.   Neurological: Negative.   Endo/Heme/Allergies: Negative.   Psychiatric/Behavioral: Negative.    All other systems reviewed and are negative.     All other systems are reviewed and negative.    PHYSICAL EXAM: VS:  BP 90/62   Pulse 63   Ht 5\' 2"  (1.575 m)   Wt 140 lb (63.5 kg)   SpO2 96%   BMI 25.61 kg/m  , BMI Body mass index is 25.61 kg/m. Last weight:  Wt Readings from Last 3 Encounters:  05/28/23 140 lb (63.5 kg)  04/23/23 146 lb (66.2 kg)  04/19/23 145 lb 15.1 oz (66.2 kg)     Physical Exam Constitutional:      Appearance: Normal appearance.  Cardiovascular:     Rate and Rhythm: Normal rate and regular rhythm.     Heart sounds: Normal heart sounds.  Pulmonary:     Effort: Pulmonary  effort is normal.     Breath sounds: Normal breath sounds.  Musculoskeletal:     Right lower leg: No edema.     Left lower leg: No edema.  Neurological:     Mental Status: She is alert.       EKG:   Recent Labs: 02/01/2023: TSH 0.715 03/20/2023: ALT 6 04/19/2023: BUN 56; Creatinine, Ser 2.85; Hemoglobin 10.8; Platelets 244; Potassium 5.3; Sodium 134    Lipid Panel    Component Value Date/Time   CHOL 162 10/04/2022 1208   TRIG 92 10/04/2022 1208   HDL 82 10/04/2022 1208   CHOLHDL 2.6 04/26/2020 1606   LDLCALC 63 10/04/2022 1208   LDLCALC 92 04/26/2020 1606      Other studies Reviewed: Additional studies/ records that were reviewed today include:  Review of the above records demonstrates:      06/02/2019    1:37 PM  PAD Screen  Previous PAD dx? No  Previous surgical procedure? No  Pain with walking? Yes  Subsides with rest? No  Painful, non-healing ulcers? No  Extremities discolored? Yes      ASSESSMENT AND PLAN:    ICD-10-CM   1. Essential hypertension  I10     2. Edema of lower leg due to peripheral venous insufficiency  I87.2    R60.0     3. Chronic atrial fibrillation (HCC)  I48.20     4. CHF (congestive heart failure), NYHA class III, acute on chronic, diastolic (HCC)  I50.33    stopped coreg and zestoril as BP is low,continue demdex.  Cough, zesoril can be the cause. Echo had normal LVEF , diastolic dysfunction, farxiga may worsen CRI    5. Cough due to ACE inhibitor  R05.8    T46.4X5A    stop zestoril       Problem List Items Addressed This Visit       Cardiovascular and Mediastinum   Essential hypertension - Primary   Edema of lower leg due to peripheral venous insufficiency   CHF (congestive heart failure), NYHA class III, acute on chronic, diastolic (HCC)   Chronic atrial fibrillation (HCC)   Other Visit Diagnoses     Cough due to ACE inhibitor       stop zestoril          Disposition:   Return in about 5 weeks (around  07/02/2023).    Total time spent: 35 minutes  Signed,  Adrian Blackwater, MD  05/28/2023 1:54 PM    [facility]

## 2023-05-28 NOTE — Patient Instructions (Signed)
Stop zestoril and coreg and change demedex 10 twice a day

## 2023-05-30 ENCOUNTER — Other Ambulatory Visit: Payer: Self-pay | Admitting: Internal Medicine

## 2023-05-30 DIAGNOSIS — F419 Anxiety disorder, unspecified: Secondary | ICD-10-CM

## 2023-05-30 DIAGNOSIS — F32A Depression, unspecified: Secondary | ICD-10-CM

## 2023-06-03 ENCOUNTER — Emergency Department: Payer: Medicare Other

## 2023-06-03 ENCOUNTER — Other Ambulatory Visit: Payer: Self-pay

## 2023-06-03 ENCOUNTER — Ambulatory Visit: Payer: Medicare Other | Admitting: Internal Medicine

## 2023-06-03 ENCOUNTER — Emergency Department
Admission: EM | Admit: 2023-06-03 | Discharge: 2023-06-04 | Disposition: A | Discharge status: Home / Self Care | Payer: Medicare Other | Attending: Emergency Medicine | Admitting: Emergency Medicine

## 2023-06-03 DIAGNOSIS — S065XAA Traumatic subdural hemorrhage with loss of consciousness status unknown, initial encounter: Secondary | ICD-10-CM

## 2023-06-03 DIAGNOSIS — Z79899 Other long term (current) drug therapy: Secondary | ICD-10-CM | POA: Insufficient documentation

## 2023-06-03 DIAGNOSIS — N189 Chronic kidney disease, unspecified: Secondary | ICD-10-CM | POA: Insufficient documentation

## 2023-06-03 DIAGNOSIS — I609 Nontraumatic subarachnoid hemorrhage, unspecified: Secondary | ICD-10-CM | POA: Diagnosis not present

## 2023-06-03 DIAGNOSIS — Z7901 Long term (current) use of anticoagulants: Secondary | ICD-10-CM | POA: Insufficient documentation

## 2023-06-03 DIAGNOSIS — S065X0A Traumatic subdural hemorrhage without loss of consciousness, initial encounter: Secondary | ICD-10-CM | POA: Diagnosis not present

## 2023-06-03 DIAGNOSIS — I509 Heart failure, unspecified: Secondary | ICD-10-CM | POA: Diagnosis not present

## 2023-06-03 DIAGNOSIS — W19XXXA Unspecified fall, initial encounter: Secondary | ICD-10-CM

## 2023-06-03 DIAGNOSIS — W07XXXA Fall from chair, initial encounter: Secondary | ICD-10-CM | POA: Diagnosis not present

## 2023-06-03 DIAGNOSIS — S066X0A Traumatic subarachnoid hemorrhage without loss of consciousness, initial encounter: Secondary | ICD-10-CM | POA: Insufficient documentation

## 2023-06-03 DIAGNOSIS — E039 Hypothyroidism, unspecified: Secondary | ICD-10-CM | POA: Insufficient documentation

## 2023-06-03 DIAGNOSIS — I130 Hypertensive heart and chronic kidney disease with heart failure and stage 1 through stage 4 chronic kidney disease, or unspecified chronic kidney disease: Secondary | ICD-10-CM | POA: Insufficient documentation

## 2023-06-03 DIAGNOSIS — S066XAA Traumatic subarachnoid hemorrhage with loss of consciousness status unknown, initial encounter: Secondary | ICD-10-CM | POA: Diagnosis not present

## 2023-06-03 DIAGNOSIS — S0990XA Unspecified injury of head, initial encounter: Secondary | ICD-10-CM | POA: Diagnosis present

## 2023-06-03 DIAGNOSIS — S0003XA Contusion of scalp, initial encounter: Secondary | ICD-10-CM | POA: Diagnosis not present

## 2023-06-03 NOTE — ED Provider Notes (Signed)
[facility] Provider Note    Event Date/Time   First MD Initiated Contact with Patient 06/03/23 2303     (approximate)   History   Fall   HPI  Abigail Strong is a 89 y.o. female with history of hyperlipidemia, hypothyroidism, hypertension, A-fib on Eliquis, CHF, chronic kidney disease who presents to the emergency department after she fell out of her lift chair tonight.  States she did hit the right side of her head.  States she is not sure if she lost consciousness.  Did have mild headache which has resolved.  No neck or back pain.  No other preceding symptoms that led to her fall.  Patient lives at home.   History provided by patient.    Past Medical History:  Diagnosis Date   Depression    GERD (gastroesophageal reflux disease)    Hyperlipidemia    Hyperlipidemia, unspecified 01/01/2014   Hypothyroidism    Insomnia    Musculoskeletal chest pain 09/27/2016    Past Surgical History:  Procedure Laterality Date   ABDOMINAL HYSTERECTOMY     BREAST BIOPSY Bilateral 90's   benign   cataracts     COLON SURGERY     partial colectomy   intestinal blockage     NISSEN FUNDOPLICATION      MEDICATIONS:  Prior to Admission medications   Medication Sig Start Date End Date Taking? Authorizing Provider  acetaminophen (TYLENOL) 500 MG tablet Take 1,000 mg by mouth every 6 (six) hours as needed for mild pain or moderate pain.    [provider]  alendronate (FOSAMAX) 70 MG tablet Take 1 tablet by mouth once a week. Patient not taking: Reported on 02/01/2023    [provider]  ELIQUIS 2.5 MG TABS tablet Take 1 tablet (2.5 mg total) by mouth 2 (two) times daily. 05/28/23   Laurier Nancy, MD  fenofibrate 54 MG tablet TAKE 1 TABLET(54 MG) BY MOUTH DAILY 04/05/23   Margaretann Loveless, MD  fluticasone [facility]) 50 MCG/ACT nasal spray Place 1 spray into both nostrils daily. 03/08/23   Margaretann Loveless, MD  hydrOXYzine (ATARAX) 25 MG tablet TAKE 1  TABLET(25 MG) BY MOUTH EVERY 6 HOURS AS NEEDED 05/30/23   Margaretann Loveless, MD  Iron-FA-B Cmp-C-Biot-Probiotic (FUSION PLUS) CAPS Take 1 capsule by mouth daily. 03/20/23   Miki Kins, FNP  levothyroxine (LEVOXYL) 112 MCG tablet Take 1 tablet (112 mcg total) by mouth daily before breakfast. Patient not taking: Reported on 02/01/2023 10/05/22   Margaretann Loveless, MD  levothyroxine (SYNTHROID) 125 MCG tablet Take 125 mcg by mouth daily before breakfast.    [provider]  meclizine (ANTIVERT) 12.5 MG tablet Take 1 tablet (12.5 mg total) by mouth 2 (two) times daily as needed for dizziness. 03/08/23   Margaretann Loveless, MD  nitrofurantoin, macrocrystal-monohydrate, (MACROBID) 100 MG capsule Take 100 mg by mouth 2 (two) times daily.    [provider]  pantoprazole (PROTONIX) 40 MG tablet Take 1 tablet (40 mg total) by mouth daily. 10/04/22   Margaretann Loveless, MD  ranolazine (RANEXA) 500 MG 12 hr tablet TAKE 1 TABLET(500 MG) BY MOUTH TWICE DAILY 04/05/23   Margaretann Loveless, MD  rosuvastatin (CRESTOR) 20 MG tablet Take 1 tablet (20 mg total) by mouth daily. Patient not taking: Reported on 02/01/2023 10/04/22   Margaretann Loveless, MD  torsemide (DEMADEX) 10 MG tablet Take 1 tablet (10 mg total) by mouth 2 (two) times daily.  05/28/23 05/27/24  Laurier Nancy, MD    Physical Exam   Triage Vital Signs: ED Triage Vitals  Encounter Vitals Group     BP 06/03/23 2248 (!) 123/59     Systolic BP Percentile --      Diastolic BP Percentile --      Pulse --      Resp --      Temp 06/03/23 2248 97.6 F (36.4 C)     Temp Source 06/03/23 2248 Oral     SpO2 06/03/23 2248 97 %     Weight 06/03/23 2249 154 lb 5.2 oz (70 kg)     Height 06/03/23 2249 5\' 2"  (1.575 m)     Head Circumference --      Peak Flow --      Pain Score 06/03/23 2249 0     Pain Loc --      Pain Education --      Exclude from Growth Chart --     Most recent vital signs: Vitals:   06/04/23 0430 06/04/23 0530  BP: (!) 106/55 105/82   Pulse: 68 64  Resp: (!) 21 16  Temp:    SpO2: 94% 97%     CONSTITUTIONAL: Alert, responds appropriately to questions. Well-appearing; well-nourished; GCS 15, elderly HEAD: Normocephalic; hematoma to the right forehead EYES: Conjunctivae clear, PERRL, EOMI ENT: normal nose; no rhinorrhea; moist mucous membranes; pharynx without lesions noted; no dental injury; no septal hematoma, no epistaxis; no facial deformity or bony tenderness NECK: Supple, no midline spinal tenderness, step-off or deformity; trachea midline CARD: RRR; S1 and S2 appreciated; no murmurs, no clicks, no rubs, no gallops RESP: Normal chest excursion without splinting or tachypnea; breath sounds clear and equal bilaterally; no wheezes, no rhonchi, no rales; no hypoxia or respiratory distress CHEST:  chest wall stable, no crepitus or ecchymosis or deformity, nontender to palpation; no flail chest ABD/GI: Non-distended; soft, non-tender, no rebound, no guarding; no ecchymosis or other lesions noted PELVIS:  stable, nontender to palpation BACK:  The back appears normal; no midline spinal tenderness, step-off or deformity EXT: Normal ROM in all joints; chronic symmetric bilateral lower extremity edema, normal capillary refill; no cyanosis, no bony tenderness or bony deformity of patient's extremities, no joint effusions, compartments are soft, extremities are warm and well-perfused, no ecchymosis SKIN: Normal color for age and race; warm NEURO: No facial asymmetry, normal speech, moving all extremities equally  ED Results / Procedures / Treatments   LABS: (all labs ordered are listed, but only abnormal results are displayed) Labs Reviewed  CBC WITH DIFFERENTIAL/PLATELET - Abnormal; Notable for the following components:      Result Value   WBC 11.2 (*)    RBC 3.57 (*)    Hemoglobin 10.6 (*)    HCT 31.5 (*)    Monocytes Absolute 1.3 (*)    All other components within normal limits  BASIC METABOLIC PANEL - Abnormal;  Notable for the following components:   Sodium 131 (*)    Chloride 92 (*)    Glucose, Bld 106 (*)    BUN 25 (*)    Creatinine, Ser 1.69 (*)    GFR, Estimated 29 (*)    All other components within normal limits  PROTIME-INR - Abnormal; Notable for the following components:   Prothrombin Time 19.3 (*)    INR 1.6 (*)    All other components within normal limits     EKG:   RADIOLOGY: My personal review and interpretation of imaging:  CT shows subarachnoid, subdural with no midline shift.  I have personally reviewed all radiology reports. CT HEAD WO CONTRAST ( )  Result Date: 06/04/2023 CLINICAL DATA:  6 hour follow-up of ICH. EXAM: CT HEAD WITHOUT CONTRAST TECHNIQUE: Contiguous axial images were obtained from the base of the skull through the vertex without intravenous contrast. RADIATION DOSE REDUCTION: This exam was performed according to the departmental dose-optimization program which includes automated exposure control, adjustment of the mA and/or kV according to patient size and/or use of iterative reconstruction technique. COMPARISON:  Head CT from earlier today FINDINGS: Brain: Small volume subdural and subarachnoid hemorrhage along the right frontal convexity is non progressed. Subdural hemorrhage measures up to 3 mm in thickness. No mass effect or brain edema. No infarct, hydrocephalus, or masslike finding. Vascular: No hyperdense vessel or unexpected calcification. Skull: Negative for fracture Sinuses/Orbits: Negative for injury IMPRESSION: No progression of small volume subarachnoid and subdural hemorrhage at the right frontal convexity. Electronically Signed   By: Tiburcio Pea M.D.   On: 06/04/2023 06:37   CT HEAD WO CONTRAST ( )  Result Date: 06/04/2023 CLINICAL DATA:  Fall, struck the right side of her head, head and neck pain EXAM: CT HEAD WITHOUT CONTRAST CT CERVICAL SPINE WITHOUT CONTRAST TECHNIQUE: Multidetector CT imaging of the head and cervical spine was  performed following the standard protocol without intravenous contrast. Multiplanar CT image reconstructions of the cervical spine were also generated. RADIATION DOSE REDUCTION: This exam was performed according to the departmental dose-optimization program which includes automated exposure control, adjustment of the mA and/or kV according to patient size and/or use of iterative reconstruction technique. COMPARISON:  04/19/2023 CT head, 03/28/2018 CT head and cervical spine FINDINGS: CT HEAD FINDINGS Brain: Small amount of hyperdense subdural and subarachnoid hemorrhage along the right frontal lobe (series 2, image 18 and series 4, images 20 5-27). The subdural hemorrhage measuring up to 4 mm, and does not exert significant mass effect on the underlying right frontal lobe. No significant midline shift. No acute infarct, mass, or hydrocephalus. Vascular: No hyperdense vessel. Skull: Negative for fracture or focal lesion. Right frontal scalp hematoma (series 2, image 18). Sinuses/Orbits: No acute finding. Status post bilateral lens replacements. Other: Trace fluid in the right mastoid air cells. CT CERVICAL SPINE FINDINGS Alignment: No traumatic listhesis. Skull base and vertebrae: No acute fracture or suspicious osseous lesion. Soft tissues and spinal canal: No prevertebral fluid or swelling. No visible canal hematoma. Disc levels: Degenerative changes in the cervical spine.No high-grade spinal canal stenosis. Upper chest: No acute finding. IMPRESSION: 1. Small amount of acute subdural and subarachnoid hemorrhage along the right frontal lobe. No significant mass effect or midline shift. 2. Right frontal scalp hematoma. No evidence of acute calvarial fracture. 3. No acute cervical spine fracture or traumatic listhesis. These results were called by telephone at the time of interpretation on 06/04/2023 at 12:17 am to provider [facility] , who verbally acknowledged these results. Electronically Signed   By: Wiliam Ke M.D.   On: 06/04/2023 00:18   CT Cervical Spine Wo Contrast  Result Date: 06/04/2023 CLINICAL DATA:  Fall, struck the right side of her head, head and neck pain EXAM: CT HEAD WITHOUT CONTRAST CT CERVICAL SPINE WITHOUT CONTRAST TECHNIQUE: Multidetector CT imaging of the head and cervical spine was performed following the standard protocol without intravenous contrast. Multiplanar CT image reconstructions of the cervical spine were also generated. RADIATION DOSE REDUCTION: This exam was performed according to the departmental dose-optimization program which includes  automated exposure control, adjustment of the mA and/or kV according to patient size and/or use of iterative reconstruction technique. COMPARISON:  04/19/2023 CT head, 03/28/2018 CT head and cervical spine FINDINGS: CT HEAD FINDINGS Brain: Small amount of hyperdense subdural and subarachnoid hemorrhage along the right frontal lobe (series 2, image 18 and series 4, images 20 5-27). The subdural hemorrhage measuring up to 4 mm, and does not exert significant mass effect on the underlying right frontal lobe. No significant midline shift. No acute infarct, mass, or hydrocephalus. Vascular: No hyperdense vessel. Skull: Negative for fracture or focal lesion. Right frontal scalp hematoma (series 2, image 18). Sinuses/Orbits: No acute finding. Status post bilateral lens replacements. Other: Trace fluid in the right mastoid air cells. CT CERVICAL SPINE FINDINGS Alignment: No traumatic listhesis. Skull base and vertebrae: No acute fracture or suspicious osseous lesion. Soft tissues and spinal canal: No prevertebral fluid or swelling. No visible canal hematoma. Disc levels: Degenerative changes in the cervical spine.No high-grade spinal canal stenosis. Upper chest: No acute finding. IMPRESSION: 1. Small amount of acute subdural and subarachnoid hemorrhage along the right frontal lobe. No significant mass effect or midline shift. 2. Right frontal scalp  hematoma. No evidence of acute calvarial fracture. 3. No acute cervical spine fracture or traumatic listhesis. These results were called by telephone at the time of interpretation on 06/04/2023 at 12:17 am to provider [facility] , who verbally acknowledged these results. Electronically Signed   By: Wiliam Ke M.D.   On: 06/04/2023 00:18     PROCEDURES:  Critical Care performed: Yes, see critical care procedure note(s)   CRITICAL CARE Performed by: Baxter Hire Weston Kallman   Total critical care time: 40 minutes  Critical care time was exclusive of separately billable procedures and treating other patients.  Critical care was necessary to treat or prevent imminent or life-threatening deterioration.  Critical care was time spent personally by me on the following activities: development of treatment plan with patient and/or surrogate as well as nursing, discussions with consultants, evaluation of patient's response to treatment, examination of patient, obtaining history from patient or surrogate, ordering and performing treatments and interventions, ordering and review of laboratory studies, ordering and review of radiographic studies, pulse oximetry and re-evaluation of patient's condition.   [name] Kitchen1-3 Lead EKG Interpretation  Performed by: Levy Wellman, Layla Maw, DO Authorized by: Juanelle Trueheart, Layla Maw, DO     Interpretation: normal     ECG rate:  67   ECG rate assessment: normal     Rhythm: sinus rhythm     Ectopy: none     Conduction: normal       IMPRESSION / MDM / ASSESSMENT AND PLAN / ED COURSE  I reviewed the triage vital signs and the nursing notes.  Patient here with fall out of her power chair.  Did hit her head and is on Eliquis.  The patient is on the cardiac monitor to evaluate for evidence of arrhythmia and/or significant heart rate changes.   DIFFERENTIAL DIAGNOSIS (includes but not limited to):   Hematoma, skull fracture, intracranial hemorrhage, concussion  Patient's presentation is  most consistent with acute presentation with potential threat to life or bodily function.  PLAN: Will obtain CT head and cervical spine.  Patient appears to be at her neurologic baseline.  She has no complaints of pain.  Hemodynamically stable.   MEDICATIONS GIVEN IN ED: Medications  sodium chloride 0.9 % bolus 500 mL (0 mLs Intravenous Stopped 06/04/23 0409)     ED COURSE: CT head  and cervical spine reviewed and interpreted by myself and the radiologist and shows a small amount of acute subdural and subarachnoid hemorrhage along the right frontal lobe without mass effect or midline shift.  No cervical spine injury.  Discussed with Dr. Ernestine Mcmurray with neurosurgery who recommends repeat head CT at 6 AM.  He will also see patient in consultation in the morning.  Does not recommend reversal of her Eliquis at this time or seizure prophylaxis.  Will continue to closely monitor.  Will obtain blood work.   Labs unremarkable.  Leukocytosis of 11 which may be reactive.  Stable chronic kidney disease.  Stable anemia.  7:06 AM  Pt's repeat head CT is stable.  Dr. Katrinka Blazing recommends holding Eliquis for 4 weeks with outpatient follow-up in his office.  She does not need seizure prophylaxis.  He will see patient in the ER prior to discharge.  Recommended Tylenol at home as needed for pain.   At this time, I do not feel there is any life-threatening condition present. I reviewed all nursing notes, vitals, pertinent previous records.  All lab and urine results, EKGs, imaging ordered have been independently reviewed and interpreted by myself.  I reviewed all available radiology reports from any imaging ordered this visit.  Based on my assessment, I feel the patient is safe to be discharged home without further emergent workup and can continue workup as an outpatient as needed. Discussed all findings, treatment plan as well as usual and customary return precautions.  They verbalize understanding and are  comfortable with this plan.  Outpatient follow-up has been provided as needed.  All questions have been answered.  CONSULTS:  none   OUTSIDE RECORDS REVIEWED: Reviewed last pulmonology note in 2022.       FINAL CLINICAL IMPRESSION(S) / ED DIAGNOSES   Final diagnoses:  Fall, initial encounter  Injury of head, initial encounter  SDH (subdural hematoma) (HCC)  SAH (subarachnoid hemorrhage) (HCC)     Rx / DC Orders   ED Discharge Orders     None        Note:  This document was prepared using Dragon voice recognition software and may include unintentional dictation errors.   Oniyah Rohe, Layla Maw, DO 06/04/23 [phone]

## 2023-06-03 NOTE — ED Triage Notes (Signed)
Pt bib [facility] from Home. Pt had a mechanical fall from her lift chair. Pt denies LOC but did hit the R side of head. Pt denies any pain other than her R side of head. Pt is AxO, pupils equal and reactive. Pt is on Eliquis. Hx of HF 110/61 Hr 72 O2 98 on room air

## 2023-06-04 ENCOUNTER — Emergency Department: Payer: Medicare Other

## 2023-06-04 ENCOUNTER — Telehealth: Payer: Self-pay | Admitting: Neurosurgery

## 2023-06-04 ENCOUNTER — Telehealth: Payer: Self-pay | Admitting: Internal Medicine

## 2023-06-04 DIAGNOSIS — I609 Nontraumatic subarachnoid hemorrhage, unspecified: Secondary | ICD-10-CM | POA: Diagnosis not present

## 2023-06-04 DIAGNOSIS — S065XAA Traumatic subdural hemorrhage with loss of consciousness status unknown, initial encounter: Secondary | ICD-10-CM

## 2023-06-04 DIAGNOSIS — W07XXXA Fall from chair, initial encounter: Secondary | ICD-10-CM

## 2023-06-04 DIAGNOSIS — S0003XA Contusion of scalp, initial encounter: Secondary | ICD-10-CM

## 2023-06-04 DIAGNOSIS — S066XAA Traumatic subarachnoid hemorrhage with loss of consciousness status unknown, initial encounter: Secondary | ICD-10-CM

## 2023-06-04 DIAGNOSIS — S0990XA Unspecified injury of head, initial encounter: Secondary | ICD-10-CM

## 2023-06-04 LAB — CBC WITH DIFFERENTIAL/PLATELET
Abs Immature Granulocytes: 0.04 10*3/uL (ref 0.00–0.07)
Basophils Absolute: 0.1 10*3/uL (ref 0.0–0.1)
Basophils Relative: 1 %
Eosinophils Absolute: 0.2 10*3/uL (ref 0.0–0.5)
Eosinophils Relative: 1 %
HCT: 31.5 % — ABNORMAL LOW (ref 36.0–46.0)
Hemoglobin: 10.6 g/dL — ABNORMAL LOW (ref 12.0–15.0)
Immature Granulocytes: 0 %
Lymphocytes Relative: 29 %
Lymphs Abs: 3.3 10*3/uL (ref 0.7–4.0)
MCH: 29.7 pg (ref 26.0–34.0)
MCHC: 33.7 g/dL (ref 30.0–36.0)
MCV: 88.2 fL (ref 80.0–100.0)
Monocytes Absolute: 1.3 10*3/uL — ABNORMAL HIGH (ref 0.1–1.0)
Monocytes Relative: 11 %
Neutro Abs: 6.4 10*3/uL (ref 1.7–7.7)
Neutrophils Relative %: 58 %
Platelets: 304 10*3/uL (ref 150–400)
RBC: 3.57 MIL/uL — ABNORMAL LOW (ref 3.87–5.11)
RDW: 12.9 % (ref 11.5–15.5)
WBC: 11.2 10*3/uL — ABNORMAL HIGH (ref 4.0–10.5)
nRBC: 0 % (ref 0.0–0.2)

## 2023-06-04 LAB — BASIC METABOLIC PANEL
Anion gap: 13 (ref 5–15)
BUN: 25 mg/dL — ABNORMAL HIGH (ref 8–23)
CO2: 26 mmol/L (ref 22–32)
Calcium: 9.1 mg/dL (ref 8.9–10.3)
Chloride: 92 mmol/L — ABNORMAL LOW (ref 98–111)
Creatinine, Ser: 1.69 mg/dL — ABNORMAL HIGH (ref 0.44–1.00)
GFR, Estimated: 29 mL/min — ABNORMAL LOW (ref >60)
Glucose, Bld: 106 mg/dL — ABNORMAL HIGH (ref 70–99)
Potassium: 4.2 mmol/L (ref 3.5–5.1)
Sodium: 131 mmol/L — ABNORMAL LOW (ref 135–145)

## 2023-06-04 LAB — PROTIME-INR
INR: 1.6 — ABNORMAL HIGH (ref 0.8–1.2)
Prothrombin Time: 19.3 s — ABNORMAL HIGH (ref 11.4–15.2)

## 2023-06-04 MED ORDER — SODIUM CHLORIDE 0.9 % IV BOLUS (SEPSIS)
500.0000 mL | Freq: Once | INTRAVENOUS | Status: AC
  Administered 2023-06-04: 500 mL via INTRAVENOUS

## 2023-06-04 NOTE — Telephone Encounter (Signed)
Jasmine December, patient's daughter, left VM that patient had a fall and while at the ED they did a CT scan that showed a brain bleed. Should she d/c her Eliquis for now? Please advise.

## 2023-06-04 NOTE — Discharge Instructions (Signed)
Please hold your Eliquis until you have seen neurosurgery in 4 weeks and they tell you to resume it.  You do have small areas of subdural and subarachnoid hemorrhage in your right brain.  Please call neurosurgery for close follow-up.  You may take over-the-counter Tylenol 1000 mg every 6 hours as needed for pain.  If you develop severe headache, numbness, tingling, weakness, vision or speech changes, please return to the emergency department.

## 2023-06-04 NOTE — Consult Note (Signed)
Consulting Department: Emergency department  Primary Physician:  Margaretann Loveless, MD  Chief Complaint: Head trauma  History of Present Illness: 06/04/2023 Abigail Strong is a 89 y.o. female who presents with the chief complaint of head trauma.  She was in her usual state of health until she fell out of her lift chair.  She is unclear whether or not she lost consciousness.  Was brought to the emergency department due to a large scalp hematoma.  At that time she was GCS 15 with no focal deficits.  A head CT was performed which demonstrated a traumatic subarachnoid hemorrhage with a small subdural hematoma without significant mass effect.  She was on Eliquis.  No seizures.  No CSF leak.  No weakness numbness or tingling.  No active headaches.  Review of Systems:  A 10 point review of systems is negative, except for the pertinent positives and negatives detailed in the HPI.  Past Medical History: Past Medical History:  Diagnosis Date   Depression    GERD (gastroesophageal reflux disease)    Hyperlipidemia    Hyperlipidemia, unspecified 01/01/2014   Hypothyroidism    Insomnia    Musculoskeletal chest pain 09/27/2016    Past Surgical History: Past Surgical History:  Procedure Laterality Date   ABDOMINAL HYSTERECTOMY     BREAST BIOPSY Bilateral 90's   benign   cataracts     COLON SURGERY     partial colectomy   intestinal blockage     NISSEN FUNDOPLICATION      Allergies: Allergies as of 06/03/2023 - Unable to Assess 06/03/2023  Allergen Reaction Noted   Gabapentin Other (See Comments) 01/21/2020   Sulfa antibiotics Other (See Comments)    Sulfonamide derivatives      Medications: No current facility-administered medications for this encounter.  Current Outpatient Medications:    acetaminophen (TYLENOL) 500 MG tablet, Take 1,000 mg by mouth every 6 (six) hours as needed for mild pain or moderate pain., Disp: , Rfl:    Cholecalciferol 50 MCG (2000 UT) CAPS, Take 2,000  Units by mouth daily., Disp: , Rfl:    ELIQUIS 2.5 MG TABS tablet, Take 1 tablet (2.5 mg total) by mouth 2 (two) times daily., Disp: 60 tablet, Rfl: 6   fenofibrate 54 MG tablet, TAKE 1 TABLET(54 MG) BY MOUTH DAILY, Disp: 30 tablet, Rfl: 3   ferrous sulfate 324 MG TBEC, Take 324 mg by mouth daily with breakfast., Disp: , Rfl:    hydrOXYzine (ATARAX) 25 MG tablet, TAKE 1 TABLET(25 MG) BY MOUTH EVERY 6 HOURS AS NEEDED, Disp: 90 tablet, Rfl: 1   Iron-FA-B Cmp-C-Biot-Probiotic (FUSION PLUS) CAPS, Take 1 capsule by mouth daily., Disp: 30 capsule, Rfl: 5   levothyroxine (SYNTHROID) 125 MCG tablet, Take 125 mcg by mouth daily before breakfast., Disp: , Rfl:    meclizine (ANTIVERT) 12.5 MG tablet, Take 1 tablet (12.5 mg total) by mouth 2 (two) times daily as needed for dizziness., Disp: 60 tablet, Rfl: 0   pantoprazole (PROTONIX) 40 MG tablet, Take 1 tablet (40 mg total) by mouth daily., Disp: 30 tablet, Rfl: 4   ranolazine (RANEXA) 500 MG 12 hr tablet, TAKE 1 TABLET(500 MG) BY MOUTH TWICE DAILY, Disp: 90 tablet, Rfl: 1   torsemide (DEMADEX) 10 MG tablet, Take 1 tablet (10 mg total) by mouth 2 (two) times daily., Disp: 30 tablet, Rfl: 11   alendronate (FOSAMAX) 70 MG tablet, Take 1 tablet by mouth once a week. (Patient not taking: Reported on 02/01/2023), Disp: , Rfl:  fluticasone (FLONASE) 50 MCG/ACT nasal spray, Place 1 spray into both nostrils daily. (Patient not taking: Reported on 06/04/2023), Disp: 16 g, Rfl: 2   levothyroxine (LEVOXYL) 112 MCG tablet, Take 1 tablet (112 mcg total) by mouth daily before breakfast. (Patient not taking: Reported on 02/01/2023), Disp: 30 tablet, Rfl: 2   rosuvastatin (CRESTOR) 20 MG tablet, Take 1 tablet (20 mg total) by mouth daily. (Patient not taking: Reported on 02/01/2023), Disp: 90 tablet, Rfl: 1   Social History: Social History   Tobacco Use   Smoking status: Never   Smokeless tobacco: Never  Substance Use Topics   Alcohol use: No   Drug use: No     Family Medical History: Family History  Problem Relation Age of Onset   Cervical cancer Mother    Breast cancer Maternal Aunt     Physical Examination: Vitals:   06/04/23 0430 06/04/23 0530  BP: (!) 106/55 105/82  Pulse: 68 64  Resp: (!) 21 16  Temp:    SpO2: 94% 97%     General: Patient is well developed, well nourished, calm, collected, and in no apparent distress.  NEUROLOGICAL:  General: In no acute distress.   Awake, alert, oriented to person, place and situation..  Pupils equal round and reactive to light, postsurgical.  Full Facial tone is symmetric.  Tongue protrusion is midline.  Bilateral upper extremities are full strength proximally and distally.  There is no pronator drift.  Language is conversant.  GCS:15   Bilateral upper and lower extremity sensation is intact to light touch.  Imaging: Narrative & Impression  CLINICAL DATA:  Fall, struck the right side of her head, head and neck pain   EXAM: CT HEAD WITHOUT CONTRAST   CT CERVICAL SPINE WITHOUT CONTRAST   TECHNIQUE: Multidetector CT imaging of the head and cervical spine was performed following the standard protocol without intravenous contrast. Multiplanar CT image reconstructions of the cervical spine were also generated.   RADIATION DOSE REDUCTION: This exam was performed according to the departmental dose-optimization program which includes automated exposure control, adjustment of the mA and/or kV according to patient size and/or use of iterative reconstruction technique.   COMPARISON:  04/19/2023 CT head, 03/28/2018 CT head and cervical spine   FINDINGS: CT HEAD FINDINGS   Brain: Small amount of hyperdense subdural and subarachnoid hemorrhage along the right frontal lobe (series 2, image 18 and series 4, images 20 5-27). The subdural hemorrhage measuring up to 4 mm, and does not exert significant mass effect on the underlying right frontal lobe. No significant midline shift. No  acute infarct, mass, or hydrocephalus.   Vascular: No hyperdense vessel.   Skull: Negative for fracture or focal lesion. Right frontal scalp hematoma (series 2, image 18).   Sinuses/Orbits: No acute finding. Status post bilateral lens replacements.   Other: Trace fluid in the right mastoid air cells.   CT CERVICAL SPINE FINDINGS   Alignment: No traumatic listhesis.   Skull base and vertebrae: No acute fracture or suspicious osseous lesion.   Soft tissues and spinal canal: No prevertebral fluid or swelling. No visible canal hematoma.   Disc levels: Degenerative changes in the cervical spine.No high-grade spinal canal stenosis.   Upper chest: No acute finding.   IMPRESSION: 1. Small amount of acute subdural and subarachnoid hemorrhage along the right frontal lobe. No significant mass effect or midline shift. 2. Right frontal scalp hematoma. No evidence of acute calvarial fracture. 3. No acute cervical spine fracture or traumatic listhesis.  These results were called by telephone at the time of interpretation on 06/04/2023 at 12:17 am to provider [facility] , who verbally acknowledged these results.     Electronically Signed   By: Wiliam Ke M.D.   On: 06/04/2023 00:18     Narrative & Impression  CLINICAL DATA:  6 hour follow-up of ICH.   EXAM: CT HEAD WITHOUT CONTRAST   TECHNIQUE: Contiguous axial images were obtained from the base of the skull through the vertex without intravenous contrast.   RADIATION DOSE REDUCTION: This exam was performed according to the departmental dose-optimization program which includes automated exposure control, adjustment of the mA and/or kV according to patient size and/or use of iterative reconstruction technique.   COMPARISON:  Head CT from earlier today   FINDINGS: Brain: Small volume subdural and subarachnoid hemorrhage along the right frontal convexity is non progressed. Subdural hemorrhage measures up to 3 mm  in thickness. No mass effect or brain edema. No infarct, hydrocephalus, or masslike finding.   Vascular: No hyperdense vessel or unexpected calcification.   Skull: Negative for fracture   Sinuses/Orbits: Negative for injury   IMPRESSION: No progression of small volume subarachnoid and subdural hemorrhage at the right frontal convexity.     Electronically Signed   By: Tiburcio Pea M.D.   On: 06/04/2023 06:37       I have personally reviewed the images and agree with the above interpretation.  Labs:    Latest Ref Rng & Units 06/04/2023   12:34 AM 04/19/2023   12:07 PM 03/20/2023   10:13 AM  CBC  WBC 4.0 - 10.5 K/uL 11.2  7.2  7.9   Hemoglobin 12.0 - 15.0 g/dL 16.1  09.6  04.5   Hematocrit 36.0 - 46.0 % 31.5  32.8  32.1   Platelets 150 - 400 K/uL 304  244  292        Assessment and Plan: Ms. Trager is a pleasant 89 y.o. female with a fall from her lift chair striking her head and causing a large scalp hematoma.  She is unclear whether or not she lost consciousness.  She denies any headaches.  Denies any weakness numbness or tingling.  Denies any seizures.  No evidence of CSF leak.  On physical exam she is GCS 15 with no evidence of pronator drift.  She has no headaches.  Her pupils are reactive, they are slightly irregular secondary to surgery.  She is alert to person, place, and situation.  Her initial head CT demonstrated a right sided traumatic subarachnoid hemorrhage with a small subdural hematoma, she had a repeat head CT 6 hours later which demonstrated stability of the bleed.  We did not reverse her Eliquis given the size of the bleed and her associated comorbidities.  Given her age we will avoid antiepileptic medications as it can cause altered mentation and psychiatric disturbances.  Would like to see her again in 1 month with follow-up head CT.  Would prefer to hold Eliquis in the interim.  Will send a message to her cardiologist regarding the fall.  Okay for DC from  neurosurgery standpoint.   Lovenia Kim, MD/MSCR Dept. of Neurosurgery

## 2023-06-04 NOTE — ED Notes (Signed)
[facility]  CALLED  TO TRANSPORT PT  HOME

## 2023-06-04 NOTE — Telephone Encounter (Signed)
ED on 06/03/23 Subdural hematoma 4 wks w/ repeat head ct per Dr.Smith.  Can someone please place the CT order I will then contact the patient.

## 2023-06-04 NOTE — ED Notes (Signed)
Per pt she does not have a way of getting back home. Secretary notified of need for transport back home. Transport placed.

## 2023-06-06 ENCOUNTER — Ambulatory Visit (INDEPENDENT_AMBULATORY_CARE_PROVIDER_SITE_OTHER): Payer: Medicare Other | Admitting: Internal Medicine

## 2023-06-06 ENCOUNTER — Encounter: Payer: Self-pay | Admitting: Internal Medicine

## 2023-06-06 VITALS — BP 96/70 | HR 80 | Ht 65.0 in | Wt 140.0 lb

## 2023-06-06 DIAGNOSIS — Z0130 Encounter for examination of blood pressure without abnormal findings: Secondary | ICD-10-CM

## 2023-06-06 DIAGNOSIS — S066X9A Traumatic subarachnoid hemorrhage with loss of consciousness of unspecified duration, initial encounter: Secondary | ICD-10-CM | POA: Diagnosis not present

## 2023-06-06 DIAGNOSIS — E038 Other specified hypothyroidism: Secondary | ICD-10-CM | POA: Diagnosis not present

## 2023-06-06 DIAGNOSIS — E782 Mixed hyperlipidemia: Secondary | ICD-10-CM

## 2023-06-06 DIAGNOSIS — Z9181 History of falling: Secondary | ICD-10-CM | POA: Diagnosis not present

## 2023-06-06 DIAGNOSIS — S065XAA Traumatic subdural hemorrhage with loss of consciousness status unknown, initial encounter: Secondary | ICD-10-CM

## 2023-06-06 DIAGNOSIS — S066X0D Traumatic subarachnoid hemorrhage without loss of consciousness, subsequent encounter: Secondary | ICD-10-CM

## 2023-06-06 MED ORDER — LEVOTHYROXINE SODIUM 112 MCG PO TABS
112.0000 ug | ORAL_TABLET | Freq: Every day | ORAL | 2 refills | Status: DC
Start: 2023-06-06 — End: 2023-06-22

## 2023-06-06 NOTE — Progress Notes (Signed)
Patient comes in  Established Patient Office Visit  Subjective:  Patient ID: Abigail Strong, female    DOB: Feb 24, 1930  Age: 89 y.o. MRN: [id number]  Chief Complaint  Patient presents with   Follow-up    3 month follow up    Patient is here for follow-up of her hospital visit on 06/03/2023 after a fall.  Patient fell out of her lift chair and hit the right side of her head.  Initial CT scan showed small volume subdural and subarachnoid hemorrhage.  Patient was on Eliquis.  She remained awake and alert, did not have any seizure activity and did not have any neurological deficits.  After observation for 12 hours she had a repeat CT scan which did not show progression of the subdural/subarachnoid bleed.  Patient did not need to be on seizure precautions or medications.  She was discharged with instructions to follow-up with her neurosurgeon as outpatient and also to remain off of Eliquis for next 4 weeks. Today patient states she is feeling well.  Has mild soreness over her right side of the head and  still has some bruising. No new deficits, and no seizure activity. She has an appointment to see the neurosurgeon next month and already has a repeat CT head scheduled.    No other concerns at this time.   Past Medical History:  Diagnosis Date   Depression    GERD (gastroesophageal reflux disease)    Hyperlipidemia    Hyperlipidemia, unspecified 01/01/2014   Hypothyroidism    Insomnia    Musculoskeletal chest pain 09/27/2016    Past Surgical History:  Procedure Laterality Date   ABDOMINAL HYSTERECTOMY     BREAST BIOPSY Bilateral 90's   benign   cataracts     COLON SURGERY     partial colectomy   intestinal blockage     NISSEN FUNDOPLICATION      Social History   Socioeconomic History   Marital status: Married    Spouse name: Not on file   Number of children: Not on file   Years of education: Not on file   Highest education level: Not on file  Occupational History   Not  on file  Tobacco Use   Smoking status: Never   Smokeless tobacco: Never  Substance and Sexual Activity   Alcohol use: No   Drug use: No   Sexual activity: Not Currently    Birth control/protection: Post-menopausal  Other Topics Concern   Not on file  Social History Narrative   Not on file   Social Determinants of Health   Financial Resource Strain: High Risk (05/04/2021)   Overall Financial Resource Strain (CARDIA)    Difficulty of Paying Living Expenses: Very hard  Food Insecurity: No Food Insecurity (05/04/2021)   Hunger Vital Sign    Worried About Running Out of Food in the Last Year: Never true    Ran Out of Food in the Last Year: Never true  Transportation Needs: No Transportation Needs (05/04/2021)   PRAPARE - Administrator, Civil Service (Medical): No    Lack of Transportation (Non-Medical): No  Physical Activity: Inactive (05/04/2021)   Exercise Vital Sign    Days of Exercise per Week: 0 days    Minutes of Exercise per Session: 0 min  Stress: No Stress Concern Present (05/04/2021)   [employer] of Occupational Health - Occupational Stress Questionnaire    Feeling of Stress : Only a little  Social Connections: Moderately Integrated (05/04/2021)  Social Advertising account executive [NHANES]    Frequency of Communication with Friends and Family: More than three times a week    Frequency of Social Gatherings with Friends and Family: More than three times a week    Attends Religious Services: 1 to 4 times per year    Active Member of [employer] or Organizations: Yes    Attends Banker Meetings: 1 to 4 times per year    Marital Status: Widowed  Intimate Partner Violence: Not At Risk (05/04/2021)   Humiliation, Afraid, Rape, and Kick questionnaire    Fear of Current or Ex-Partner: No    Emotionally Abused: No    Physically Abused: No    Sexually Abused: No    Family History  Problem Relation Age of Onset   Cervical cancer Mother    Breast  cancer Maternal Aunt     Allergies  Allergen Reactions   Gabapentin Other (See Comments)    Dizziness    Sulfa Antibiotics Other (See Comments)   Sulfonamide Derivatives     Review of Systems  Constitutional: Negative.  Negative for chills, diaphoresis, fever, malaise/fatigue and weight loss.  HENT: Negative.    Eyes: Negative.  Negative for blurred vision.  Respiratory: Negative.  Negative for cough and shortness of breath.   Cardiovascular: Negative.  Negative for chest pain, palpitations and leg swelling.  Gastrointestinal: Negative.  Negative for abdominal pain, constipation, diarrhea, heartburn, nausea and vomiting.  Genitourinary: Negative.  Negative for dysuria and flank pain.  Musculoskeletal: Negative.  Negative for joint pain and myalgias.  Skin: Negative.   Neurological:  Positive for headaches. Negative for dizziness, sensory change, speech change, focal weakness, seizures, loss of consciousness and weakness.  Endo/Heme/Allergies: Negative.   Psychiatric/Behavioral: Negative.  Negative for depression and suicidal ideas. The patient is not nervous/anxious.        Objective:   BP 96/70   Pulse 80   Ht 5\' 5"  (1.651 m)   Wt 140 lb (63.5 kg)   SpO2 98%   BMI 23.30 kg/m   Vitals:   06/06/23 1432  BP: 96/70  Pulse: 80  Height: 5\' 5"  (1.651 m)  Weight: 140 lb (63.5 kg)  SpO2: 98%  BMI (Calculated): 23.3    Physical Exam Vitals and nursing note reviewed.  Constitutional:      Appearance: Normal appearance.  HENT:     Head: Normocephalic and atraumatic.     Nose: Nose normal.     Mouth/Throat:     Mouth: Mucous membranes are moist.     Pharynx: Oropharynx is clear.  Eyes:     Conjunctiva/sclera: Conjunctivae normal.     Pupils: Pupils are equal, round, and reactive to light.  Cardiovascular:     Rate and Rhythm: Normal rate and regular rhythm.     Pulses: Normal pulses.     Heart sounds: Normal heart sounds. No murmur heard. Pulmonary:     Effort:  Pulmonary effort is normal.     Breath sounds: Normal breath sounds. No wheezing.  Abdominal:     General: Bowel sounds are normal.     Palpations: Abdomen is soft.     Tenderness: There is no abdominal tenderness. There is no right CVA tenderness or left CVA tenderness.  Musculoskeletal:        General: Normal range of motion.     Cervical back: Normal range of motion.     Right lower leg: No edema.     Left lower leg: No  edema.  Skin:    General: Skin is warm and dry.     Findings: Bruising present.  Neurological:     General: No focal deficit present.     Mental Status: She is alert and oriented to person, place, and time.  Psychiatric:        Mood and Affect: Mood normal.        Behavior: Behavior normal.         Assessment & Plan:  Patient advised to stay off Eliquis until November 14. Seed with a follow-up CT scan and neurosurgical evaluation as scheduled. Problem List Items Addressed This Visit     Mixed hyperlipidemia   Relevant Orders   Lipid Panel w/o Chol/HDL Ratio   Hypothyroidism, unspecified   Relevant Medications   levothyroxine (LEVOXYL) 112 MCG tablet   Other Relevant Orders   TSH+T4F+T3Free   Other Visit Diagnoses     Subarachnoid hemorrhage following injury, no loss of consciousness, subsequent encounter    -  Primary   Subdural hematoma (HCC)       History of fall           Return in about 2 months (around 08/06/2023).   Total time spent: 30 minutes  Margaretann Loveless, MD  06/06/2023   This document may have been prepared by [facility] Voice Recognition software and as such may include unintentional dictation errors.

## 2023-06-07 NOTE — Telephone Encounter (Signed)
06/28/2023 CT 07/10/2023 Dr. Katrinka Blazing

## 2023-06-11 ENCOUNTER — Other Ambulatory Visit: Payer: Self-pay | Admitting: Internal Medicine

## 2023-06-14 ENCOUNTER — Other Ambulatory Visit: Payer: Medicare Other

## 2023-06-14 ENCOUNTER — Ambulatory Visit (INDEPENDENT_AMBULATORY_CARE_PROVIDER_SITE_OTHER): Payer: Medicare Other | Admitting: Cardiology

## 2023-06-14 DIAGNOSIS — Z23 Encounter for immunization: Secondary | ICD-10-CM | POA: Diagnosis not present

## 2023-06-15 LAB — TSH+T4F+T3FREE
Free T4: 1.79 ng/dL — ABNORMAL HIGH (ref 0.82–1.77)
T3, Free: 2.1 pg/mL (ref 2.0–4.4)
TSH: 0.291 u[IU]/mL — ABNORMAL LOW (ref 0.450–4.500)

## 2023-06-15 LAB — LIPID PANEL W/O CHOL/HDL RATIO
Cholesterol, Total: 226 mg/dL — ABNORMAL HIGH (ref 100–199)
HDL: 78 mg/dL (ref >39)
LDL Chol Calc (NIH): 128 mg/dL — ABNORMAL HIGH (ref 0–99)
Triglycerides: 118 mg/dL (ref 0–149)
VLDL Cholesterol Cal: 20 mg/dL (ref 5–40)

## 2023-06-17 ENCOUNTER — Telehealth: Payer: Self-pay | Admitting: Internal Medicine

## 2023-06-17 DIAGNOSIS — E038 Other specified hypothyroidism: Secondary | ICD-10-CM

## 2023-06-17 NOTE — Telephone Encounter (Signed)
Patient's daughter called in inquiring about lab results from 10/25. Please advise.

## 2023-06-18 NOTE — Telephone Encounter (Signed)
Patient called in inquiring about her lab results and wants to know what we need to do about her thryroid.

## 2023-06-22 MED ORDER — LEVOTHYROXINE SODIUM 112 MCG PO TABS
112.0000 ug | ORAL_TABLET | Freq: Every day | ORAL | 0 refills | Status: DC
Start: 2023-06-22 — End: 2023-09-27

## 2023-06-22 NOTE — Telephone Encounter (Signed)
Spoke to patient' daughter, she doesn't think that the pt has been taking her thyroid meds.   I believe that her labs were drawn on the higher dose, will decrease the dose to 112 and have her recheck the level in a few weeks.   Daughter verbalized understanding of the plan.

## 2023-06-26 ENCOUNTER — Telehealth: Payer: Self-pay | Admitting: Internal Medicine

## 2023-06-26 ENCOUNTER — Other Ambulatory Visit: Payer: Self-pay | Admitting: Internal Medicine

## 2023-06-26 DIAGNOSIS — I2510 Atherosclerotic heart disease of native coronary artery without angina pectoris: Secondary | ICD-10-CM

## 2023-06-26 NOTE — Telephone Encounter (Signed)
Patient left VM needing to find out what this med is that was sent to her. Per Marchelle Folks, it is her thyroid medication and she spoke with the patient's daughter about it over this past weekend.

## 2023-06-28 ENCOUNTER — Ambulatory Visit: Payer: Medicare Other

## 2023-07-02 ENCOUNTER — Other Ambulatory Visit: Payer: Self-pay | Admitting: Internal Medicine

## 2023-07-02 ENCOUNTER — Telehealth: Payer: Self-pay

## 2023-07-02 DIAGNOSIS — I2510 Atherosclerotic heart disease of native coronary artery without angina pectoris: Secondary | ICD-10-CM

## 2023-07-02 MED ORDER — RANOLAZINE ER 500 MG PO TB12
500.0000 mg | ORAL_TABLET | Freq: Two times a day (BID) | ORAL | 1 refills | Status: DC
Start: 2023-07-02 — End: 2024-04-15

## 2023-07-02 NOTE — Telephone Encounter (Signed)
Pt. Called wanting refill on Ranolasine 500 mg  90 days [facility]

## 2023-07-02 NOTE — Telephone Encounter (Signed)
She called to inform us that she has been off her Eliquis for 4 weeks and wants to know when to go back on it. Please inform.

## 2023-07-03 NOTE — Progress Notes (Deleted)
Referring Physician:  Margaretann Loveless, MD [address] [place],  [place] [address]  Primary Physician:  Margaretann Loveless, MD  History of Present Illness: 07/03/2023 [name] is here today with a chief complaint of ***  Subdural Hematoma    Past Surgery: ***  [name] has ***no symptoms of cervical myelopathy.  The symptoms are causing a significant impact on the patient's life.   I have utilized the care everywhere function in epic to review the outside records available from external health systems.  Review of Systems:  A 10 point review of systems is negative, except for the pertinent positives and negatives detailed in the HPI.  Past Medical History: Past Medical History:  Diagnosis Date   Depression    GERD (gastroesophageal reflux disease)    Hyperlipidemia    Hyperlipidemia, unspecified 01/01/2014   Hypothyroidism    Insomnia    Musculoskeletal chest pain 09/27/2016    Past Surgical History: Past Surgical History:  Procedure Laterality Date   ABDOMINAL HYSTERECTOMY     BREAST BIOPSY Bilateral 90's   benign   cataracts     COLON SURGERY     partial colectomy   intestinal blockage     NISSEN FUNDOPLICATION      Allergies: Allergies as of 07/10/2023 - Review Complete 06/06/2023  Allergen Reaction Noted   Gabapentin Other (See Comments) 01/21/2020   Sulfa antibiotics Other (See Comments)    Sulfonamide derivatives      Medications:  Current Outpatient Medications:    acetaminophen (TYLENOL) 500 MG tablet, Take 1,000 mg by mouth every 6 (six) hours as needed for mild pain or moderate pain., Disp: , Rfl:    alendronate (FOSAMAX) 70 MG tablet, Take 1 tablet by mouth once a week. (Patient not taking: Reported on 02/01/2023), Disp: , Rfl:    Cholecalciferol 50 MCG (2000 UT) CAPS, Take 2,000 Units by mouth daily., Disp: , Rfl:    ELIQUIS 2.5 MG TABS tablet, Take 1 tablet (2.5 mg total) by mouth 2 (two) times daily. (Patient not taking:  Reported on 06/06/2023), Disp: 60 tablet, Rfl: 6   fenofibrate 54 MG tablet, TAKE 1 TABLET(54 MG) BY MOUTH DAILY, Disp: 30 tablet, Rfl: 3   ferrous sulfate 324 MG TBEC, Take 324 mg by mouth daily with breakfast., Disp: , Rfl:    fluticasone (FLONASE) 50 MCG/ACT nasal spray, Place 1 spray into both nostrils daily. (Patient not taking: Reported on 06/04/2023), Disp: 16 g, Rfl: 2   hydrOXYzine (ATARAX) 25 MG tablet, TAKE 1 TABLET(25 MG) BY MOUTH EVERY 6 HOURS AS NEEDED, Disp: 90 tablet, Rfl: 1   Iron-FA-B Cmp-C-Biot-Probiotic (FUSION PLUS) CAPS, Take 1 capsule by mouth daily., Disp: 30 capsule, Rfl: 5   levothyroxine (LEVOXYL) 112 MCG tablet, Take 1 tablet (112 mcg total) by mouth daily before breakfast., Disp: 90 tablet, Rfl: 0   meclizine (ANTIVERT) 12.5 MG tablet, Take 1 tablet (12.5 mg total) by mouth 2 (two) times daily as needed for dizziness. (Patient not taking: Reported on 06/06/2023), Disp: 60 tablet, Rfl: 0   pantoprazole (PROTONIX) 40 MG tablet, Take 1 tablet (40 mg total) by mouth daily., Disp: 30 tablet, Rfl: 4   ranolazine (RANEXA) 500 MG 12 hr tablet, Take 1 tablet (500 mg total) by mouth 2 (two) times daily., Disp: 180 tablet, Rfl: 1   rosuvastatin (CRESTOR) 20 MG tablet, Take 1 tablet (20 mg total) by mouth daily., Disp: 90 tablet, Rfl: 1   torsemide (DEMADEX) 10 MG  tablet, Take 1 tablet (10 mg total) by mouth 2 (two) times daily., Disp: 30 tablet, Rfl: 11  Social History: Social History   Tobacco Use   Smoking status: Never   Smokeless tobacco: Never  Substance Use Topics   Alcohol use: No   Drug use: No    Family Medical History: Family History  Problem Relation Age of Onset   Cervical cancer Mother    Breast cancer Maternal Aunt     Physical Examination: There were no vitals filed for this visit.  General: Patient is in no apparent distress. Attention to examination is appropriate.  Neck:   Supple.  Full range of motion.  Respiratory: Patient is breathing  without any difficulty.   NEUROLOGICAL:     Awake, alert, oriented to person, place, and time.  Speech is clear and fluent.   Cranial Nerves: Pupils equal round and reactive to light.  Facial tone is symmetric.  Facial sensation is symmetric. Shoulder shrug is symmetric. Tongue protrusion is midline.    Strength: Side Biceps Triceps Deltoid Interossei Grip Wrist Ext. Wrist Flex.  R 5 5 5 5 5 5 5   L 5 5 5 5 5 5 5    Side Iliopsoas Quads Hamstring PF DF EHL  R 5 5 5 5 5 5   L 5 5 5 5 5 5    Reflexes are ***2+ and symmetric at the biceps, triceps, brachioradialis, patella and achilles.   Hoffman's is absent. Clonus is absent  Bilateral upper and lower extremity sensation is intact to light touch ***.     No evidence of dysmetria noted.  Gait is normal.    Imaging: *** I have personally reviewed the images and agree with the above interpretation.  Medical Decision Making/Assessment and Plan: Ms. [name] is a pleasant 89 y.o. female with ***  There are no diagnoses linked to this encounter.   Thank you for involving me in the care of this patient.    Lovenia Kim MD/MSCR Neurosurgery

## 2023-07-04 ENCOUNTER — Ambulatory Visit: Payer: Medicare Other | Admitting: Cardiovascular Disease

## 2023-07-04 ENCOUNTER — Telehealth: Payer: Self-pay

## 2023-07-04 NOTE — Telephone Encounter (Signed)
Patient wants to discuss medicine.

## 2023-07-10 ENCOUNTER — Ambulatory Visit: Payer: Medicare Other | Admitting: Neurosurgery

## 2023-07-11 ENCOUNTER — Other Ambulatory Visit: Payer: Self-pay | Admitting: Internal Medicine

## 2023-07-20 ENCOUNTER — Other Ambulatory Visit: Payer: Self-pay | Admitting: Internal Medicine

## 2023-08-08 ENCOUNTER — Ambulatory Visit: Payer: Medicare Other | Admitting: Internal Medicine

## 2023-08-10 ENCOUNTER — Other Ambulatory Visit: Payer: Self-pay | Admitting: Internal Medicine

## 2023-08-30 ENCOUNTER — Ambulatory Visit
Admission: RE | Admit: 2023-08-30 | Discharge: 2023-08-30 | Disposition: A | Discharge status: Home / Self Care | Payer: Medicare Other | Source: Ambulatory Visit | Attending: Neurosurgery | Admitting: Neurosurgery

## 2023-08-30 DIAGNOSIS — S065XAA Traumatic subdural hemorrhage with loss of consciousness status unknown, initial encounter: Secondary | ICD-10-CM | POA: Insufficient documentation

## 2023-09-27 ENCOUNTER — Telehealth: Payer: Self-pay | Admitting: Internal Medicine

## 2023-09-27 DIAGNOSIS — E038 Other specified hypothyroidism: Secondary | ICD-10-CM

## 2023-09-27 MED ORDER — LEVOTHYROXINE SODIUM 112 MCG PO TABS: 112.0000 ug | ORAL_TABLET | Freq: Every day | ORAL | 0 refills | Status: DC

## 2023-09-27 NOTE — Telephone Encounter (Signed)
 Patient called in and states she is drinking a lot of water like she was instructed to and it is making her sick on her stomach and lightheaded. She was instructed to drink a lot of water because she stays dehydrated. She isn't resting well at night due to getting up to use the restroom. She wants to know what she can do in place of drinking so much water? She drinks so much water to the point that she feels like she is going to vomit.

## 2023-09-29 ENCOUNTER — Other Ambulatory Visit: Payer: Self-pay | Admitting: Internal Medicine

## 2023-09-29 DIAGNOSIS — F32A Depression, unspecified: Secondary | ICD-10-CM

## 2023-10-21 ENCOUNTER — Telehealth: Payer: Self-pay | Admitting: Internal Medicine

## 2023-10-21 DIAGNOSIS — M170 Bilateral primary osteoarthritis of knee: Secondary | ICD-10-CM

## 2023-10-21 NOTE — Telephone Encounter (Signed)
 Patient called in wanting a nurse to come in home to do blood work and urine. She is homebound and cannot get down the step at her house. She said she has urinary frequency and hasn't had blood work in a long time. She said this isn't an urgent need and can wait until Dr. Welton Flakes returns later this week.   Do you mind [place] seeing if we can get her some nursing home health if they'll do this type of stuff?

## 2023-10-26 ENCOUNTER — Emergency Department

## 2023-10-26 ENCOUNTER — Observation Stay
Admission: EM | Admit: 2023-10-26 | Discharge: 2023-10-28 | Disposition: A | Discharge status: Home Health | Attending: Internal Medicine | Admitting: Internal Medicine

## 2023-10-26 ENCOUNTER — Other Ambulatory Visit: Payer: Self-pay

## 2023-10-26 ENCOUNTER — Encounter: Payer: Self-pay | Admitting: Internal Medicine

## 2023-10-26 DIAGNOSIS — R4182 Altered mental status, unspecified: Secondary | ICD-10-CM | POA: Diagnosis not present

## 2023-10-26 DIAGNOSIS — R410 Disorientation, unspecified: Secondary | ICD-10-CM

## 2023-10-26 DIAGNOSIS — N390 Urinary tract infection, site not specified: Principal | ICD-10-CM | POA: Insufficient documentation

## 2023-10-26 DIAGNOSIS — R413 Other amnesia: Secondary | ICD-10-CM | POA: Diagnosis not present

## 2023-10-26 DIAGNOSIS — R8290 Unspecified abnormal findings in urine: Secondary | ICD-10-CM | POA: Insufficient documentation

## 2023-10-26 DIAGNOSIS — R531 Weakness: Secondary | ICD-10-CM | POA: Diagnosis present

## 2023-10-26 DIAGNOSIS — R053 Chronic cough: Secondary | ICD-10-CM | POA: Insufficient documentation

## 2023-10-26 DIAGNOSIS — I5032 Chronic diastolic (congestive) heart failure: Secondary | ICD-10-CM | POA: Insufficient documentation

## 2023-10-26 DIAGNOSIS — I480 Paroxysmal atrial fibrillation: Secondary | ICD-10-CM | POA: Diagnosis not present

## 2023-10-26 DIAGNOSIS — Z7901 Long term (current) use of anticoagulants: Secondary | ICD-10-CM | POA: Diagnosis not present

## 2023-10-26 DIAGNOSIS — J441 Chronic obstructive pulmonary disease with (acute) exacerbation: Secondary | ICD-10-CM | POA: Insufficient documentation

## 2023-10-26 DIAGNOSIS — N3000 Acute cystitis without hematuria: Secondary | ICD-10-CM

## 2023-10-26 DIAGNOSIS — R296 Repeated falls: Secondary | ICD-10-CM | POA: Insufficient documentation

## 2023-10-26 DIAGNOSIS — Z79899 Other long term (current) drug therapy: Secondary | ICD-10-CM | POA: Diagnosis not present

## 2023-10-26 DIAGNOSIS — I110 Hypertensive heart disease with heart failure: Secondary | ICD-10-CM | POA: Insufficient documentation

## 2023-10-26 DIAGNOSIS — F32A Depression, unspecified: Secondary | ICD-10-CM | POA: Insufficient documentation

## 2023-10-26 DIAGNOSIS — E785 Hyperlipidemia, unspecified: Secondary | ICD-10-CM | POA: Diagnosis not present

## 2023-10-26 LAB — CBC WITH DIFFERENTIAL/PLATELET
Abs Immature Granulocytes: 0.04 10*3/uL (ref 0.00–0.07)
Basophils Absolute: 0.1 10*3/uL (ref 0.0–0.1)
Basophils Relative: 1 %
Eosinophils Absolute: 0.2 10*3/uL (ref 0.0–0.5)
Eosinophils Relative: 2 %
HCT: 30.5 % — ABNORMAL LOW (ref 36.0–46.0)
Hemoglobin: 10.2 g/dL — ABNORMAL LOW (ref 12.0–15.0)
Immature Granulocytes: 1 %
Lymphocytes Relative: 39 %
Lymphs Abs: 3 10*3/uL (ref 0.7–4.0)
MCH: 30.5 pg (ref 26.0–34.0)
MCHC: 33.4 g/dL (ref 30.0–36.0)
MCV: 91.3 fL (ref 80.0–100.0)
Monocytes Absolute: 0.9 10*3/uL (ref 0.1–1.0)
Monocytes Relative: 12 %
Neutro Abs: 3.5 10*3/uL (ref 1.7–7.7)
Neutrophils Relative %: 45 %
Platelets: 280 10*3/uL (ref 150–400)
RBC: 3.34 MIL/uL — ABNORMAL LOW (ref 3.87–5.11)
RDW: 13.6 % (ref 11.5–15.5)
WBC: 7.6 10*3/uL (ref 4.0–10.5)
nRBC: 0 % (ref 0.0–0.2)

## 2023-10-26 LAB — URINALYSIS, W/ REFLEX TO CULTURE (INFECTION SUSPECTED)
Bilirubin Urine: NEGATIVE
Glucose, UA: NEGATIVE mg/dL
Hgb urine dipstick: NEGATIVE
Ketones, ur: NEGATIVE mg/dL
Nitrite: NEGATIVE
Protein, ur: NEGATIVE mg/dL
Specific Gravity, Urine: 1.005 (ref 1.005–1.030)
pH: 7 (ref 5.0–8.0)

## 2023-10-26 LAB — RESP PANEL BY RT-PCR (RSV, FLU A&B, COVID)  RVPGX2
Influenza A by PCR: NEGATIVE
Influenza B by PCR: NEGATIVE
Resp Syncytial Virus by PCR: NEGATIVE
SARS Coronavirus 2 by RT PCR: NEGATIVE

## 2023-10-26 LAB — COMPREHENSIVE METABOLIC PANEL
ALT: 9 U/L (ref 0–44)
AST: 17 U/L (ref 15–41)
Albumin: 3.6 g/dL (ref 3.5–5.0)
Alkaline Phosphatase: 42 U/L (ref 38–126)
Anion gap: 7 (ref 5–15)
BUN: 17 mg/dL (ref 8–23)
CO2: 27 mmol/L (ref 22–32)
Calcium: 8.6 mg/dL — ABNORMAL LOW (ref 8.9–10.3)
Chloride: 96 mmol/L — ABNORMAL LOW (ref 98–111)
Creatinine, Ser: 1.23 mg/dL — ABNORMAL HIGH (ref 0.44–1.00)
GFR, Estimated: 41 mL/min — ABNORMAL LOW (ref >60)
Glucose, Bld: 84 mg/dL (ref 70–99)
Potassium: 4.1 mmol/L (ref 3.5–5.1)
Sodium: 130 mmol/L — ABNORMAL LOW (ref 135–145)
Total Bilirubin: 0.7 mg/dL (ref 0.0–1.2)
Total Protein: 6.2 g/dL — ABNORMAL LOW (ref 6.5–8.1)

## 2023-10-26 LAB — PROTIME-INR
INR: 1.5 — ABNORMAL HIGH (ref 0.8–1.2)
Prothrombin Time: 18.3 s — ABNORMAL HIGH (ref 11.4–15.2)

## 2023-10-26 LAB — TROPONIN I (HIGH SENSITIVITY)
Troponin I (High Sensitivity): 11 ng/L (ref <18)
Troponin I (High Sensitivity): 11 ng/L (ref <18)

## 2023-10-26 LAB — LACTIC ACID, PLASMA
Lactic Acid, Venous: 1 mmol/L (ref 0.5–1.9)
Lactic Acid, Venous: 0.8 mmol/L (ref 0.5–1.9)

## 2023-10-26 LAB — PROCALCITONIN: Procalcitonin: <0.1 ng/mL

## 2023-10-26 LAB — APTT: aPTT: 30 s (ref 24–36)

## 2023-10-26 MED ORDER — ACETAMINOPHEN 650 MG RE SUPP: 650.0000 mg | Freq: Four times a day (QID) | RECTAL | Status: DC | PRN

## 2023-10-26 MED ORDER — APIXABAN 2.5 MG PO TABS
2.5000 mg | ORAL_TABLET | Freq: Two times a day (BID) | ORAL | Status: DC
  Administered 2023-10-26 – 2023-10-28 (×4): 2.5 mg via ORAL
  Filled 2023-10-26 (×4): qty 1

## 2023-10-26 MED ORDER — FENOFIBRATE 54 MG PO TABS
54.0000 mg | ORAL_TABLET | Freq: Every day | ORAL | Status: DC
  Administered 2023-10-26 – 2023-10-28 (×3): 54 mg via ORAL
  Filled 2023-10-26 (×3): qty 1

## 2023-10-26 MED ORDER — ROSUVASTATIN CALCIUM 20 MG PO TABS
20.0000 mg | ORAL_TABLET | Freq: Every day | ORAL | Status: DC
  Filled 2023-10-26: qty 1

## 2023-10-26 MED ORDER — SODIUM CHLORIDE 0.9 % IV SOLN
1.0000 g | Freq: Two times a day (BID) | INTRAVENOUS | Status: DC
  Administered 2023-10-26 – 2023-10-28 (×4): 1 g via INTRAVENOUS
  Filled 2023-10-26 (×4): qty 20

## 2023-10-26 MED ORDER — PANTOPRAZOLE SODIUM 20 MG PO TBEC: 20.0000 mg | DELAYED_RELEASE_TABLET | Freq: Every day | ORAL | Status: DC

## 2023-10-26 MED ORDER — FERROUS SULFATE 325 (65 FE) MG PO TABS
325.0000 mg | ORAL_TABLET | Freq: Every day | ORAL | Status: DC
  Administered 2023-10-27 – 2023-10-28 (×2): 325 mg via ORAL
  Filled 2023-10-26 (×2): qty 1

## 2023-10-26 MED ORDER — APIXABAN 2.5 MG PO TABS
2.5000 mg | ORAL_TABLET | Freq: Two times a day (BID) | ORAL | Status: DC
  Filled 2023-10-26: qty 1

## 2023-10-26 MED ORDER — LEVOTHYROXINE SODIUM 112 MCG PO TABS
112.0000 ug | ORAL_TABLET | Freq: Every day | ORAL | Status: DC
  Administered 2023-10-27 – 2023-10-28 (×2): 112 ug via ORAL
  Filled 2023-10-26 (×2): qty 1

## 2023-10-26 MED ORDER — PANTOPRAZOLE SODIUM 40 MG PO TBEC
40.0000 mg | DELAYED_RELEASE_TABLET | Freq: Every day | ORAL | Status: DC
  Administered 2023-10-26 – 2023-10-28 (×3): 40 mg via ORAL
  Filled 2023-10-26 (×3): qty 1

## 2023-10-26 MED ORDER — SODIUM CHLORIDE 0.9 % IV SOLN
1.0000 g | Freq: Once | INTRAVENOUS | Status: DC
  Filled 2023-10-26: qty 20

## 2023-10-26 MED ORDER — SODIUM CHLORIDE 0.9% FLUSH
3.0000 mL | Freq: Two times a day (BID) | INTRAVENOUS | Status: DC
  Administered 2023-10-26 – 2023-10-27 (×3): 3 mL via INTRAVENOUS

## 2023-10-26 MED ORDER — ONDANSETRON HCL 4 MG PO TABS: 4.0000 mg | ORAL_TABLET | Freq: Four times a day (QID) | ORAL | Status: DC | PRN

## 2023-10-26 MED ORDER — SODIUM CHLORIDE 0.9 % IV SOLN
1.0000 g | Freq: Once | INTRAVENOUS | Status: AC
  Administered 2023-10-26: 1 g via INTRAVENOUS
  Filled 2023-10-26: qty 10

## 2023-10-26 MED ORDER — RANOLAZINE ER 500 MG PO TB12
500.0000 mg | ORAL_TABLET | Freq: Two times a day (BID) | ORAL | Status: DC
  Administered 2023-10-26 – 2023-10-28 (×4): 500 mg via ORAL
  Filled 2023-10-26 (×4): qty 1

## 2023-10-26 MED ORDER — SODIUM CHLORIDE 0.9 % IV SOLN: 1.0000 g | INTRAVENOUS | Status: DC

## 2023-10-26 MED ORDER — ONDANSETRON HCL 4 MG/2ML IJ SOLN: 4.0000 mg | Freq: Four times a day (QID) | INTRAMUSCULAR | Status: DC | PRN

## 2023-10-26 MED ORDER — ACETAMINOPHEN 325 MG PO TABS
650.0000 mg | ORAL_TABLET | Freq: Four times a day (QID) | ORAL | Status: DC | PRN
  Administered 2023-10-27 – 2023-10-28 (×2): 650 mg via ORAL
  Filled 2023-10-26 (×2): qty 2

## 2023-10-26 MED ORDER — LEVOTHYROXINE SODIUM 112 MCG PO TABS: 112.0000 ug | ORAL_TABLET | Freq: Every day | ORAL | Status: DC

## 2023-10-26 MED ORDER — ENOXAPARIN SODIUM 40 MG/0.4ML IJ SOSY: 40.0000 mg | PREFILLED_SYRINGE | INTRAMUSCULAR | Status: DC

## 2023-10-26 MED ORDER — RANOLAZINE ER 500 MG PO TB12: 500.0000 mg | ORAL_TABLET | Freq: Two times a day (BID) | ORAL | Status: DC

## 2023-10-26 MED ORDER — ROSUVASTATIN CALCIUM 20 MG PO TABS
20.0000 mg | ORAL_TABLET | Freq: Every day | ORAL | Status: DC
  Administered 2023-10-26 – 2023-10-28 (×3): 20 mg via ORAL
  Filled 2023-10-26 (×3): qty 1

## 2023-10-26 NOTE — ED Provider Notes (Signed)
 [facility] Provider Note    Event Date/Time   First MD Initiated Contact with Patient 10/26/23 1027     (approximate)   History   Weakness   HPI  Abigail Strong is a 89 y.o. female history of COPD CHF, A-fib, memory loss presents to the ER for evaluation of generalized malaise and "not feeling well.  "Patient not able to provide much additional history.  Denies any recent falls since the last time she was seen in the ER.  Denies any new numbness or tingling.  Does describe mild headache.     Physical Exam   Triage Vital Signs: ED Triage Vitals [10/26/23 1029]  Encounter Vitals Group     BP      Systolic BP Percentile      Diastolic BP Percentile      Pulse      Resp      Temp      Temp src      SpO2      Weight      Height      Head Circumference      Peak Flow      Pain Score 6     Pain Loc      Pain Education      Exclude from Growth Chart     Most recent vital signs: Vitals:   10/26/23 1200  BP: (!) 106/91  Pulse: 71  Resp: 19     Constitutional: Alert  Eyes: Conjunctivae are normal.  Head: Atraumatic. Nose: No congestion/rhinnorhea. Mouth/Throat: Mucous membranes are moist.   Neck: Painless ROM.  Cardiovascular:   Good peripheral circulation. Respiratory: Normal respiratory effort.  No retractions.  Gastrointestinal: Soft and nontender.  Musculoskeletal:  no deformity Neurologic:  MAE spontaneously. No gross focal neurologic deficits are appreciated.  Skin:  Skin is warm, dry and intact. No rash noted.    ED Results / Procedures / Treatments   Labs (all labs ordered are listed, but only abnormal results are displayed) Labs Reviewed  COMPREHENSIVE METABOLIC PANEL - Abnormal; Notable for the following components:      Result Value   Sodium 130 (*)    Chloride 96 (*)    Creatinine, Ser 1.23 (*)    Calcium 8.6 (*)    Total Protein 6.2 (*)    GFR, Estimated 41 (*)    All other components within normal limits   CBC WITH DIFFERENTIAL/PLATELET - Abnormal; Notable for the following components:   RBC 3.34 (*)    Hemoglobin 10.2 (*)    HCT 30.5 (*)    All other components within normal limits  PROTIME-INR - Abnormal; Notable for the following components:   Prothrombin Time 18.3 (*)    INR 1.5 (*)    All other components within normal limits  URINALYSIS, W/ REFLEX TO CULTURE (INFECTION SUSPECTED) - Abnormal; Notable for the following components:   Color, Urine YELLOW (*)    APPearance CLEAR (*)    Leukocytes,Ua MODERATE (*)    Bacteria, UA RARE (*)    All other components within normal limits  RESP PANEL BY RT-PCR (RSV, FLU A&B, COVID)  [id number]  URINE CULTURE  LACTIC ACID, PLASMA  APTT  LACTIC ACID, PLASMA  TROPONIN I (HIGH SENSITIVITY)  TROPONIN I (HIGH SENSITIVITY)     EKG  ED ECG REPORT I, Willy Eddy, the attending physician, personally viewed and interpreted this ECG.   Date: 10/26/2023  EKG Time: 10:31  Rate: 60  Rhythm: sinus  Axis: normal  Intervals: normal  ST&T Change: no stemi, no depressions    RADIOLOGY Please see ED Course for my review and interpretation.  I personally reviewed all radiographic images ordered to evaluate for the above acute complaints and reviewed radiology reports and findings.  These findings were personally discussed with the patient.  Please see medical record for radiology report.    PROCEDURES:  Critical Care performed: No  Procedures   MEDICATIONS ORDERED IN ED: Medications  cefTRIAXone (ROCEPHIN) 1 g in sodium chloride 0.9 % 100 mL IVPB (1 g Intravenous New Bag/Given 10/26/23 1231)     IMPRESSION / MDM / ASSESSMENT AND PLAN / ED COURSE  I reviewed the triage vital signs and the nursing notes.                              Differential diagnosis includes, but is not limited to, Dehydration, sepsis, pna, uti, hypoglycemia, cva, drug effect, withdrawal, encephalitis  Patient presenting to the ER for evaluation of symptoms  as described above.  Based on symptoms, risk factors and considered above differential, this presenting complaint could reflect a potentially life-threatening illness therefore the patient will be placed on continuous pulse oximetry and telemetry for monitoring.  Laboratory evaluation will be sent to evaluate for the above complaints.      Clinical Course as of 10/26/23 1231  Sat Oct 26, 2023  1217 UA consistent with UTI.  Will give Rocephin. [PR]  1230 Patient still bit confused.  Lives at home alone and typically has home health aide during the week.  Given her presentation I do believe that admission to hospital clinically indicated given her comorbidities.  Will consult hospitalist for admission. [PR]    Clinical Course User Index [PR] Willy Eddy, MD     FINAL CLINICAL IMPRESSION(S) / ED DIAGNOSES   Final diagnoses:  Weakness  Confusion  Acute cystitis without hematuria     Rx / DC Orders   ED Discharge Orders     None        Note:  This document was prepared using Dragon voice recognition software and may include unintentional dictation errors.    Willy Eddy, MD 10/26/23 [phone]

## 2023-10-26 NOTE — H&P (Cosign Needed)
 History and Physical    Patient: Abigail Strong ZOX:[id number] DOB: 22-May-1930 DOA: 10/26/2023 DOS: the patient was seen and examined on 10/26/2023 PCP: Margaretann Loveless, MD  Patient coming from: Home-lives with daughter who provides 24-hour care although daughter must work on Friday Saturday and Sunday which leaves the patient alone.  Medical readiness/disposition: Anticipate patient will be ready no later than 10/28/2023.  Will discharge back to home.  May require home health services.  Chief Complaint:  Chief Complaint  Patient presents with   Weakness   HPI: Abigail Strong is a 89 y.o. female with medical history significant of atrial fibrillation, dyslipidemia, hypothyroidism, COPD, and HFpEF.  Brought to ER by EMS this morning due to family reporting altered mental status.  By arrival to ED patient was alert and oriented x 4.  Family and patient reported history of frequent UTI.  Patient has chronic cough for 3 to 4 years with no effective diagnosis.  In the ER patient was afebrile, normotensive and sats were 99% on room air.  Labs were unremarkable except for mild hyponatremia, and abnormal urinalysis concerning for possible UTI.  CT of the head showed no acute intracranial changes.  Chest x-ray demonstrated borderline cardiomegaly with vascular congestion and chronic interstitial coarsening as well as hiatal hernia.  Hospital service was asked to evaluate the patient for admission.  Upon my evaluation of the patient she reports 1 week of symptoms of dizziness and feeling overall malaise.  Reports chronic cough that is worse at night and is unchanged.  She reported dark cloudy urine.  Has frequent nocturia which is at baseline and has frequent urinary incontinence and wears a pad.  She states she did not take her Demadex this morning.  She also reports frequent falls at home.   Review of Systems: As mentioned in the history of present illness. All other systems reviewed and are  negative. Past Medical History:  Diagnosis Date   Depression    GERD (gastroesophageal reflux disease)    Hyperlipidemia    Hyperlipidemia, unspecified 01/01/2014   Hypothyroidism    Insomnia    Musculoskeletal chest pain 09/27/2016   Past Surgical History:  Procedure Laterality Date   ABDOMINAL HYSTERECTOMY     BREAST BIOPSY Bilateral 90's   benign   cataracts     COLON SURGERY     partial colectomy   intestinal blockage     NISSEN FUNDOPLICATION     Social History:  reports that she has never smoked. She has never used smokeless tobacco. She reports that she does not drink alcohol and does not use drugs.  Allergies  Allergen Reactions   Gabapentin Other (See Comments)    Dizziness    Sulfa Antibiotics Other (See Comments)   Sulfonamide Derivatives     Family History  Problem Relation Age of Onset   Cervical cancer Mother    Breast cancer Maternal Aunt     Prior to Admission medications   Medication Sig Start Date End Date Taking? Authorizing Provider  ELIQUIS 2.5 MG TABS tablet Take 1 tablet (2.5 mg total) by mouth 2 (two) times daily. 05/28/23  Yes Adrian Blackwater A, MD  fenofibrate 54 MG tablet TAKE 1 TABLET(54 MG) BY MOUTH DAILY 08/12/23  Yes Margaretann Loveless, MD  hydrOXYzine (ATARAX) 25 MG tablet TAKE 1 TABLET(25 MG) BY MOUTH EVERY 6 HOURS AS NEEDED 09/30/23  Yes Margaretann Loveless, MD  levothyroxine (LEVOXYL) 112 MCG tablet Take 1 tablet (112 mcg total) by mouth  daily before breakfast. 09/27/23  Yes Margaretann Loveless, MD  meclizine (ANTIVERT) 12.5 MG tablet Take 1 tablet (12.5 mg total) by mouth 2 (two) times daily as needed for dizziness. 03/08/23  Yes Margaretann Loveless, MD  pantoprazole (PROTONIX) 40 MG tablet TAKE 1 TABLET(40 MG) BY MOUTH DAILY 07/11/23  Yes Margaretann Loveless, MD  ranolazine (RANEXA) 500 MG 12 hr tablet Take 1 tablet (500 mg total) by mouth 2 (two) times daily. 07/02/23  Yes Margaretann Loveless, MD  rosuvastatin (CRESTOR) 20 MG tablet Take 1 tablet (20 mg total) by mouth  daily. 10/04/22  Yes Margaretann Loveless, MD  torsemide (DEMADEX) 10 MG tablet TAKE 1 TABLET(10 MG) BY MOUTH DAILY 07/22/23  Yes Margaretann Loveless, MD  acetaminophen (TYLENOL) 500 MG tablet Take 1,000 mg by mouth every 6 (six) hours as needed for mild pain or moderate pain.    [provider]  alendronate (FOSAMAX) 70 MG tablet Take 1 tablet by mouth once a week. Patient not taking: Reported on 02/01/2023    [provider]  Cholecalciferol 50 MCG (2000 UT) CAPS Take 2,000 Units by mouth daily.    [provider]  ferrous sulfate 324 MG TBEC Take 324 mg by mouth daily with breakfast.    [provider]  fluticasone (FLONASE) 50 MCG/ACT nasal spray Place 1 spray into both nostrils daily. Patient not taking: Reported on 06/04/2023 03/08/23   Margaretann Loveless, MD  Iron-FA-B Cmp-C-Biot-Probiotic (FUSION PLUS) CAPS Take 1 capsule by mouth daily. 03/20/23   Miki Kins, FNP    Physical Exam: Vitals:   10/26/23 1200 10/26/23 1400 10/26/23 1500 10/26/23 1554  BP: (!) 106/91 (!) 124/92 110/80 129/69  Pulse: 71 65 71 65  Resp: 19 (!) 23 (!) 23 19  Temp:   98.3 F (36.8 C) 98.1 F (36.7 C)  TempSrc:   Oral   SpO2:   99% 99%  Weight:   62.4 kg 64.5 kg  Height:   5\' 5"  (1.651 m) 5\' 5"  (1.651 m)   Constitutional: NAD, calm, comfortable Respiratory: clear to auscultation bilaterally, no wheezing, no crackles. Normal respiratory effort. No accessory muscle use.  With inspiratory effort patient went have mild dry cough but was not consistent. Cardiovascular: Regular rate and rhythm, no murmurs / rubs / gallops. No extremity edema. 2+ pedal pulses.  Abdomen: no tenderness, no masses palpated. No obvious hepatosplenomegaly. Bowel sounds positive.  Musculoskeletal: no clubbing / cyanosis. No joint deformity upper and lower extremities. Good ROM, no contractures. Normal muscle tone.  Skin: no rashes, lesions, ulcers. No induration Neurologic: CN 2-12 grossly intact. Sensation  intact, DTR normal. Strength 4/5 x all 4 extremities.  Psychiatric: Alert and oriented x 3. Normal mood.     Data Reviewed:  Sodium 130, potassium 4.1, chloride 96, glucose 84, BUN 17, creatinine 1.23, LFTs are normal, GFR 41  Troponin 11 x 2 collections, lactic acid normal  WBC 7600 with normal differential, hemoglobin 10.2, platelets 280,000  Urinalysis with clear appearance, yellow color, moderate leukocytes, rare bacteria, WBCs 21-50, urine culture pending  PCR for influenza, RSV and COVID negative  Assessment and Plan: Altered mental status resolved Expect related to evolving UTI Treat underlying causes CTH unremarkable  Abnormal urinalysis concerning for UTI Patient mildly symptomatic per her report and physical exam History of ESBL E. coli in 2022-had been given Rocephin in the ED will switch to meropenem  Frequent falls Ongoing issue-patient does have help at home but not always  24-hour supervision PT evaluation  Chronic cough/reported history of COPD Has been evaluated per her report by pulmonary medicine as well as PCP and apparently cardiology X-ray shows chronic interstitial coarsening and apparent reported history of COPD although patient denies prior smoking history or other environmental exposures Symptoms worse at night and this is likely due to reflux Patient on PPI prior to admission  HFpEF X-ray with possible vascular congestion but other clinical exam does not support exacerbation of heart failure Continue to follow Hold Demadex for now  Hypothyroidism Continue Synthroid  Memory loss Stable at this point and appears to be alert and oriented See above are going AMS  History of PAF Continue Eliquis Heart rate controlled and remains in sinus rhythm  HLD Continue Crestor and fenofibrate    Advance Care Planning:   Code Status: Full Code   VTE prophylaxis: Eliquis  Consults: None  Family Communication: Patient only  Severity of  Illness: The appropriate patient status for this patient is OBSERVATION. Observation status is judged to be reasonable and necessary in order to provide the required intensity of service to ensure the patient's safety. The patient's presenting symptoms, physical exam findings, and initial radiographic and laboratory data in the context of their medical condition is felt to place them at decreased risk for further clinical deterioration. Furthermore, it is anticipated that the patient will be medically stable for discharge from the hospital within 2 midnights of admission.   Author: Junious Silk, NP 10/26/2023 3:59 PM  For on call review [email or web address].    Pt was seen and examined independently by myself on 10/26/23. Agree with above A&P.

## 2023-10-26 NOTE — ED Triage Notes (Signed)
 Pt to ED from home with weakness for a week. Daughter reports to EMS that she had AMS this morning but is A&Ox4 on arrival.    Hx frequent UTI CBG 118 500 mL

## 2023-10-26 NOTE — Plan of Care (Signed)

## 2023-10-26 NOTE — Plan of Care (Signed)
  Problem: Urinary Elimination: Goal: Signs and symptoms of infection will decrease Outcome: Progressing   

## 2023-10-27 DIAGNOSIS — N390 Urinary tract infection, site not specified: Secondary | ICD-10-CM | POA: Diagnosis not present

## 2023-10-27 LAB — BASIC METABOLIC PANEL
Anion gap: 4 — ABNORMAL LOW (ref 5–15)
BUN: 15 mg/dL (ref 8–23)
CO2: 27 mmol/L (ref 22–32)
Calcium: 8.9 mg/dL (ref 8.9–10.3)
Chloride: 101 mmol/L (ref 98–111)
Creatinine, Ser: 1.14 mg/dL — ABNORMAL HIGH (ref 0.44–1.00)
GFR, Estimated: 45 mL/min — ABNORMAL LOW (ref >60)
Glucose, Bld: 92 mg/dL (ref 70–99)
Potassium: 3.9 mmol/L (ref 3.5–5.1)
Sodium: 132 mmol/L — ABNORMAL LOW (ref 135–145)

## 2023-10-27 MED ORDER — MELATONIN 5 MG PO TABS: 5.0000 mg | ORAL_TABLET | Freq: Once | ORAL | Status: DC

## 2023-10-27 MED ORDER — DIPHENHYDRAMINE HCL 25 MG PO CAPS
50.0000 mg | ORAL_CAPSULE | Freq: Every day | ORAL | Status: DC
Start: 2023-10-27 — End: 2023-10-28
  Administered 2023-10-27: 50 mg via ORAL
  Filled 2023-10-27: qty 2

## 2023-10-27 NOTE — Plan of Care (Signed)
  Problem: Education: Goal: Knowledge of General Education information will improve Description: Including pain rating scale, medication(s)/side effects and non-pharmacologic comfort measures Outcome: Progressing   Problem: Clinical Measurements: Goal: Ability to maintain clinical measurements within normal limits will improve Outcome: Progressing Goal: Will remain free from infection Outcome: Progressing Goal: Diagnostic test results will improve Outcome: Progressing Goal: Respiratory complications will improve Outcome: Progressing Goal: Cardiovascular complication will be avoided Outcome: Progressing   Problem: Nutrition: Goal: Adequate nutrition will be maintained Outcome: Progressing   Problem: Activity: Goal: Risk for activity intolerance will decrease Outcome: Progressing   Problem: Coping: Goal: Level of anxiety will decrease Outcome: Progressing   Problem: Elimination: Goal: Will not experience complications related to bowel motility Outcome: Progressing Goal: Will not experience complications related to urinary retention Outcome: Progressing   Problem: Skin Integrity: Goal: Risk for impaired skin integrity will decrease Outcome: Progressing   Problem: Urinary Elimination: Goal: Signs and symptoms of infection will decrease Outcome: Progressing

## 2023-10-27 NOTE — Progress Notes (Signed)
 PROGRESS NOTE    Abigail Strong  ZOX:[id number] DOB: 1930/05/03 DOA: 10/26/2023 PCP: Margaretann Loveless, MD   Assessment & Plan:   Principal Problem:   Urinary tract infection  Assessment and Plan:  Possible UTI: c/o dysuria. UA is positive. Urine cx is pending still. Hx of ESBL, so will continue on IV merrem   Frequent falls: PT recs SNF. Pt's daughter adamantly refused SNF but agrees to [facility].   COPD: w/o exacerbation. Bronchodilators prn   Chronic diastolic CHF: holding home dose of demadex. Monitor I/Os  Hypothyroidism: continue on home dose of levothyroxine   Memory loss: continue w/ supportive care   PAF: not on any rate controlling meds as per med rec. Continue on home dose of eliquis   HLD: continue on fenofibrate, statin     DVT prophylaxis: eliquis  Code Status: DNR Family Communication: discussed pt's care w/ pt's daughter, Jasmine December, and answered her questions  Disposition Plan: d/c back home w/ HH   Level of care: Med-Surg  Status is: Observation The patient remains OBS appropriate and will d/c before 2 midnights.    Consultants:    Procedures:   Antimicrobials: merrem   Subjective: Pt c/o not sleeping well & dysuria.  Objective: Vitals:   10/26/23 1554 10/26/23 1955 10/27/23 0441 10/27/23 0744  BP: 129/69 101/68 (!) 86/44 111/67  Pulse: 65 64 70 66  Resp: 19 18 18 18   Temp: 98.1 F (36.7 C) 97.9 F (36.6 C) 98.1 F (36.7 C) 97.6 F (36.4 C)  TempSrc:  Oral Oral   SpO2: 99% 100% 96% 98%  Weight: 64.5 kg     Height: 5\' 5"  (1.651 m)       Intake/Output Summary (Last 24 hours) at 10/27/2023 0802 Last data filed at 10/27/2023 0558 Gross per 24 hour  Intake 240 ml  Output 1050 ml  Net -810 ml   Filed Weights   10/26/23 1500 10/26/23 1554  Weight: 62.4 kg 64.5 kg    Examination:  General exam: Appears comfortable  Respiratory system: clear breath sounds b/l  Cardiovascular system: S1/S2+. No rubs or clicks  Gastrointestinal system:  Abd is soft, NT, ND & hypoactive bowel sounds  Central nervous system: Alert & awake. Moves all extremities Psychiatry: judgement and insight appears at baseline. Appropriate mood and affect     Data Reviewed: I have personally reviewed following labs and imaging studies  CBC: Recent Labs  Lab 10/26/23 1037  WBC 7.6  NEUTROABS 3.5  HGB 10.2*  HCT 30.5*  MCV 91.3  PLT 280   Basic Metabolic Panel: Recent Labs  Lab 10/26/23 1037 10/27/23 0624  NA 130* 132*  K 4.1 3.9  CL 96* 101  CO2 27 27  GLUCOSE 84 92  BUN 17 15  CREATININE 1.23* 1.14*  CALCIUM 8.6* 8.9   GFR: Estimated Creatinine Clearance: 28.9 mL/min (A) (by C-G formula based on SCr of 1.14 mg/dL (H)). Liver Function Tests: Recent Labs  Lab 10/26/23 1037  AST 17  ALT 9  ALKPHOS 42  BILITOT 0.7  PROT 6.2*  ALBUMIN 3.6   No results for input(s): "LIPASE", "AMYLASE" in the last 168 hours. No results for input(s): "AMMONIA" in the last 168 hours. Coagulation Profile: Recent Labs  Lab 10/26/23 1037  INR 1.5*   Cardiac Enzymes: No results for input(s): "CKTOTAL", "CKMB", "CKMBINDEX", "TROPONINI" in the last 168 hours. BNP (last 3 results) No results for input(s): "PROBNP" in the last 8760 hours. HbA1C: No results for input(s): "HGBA1C" in  the last 72 hours. CBG: No results for input(s): "GLUCAP" in the last 168 hours. Lipid Profile: No results for input(s): "CHOL", "HDL", "LDLCALC", "TRIG", "CHOLHDL", "LDLDIRECT" in the last 72 hours. Thyroid Function Tests: No results for input(s): "TSH", "T4TOTAL", "FREET4", "T3FREE", "THYROIDAB" in the last 72 hours. Anemia Panel: No results for input(s): "VITAMINB12", "FOLATE", "FERRITIN", "TIBC", "IRON", "RETICCTPCT" in the last 72 hours. Sepsis Labs: Recent Labs  Lab 10/26/23 1037 10/26/23 1136 10/26/23 1230  PROCALCITON  --  <0.10  --   LATICACIDVEN 1.0  --  0.8    Recent Results (from the past 240 hours)  Resp panel by RT-PCR (RSV, Flu A&B, Covid)  Anterior Nasal Swab     Status: None   Collection Time: 10/26/23 10:37 AM   Specimen: Anterior Nasal Swab  Result Value Ref Range Status   SARS Coronavirus 2 by RT PCR NEGATIVE NEGATIVE Final    Comment: (NOTE) SARS-CoV-2 target nucleic acids are NOT DETECTED.  The SARS-CoV-2 RNA is generally detectable in upper respiratory specimens during the acute phase of infection. The lowest concentration of SARS-CoV-2 viral copies this assay can detect is 138 copies/mL. A negative result does not preclude SARS-Cov-2 infection and should not be used as the sole basis for treatment or other patient management decisions. A negative result may occur with  improper specimen collection/handling, submission of specimen other than nasopharyngeal swab, presence of viral mutation(s) within the areas targeted by this assay, and inadequate number of viral copies(<138 copies/mL). A negative result must be combined with clinical observations, patient history, and epidemiological information. The expected result is Negative.  Fact Sheet for Patients:  BloggerCourse.com  Fact Sheet for Healthcare Providers:  SeriousBroker.it  This test is no t yet approved or cleared by the [place] FDA and  has been authorized for detection and/or diagnosis of SARS-CoV-2 by FDA under an Emergency Use Authorization (EUA). This EUA will remain  in effect (meaning this test can be used) for the duration of the COVID-19 declaration under Section 564(b)(1) of the Act, 21 U.S.C.section 360bbb-3(b)(1), unless the authorization is terminated  or revoked sooner.       Influenza A by PCR NEGATIVE NEGATIVE Final   Influenza B by PCR NEGATIVE NEGATIVE Final    Comment: (NOTE) The Xpert Xpress SARS-CoV-2/FLU/RSV plus assay is intended as an aid in the diagnosis of influenza from Nasopharyngeal swab specimens and should not be used as a sole basis for treatment. Nasal washings  and aspirates are unacceptable for Xpert Xpress SARS-CoV-2/FLU/RSV testing.  Fact Sheet for Patients: BloggerCourse.com  Fact Sheet for Healthcare Providers: SeriousBroker.it  This test is not yet approved or cleared by the [place] FDA and has been authorized for detection and/or diagnosis of SARS-CoV-2 by FDA under an Emergency Use Authorization (EUA). This EUA will remain in effect (meaning this test can be used) for the duration of the COVID-19 declaration under Section 564(b)(1) of the Act, 21 U.S.C. section 360bbb-3(b)(1), unless the authorization is terminated or revoked.     Resp Syncytial Virus by PCR NEGATIVE NEGATIVE Final    Comment: (NOTE) Fact Sheet for Patients: BloggerCourse.com  Fact Sheet for Healthcare Providers: SeriousBroker.it  This test is not yet approved or cleared by the [place] FDA and has been authorized for detection and/or diagnosis of SARS-CoV-2 by FDA under an Emergency Use Authorization (EUA). This EUA will remain in effect (meaning this test can be used) for the duration of the COVID-19 declaration under Section 564(b)(1) of the Act, 21 U.S.C.  section 360bbb-3(b)(1), unless the authorization is terminated or revoked.  Performed at [facility], [address], [place], [place] [address]          Radiology Studies: CT HEAD WO CONTRAST ( ) Result Date: 10/26/2023 CLINICAL DATA:  Status change.  Weakness for 1 week. EXAM: CT HEAD WITHOUT CONTRAST TECHNIQUE: Contiguous axial images were obtained from the base of the skull through the vertex without intravenous contrast. RADIATION DOSE REDUCTION: This exam was performed according to the departmental dose-optimization program which includes automated exposure control, adjustment of the mA and/or kV according to patient size and/or use of iterative reconstruction technique.  COMPARISON:  CT head without contrast 08/30/2023. FINDINGS: Brain: No acute infarct, hemorrhage, or mass lesion is present. No significant white matter lesions are present. Deep brain nuclei are within normal limits. The brainstem and cerebellum are within normal limits. Midline structures are within normal limits. Vascular: Mild atherosclerotic calcifications are present within the cavernous internal carotid arteries bilaterally. No hyperdense vessel is present. Skull: Calvarium is intact. No focal lytic or blastic lesions are present. No significant extracranial soft tissue lesion is present. Sinuses/Orbits: The paranasal sinuses and mastoid air cells are clear. Bilateral lens replacements are noted. Globes and orbits are otherwise unremarkable. IMPRESSION: 1. Normal CT appearance of the brain. No acute or focal lesion to explain the patient's symptoms. 2. Mild atherosclerotic calcifications within the cavernous internal carotid arteries bilaterally. Electronically Signed   By: Marin Roberts M.D.   On: 10/26/2023 11:18   DG Chest Port 1 View Result Date: 10/26/2023 CLINICAL DATA:  Sepsis. EXAM: PORTABLE CHEST 1 VIEW COMPARISON:  04/19/2023 FINDINGS: Stable asymmetric elevation right hemidiaphragm. Cardiopericardial silhouette is at upper limits of normal for size. Vascular congestion. Interstitial markings are diffusely coarsened with chronic features. No acute bony abnormality. Telemetry leads overlie the chest. Hiatal hernia again noted. Gaseous distention of bowel noted in the visualized upper abdomen. IMPRESSION: Stable. Borderline cardiomegaly with vascular congestion and chronic interstitial coarsening. Hiatal hernia. Electronically Signed   By: Kennith Center M.D.   On: 10/26/2023 11:05        Scheduled Meds:  apixaban  2.5 mg Oral BID   fenofibrate  54 mg Oral Daily   ferrous sulfate  325 mg Oral Q breakfast   levothyroxine  112 mcg Oral QAC breakfast   melatonin  5 mg Oral Once    pantoprazole  40 mg Oral Daily   ranolazine  500 mg Oral BID   rosuvastatin  20 mg Oral Daily   sodium chloride flush  3 mL Intravenous Q12H   Continuous Infusions:  meropenem (MERREM) IV 1 g (10/26/23 2316)     LOS: 0 days       Charise Killian, MD [facility] Pager [phone]  If 7PM-7AM, please contact night-coverage [email or web address] 10/27/2023, 8:02 AM

## 2023-10-27 NOTE — Evaluation (Signed)
 Physical Therapy Evaluation Patient Details Name: Abigail Strong MRN: [id number] DOB: [birth year] Today's Date: 10/27/2023  History of Present Illness  Patient is a 89 year old with AMS, UTI, falls, weakness. History significant of atrial fibrillation, dyslipidemia, hypothyroidism, COPD, and HFpEF.  Clinical Impression  Patient is agreeable to PT evaluation. She reports frequent falls in the home despite using her rolling walker. She lives with her daughter and patient reports the daughter works from home.  Today the patient required Min A for bed mobility, Min A for standing, and Min A- CGA for short distance ambulation in the room using a rolling walker. She has generalized weakness and is limited by fatigue. Anticipate patient will require some intermittent physical assistance with mobility if returning home. Consider rehabilitation < 3 hours/day after this hospital stay if patient will not have enough assistance in the home. PT will continue to follow to maximize independence and decrease caregiver burden.       If plan is discharge home, recommend the following: A little help with walking and/or transfers;A little help with bathing/dressing/bathroom;Help with stairs or ramp for entrance;Assistance with cooking/housework   Can travel by private vehicle   Yes    Equipment Recommendations None recommended by PT  Recommendations for Other Services       Functional Status Assessment Patient has had a recent decline in their functional status and demonstrates the ability to make significant improvements in function in a reasonable and predictable amount of time.     Precautions / Restrictions Precautions Precautions: Fall Restrictions Weight Bearing Restrictions Per Provider Order: No      Mobility  Bed Mobility Overal bed mobility: Needs Assistance Bed Mobility: Supine to Sit     Supine to sit: Min assist     General bed mobility comments: assistance to shift pelvis  forward on bed in preparation for standing    Transfers Overall transfer level: Needs assistance Equipment used: Rolling walker (2 wheels) Transfers: Sit to/from Stand Sit to Stand: Min assist           General transfer comment: lifting assistance provided for standing. cues for technique    Ambulation/Gait Ambulation/Gait assistance: Min assist, Contact guard assist Gait Distance (Feet): 12 Feet Assistive device: Rolling walker (2 wheels) Gait Pattern/deviations: Step-to pattern, Decreased stance time - left, Decreased stride length Gait velocity: decreased     General Gait Details: intermittent assistance provided for rolling walker negotiation. cues for safety and fall prevention  Stairs            Wheelchair Mobility     Tilt Bed    Modified Rankin (Stroke Patients Only)       Balance Overall balance assessment: Needs assistance Sitting-balance support: Feet supported Sitting balance-Leahy Scale: Fair     Standing balance support: Bilateral upper extremity supported Standing balance-Leahy Scale: Poor Standing balance comment: external support provided                             Pertinent Vitals/Pain Pain Assessment Pain Assessment: No/denies pain    Home Living Family/patient expects to be discharged to:: Private residence Living Arrangements: Children (daughter) Available Help at Discharge: Family Type of Home: House Home Access: Stairs to enter         Home Equipment: Agricultural consultant (2 wheels);BSC/3in1 Additional Comments: daughter works from home    Prior Function Prior Level of Function : History of Falls (last six months);Needs assist  Mobility Comments: Mod I for ambulation with rolling walker, frequent falls reported. very limited walking ADLs Comments: daughter assists per patient report     Extremity/Trunk Assessment   Upper Extremity Assessment Upper Extremity Assessment: Generalized weakness     Lower Extremity Assessment Lower Extremity Assessment: Generalized weakness       Communication   Communication Communication: Impaired Factors Affecting Communication: Hearing impaired    Cognition Arousal: Alert Behavior During Therapy: WFL for tasks assessed/performed   PT - Cognitive impairments: No apparent impairments                         Following commands: Impaired Following commands impaired: Follows one step commands with increased time     Cueing Cueing Techniques: Verbal cues     General Comments General comments (skin integrity, edema, etc.): patient reports frequent falls at home and having to call EMS to assist her off the floor    Exercises     Assessment/Plan    PT Assessment Patient needs continued PT services  PT Problem List Decreased strength;Decreased range of motion;Decreased activity tolerance;Decreased balance;Decreased mobility;Decreased safety awareness       PT Treatment Interventions DME instruction;Gait training;Stair training;Functional mobility training;Therapeutic activities;Therapeutic exercise;Balance training;Neuromuscular re-education;Cognitive remediation;Patient/family education    PT Goals (Current goals can be found in the Care Plan section)  Acute Rehab PT Goals Patient Stated Goal: to avoid falls PT Goal Formulation: With patient Time For Goal Achievement: 11/10/23 Potential to Achieve Goals: Fair    Frequency Min 2X/week     Co-evaluation               AM-PAC PT "6 Clicks" Mobility  Outcome Measure Help needed turning from your back to your side while in a flat bed without using bedrails?: A Little Help needed moving from lying on your back to sitting on the side of a flat bed without using bedrails?: A Little Help needed moving to and from a bed to a chair (including a wheelchair)?: A Little Help needed standing up from a chair using your arms (e.g., wheelchair or bedside chair)?: A  Little Help needed to walk in hospital room?: A Little Help needed climbing 3-5 steps with a railing? : A Lot 6 Click Score: 17    End of Session   Activity Tolerance: Patient limited by fatigue Patient left: with call bell/phone within reach;with bed alarm set;in chair   PT Visit Diagnosis: History of falling (Z91.81);Muscle weakness (generalized) (M62.81)    Time: 1610-9604 PT Time Calculation (min) (ACUTE ONLY): 21 min   Charges:   PT Evaluation $PT Eval Low Complexity: 1 Low PT Treatments $Therapeutic Activity: 8-22 mins PT General Charges $$ ACUTE PT VISIT: 1 Visit         Donna Bernard, PT, MPT   [name] 10/27/2023, 12:12 PM

## 2023-10-27 NOTE — Care Management Obs Status (Signed)
 MEDICARE OBSERVATION STATUS NOTIFICATION   Patient Details  Name: Abigail Strong MRN: [id number] Date of Birth: 1930/05/24   Medicare Observation Status Notification Given:  Orland Dec, CMA 10/27/2023, 1:23 PM

## 2023-10-27 NOTE — TOC Progression Note (Signed)
 Transition of Care [facility]) - Progression Note    Patient Details  Name: Abigail Strong MRN: [id number] Date of Birth: 08/27/1929  Transition of Care [facility]) CM/SW Contact  Bing Quarry, RN Phone Number: 10/27/2023, 2:24 PM  Clinical Narrative:  3/9: Admitted 3/825 via EMS to [facility] ED from home with history of weakness for a week/AMS this am but A&O x4 per ED triage on arrive to ED. Lives with Daughter. Declined SNF recommendations opting for going home with home health. Spoke with granddaughter regarding home health choices and insurance. Agreed to [facility] and confirmed with [facility]. Has RW/BSC/Lift chair and will consult with daughter if any other needs for discharge to home with home health potential on Monday 11/06/23. No covered SDOH alerts this admission noted.  Daughter number at work: [phone] direct to her desk. Cell phone number not working.  Granddaughter: Em contact is:  Mitzi Davenport Publishing rights manager) [phone] [facility] Phone)  PCP: Margaretann Loveless [phone]   Gabriel Cirri MSN RN CM  RN Case Manager [facility]  Transitions of Care Direct Dial: [phone] (Weekends Only) [facility] Main Office Phone: [phone] [facility] Fax: [phone] [email or web address]          Expected Discharge Plan and Services                                   HH Arranged: PT, OT HH Agency: [facility] Date [facility] Agency Contacted: 10/27/23 Time HH Agency Contacted: 1424 Representative spoke with at [facility] Agency: Kandee Keen   Social Determinants of Health (SDOH) Interventions SDOH Screenings   Food Insecurity: No Food Insecurity (10/26/2023)  Housing: Low Risk  (10/26/2023)  Transportation Needs: No Transportation Needs (10/26/2023)  Utilities: Not At Risk (10/26/2023)  Alcohol Screen: Low Risk  (05/04/2021)  Depression (PHQ2-9): Low Risk  (05/04/2021)  Financial Resource Strain: High Risk (05/04/2021)  Physical Activity: Inactive (05/04/2021)  Social Connections: Unknown (10/26/2023)   Stress: No Stress Concern Present (05/04/2021)  Tobacco Use: Low Risk  (06/06/2023)    Readmission Risk Interventions     No data to display

## 2023-10-27 NOTE — Evaluation (Signed)
 Occupational Therapy Evaluation Patient Details Name: Abigail Strong MRN: [id number] DOB: 06-25-1930 Today's Date: 10/27/2023   History of Present Illness   Patient is a 89 year old with AMS, UTI, falls, weakness. History significant of atrial fibrillation, dyslipidemia, hypothyroidism, COPD, and HFpEF.     Clinical Impressions Patient presenting with decreased Ind in self care,balance, functional mobility/transfers, endurance, and safety awareness. Patient reports living at home with daughter who assists her with dressing and IADLs. Pt endorses being able to ambulate with RW but has multiple falls at home.  Patient currently functioning at min -mod A overall for functional mobility and self care tasks. Pt fatigues quickly during session and remains in recliner chair with RN in room. Patient will benefit from acute OT to increase overall independence in the areas of ADLs, functional mobility, and safety awareness in order to safely discharge.     If plan is discharge home, recommend the following:   A lot of help with walking and/or transfers;A lot of help with bathing/dressing/bathroom;Assistance with cooking/housework;Assist for transportation     Functional Status Assessment   Patient has had a recent decline in their functional status and demonstrates the ability to make significant improvements in function in a reasonable and predictable amount of time.     Equipment Recommendations   None recommended by OT      Precautions/Restrictions   Precautions Precautions: Fall     Mobility Bed Mobility               General bed mobility comments: seated in recliner chair    Transfers Overall transfer level: Needs assistance Equipment used: Rolling walker (2 wheels) Transfers: Sit to/from Stand Sit to Stand: Min assist                  Balance Overall balance assessment: Needs assistance Sitting-balance support: Feet supported Sitting balance-Leahy  Scale: Fair     Standing balance support: Bilateral upper extremity supported Standing balance-Leahy Scale: Poor Standing balance comment: external support provided                           ADL either performed or assessed with clinical judgement   ADL Overall ADL's : Needs assistance/impaired Eating/Feeding: Set up;Sitting                                           Vision Patient Visual Report: No change from baseline              Pertinent Vitals/Pain Pain Assessment Pain Assessment: No/denies pain     Extremity/Trunk Assessment Upper Extremity Assessment Upper Extremity Assessment: Generalized weakness   Lower Extremity Assessment Lower Extremity Assessment: Generalized weakness       Communication Communication Communication: Impaired Factors Affecting Communication: Hearing impaired   Cognition Arousal: Alert Behavior During Therapy: WFL for tasks assessed/performed Cognition: No apparent impairments                               Following commands: Impaired Following commands impaired: Follows one step commands with increased time     Cueing  General Comments   Cueing Techniques: Verbal cues  patient reports frequent falls at home and having to call EMS to assist her off the floor  Home Living Family/patient expects to be discharged to:: Private residence Living Arrangements: Children (daughter) Available Help at Discharge: Family Type of Home: House Home Access: Stairs to enter           Bathroom Shower/Tub: Sponge bathes at baseline         Home Equipment: Agricultural consultant (2 wheels);BSC/3in1   Additional Comments: daughter works from home      Prior Functioning/Environment Prior Level of Function : History of Falls (last six months);Needs assist             Mobility Comments: Mod I for ambulation with rolling walker, frequent falls reported. very limited walking ADLs  Comments: daughter assists with self ADLs and IADLs. Pt reports she can wash herself and does her own toileting but needs assistance with all her dressing.    OT Problem List: Decreased strength;Decreased activity tolerance;Decreased safety awareness;Impaired balance (sitting and/or standing);Decreased knowledge of use of DME or AE   OT Treatment/Interventions: Self-care/ADL training;Therapeutic exercise;Therapeutic activities;Energy conservation;DME and/or AE instruction;Patient/family education;Balance training      OT Goals(Current goals can be found in the care plan section)   Acute Rehab OT Goals Patient Stated Goal: to go home OT Goal Formulation: With patient Time For Goal Achievement: 11/10/23 Potential to Achieve Goals: Fair   OT Frequency:  Min 1X/week       AM-PAC OT "6 Clicks" Daily Activity     Outcome Measure Help from another person eating meals?: A Little Help from another person taking care of personal grooming?: A Little Help from another person toileting, which includes using toliet, bedpan, or urinal?: A Lot Help from another person bathing (including washing, rinsing, drying)?: A Lot Help from another person to put on and taking off regular upper body clothing?: A Little Help from another person to put on and taking off regular lower body clothing?: A Lot 6 Click Score: 15   End of Session Equipment Utilized During Treatment: Rolling walker (2 wheels) Nurse Communication: Mobility status  Activity Tolerance: Patient tolerated treatment well Patient left: in chair;with call bell/phone within reach;with chair alarm set  OT Visit Diagnosis: Unsteadiness on feet (R26.81);Repeated falls (R29.6);Muscle weakness (generalized) (M62.81)                Time: 1610-9604 OT Time Calculation (min): 14 min Charges:  OT General Charges $OT Visit: 1 Visit OT Treatments $Self Care/Home Management : 8-22 mins  Jackquline Denmark, MS, OTR/L , CBIS ascom [phone]   10/27/23, 2:27 PM

## 2023-10-28 DIAGNOSIS — N390 Urinary tract infection, site not specified: Secondary | ICD-10-CM | POA: Diagnosis not present

## 2023-10-28 LAB — CBC
HCT: 27.7 % — ABNORMAL LOW (ref 36.0–46.0)
Hemoglobin: 9.6 g/dL — ABNORMAL LOW (ref 12.0–15.0)
MCH: 30.3 pg (ref 26.0–34.0)
MCHC: 34.7 g/dL (ref 30.0–36.0)
MCV: 87.4 fL (ref 80.0–100.0)
Platelets: 269 10*3/uL (ref 150–400)
RBC: 3.17 MIL/uL — ABNORMAL LOW (ref 3.87–5.11)
RDW: 13.4 % (ref 11.5–15.5)
WBC: 7.6 10*3/uL (ref 4.0–10.5)
nRBC: 0 % (ref 0.0–0.2)

## 2023-10-28 LAB — BASIC METABOLIC PANEL
Anion gap: 7 (ref 5–15)
BUN: 18 mg/dL (ref 8–23)
CO2: 27 mmol/L (ref 22–32)
Calcium: 8.8 mg/dL — ABNORMAL LOW (ref 8.9–10.3)
Chloride: 98 mmol/L (ref 98–111)
Creatinine, Ser: 1.09 mg/dL — ABNORMAL HIGH (ref 0.44–1.00)
GFR, Estimated: 48 mL/min — ABNORMAL LOW (ref >60)
Glucose, Bld: 86 mg/dL (ref 70–99)
Potassium: 4.2 mmol/L (ref 3.5–5.1)
Sodium: 132 mmol/L — ABNORMAL LOW (ref 135–145)

## 2023-10-28 LAB — URINE CULTURE: Culture: >=100000 — AB

## 2023-10-28 MED ORDER — CITALOPRAM HYDROBROMIDE 20 MG PO TABS
20.0000 mg | ORAL_TABLET | Freq: Every day | ORAL | Status: DC
  Filled 2023-10-28: qty 1

## 2023-10-28 MED ORDER — CITALOPRAM HYDROBROMIDE 20 MG PO TABS: 20.0000 mg | ORAL_TABLET | Freq: Every day | ORAL | 0 refills | Status: DC

## 2023-10-28 MED ORDER — POLYETHYLENE GLYCOL 3350 17 G PO PACK
17.0000 g | PACK | Freq: Every day | ORAL | Status: DC
  Administered 2023-10-28: 17 g via ORAL
  Filled 2023-10-28: qty 1

## 2023-10-28 MED ORDER — CEFDINIR 300 MG PO CAPS: 300.0000 mg | ORAL_CAPSULE | Freq: Two times a day (BID) | ORAL | 0 refills | Status: AC

## 2023-10-28 NOTE — NC FL2 (Signed)
 [place] MEDICAID FL2 LEVEL OF CARE FORM     IDENTIFICATION  Patient Name: Abigail Strong Birthdate: 07/28/1930 Sex: female Admission Date (Current Location): 10/26/2023  [facility] and [place] Number:  Chiropodist and Address:  [facility], [address], [place], [place] [address]      Provider Number: [id number]  Attending Physician Name and Address:  Charise Killian, MD  Relative Name and Phone Number:       Current Level of Care: Hospital Recommended Level of Care: Skilled Nursing Facility Prior Approval Number:    Date Approved/Denied:   PASRR Number: [id number]  Discharge Plan: SNF    Current Diagnoses: Patient Active Problem List   Diagnosis Date Noted   Urinary tract infection 10/26/2023   Head injury 06/04/2023   SAH (subarachnoid hemorrhage) (HCC) 06/04/2023   SDH (subdural hematoma) (HCC) 06/04/2023   Seasonal allergic rhinitis due to pollen 03/08/2023   Benign positional vertigo, bilateral 03/08/2023   Nausea 03/08/2023   Chronic atrial fibrillation (HCC) 10/04/2022   CHF (congestive heart failure), NYHA class III, acute on chronic, diastolic (HCC) 09/21/2022   Acute metabolic encephalopathy 03/09/2021   Acute lower UTI 03/09/2021   Skin tear of lower leg without complication, left, initial encounter 01/19/2021   Memory loss 01/19/2021   Breast pain, left 11/17/2020   Right shoulder pain 09/01/2020   Disorder of rotator cuff, left 09/01/2020   Cervical spine arthritis 07/21/2020   Neuralgia of chest 07/21/2020   Annual physical exam 05/17/2020   Need for influenza vaccination 05/17/2020   Osteoarthritis of both knees 04/26/2020   Dizzinesses 01/21/2020   Swelling of limb 01/12/2020   Bruit of right carotid artery 01/12/2020   Edema of lower leg due to peripheral venous insufficiency 12/28/2019   Hip pain 12/25/2018   Partial small bowel obstruction (HCC) 09/28/2018   Arthritis of knee 02/19/2017   COPD  exacerbation (HCC) 09/27/2016   Pneumonia 09/27/2016   Influenza A 09/27/2016   Prediabetes 09/27/2016   Leukocytosis 09/27/2016   Chronic fatigue, unspecified 09/27/2016   Acute respiratory failure with hypoxia (HCC) 09/22/2016   Chronic bronchitis (HCC) 09/09/2016   Osteoarthritis of knee 03/23/2016   Diverticulitis 03/23/2015   Depression 01/01/2014   GERD (gastroesophageal reflux disease) 01/01/2014   Mixed hyperlipidemia 01/01/2014   Hypothyroidism, unspecified 01/01/2014   OA (osteoarthritis) 01/01/2014   Essential hypertension 12/08/2009   COUGH 12/08/2009    Orientation RESPIRATION BLADDER Height & Weight     Self, Time, Situation, Place  Normal Incontinent Weight: 142 lb 3.2 oz (64.5 kg) Height:  5\' 5"  (165.1 cm)  BEHAVIORAL SYMPTOMS/MOOD NEUROLOGICAL BOWEL NUTRITION STATUS      Continent Diet (see d/c summary)  AMBULATORY STATUS COMMUNICATION OF NEEDS Skin   Extensive Assist Verbally Normal                       Personal Care Assistance Level of Assistance  Bathing, Feeding, Dressing Bathing Assistance: Limited assistance Feeding assistance: Independent Dressing Assistance: Limited assistance     Functional Limitations Info  Sight, Hearing, Speech Sight Info: Adequate Hearing Info: Adequate Speech Info: Adequate    SPECIAL CARE FACTORS FREQUENCY  OT (By licensed OT), PT (By licensed PT)     PT Frequency: 5x/week OT Frequency: 5x/week            Contractures Contractures Info: Not present    Additional Factors Info  Code Status, Allergies Code Status Info: DNR Allergies Info: gabapentin, Sulfa  Antibiotics, Sulfonamide Derivatives           Current Medications (10/28/2023):  This is the current hospital active medication list Current Facility-Administered Medications  Medication Dose Route Frequency Provider Last Rate Last Admin   acetaminophen (TYLENOL) tablet 650 mg  650 mg Oral Q6H PRN Russella Dar, NP   650 mg at 10/27/23 2018    Or   acetaminophen (TYLENOL) suppository 650 mg  650 mg Rectal Q6H PRN Russella Dar, NP       apixaban Everlene Balls) tablet 2.5 mg  2.5 mg Oral BID Russella Dar, NP   2.5 mg at 10/27/23 2018   diphenhydrAMINE (BENADRYL) capsule 50 mg  50 mg Oral QHS Charise Killian, MD   50 mg at 10/27/23 2018   fenofibrate tablet 54 mg  54 mg Oral Daily Russella Dar, NP   54 mg at 10/27/23 1008   ferrous sulfate tablet 325 mg  325 mg Oral Q breakfast Charise Killian, MD   325 mg at 10/27/23 1008   levothyroxine (SYNTHROID) tablet 112 mcg  112 mcg Oral QAC breakfast Russella Dar, NP   112 mcg at 10/28/23 4098   melatonin tablet 5 mg  5 mg Oral Once Manuela Schwartz, NP       meropenem (MERREM) 1 g in sodium chloride 0.9 % 100 mL IVPB  1 g Intravenous Q12H Charise Killian, MD 200 mL/hr at 10/27/23 2022 1 g at 10/27/23 2022   ondansetron (ZOFRAN) tablet 4 mg  4 mg Oral Q6H PRN Russella Dar, NP       Or   ondansetron [facility]) injection 4 mg  4 mg Intravenous Q6H PRN Russella Dar, NP       pantoprazole (PROTONIX) EC tablet 40 mg  40 mg Oral Daily Charise Killian, MD   40 mg at 10/27/23 1008   polyethylene glycol (MIRALAX / GLYCOLAX) packet 17 g  17 g Oral Daily Charise Killian, MD       ranolazine (RANEXA) 12 hr tablet 500 mg  500 mg Oral BID Russella Dar, NP   500 mg at 10/27/23 2018   rosuvastatin (CRESTOR) tablet 20 mg  20 mg Oral Daily Russella Dar, NP   20 mg at 10/27/23 1008   sodium chloride flush (NS) 0.9 % injection 3 mL  3 mL Intravenous Q12H Russella Dar, NP   3 mL at 10/27/23 2022     Discharge Medications: Please see discharge summary for a list of discharge medications.  Relevant Imaging Results:  Relevant Lab Results:   Additional Information SSN [id number] [place], [place]

## 2023-10-28 NOTE — Plan of Care (Signed)
  Problem: Education: Goal: Knowledge of General Education information will improve Description: Including pain rating scale, medication(s)/side effects and non-pharmacologic comfort measures Outcome: Progressing   Problem: Health Behavior/Discharge Planning: Goal: Ability to manage health-related needs will improve Outcome: Progressing   Problem: Clinical Measurements: Goal: Ability to maintain clinical measurements within normal limits will improve Outcome: Progressing Goal: Will remain free from infection Outcome: Progressing Goal: Diagnostic test results will improve Outcome: Progressing Goal: Respiratory complications will improve Outcome: Progressing Goal: Cardiovascular complication will be avoided Outcome: Progressing   Problem: Nutrition: Goal: Adequate nutrition will be maintained Outcome: Progressing   Problem: Coping: Goal: Level of anxiety will decrease Outcome: Progressing   Problem: Elimination: Goal: Will not experience complications related to bowel motility Outcome: Progressing Goal: Will not experience complications related to urinary retention Outcome: Progressing   Problem: Skin Integrity: Goal: Risk for impaired skin integrity will decrease Outcome: Progressing   Problem: Pain Managment: Goal: General experience of comfort will improve and/or be controlled Outcome: Progressing   Problem: Urinary Elimination: Goal: Signs and symptoms of infection will decrease Outcome: Progressing

## 2023-10-28 NOTE — TOC Transition Note (Signed)
 Transition of Care [facility]) - Discharge Note   Patient Details  Name: Abigail Strong MRN: [id number] Date of Birth: Jun 02, 1930  Transition of [facility]) CM/SW Contact:  Erin Sons, LCSW Phone Number: 10/28/2023, 2:24 PM   Clinical Narrative:     CSW spoke with pt daughter regarding disposition. Explained pt's BCBS medicare is out of network with [facility]. Daughter opting for pt to return home with [facility]. HH has been arrange with [facility]. Daughter requesting EMS transport. EMS arranged for next available with [facility]  Final next level of care: Home w Home Health Services     Patient Goals and CMS Choice     Choice offered to / list presented to :  (Granddaughter/Em Contact)           Discharge Plan and Services Additional resources added to the After Visit Summary for                            [facility] Arranged: PT, OT HH Agency: [facility] Date [facility] Agency Contacted: 10/27/23 Time HH Agency Contacted: [phone] Representative spoke with at [facility] Agency: Kandee Keen  Social Drivers of Health (SDOH) Interventions SDOH Screenings   Food Insecurity: No Food Insecurity (10/26/2023)  Housing: Low Risk  (10/26/2023)  Transportation Needs: No Transportation Needs (10/26/2023)  Utilities: Not At Risk (10/26/2023)  Alcohol Screen: Low Risk  (05/04/2021)  Depression (PHQ2-9): Low Risk  (05/04/2021)  Financial Resource Strain: High Risk (05/04/2021)  Physical Activity: Inactive (05/04/2021)  Social Connections: Unknown (10/26/2023)  Stress: No Stress Concern Present (05/04/2021)  Tobacco Use: Low Risk  (06/06/2023)     Readmission Risk Interventions     No data to display

## 2023-10-28 NOTE — Discharge Summary (Addendum)
 Physician Discharge Summary  Abigail Strong HCW:[id number] DOB: 12-12-1929 DOA: 10/26/2023  PCP: Margaretann Loveless, MD  Admit date: 10/26/2023 Discharge date: 10/28/2023  Admitted From: home  Disposition:  home w/ home health   Recommendations for Outpatient Follow-up:  Follow up with PCP in 1-2 weeks  Home Health: yes Equipment/Devices:  Discharge Condition: stable CODE STATUS:DNR Diet recommendation: Regular   Brief/Interim Summary:  HPI was taken from NP Margaretha Glassing Abigail Strong is a 89 y.o. female with medical history significant of atrial fibrillation, dyslipidemia, hypothyroidism, COPD, and HFpEF.  Brought to ER by EMS this morning due to family reporting altered mental status.  By arrival to ED patient was alert and oriented x 4.  Family and patient reported history of frequent UTI.  Patient has chronic cough for 3 to 4 years with no effective diagnosis.  In the ER patient was afebrile, normotensive and sats were 99% on room air.  Labs were unremarkable except for mild hyponatremia, and abnormal urinalysis concerning for possible UTI.  CT of the head showed no acute intracranial changes.  Chest x-ray demonstrated borderline cardiomegaly with vascular congestion and chronic interstitial coarsening as well as hiatal hernia.  Hospital service was asked to evaluate the patient for admission.   Upon my evaluation of the patient she reports 1 week of symptoms of dizziness and feeling overall malaise.  Reports chronic cough that is worse at night and is unchanged.  She reported dark cloudy urine.  Has frequent nocturia which is at baseline and has frequent urinary incontinence and wears a pad.  She states she did not take her Demadex this morning.  She also reports frequent falls at home.  Discharge Diagnoses:  Principal Problem:   Urinary tract infection UTI: c/o dysuria. UA is positive. Urine cx is growing e. coli. Hx of ESBL. D/c home on po cefdinir to complete the course   Frequent  falls: PT recs SNF. Pt's daughter adamantly refused SNF but agrees to [facility].   COPD: w/o exacerbation. Bronchodilators prn   Chronic diastolic CHF: holding home dose of demadex. Monitor I/Os  Hypothyroidism: continue on home dose of levothyroxine   Memory loss: continue w/ supportive care   PAF: not on any rate controlling meds as per med rec. Continue on home dose of eliquis   HLD: continue on fenofibrate, statin    Depression: severity unknown. Started on citalopram and will continue at d/c. Can take up to 6 weeks to start to work    Discharge Instructions  Discharge Instructions     Diet general   Complete by: As directed    Discharge instructions   Complete by: As directed    F/u PCP in 1-2 weeks   Increase activity slowly   Complete by: As directed       Allergies as of 10/28/2023       Reactions   Gabapentin Other (See Comments)   Dizziness    Sulfa Antibiotics Other (See Comments)   Sulfonamide Derivatives         Medication List     TAKE these medications    acetaminophen 500 MG tablet Commonly known as: TYLENOL Take 1,000 mg by mouth every 6 (six) hours as needed for mild pain or moderate pain.   cefdinir 300 MG capsule Commonly known as: OMNICEF Take 1 capsule (300 mg total) by mouth 2 (two) times daily for 7 days.   Cholecalciferol 50 MCG (2000 UT) Caps Take 2,000 Units by mouth daily.   citalopram  20 MG tablet Commonly known as: CELEXA Take 1 tablet (20 mg total) by mouth daily.   Eliquis 2.5 MG Tabs tablet Generic drug: apixaban Take 1 tablet (2.5 mg total) by mouth 2 (two) times daily.   fenofibrate 54 MG tablet TAKE 1 TABLET(54 MG) BY MOUTH DAILY   ferrous sulfate 324 MG Tbec Take 324 mg by mouth daily with breakfast.   fluticasone 50 MCG/ACT nasal spray Commonly known as: FLONASE Place 1 spray into both nostrils daily.   Fusion Plus Caps Take 1 capsule by mouth daily.   hydrOXYzine 25 MG tablet Commonly known as: ATARAX TAKE  1 TABLET(25 MG) BY MOUTH EVERY 6 HOURS AS NEEDED   levothyroxine 112 MCG tablet Commonly known as: Levoxyl Take 1 tablet (112 mcg total) by mouth daily before breakfast.   meclizine 12.5 MG tablet Commonly known as: ANTIVERT Take 1 tablet (12.5 mg total) by mouth 2 (two) times daily as needed for dizziness.   pantoprazole 40 MG tablet Commonly known as: PROTONIX TAKE 1 TABLET(40 MG) BY MOUTH DAILY   ranolazine 500 MG 12 hr tablet Commonly known as: RANEXA Take 1 tablet (500 mg total) by mouth 2 (two) times daily.   rosuvastatin 20 MG tablet Commonly known as: CRESTOR Take 1 tablet (20 mg total) by mouth daily.   torsemide 10 MG tablet Commonly known as: DEMADEX TAKE 1 TABLET(10 MG) BY MOUTH DAILY        Follow-up Information     Margaretann Loveless, MD Follow up.   Specialty: Internal Medicine Why: Hospital follow up Contact information: [address] [place] [address] [phone]         Care, [facility] Follow up.   Specialty: Home Health Services Why: Home Health is arranged with [facility]. They will call to schedule first home appointment. Contact information: [address] [place] [address] [phone]                Allergies  Allergen Reactions   Gabapentin Other (See Comments)    Dizziness    Sulfa Antibiotics Other (See Comments)   Sulfonamide Derivatives     Consultations:    Procedures/Studies: CT HEAD WO CONTRAST ( ) Result Date: 10/26/2023 CLINICAL DATA:  Status change.  Weakness for 1 week. EXAM: CT HEAD WITHOUT CONTRAST TECHNIQUE: Contiguous axial images were obtained from the base of the skull through the vertex without intravenous contrast. RADIATION DOSE REDUCTION: This exam was performed according to the departmental dose-optimization program which includes automated exposure control, adjustment of the mA and/or kV according to patient size and/or use of iterative reconstruction technique. COMPARISON:   CT head without contrast 08/30/2023. FINDINGS: Brain: No acute infarct, hemorrhage, or mass lesion is present. No significant white matter lesions are present. Deep brain nuclei are within normal limits. The brainstem and cerebellum are within normal limits. Midline structures are within normal limits. Vascular: Mild atherosclerotic calcifications are present within the cavernous internal carotid arteries bilaterally. No hyperdense vessel is present. Skull: Calvarium is intact. No focal lytic or blastic lesions are present. No significant extracranial soft tissue lesion is present. Sinuses/Orbits: The paranasal sinuses and mastoid air cells are clear. Bilateral lens replacements are noted. Globes and orbits are otherwise unremarkable. IMPRESSION: 1. Normal CT appearance of the brain. No acute or focal lesion to explain the patient's symptoms. 2. Mild atherosclerotic calcifications within the cavernous internal carotid arteries bilaterally. Electronically Signed   By: Marin Roberts M.D.   On: 10/26/2023 11:18  DG Chest Port 1 View Result Date: 10/26/2023 CLINICAL DATA:  Sepsis. EXAM: PORTABLE CHEST 1 VIEW COMPARISON:  04/19/2023 FINDINGS: Stable asymmetric elevation right hemidiaphragm. Cardiopericardial silhouette is at upper limits of normal for size. Vascular congestion. Interstitial markings are diffusely coarsened with chronic features. No acute bony abnormality. Telemetry leads overlie the chest. Hiatal hernia again noted. Gaseous distention of bowel noted in the visualized upper abdomen. IMPRESSION: Stable. Borderline cardiomegaly with vascular congestion and chronic interstitial coarsening. Hiatal hernia. Electronically Signed   By: Kennith Center M.D.   On: 10/26/2023 11:05   (Echo, Carotid, EGD, Colonoscopy, ERCP)    Subjective: Pt c/o fatigue    Discharge Exam: Vitals:   10/28/23 0346 10/28/23 0735  BP: (!) 105/53 100/61  Pulse: 67 65  Resp: 18 17  Temp: 97.7 F (36.5 C) 98 F (36.7  C)  SpO2: 95% 95%   Vitals:   10/27/23 1717 10/27/23 2040 10/28/23 0346 10/28/23 0735  BP: (!) 100/56 (!) 106/52 (!) 105/53 100/61  Pulse: 75 72 67 65  Resp: 18 16 18 17   Temp: 98.2 F (36.8 C) 98 F (36.7 C) 97.7 F (36.5 C) 98 F (36.7 C)  TempSrc: Oral  Axillary   SpO2: 97% 97% 95% 95%  Weight:      Height:        General: Pt is alert, awake, not in acute distress Cardiovascular: S1/S2 +, no rubs, no gallops Respiratory: decreased breath sounds b/l  Abdominal: Soft, NT, ND, bowel sounds + Extremities: no edema, no cyanosis    The results of significant diagnostics from this hospitalization (including imaging, microbiology, ancillary and laboratory) are listed below for reference.     Microbiology: Recent Results (from the past 240 hours)  Resp panel by RT-PCR (RSV, Flu A&B, Covid) Anterior Nasal Swab     Status: None   Collection Time: 10/26/23 10:37 AM   Specimen: Anterior Nasal Swab  Result Value Ref Range Status   SARS Coronavirus 2 by RT PCR NEGATIVE NEGATIVE Final    Comment: (NOTE) SARS-CoV-2 target nucleic acids are NOT DETECTED.  The SARS-CoV-2 RNA is generally detectable in upper respiratory specimens during the acute phase of infection. The lowest concentration of SARS-CoV-2 viral copies this assay can detect is 138 copies/mL. A negative result does not preclude SARS-Cov-2 infection and should not be used as the sole basis for treatment or other patient management decisions. A negative result may occur with  improper specimen collection/handling, submission of specimen other than nasopharyngeal swab, presence of viral mutation(s) within the areas targeted by this assay, and inadequate number of viral copies(<138 copies/mL). A negative result must be combined with clinical observations, patient history, and epidemiological information. The expected result is Negative.  Fact Sheet for Patients:  BloggerCourse.com  Fact Sheet  for Healthcare Providers:  SeriousBroker.it  This test is no t yet approved or cleared by the [place] FDA and  has been authorized for detection and/or diagnosis of SARS-CoV-2 by FDA under an Emergency Use Authorization (EUA). This EUA will remain  in effect (meaning this test can be used) for the duration of the COVID-19 declaration under Section 564(b)(1) of the Act, 21 U.S.C.section 360bbb-3(b)(1), unless the authorization is terminated  or revoked sooner.       Influenza A by PCR NEGATIVE NEGATIVE Final   Influenza B by PCR NEGATIVE NEGATIVE Final    Comment: (NOTE) The Xpert Xpress SARS-CoV-2/FLU/RSV plus assay is intended as an aid in the diagnosis of influenza from Nasopharyngeal swab  specimens and should not be used as a sole basis for treatment. Nasal washings and aspirates are unacceptable for Xpert Xpress SARS-CoV-2/FLU/RSV testing.  Fact Sheet for Patients: BloggerCourse.com  Fact Sheet for Healthcare Providers: SeriousBroker.it  This test is not yet approved or cleared by the [place] FDA and has been authorized for detection and/or diagnosis of SARS-CoV-2 by FDA under an Emergency Use Authorization (EUA). This EUA will remain in effect (meaning this test can be used) for the duration of the COVID-19 declaration under Section 564(b)(1) of the Act, 21 U.S.C. section 360bbb-3(b)(1), unless the authorization is terminated or revoked.     Resp Syncytial Virus by PCR NEGATIVE NEGATIVE Final    Comment: (NOTE) Fact Sheet for Patients: BloggerCourse.com  Fact Sheet for Healthcare Providers: SeriousBroker.it  This test is not yet approved or cleared by the [place] FDA and has been authorized for detection and/or diagnosis of SARS-CoV-2 by FDA under an Emergency Use Authorization (EUA). This EUA will remain in effect (meaning  this test can be used) for the duration of the COVID-19 declaration under Section 564(b)(1) of the Act, 21 U.S.C. section 360bbb-3(b)(1), unless the authorization is terminated or revoked.  Performed at [facility], [address], [place], [place] [address]   Urine Culture     Status: Abnormal   Collection Time: 10/26/23 10:37 AM   Specimen: Urine, Random  Result Value Ref Range Status   Specimen Description   Final    URINE, RANDOM Performed at [facility], [address] [place], [place] [address]    Special Requests   Final    NONE Reflexed from [id number] Performed at [facility], [address] [place], [place] [address]    Culture >=100,000 COLONIES/mL ESCHERICHIA COLI (A)  Final   Report Status 10/28/2023 FINAL  Final   Organism ID, Bacteria ESCHERICHIA COLI (A)  Final      Susceptibility   Escherichia coli - MIC*    AMPICILLIN <=2 SENSITIVE Sensitive     CEFAZOLIN <=4 SENSITIVE Sensitive     CEFEPIME <=0.12 SENSITIVE Sensitive     CEFTRIAXONE <=0.25 SENSITIVE Sensitive     CIPROFLOXACIN <=0.25 SENSITIVE Sensitive     GENTAMICIN <=1 SENSITIVE Sensitive     IMIPENEM <=0.25 SENSITIVE Sensitive     NITROFURANTOIN <=16 SENSITIVE Sensitive     TRIMETH/SULFA <=20 SENSITIVE Sensitive     AMPICILLIN/SULBACTAM <=2 SENSITIVE Sensitive     PIP/TAZO <=4 SENSITIVE Sensitive ug/mL    * >=100,000 COLONIES/mL ESCHERICHIA COLI     Labs: BNP (last 3 results) No results for input(s): "BNP" in the last 8760 hours. Basic Metabolic Panel: Recent Labs  Lab 10/26/23 1037 10/27/23 0624 10/28/23 0449  NA 130* 132* 132*  K 4.1 3.9 4.2  CL 96* 101 98  CO2 27 27 27   GLUCOSE 84 92 86  BUN 17 15 18   CREATININE 1.23* 1.14* 1.09*  CALCIUM 8.6* 8.9 8.8*   Liver Function Tests: Recent Labs  Lab 10/26/23 1037  AST 17  ALT 9  ALKPHOS 42  BILITOT 0.7  PROT 6.2*  ALBUMIN 3.6   No results for input(s): "LIPASE", "AMYLASE" in the last 168 hours. No  results for input(s): "AMMONIA" in the last 168 hours. CBC: Recent Labs  Lab 10/26/23 1037 10/28/23 0449  WBC 7.6 7.6  NEUTROABS 3.5  --   HGB 10.2* 9.6*  HCT 30.5* 27.7*  MCV 91.3 87.4  PLT 280 269   Cardiac Enzymes: No results for input(s): "CKTOTAL", "  CKMB", "CKMBINDEX", "TROPONINI" in the last 168 hours. BNP: Invalid input(s): "POCBNP" CBG: No results for input(s): "GLUCAP" in the last 168 hours. D-Dimer No results for input(s): "DDIMER" in the last 72 hours. Hgb A1c No results for input(s): "HGBA1C" in the last 72 hours. Lipid Profile No results for input(s): "CHOL", "HDL", "LDLCALC", "TRIG", "CHOLHDL", "LDLDIRECT" in the last 72 hours. Thyroid function studies No results for input(s): "TSH", "T4TOTAL", "T3FREE", "THYROIDAB" in the last 72 hours.  Invalid input(s): "FREET3" Anemia work up No results for input(s): "VITAMINB12", "FOLATE", "FERRITIN", "TIBC", "IRON", "RETICCTPCT" in the last 72 hours. Urinalysis    Component Value Date/Time   COLORURINE YELLOW (A) 10/26/2023 1037   APPEARANCEUR CLEAR (A) 10/26/2023 1037   APPEARANCEUR Cloudy (A) 09/07/2021 1133   LABSPEC 1.005 10/26/2023 1037   PHURINE 7.0 10/26/2023 1037   GLUCOSEU NEGATIVE 10/26/2023 1037   HGBUR NEGATIVE 10/26/2023 1037   BILIRUBINUR NEGATIVE 10/26/2023 1037   BILIRUBINUR neg 05/14/2023 1147   BILIRUBINUR Negative 09/07/2021 1133   KETONESUR NEGATIVE 10/26/2023 1037   PROTEINUR NEGATIVE 10/26/2023 1037   UROBILINOGEN 0.2 05/14/2023 1147   NITRITE NEGATIVE 10/26/2023 1037   LEUKOCYTESUR MODERATE (A) 10/26/2023 1037   Sepsis Labs Recent Labs  Lab 10/26/23 1037 10/28/23 0449  WBC 7.6 7.6   Microbiology Recent Results (from the past 240 hours)  Resp panel by RT-PCR (RSV, Flu A&B, Covid) Anterior Nasal Swab     Status: None   Collection Time: 10/26/23 10:37 AM   Specimen: Anterior Nasal Swab  Result Value Ref Range Status   SARS Coronavirus 2 by RT PCR NEGATIVE NEGATIVE Final     Comment: (NOTE) SARS-CoV-2 target nucleic acids are NOT DETECTED.  The SARS-CoV-2 RNA is generally detectable in upper respiratory specimens during the acute phase of infection. The lowest concentration of SARS-CoV-2 viral copies this assay can detect is 138 copies/mL. A negative result does not preclude SARS-Cov-2 infection and should not be used as the sole basis for treatment or other patient management decisions. A negative result may occur with  improper specimen collection/handling, submission of specimen other than nasopharyngeal swab, presence of viral mutation(s) within the areas targeted by this assay, and inadequate number of viral copies(<138 copies/mL). A negative result must be combined with clinical observations, patient history, and epidemiological information. The expected result is Negative.  Fact Sheet for Patients:  BloggerCourse.com  Fact Sheet for Healthcare Providers:  SeriousBroker.it  This test is no t yet approved or cleared by the [place] FDA and  has been authorized for detection and/or diagnosis of SARS-CoV-2 by FDA under an Emergency Use Authorization (EUA). This EUA will remain  in effect (meaning this test can be used) for the duration of the COVID-19 declaration under Section 564(b)(1) of the Act, 21 U.S.C.section 360bbb-3(b)(1), unless the authorization is terminated  or revoked sooner.       Influenza A by PCR NEGATIVE NEGATIVE Final   Influenza B by PCR NEGATIVE NEGATIVE Final    Comment: (NOTE) The Xpert Xpress SARS-CoV-2/FLU/RSV plus assay is intended as an aid in the diagnosis of influenza from Nasopharyngeal swab specimens and should not be used as a sole basis for treatment. Nasal washings and aspirates are unacceptable for Xpert Xpress SARS-CoV-2/FLU/RSV testing.  Fact Sheet for Patients: BloggerCourse.com  Fact Sheet for Healthcare  Providers: SeriousBroker.it  This test is not yet approved or cleared by the [place] FDA and has been authorized for detection and/or diagnosis of SARS-CoV-2 by FDA under an Emergency Use Authorization (EUA).  This EUA will remain in effect (meaning this test can be used) for the duration of the COVID-19 declaration under Section 564(b)(1) of the Act, 21 U.S.C. section 360bbb-3(b)(1), unless the authorization is terminated or revoked.     Resp Syncytial Virus by PCR NEGATIVE NEGATIVE Final    Comment: (NOTE) Fact Sheet for Patients: BloggerCourse.com  Fact Sheet for Healthcare Providers: SeriousBroker.it  This test is not yet approved or cleared by the [place] FDA and has been authorized for detection and/or diagnosis of SARS-CoV-2 by FDA under an Emergency Use Authorization (EUA). This EUA will remain in effect (meaning this test can be used) for the duration of the COVID-19 declaration under Section 564(b)(1) of the Act, 21 U.S.C. section 360bbb-3(b)(1), unless the authorization is terminated or revoked.  Performed at [facility], [address] [place], [place] [address]   Urine Culture     Status: Abnormal   Collection Time: 10/26/23 10:37 AM   Specimen: Urine, Random  Result Value Ref Range Status   Specimen Description   Final    URINE, RANDOM Performed at [facility], [address] [place], [place] [address]    Special Requests   Final    NONE Reflexed from [id number] Performed at [facility], [address] [place], [place] [address]    Culture >=100,000 COLONIES/mL ESCHERICHIA COLI (A)  Final   Report Status 10/28/2023 FINAL  Final   Organism ID, Bacteria ESCHERICHIA COLI (A)  Final      Susceptibility   Escherichia coli - MIC*    AMPICILLIN <=2 SENSITIVE Sensitive     CEFAZOLIN <=4 SENSITIVE Sensitive     CEFEPIME <=0.12 SENSITIVE Sensitive      CEFTRIAXONE <=0.25 SENSITIVE Sensitive     CIPROFLOXACIN <=0.25 SENSITIVE Sensitive     GENTAMICIN <=1 SENSITIVE Sensitive     IMIPENEM <=0.25 SENSITIVE Sensitive     NITROFURANTOIN <=16 SENSITIVE Sensitive     TRIMETH/SULFA <=20 SENSITIVE Sensitive     AMPICILLIN/SULBACTAM <=2 SENSITIVE Sensitive     PIP/TAZO <=4 SENSITIVE Sensitive ug/mL    * >=100,000 COLONIES/mL ESCHERICHIA COLI     Time coordinating discharge: Over 30 minutes  SIGNED:   Charise Killian, MD  [facility] 10/28/2023, 12:54 PM Pager   If 7PM-7AM, please contact night-coverage [email or web address]

## 2023-10-28 NOTE — Progress Notes (Signed)
 Physical Therapy Treatment Patient Details Name: Abigail Strong MRN: [id number] DOB: [birth year] Today's Date: 10/28/2023   History of Present Illness Patient is a 89 year old with AMS, UTI, falls, weakness. History significant of atrial fibrillation, dyslipidemia, hypothyroidism, COPD, and HFpEF.    PT Comments  Pt received in bed with c/o "feeling depressed" due to current hospital stay. Pt easily redirected and agreeable to OOB activity. Currently she requires MinA for bed mobility, transfers, and short distance gait - not at her current baseline LOF. Pt requests [facility] at d/c, however out of network, therefore daughter would like to take her home with services. Will continue to progress pt acutely per POC.   If plan is discharge home, recommend the following: A little help with walking and/or transfers;A little help with bathing/dressing/bathroom;Help with stairs or ramp for entrance;Assistance with cooking/housework   Can travel by private vehicle     Yes  Equipment Recommendations  None recommended by PT (If pt returns home with daughter, she may benefit from a transfer lightweight w/c due to decreased mobility)    Recommendations for Other Services       Precautions / Restrictions Precautions Precautions: Fall Restrictions Weight Bearing Restrictions Per Provider Order: No     Mobility  Bed Mobility Overal bed mobility: Needs Assistance Bed Mobility: Supine to Sit     Supine to sit: Min assist, HOB elevated, Used rails          Transfers Overall transfer level: Needs assistance Equipment used: Rolling walker (2 wheels) Transfers: Sit to/from Stand Sit to Stand: Min assist           General transfer comment: lifting assistance provided for standing. cues for technique    Ambulation/Gait Ambulation/Gait assistance: Min assist, Contact guard assist Gait Distance (Feet): 5 Feet Assistive device: Rolling walker (2 wheels) Gait Pattern/deviations:  Step-to pattern, Decreased stance time - left, Decreased stride length Gait velocity: decreased     General Gait Details: intermittent assistance provided for rolling walker negotiation. cues for safety and fall prevention   Stairs             Wheelchair Mobility     Tilt Bed    Modified Rankin (Stroke Patients Only)       Balance Overall balance assessment: Needs assistance Sitting-balance support: Feet supported Sitting balance-Leahy Scale: Fair     Standing balance support: Bilateral upper extremity supported, During functional activity, Reliant on assistive device for balance Standing balance-Leahy Scale: Poor Standing balance comment: external support provided                            Communication Communication Communication: Impaired Factors Affecting Communication: Hearing impaired  Cognition Arousal: Alert Behavior During Therapy: WFL for tasks assessed/performed   PT - Cognitive impairments: No apparent impairments                         Following commands: Impaired Following commands impaired: Follows one step commands with increased time    Cueing Cueing Techniques: Verbal cues  Exercises General Exercises - Lower Extremity Ankle Circles/Pumps: AROM, Both, 10 reps, Seated Long Arc Quad: AROM, Both, 10 reps, Seated Hip Flexion/Marching: AROM, Both, 10 reps, Seated (Modified seated march)    General Comments General comments (skin integrity, edema, etc.):  (Pt educated on role of PT and benefits of OOB.)      Pertinent Vitals/Pain Pain Assessment Pain Assessment: No/denies pain  Home Living                          Prior Function            PT Goals (current goals can now be found in the care plan section) Acute Rehab PT Goals Patient Stated Goal: to avoid falls Progress towards PT goals: Progressing toward goals    Frequency    Min 2X/week      PT Plan      Co-evaluation               AM-PAC PT "6 Clicks" Mobility   Outcome Measure  Help needed turning from your back to your side while in a flat bed without using bedrails?: A Little Help needed moving from lying on your back to sitting on the side of a flat bed without using bedrails?: A Little Help needed moving to and from a bed to a chair (including a wheelchair)?: A Little Help needed standing up from a chair using your arms (e.g., wheelchair or bedside chair)?: A Little Help needed to walk in hospital room?: A Little Help needed climbing 3-5 steps with a railing? : A Lot 6 Click Score: 17    End of Session Equipment Utilized During Treatment: Gait belt Activity Tolerance: Patient limited by fatigue Patient left: with call bell/phone within reach;in chair;with chair alarm set Nurse Communication: Mobility status PT Visit Diagnosis: History of falling (Z91.81);Muscle weakness (generalized) (M62.81)     Time: 1111-1140 PT Time Calculation (min) (ACUTE ONLY): 29 min  Charges:    $Gait Training: 8-22 mins $Therapeutic Exercise: 8-22 mins PT General Charges $$ ACUTE PT VISIT: 1 Visit                    Zadie Cleverly, PTA  Jannet Askew 10/28/2023, 11:56 AM

## 2023-11-08 ENCOUNTER — Encounter: Payer: Self-pay | Admitting: Internal Medicine

## 2023-11-08 ENCOUNTER — Ambulatory Visit: Admitting: Internal Medicine

## 2023-11-08 DIAGNOSIS — N390 Urinary tract infection, site not specified: Secondary | ICD-10-CM

## 2023-11-08 LAB — POCT URINALYSIS DIPSTICK
Bilirubin, UA: NEGATIVE
Glucose, UA: NEGATIVE
Ketones, UA: NEGATIVE
Leukocytes, UA: NEGATIVE
Nitrite, UA: NEGATIVE
Protein, UA: NEGATIVE
Spec Grav, UA: 1.015 (ref 1.010–1.025)
Urobilinogen, UA: 0.2 U/dL
pH, UA: 7 (ref 5.0–8.0)

## 2023-11-08 NOTE — Progress Notes (Signed)
 Urine dipstick is clear.

## 2023-11-11 NOTE — Progress Notes (Signed)
 Patient notified

## 2023-11-12 ENCOUNTER — Telehealth: Payer: Self-pay | Admitting: Internal Medicine

## 2023-11-12 NOTE — Telephone Encounter (Signed)
 Patient's daughter left VM that the patient is wanting to go into a nursing home because she keeps falling and she is afraid that she is going to get hurt. Daughter states she doesn't know what to do and wants a call back.

## 2023-11-14 ENCOUNTER — Encounter: Payer: Self-pay | Admitting: Internal Medicine

## 2023-11-14 ENCOUNTER — Ambulatory Visit (INDEPENDENT_AMBULATORY_CARE_PROVIDER_SITE_OTHER): Admitting: Internal Medicine

## 2023-11-14 VITALS — BP 108/62 | HR 68 | Ht 62.0 in | Wt 140.0 lb

## 2023-11-14 DIAGNOSIS — M170 Bilateral primary osteoarthritis of knee: Secondary | ICD-10-CM | POA: Diagnosis not present

## 2023-11-14 DIAGNOSIS — I482 Chronic atrial fibrillation, unspecified: Secondary | ICD-10-CM

## 2023-11-14 DIAGNOSIS — I872 Venous insufficiency (chronic) (peripheral): Secondary | ICD-10-CM

## 2023-11-14 DIAGNOSIS — I2510 Atherosclerotic heart disease of native coronary artery without angina pectoris: Secondary | ICD-10-CM

## 2023-11-14 DIAGNOSIS — R600 Localized edema: Secondary | ICD-10-CM

## 2023-11-14 DIAGNOSIS — I10 Essential (primary) hypertension: Secondary | ICD-10-CM | POA: Diagnosis not present

## 2023-11-14 DIAGNOSIS — E038 Other specified hypothyroidism: Secondary | ICD-10-CM | POA: Diagnosis not present

## 2023-11-14 NOTE — Progress Notes (Signed)
 Established Patient Office Visit  Subjective:  Patient ID: Abigail Strong, female    DOB: April 12, 1930  Age: 89 y.o. MRN: [id number]  Chief Complaint  Patient presents with   Follow-up    Fall/hospital follow up    Patient is brought in a wheel chair by daughter and grand daughter. She is suffering from frequent falls, as she is unsteady on her feet and has severe osteoarthritis. Also being on several medications make her dizzy and light headed at times. Currently she lives with her daughter who is the primary caregiver. Patient wants to be placed in a nursing home for more close attention. They are looking at different facilities. Meanwhile will set up Home health with Physical therapy. She is awake and alert, but does look weak and frail.    No other concerns at this time.   Past Medical History:  Diagnosis Date   Depression    GERD (gastroesophageal reflux disease)    Hyperlipidemia    Hyperlipidemia, unspecified 01/01/2014   Hypothyroidism    Insomnia    Musculoskeletal chest pain 09/27/2016    Past Surgical History:  Procedure Laterality Date   ABDOMINAL HYSTERECTOMY     BREAST BIOPSY Bilateral 90's   benign   cataracts     COLON SURGERY     partial colectomy   intestinal blockage     NISSEN FUNDOPLICATION      Social History   Socioeconomic History   Marital status: Married    Spouse name: Not on file   Number of children: Not on file   Years of education: Not on file   Highest education level: Not on file  Occupational History   Not on file  Tobacco Use   Smoking status: Never   Smokeless tobacco: Never  Substance and Sexual Activity   Alcohol use: No   Drug use: No   Sexual activity: Not Currently    Birth control/protection: Post-menopausal  Other Topics Concern   Not on file  Social History Narrative   Not on file   Social Drivers of Health   Financial Resource Strain: High Risk (05/04/2021)   Overall Financial Resource Strain (CARDIA)     Difficulty of Paying Living Expenses: Very hard  Food Insecurity: No Food Insecurity (10/26/2023)   Hunger Vital Sign    Worried About Running Out of Food in the Last Year: Never true    Ran Out of Food in the Last Year: Never true  Transportation Needs: No Transportation Needs (10/26/2023)   PRAPARE - Administrator, Civil Service (Medical): No    Lack of Transportation (Non-Medical): No  Physical Activity: Inactive (05/04/2021)   Exercise Vital Sign    Days of Exercise per Week: 0 days    Minutes of Exercise per Session: 0 min  Stress: No Stress Concern Present (05/04/2021)   [employer] of Occupational Health - Occupational Stress Questionnaire    Feeling of Stress : Only a little  Social Connections: Unknown (10/26/2023)   Social Connection and Isolation Panel [NHANES]    Frequency of Communication with Friends and Family: More than three times a week    Frequency of Social Gatherings with Friends and Family: More than three times a week    Attends Religious Services: Patient unable to answer    Active Member of Clubs or Organizations: Patient unable to answer    Attends Banker Meetings: Never    Marital Status: Widowed  Intimate Partner Violence: Not At Risk (  10/26/2023)   Humiliation, Afraid, Rape, and Kick questionnaire    Fear of Current or Ex-Partner: No    Emotionally Abused: No    Physically Abused: No    Sexually Abused: No    Family History  Problem Relation Age of Onset   Cervical cancer Mother    Breast cancer Maternal Aunt     Allergies  Allergen Reactions   Gabapentin Other (See Comments)    Dizziness    Sulfa Antibiotics Other (See Comments)   Sulfonamide Derivatives     Outpatient Medications Prior to Visit  Medication Sig   acetaminophen (TYLENOL) 500 MG tablet Take 1,000 mg by mouth every 6 (six) hours as needed for mild pain or moderate pain.   Cholecalciferol 50 MCG (2000 UT) CAPS Take 2,000 Units by mouth daily.    citalopram (CELEXA) 20 MG tablet Take 1 tablet (20 mg total) by mouth daily.   ELIQUIS 2.5 MG TABS tablet Take 1 tablet (2.5 mg total) by mouth 2 (two) times daily.   fenofibrate 54 MG tablet TAKE 1 TABLET(54 MG) BY MOUTH DAILY   ferrous sulfate 324 MG TBEC Take 324 mg by mouth daily with breakfast.   fluticasone (FLONASE) 50 MCG/ACT nasal spray Place 1 spray into both nostrils daily. (Patient not taking: Reported on 06/04/2023)   hydrOXYzine (ATARAX) 25 MG tablet TAKE 1 TABLET(25 MG) BY MOUTH EVERY 6 HOURS AS NEEDED   Iron-FA-B Cmp-C-Biot-Probiotic (FUSION PLUS) CAPS Take 1 capsule by mouth daily.   levothyroxine (LEVOXYL) 112 MCG tablet Take 1 tablet (112 mcg total) by mouth daily before breakfast.   meclizine (ANTIVERT) 12.5 MG tablet Take 1 tablet (12.5 mg total) by mouth 2 (two) times daily as needed for dizziness. (Patient not taking: Reported on 10/26/2023)   pantoprazole (PROTONIX) 40 MG tablet TAKE 1 TABLET(40 MG) BY MOUTH DAILY   ranolazine (RANEXA) 500 MG 12 hr tablet Take 1 tablet (500 mg total) by mouth 2 (two) times daily.   rosuvastatin (CRESTOR) 20 MG tablet Take 1 tablet (20 mg total) by mouth daily.   torsemide (DEMADEX) 10 MG tablet TAKE 1 TABLET(10 MG) BY MOUTH DAILY   No facility-administered medications prior to visit.    Review of Systems  Constitutional:  Positive for malaise/fatigue. Negative for chills, fever and weight loss.  HENT: Negative.    Eyes: Negative.   Respiratory: Negative.  Negative for cough and shortness of breath.   Cardiovascular: Negative.  Negative for chest pain, palpitations and leg swelling.  Gastrointestinal: Negative.  Negative for abdominal pain, constipation, diarrhea, heartburn, nausea and vomiting.  Genitourinary: Negative.  Negative for dysuria and flank pain.  Musculoskeletal:  Positive for falls and joint pain. Negative for myalgias.  Skin: Negative.   Neurological:  Positive for dizziness. Negative for headaches.   Endo/Heme/Allergies: Negative.   Psychiatric/Behavioral: Negative.  Negative for depression and suicidal ideas. The patient is not nervous/anxious.        Objective:   BP 108/62   Pulse 68   Ht 5\' 2"  (1.575 m)   Wt 140 lb (63.5 kg)   SpO2 98%   BMI 25.61 kg/m   Vitals:   11/14/23 1434  BP: 108/62  Pulse: 68  Height: 5\' 2"  (1.575 m)  Weight: 140 lb (63.5 kg)  SpO2: 98%  BMI (Calculated): 25.6    Physical Exam Vitals and nursing note reviewed.  Constitutional:      Appearance: Normal appearance.  HENT:     Head: Normocephalic and atraumatic.  Nose: Nose normal.     Mouth/Throat:     Mouth: Mucous membranes are moist.     Pharynx: Oropharynx is clear.  Eyes:     Conjunctiva/sclera: Conjunctivae normal.     Pupils: Pupils are equal, round, and reactive to light.  Cardiovascular:     Rate and Rhythm: Normal rate. Rhythm irregular.     Pulses: Normal pulses.     Heart sounds: Normal heart sounds. No murmur heard. Pulmonary:     Effort: Pulmonary effort is normal.     Breath sounds: Normal breath sounds. No wheezing.  Abdominal:     General: Bowel sounds are normal.     Palpations: Abdomen is soft.     Tenderness: There is no abdominal tenderness. There is no right CVA tenderness or left CVA tenderness.  Musculoskeletal:        General: Normal range of motion.     Cervical back: Normal range of motion.     Right lower leg: No edema.     Left lower leg: No edema.  Skin:    General: Skin is warm and dry.  Neurological:     General: No focal deficit present.     Mental Status: She is alert and oriented to person, place, and time.  Psychiatric:        Mood and Affect: Mood normal.        Behavior: Behavior normal.      No results found for any visits on 11/14/23.  Recent Results (from the past 2160 hours)  Lactic acid, plasma     Status: None   Collection Time: 10/26/23 10:37 AM  Result Value Ref Range   Lactic Acid, Venous 1.0 0.5 - 1.9 mmol/L     Comment: Performed at [facility], [address] [place], [place] [address]  Comprehensive metabolic panel     Status: Abnormal   Collection Time: 10/26/23 10:37 AM  Result Value Ref Range   Sodium 130 (L) 135 - 145 mmol/L   Potassium 4.1 3.5 - 5.1 mmol/L   Chloride 96 (L) 98 - 111 mmol/L   CO2 27 22 - 32 mmol/L   Glucose, Bld 84 70 - 99 mg/dL    Comment: Glucose reference range applies only to samples taken after fasting for at least 8 hours.   BUN 17 8 - 23 mg/dL   Creatinine, Ser 2.84 (H) 0.44 - 1.00 mg/dL   Calcium 8.6 (L) 8.9 - 10.3 mg/dL   Total Protein 6.2 (L) 6.5 - 8.1 g/dL   Albumin 3.6 3.5 - 5.0 g/dL   AST 17 15 - 41 U/L   ALT 9 0 - 44 U/L   Alkaline Phosphatase 42 38 - 126 U/L   Total Bilirubin 0.7 0.0 - 1.2 mg/dL   GFR, Estimated 41 (L) >60 mL/min    Comment: (NOTE) Calculated using the CKD-EPI Creatinine Equation (2021)    Anion gap 7 5 - 15    Comment: Performed at [facility], [address] [place], [place] [address]  CBC with Differential     Status: Abnormal   Collection Time: 10/26/23 10:37 AM  Result Value Ref Range   WBC 7.6 4.0 - 10.5 K/uL   RBC 3.34 (L) 3.87 - 5.11 MIL/uL   Hemoglobin 10.2 (L) 12.0 - 15.0 g/dL   HCT 01.0 (L) 27.2 - 53.6 %   MCV 91.3 80.0 - 100.0 fL   MCH 30.5 26.0 - 34.0 pg   MCHC 33.4 30.0 -  36.0 g/dL   RDW 78.2 95.6 - 21.3 %   Platelets 280 150 - 400 K/uL   nRBC 0.0 0.0 - 0.2 %   Neutrophils Relative % 45 %   Neutro Abs 3.5 1.7 - 7.7 K/uL   Lymphocytes Relative 39 %   Lymphs Abs 3.0 0.7 - 4.0 K/uL   Monocytes Relative 12 %   Monocytes Absolute 0.9 0.1 - 1.0 K/uL   Eosinophils Relative 2 %   Eosinophils Absolute 0.2 0.0 - 0.5 K/uL   Basophils Relative 1 %   Basophils Absolute 0.1 0.0 - 0.1 K/uL   Immature Granulocytes 1 %   Abs Immature Granulocytes 0.04 0.00 - 0.07 K/uL    Comment: Performed at [facility], [address] [place], [place] [address]  Protime-INR     Status: Abnormal    Collection Time: 10/26/23 10:37 AM  Result Value Ref Range   Prothrombin Time 18.3 (H) 11.4 - 15.2 seconds   INR 1.5 (H) 0.8 - 1.2    Comment: (NOTE) INR goal varies based on device and disease states. Performed at [facility], [address] [place], [place] [address]   APTT     Status: None   Collection Time: 10/26/23 10:37 AM  Result Value Ref Range   aPTT 30 24 - 36 seconds    Comment: Performed at [facility], [address] [place], [place] [address]  Urinalysis, w/ Reflex to Culture (Infection Suspected) -Urine, Clean Catch     Status: Abnormal   Collection Time: 10/26/23 10:37 AM  Result Value Ref Range   Specimen Source URINE, CLEAN CATCH    Color, Urine YELLOW (A) YELLOW   APPearance CLEAR (A) CLEAR   Specific Gravity, Urine 1.005 1.005 - 1.030   pH 7.0 5.0 - 8.0   Glucose, UA NEGATIVE NEGATIVE mg/dL   Hgb urine dipstick NEGATIVE NEGATIVE   Bilirubin Urine NEGATIVE NEGATIVE   Ketones, ur NEGATIVE NEGATIVE mg/dL   Protein, ur NEGATIVE NEGATIVE mg/dL   Nitrite NEGATIVE NEGATIVE   Leukocytes,Ua MODERATE (A) NEGATIVE   RBC / HPF 0-5 0 - 5 RBC/hpf   WBC, UA 21-50 0 - 5 WBC/hpf    Comment:        Reflex urine culture not performed if WBC <=10, OR if Squamous epithelial cells >5. If Squamous epithelial cells >5 suggest recollection.    Bacteria, UA RARE (A) NONE SEEN   Squamous Epithelial / HPF 0-5 0 - 5 /HPF    Comment: Performed at [facility], [address] [place], [place] [address]  Resp panel by RT-PCR (RSV, Flu A&B, Covid) Anterior Nasal Swab     Status: None   Collection Time: 10/26/23 10:37 AM   Specimen: Anterior Nasal Swab  Result Value Ref Range   SARS Coronavirus 2 by RT PCR NEGATIVE NEGATIVE    Comment: (NOTE) SARS-CoV-2 target nucleic acids are NOT DETECTED.  The SARS-CoV-2 RNA is generally detectable in upper respiratory specimens during the acute phase of infection. The lowest concentration of SARS-CoV-2 viral  copies this assay can detect is 138 copies/mL. A negative result does not preclude SARS-Cov-2 infection and should not be used as the sole basis for treatment or other patient management decisions. A negative result may occur with  improper specimen collection/handling, submission of specimen other than nasopharyngeal swab, presence of viral mutation(s) within the areas targeted by this assay, and inadequate number of viral copies(<138 copies/mL). A negative result must be combined with clinical observations,  patient history, and epidemiological information. The expected result is Negative.  Fact Sheet for Patients:  BloggerCourse.com  Fact Sheet for Healthcare Providers:  SeriousBroker.it  This test is no t yet approved or cleared by the [place] FDA and  has been authorized for detection and/or diagnosis of SARS-CoV-2 by FDA under an Emergency Use Authorization (EUA). This EUA will remain  in effect (meaning this test can be used) for the duration of the COVID-19 declaration under Section 564(b)(1) of the Act, 21 U.S.C.section 360bbb-3(b)(1), unless the authorization is terminated  or revoked sooner.       Influenza A by PCR NEGATIVE NEGATIVE   Influenza B by PCR NEGATIVE NEGATIVE    Comment: (NOTE) The Xpert Xpress SARS-CoV-2/FLU/RSV plus assay is intended as an aid in the diagnosis of influenza from Nasopharyngeal swab specimens and should not be used as a sole basis for treatment. Nasal washings and aspirates are unacceptable for Xpert Xpress SARS-CoV-2/FLU/RSV testing.  Fact Sheet for Patients: BloggerCourse.com  Fact Sheet for Healthcare Providers: SeriousBroker.it  This test is not yet approved or cleared by the [place] FDA and has been authorized for detection and/or diagnosis of SARS-CoV-2 by FDA under an Emergency Use Authorization (EUA). This EUA will  remain in effect (meaning this test can be used) for the duration of the COVID-19 declaration under Section 564(b)(1) of the Act, 21 U.S.C. section 360bbb-3(b)(1), unless the authorization is terminated or revoked.     Resp Syncytial Virus by PCR NEGATIVE NEGATIVE    Comment: (NOTE) Fact Sheet for Patients: BloggerCourse.com  Fact Sheet for Healthcare Providers: SeriousBroker.it  This test is not yet approved or cleared by the [place] FDA and has been authorized for detection and/or diagnosis of SARS-CoV-2 by FDA under an Emergency Use Authorization (EUA). This EUA will remain in effect (meaning this test can be used) for the duration of the COVID-19 declaration under Section 564(b)(1) of the Act, 21 U.S.C. section 360bbb-3(b)(1), unless the authorization is terminated or revoked.  Performed at [facility], [address] [place], [place] [address]   Troponin I (High Sensitivity)     Status: None   Collection Time: 10/26/23 10:37 AM  Result Value Ref Range   Troponin I (High Sensitivity) 11 <18 ng/L    Comment: (NOTE) Elevated high sensitivity troponin I (hsTnI) values and significant  changes across serial measurements may suggest ACS but many other  chronic and acute conditions are known to elevate hsTnI results.  Refer to the "Links" section for chest pain algorithms and additional  guidance. Performed at [facility], [address], [place], [place] [address]   Urine Culture     Status: Abnormal   Collection Time: 10/26/23 10:37 AM   Specimen: Urine, Random  Result Value Ref Range   Specimen Description      URINE, RANDOM Performed at [facility], [address] [place], [place] [address]    Special Requests      NONE Reflexed from [id number] Performed at [facility], [address] [place], [place] [address]    Culture >=100,000 COLONIES/mL ESCHERICHIA COLI (A)     Report Status 10/28/2023 FINAL    Organism ID, Bacteria ESCHERICHIA COLI (A)       Susceptibility   Escherichia coli - MIC*    AMPICILLIN <=2 SENSITIVE Sensitive     CEFAZOLIN <=4 SENSITIVE Sensitive     CEFEPIME <=0.12 SENSITIVE Sensitive     CEFTRIAXONE <=0.25 SENSITIVE Sensitive  CIPROFLOXACIN <=0.25 SENSITIVE Sensitive     GENTAMICIN <=1 SENSITIVE Sensitive     IMIPENEM <=0.25 SENSITIVE Sensitive     NITROFURANTOIN <=16 SENSITIVE Sensitive     TRIMETH/SULFA <=20 SENSITIVE Sensitive     AMPICILLIN/SULBACTAM <=2 SENSITIVE Sensitive     PIP/TAZO <=4 SENSITIVE Sensitive ug/mL    * >=100,000 COLONIES/mL ESCHERICHIA COLI  Procalcitonin     Status: None   Collection Time: 10/26/23 11:36 AM  Result Value Ref Range   Procalcitonin <0.10 ng/mL    Comment:        Interpretation: PCT (Procalcitonin) <= 0.5 ng/mL: Systemic infection (sepsis) is not likely. Local bacterial infection is possible. (NOTE)       Sepsis PCT Algorithm           Lower Respiratory Tract                                      Infection PCT Algorithm    ----------------------------     ----------------------------         PCT < 0.25 ng/mL                PCT < 0.10 ng/mL          Strongly encourage             Strongly discourage   discontinuation of antibiotics    initiation of antibiotics    ----------------------------     -----------------------------       PCT 0.25 - 0.50 ng/mL            PCT 0.10 - 0.25 ng/mL               OR       >80% decrease in PCT            Discourage initiation of                                            antibiotics      Encourage discontinuation           of antibiotics    ----------------------------     -----------------------------         PCT >= 0.50 ng/mL              PCT 0.26 - 0.50 ng/mL               AND        <80% decrease in PCT             Encourage initiation of                                             antibiotics       Encourage continuation           of  antibiotics    ----------------------------     -----------------------------        PCT >= 0.50 ng/mL                  PCT > 0.50 ng/mL               AND  increase in PCT                  Strongly encourage                                      initiation of antibiotics    Strongly encourage escalation           of antibiotics                                     -----------------------------                                           PCT <= 0.25 ng/mL                                                 OR                                        > 80% decrease in PCT                                      Discontinue / Do not initiate                                             antibiotics  Performed at [facility], [address] [place], [place] [address]   Troponin I (High Sensitivity)     Status: None   Collection Time: 10/26/23 11:37 AM  Result Value Ref Range   Troponin I (High Sensitivity) 11 <18 ng/L    Comment: (NOTE) Elevated high sensitivity troponin I (hsTnI) values and significant  changes across serial measurements may suggest ACS but many other  chronic and acute conditions are known to elevate hsTnI results.  Refer to the "Links" section for chest pain algorithms and additional  guidance. Performed at [facility], [address] [place], [place] [address]   Lactic acid, plasma     Status: None   Collection Time: 10/26/23 12:30 PM  Result Value Ref Range   Lactic Acid, Venous 0.8 0.5 - 1.9 mmol/L    Comment: Performed at [facility], [address] [place], [place] [address]  Basic metabolic panel     Status: Abnormal   Collection Time: 10/27/23  6:24 AM  Result Value Ref Range   Sodium 132 (L) 135 - 145 mmol/L   Potassium 3.9 3.5 - 5.1 mmol/L   Chloride 101 98 - 111 mmol/L   CO2 27 22 - 32 mmol/L   Glucose, Bld 92 70 - 99 mg/dL    Comment: Glucose reference range applies only to samples taken after fasting for at least 8  hours.   BUN 15 8 - 23 mg/dL   Creatinine, Ser 9.14 (H) 0.44 -  1.00 mg/dL   Calcium 8.9 8.9 - 09.8 mg/dL   GFR, Estimated 45 (L) >60 mL/min    Comment: (NOTE) Calculated using the CKD-EPI Creatinine Equation (2021)    Anion gap 4 (L) 5 - 15    Comment: Performed at [facility], [address] [place], [place] [address]  CBC     Status: Abnormal   Collection Time: 10/28/23  4:49 AM  Result Value Ref Range   WBC 7.6 4.0 - 10.5 K/uL   RBC 3.17 (L) 3.87 - 5.11 MIL/uL   Hemoglobin 9.6 (L) 12.0 - 15.0 g/dL   HCT 78.2 (L) 95.6 - 21.3 %   MCV 87.4 80.0 - 100.0 fL   MCH 30.3 26.0 - 34.0 pg   MCHC 34.7 30.0 - 36.0 g/dL   RDW 08.6 57.8 - 46.9 %   Platelets 269 150 - 400 K/uL   nRBC 0.0 0.0 - 0.2 %    Comment: Performed at [facility], [address] [place], [place] [address]  Basic metabolic panel     Status: Abnormal   Collection Time: 10/28/23  4:49 AM  Result Value Ref Range   Sodium 132 (L) 135 - 145 mmol/L   Potassium 4.2 3.5 - 5.1 mmol/L   Chloride 98 98 - 111 mmol/L   CO2 27 22 - 32 mmol/L   Glucose, Bld 86 70 - 99 mg/dL    Comment: Glucose reference range applies only to samples taken after fasting for at least 8 hours.   BUN 18 8 - 23 mg/dL   Creatinine, Ser 8.41 (H) 0.44 - 1.00 mg/dL   Calcium 8.8 (L) 8.9 - 10.3 mg/dL   GFR, Estimated 48 (L) >60 mL/min    Comment: (NOTE) Calculated using the CKD-EPI Creatinine Equation (2021)    Anion gap 7 5 - 15    Comment: Performed at [facility], [address] [place], [place] [address]  POCT Urinalysis Dipstick [phone])     Status: None   Collection Time: 11/08/23 11:36 AM  Result Value Ref Range   Color, UA Yellow    Clarity, UA Clear    Glucose, UA Negative Negative   Bilirubin, UA Negative    Ketones, UA Negative    Spec Grav, UA 1.015 1.010 - 1.025   Blood, UA Trace    pH, UA 7.0 5.0 - 8.0   Protein, UA Negative Negative   Urobilinogen, UA 0.2 0.2 or 1.0 E.U./dL   Nitrite, UA  Negative    Leukocytes, UA Negative Negative   Appearance Clear    Odor No       Assessment & Plan:  Set up Home health with PT . Family will look at SNF, and get back with [place]. Problem List Items Addressed This Visit     Essential hypertension - Primary   Relevant Orders   Ambulatory referral to Home Health   Hypothyroidism, unspecified   Edema of lower leg due to peripheral venous insufficiency   Relevant Orders   Ambulatory referral to Home Health   Osteoarthritis of both knees   Relevant Orders   Ambulatory referral to Home Health   Chronic atrial fibrillation [facility])   Other Visit Diagnoses       Coronary artery disease involving native coronary artery of native heart without angina pectoris           Follow up as scheduled.  Total time spent: 30 minutes  Margaretann Loveless, MD  11/14/2023   This document  may have been prepared by [employer] Voice Recognition software and as such may include unintentional dictation errors.

## 2023-11-20 NOTE — Telephone Encounter (Signed)
 Pts granddaughter called and stated she is requesting a facility rehab for Mrs. Abigail Strong, Pt is unable to stand and her daughter is unable to care for her, the home health hasn't been able to get out there to help her.

## 2023-11-23 ENCOUNTER — Inpatient Hospital Stay
Admission: EM | Admit: 2023-11-23 | Discharge: 2023-11-29 | DRG: 640 | Disposition: A | Discharge status: Skilled Nursing Facility | Attending: Internal Medicine | Admitting: Internal Medicine

## 2023-11-23 ENCOUNTER — Emergency Department

## 2023-11-23 DIAGNOSIS — I440 Atrioventricular block, first degree: Secondary | ICD-10-CM | POA: Diagnosis present

## 2023-11-23 DIAGNOSIS — H8113 Benign paroxysmal vertigo, bilateral: Secondary | ICD-10-CM

## 2023-11-23 DIAGNOSIS — E878 Other disorders of electrolyte and fluid balance, not elsewhere classified: Secondary | ICD-10-CM | POA: Diagnosis present

## 2023-11-23 DIAGNOSIS — F32A Depression, unspecified: Secondary | ICD-10-CM | POA: Diagnosis present

## 2023-11-23 DIAGNOSIS — E871 Hypo-osmolality and hyponatremia: Principal | ICD-10-CM

## 2023-11-23 DIAGNOSIS — Z8619 Personal history of other infectious and parasitic diseases: Secondary | ICD-10-CM

## 2023-11-23 DIAGNOSIS — K219 Gastro-esophageal reflux disease without esophagitis: Secondary | ICD-10-CM | POA: Insufficient documentation

## 2023-11-23 DIAGNOSIS — Z79890 Hormone replacement therapy (postmenopausal): Secondary | ICD-10-CM

## 2023-11-23 DIAGNOSIS — Z882 Allergy status to sulfonamides status: Secondary | ICD-10-CM

## 2023-11-23 DIAGNOSIS — I951 Orthostatic hypotension: Secondary | ICD-10-CM | POA: Diagnosis present

## 2023-11-23 DIAGNOSIS — Z87440 Personal history of urinary (tract) infections: Secondary | ICD-10-CM

## 2023-11-23 DIAGNOSIS — Z90710 Acquired absence of both cervix and uterus: Secondary | ICD-10-CM

## 2023-11-23 DIAGNOSIS — E785 Hyperlipidemia, unspecified: Secondary | ICD-10-CM

## 2023-11-23 DIAGNOSIS — D649 Anemia, unspecified: Secondary | ICD-10-CM | POA: Diagnosis present

## 2023-11-23 DIAGNOSIS — Z66 Do not resuscitate: Secondary | ICD-10-CM | POA: Diagnosis present

## 2023-11-23 DIAGNOSIS — I5033 Acute on chronic diastolic (congestive) heart failure: Secondary | ICD-10-CM

## 2023-11-23 DIAGNOSIS — Z888 Allergy status to other drugs, medicaments and biological substances status: Secondary | ICD-10-CM

## 2023-11-23 DIAGNOSIS — E861 Hypovolemia: Secondary | ICD-10-CM | POA: Diagnosis present

## 2023-11-23 DIAGNOSIS — I130 Hypertensive heart and chronic kidney disease with heart failure and stage 1 through stage 4 chronic kidney disease, or unspecified chronic kidney disease: Secondary | ICD-10-CM | POA: Diagnosis present

## 2023-11-23 DIAGNOSIS — I4891 Unspecified atrial fibrillation: Secondary | ICD-10-CM | POA: Diagnosis present

## 2023-11-23 DIAGNOSIS — Z9889 Other specified postprocedural states: Secondary | ICD-10-CM

## 2023-11-23 DIAGNOSIS — R531 Weakness: Secondary | ICD-10-CM

## 2023-11-23 DIAGNOSIS — Z79899 Other long term (current) drug therapy: Secondary | ICD-10-CM

## 2023-11-23 DIAGNOSIS — Z7901 Long term (current) use of anticoagulants: Secondary | ICD-10-CM

## 2023-11-23 DIAGNOSIS — J449 Chronic obstructive pulmonary disease, unspecified: Secondary | ICD-10-CM | POA: Diagnosis present

## 2023-11-23 DIAGNOSIS — N1831 Chronic kidney disease, stage 3a: Secondary | ICD-10-CM | POA: Diagnosis present

## 2023-11-23 DIAGNOSIS — G4700 Insomnia, unspecified: Secondary | ICD-10-CM | POA: Diagnosis present

## 2023-11-23 DIAGNOSIS — R296 Repeated falls: Secondary | ICD-10-CM | POA: Diagnosis present

## 2023-11-23 DIAGNOSIS — Z8049 Family history of malignant neoplasm of other genital organs: Secondary | ICD-10-CM

## 2023-11-23 DIAGNOSIS — Z9841 Cataract extraction status, right eye: Secondary | ICD-10-CM

## 2023-11-23 DIAGNOSIS — E039 Hypothyroidism, unspecified: Secondary | ICD-10-CM | POA: Insufficient documentation

## 2023-11-23 DIAGNOSIS — Z803 Family history of malignant neoplasm of breast: Secondary | ICD-10-CM

## 2023-11-23 DIAGNOSIS — R55 Syncope and collapse: Principal | ICD-10-CM

## 2023-11-23 DIAGNOSIS — Z9049 Acquired absence of other specified parts of digestive tract: Secondary | ICD-10-CM

## 2023-11-23 DIAGNOSIS — I444 Left anterior fascicular block: Secondary | ICD-10-CM | POA: Diagnosis present

## 2023-11-23 DIAGNOSIS — Z9842 Cataract extraction status, left eye: Secondary | ICD-10-CM

## 2023-11-23 LAB — COMPREHENSIVE METABOLIC PANEL WITH GFR
ALT: 7 U/L (ref 0–44)
AST: 15 U/L (ref 15–41)
Albumin: 3.5 g/dL (ref 3.5–5.0)
Alkaline Phosphatase: 39 U/L (ref 38–126)
Anion gap: 7 (ref 5–15)
BUN: 15 mg/dL (ref 8–23)
CO2: 27 mmol/L (ref 22–32)
Calcium: 8.9 mg/dL (ref 8.9–10.3)
Chloride: 95 mmol/L — ABNORMAL LOW (ref 98–111)
Creatinine, Ser: 1.11 mg/dL — ABNORMAL HIGH (ref 0.44–1.00)
GFR, Estimated: 47 mL/min — ABNORMAL LOW (ref >60)
Glucose, Bld: 79 mg/dL (ref 70–99)
Potassium: 4.3 mmol/L (ref 3.5–5.1)
Sodium: 129 mmol/L — ABNORMAL LOW (ref 135–145)
Total Bilirubin: 0.6 mg/dL (ref 0.0–1.2)
Total Protein: 6.1 g/dL — ABNORMAL LOW (ref 6.5–8.1)

## 2023-11-23 LAB — CBC
HCT: 28.5 % — ABNORMAL LOW (ref 36.0–46.0)
Hemoglobin: 9.6 g/dL — ABNORMAL LOW (ref 12.0–15.0)
MCH: 30.4 pg (ref 26.0–34.0)
MCHC: 33.7 g/dL (ref 30.0–36.0)
MCV: 90.2 fL (ref 80.0–100.0)
Platelets: 285 10*3/uL (ref 150–400)
RBC: 3.16 MIL/uL — ABNORMAL LOW (ref 3.87–5.11)
RDW: 13.5 % (ref 11.5–15.5)
WBC: 8.1 10*3/uL (ref 4.0–10.5)
nRBC: 0 % (ref 0.0–0.2)

## 2023-11-23 LAB — TROPONIN I (HIGH SENSITIVITY): Troponin I (High Sensitivity): 6 ng/L (ref <18)

## 2023-11-23 LAB — BRAIN NATRIURETIC PEPTIDE: B Natriuretic Peptide: 116.6 pg/mL — ABNORMAL HIGH (ref 0.0–100.0)

## 2023-11-23 MED ORDER — SODIUM CHLORIDE 0.9 % IV BOLUS
500.0000 mL | Freq: Once | INTRAVENOUS | Status: AC
  Administered 2023-11-23: 500 mL via INTRAVENOUS

## 2023-11-23 NOTE — ED Provider Notes (Signed)
 [facility] Provider Note    Event Date/Time   First MD Initiated Contact with Patient 11/23/23 2051     (approximate)   History   No chief complaint on file.   HPI  Abigail Strong is a 89 y.o. female past medical history significant for atrial fibrillation, hyperlipidemia, COPD, HFpEF, who presents to the emergency department with weakness.  History is provided by the patient and her daughter over the telephone.  States that she has been having multiple episodes of generalized weakness with dizziness and falls.  Recent hospitalization for an episode of altered mental status.  States that she has been working on getting her placement into an assisted living facility with her primary care physician.  Today had an episode where she was unable to get her to stand or walk.  Significantly worsening weakness when compared to her normal.  States that she has been having some pain to her right leg and feels like there is causing her to have more weakness.  Denies any DVT or PE.  Does state that she has a history of urinary tract infection with a history of ESBL.  States that she was going to have multiple falls today secondary to weakness but her daughter was there to catch her.  Denies any head injury or loss of consciousness.  No chest pain at this time.     Physical Exam   Triage Vital Signs: ED Triage Vitals  Encounter Vitals Group     BP 11/23/23 2059 94/62     Systolic BP Percentile --      Diastolic BP Percentile --      Pulse Rate 11/23/23 2059 74     Resp 11/23/23 2059 18     Temp 11/23/23 2059 98.4 F (36.9 C)     Temp Source 11/23/23 2059 Oral     SpO2 11/23/23 2059 95 %     Weight 11/23/23 2056 140 lb (63.5 kg)     Height 11/23/23 2056 5\' 4"  (1.626 m)     Head Circumference --      Peak Flow --      Pain Score 11/23/23 2055 0     Pain Loc --      Pain Education --      Exclude from Growth Chart --     Most recent vital signs: Vitals:    11/23/23 2059 11/23/23 2101  BP: 94/62 93/67  Pulse: 74 77  Resp: 18 20  Temp: 98.4 F (36.9 C) 98.4 F (36.9 C)  SpO2: 95% 92%    Physical Exam Constitutional:      Appearance: She is well-developed.  HENT:     Head: Atraumatic.  Eyes:     Extraocular Movements: Extraocular movements intact.     Conjunctiva/sclera: Conjunctivae normal.     Pupils: Pupils are equal, round, and reactive to light.  Cardiovascular:     Rate and Rhythm: Regular rhythm.  Pulmonary:     Effort: No respiratory distress.     Breath sounds: No wheezing.  Abdominal:     General: There is no distension.     Tenderness: There is no abdominal tenderness.  Musculoskeletal:        General: Normal range of motion.     Cervical back: Normal range of motion.     Comments: Pitting edema to bilateral lower extremities.  No unilateral leg swelling.  Skin:    General: Skin is warm.     Capillary Refill:  Capillary refill takes less than 2 seconds.  Neurological:     Mental Status: She is alert. Mental status is at baseline.     IMPRESSION / MDM / ASSESSMENT AND PLAN / ED COURSE  I reviewed the triage vital signs and the nursing notes.  Differential diagnosis including dehydration, electrolyte abnormality, heart failure exacerbation, urinary tract infection, intracranial hemorrhage, ACS  EKG  I, Corena Herter, the attending physician, personally viewed and interpreted this ECG.  EKG showed normal sinus rhythm.  T wave inverted to the lead III.  Nonspecific ST changes.  No significant change when compared to prior EKG  No tachycardic or bradycardic dysrhythmias while on cardiac telemetry.  RADIOLOGY I independently reviewed imaging, my interpretation of imaging: No signs of pneumonia  CT scan of the head read as no acute findings.  LABS (all labs ordered are listed, but only abnormal results are displayed) Labs interpreted as -    Labs Reviewed  CBC - Abnormal; Notable for the following  components:      Result Value   RBC 3.16 (*)    Hemoglobin 9.6 (*)    HCT 28.5 (*)    All other components within normal limits  COMPREHENSIVE METABOLIC PANEL WITH GFR - Abnormal; Notable for the following components:   Sodium 129 (*)    Chloride 95 (*)    Creatinine, Ser 1.11 (*)    Total Protein 6.1 (*)    GFR, Estimated 47 (*)    All other components within normal limits  BRAIN NATRIURETIC PEPTIDE - Abnormal; Notable for the following components:   B Natriuretic Peptide 116.6 (*)    All other components within normal limits  URINALYSIS, W/ REFLEX TO CULTURE (INFECTION SUSPECTED)  TROPONIN I (HIGH SENSITIVITY)  TROPONIN I (HIGH SENSITIVITY)     MDM    Patient with nonfocal neurologic exam have low suspicion for CVA.  CT scan of the head with no signs of intracranial hemorrhage.  Given a 500 bolus.  Findings concerning for hyponatremia which is likely in the setting of volume overload giving her pitting edema to her lower extremities.  No findings of significant heart failure on chest x-ray with no pulmonary edema.  Creatinine appears to be at her baseline.  Plan for In-N-Out for urine.  Patient will need to be admitted for generalized weakness and hyponatremia.   PROCEDURES:  Critical Care performed: No  Procedures  Patient's presentation is most consistent with severe exacerbation of chronic illness.   MEDICATIONS ORDERED IN ED: Medications  sodium chloride 0.9 % bolus 500 mL (500 mLs Intravenous Bolus from Bag 11/23/23 2357)    FINAL CLINICAL IMPRESSION(S) / ED DIAGNOSES   Final diagnoses:  Syncope, unspecified syncope type  Weakness  Hyponatremia     Rx / DC Orders   ED Discharge Orders     None        Note:  This document was prepared using Dragon voice recognition software and may include unintentional dictation errors.   Corena Herter, MD 11/24/23 0000

## 2023-11-23 NOTE — ED Triage Notes (Signed)
 Pt arrived via EMS from home. Pt here for generalized weakness. Pt almost fell twice today. Pt normally has weakness but more than usual today.

## 2023-11-23 NOTE — Group Note (Deleted)
 Date:  11/23/2023 Time:  9:53 PM  Group Topic/Focus:  Wrap-Up Group:   The focus of this group is to help patients review their daily goal of treatment and discuss progress on daily workbooks.     Participation Level:  {BHH PARTICIPATION WUJWJ:19147}  Participation Quality:  {BHH PARTICIPATION QUALITY:22265}  Affect:  {BHH AFFECT:22266}  Cognitive:  {BHH COGNITIVE:22267}  Insight: {BHH Insight2:20797}  Engagement in Group:  {BHH ENGAGEMENT IN WGNFA:21308}  Modes of Intervention:  {BHH MODES OF INTERVENTION:22269}  Additional Comments:  ***  Maglione,Angell Honse E 11/23/2023, 9:53 PM

## 2023-11-24 ENCOUNTER — Other Ambulatory Visit: Payer: Self-pay

## 2023-11-24 ENCOUNTER — Encounter: Payer: Self-pay | Admitting: Family Medicine

## 2023-11-24 DIAGNOSIS — E785 Hyperlipidemia, unspecified: Secondary | ICD-10-CM | POA: Diagnosis not present

## 2023-11-24 DIAGNOSIS — E871 Hypo-osmolality and hyponatremia: Secondary | ICD-10-CM

## 2023-11-24 DIAGNOSIS — I5033 Acute on chronic diastolic (congestive) heart failure: Secondary | ICD-10-CM | POA: Diagnosis not present

## 2023-11-24 DIAGNOSIS — R531 Weakness: Secondary | ICD-10-CM | POA: Diagnosis not present

## 2023-11-24 DIAGNOSIS — E039 Hypothyroidism, unspecified: Secondary | ICD-10-CM | POA: Insufficient documentation

## 2023-11-24 DIAGNOSIS — K219 Gastro-esophageal reflux disease without esophagitis: Secondary | ICD-10-CM | POA: Insufficient documentation

## 2023-11-24 LAB — CBC
HCT: 27.8 % — ABNORMAL LOW (ref 36.0–46.0)
Hemoglobin: 9.4 g/dL — ABNORMAL LOW (ref 12.0–15.0)
MCH: 30.5 pg (ref 26.0–34.0)
MCHC: 33.8 g/dL (ref 30.0–36.0)
MCV: 90.3 fL (ref 80.0–100.0)
Platelets: 281 10*3/uL (ref 150–400)
RBC: 3.08 MIL/uL — ABNORMAL LOW (ref 3.87–5.11)
RDW: 13.2 % (ref 11.5–15.5)
WBC: 8.5 10*3/uL (ref 4.0–10.5)
nRBC: 0 % (ref 0.0–0.2)

## 2023-11-24 LAB — URINALYSIS, W/ REFLEX TO CULTURE (INFECTION SUSPECTED)
Bilirubin Urine: NEGATIVE
Glucose, UA: NEGATIVE mg/dL
Ketones, ur: NEGATIVE mg/dL
Leukocytes,Ua: NEGATIVE
Nitrite: NEGATIVE
Protein, ur: NEGATIVE mg/dL
Specific Gravity, Urine: 1.005 (ref 1.005–1.030)
pH: 8 (ref 5.0–8.0)

## 2023-11-24 LAB — BASIC METABOLIC PANEL WITH GFR
Anion gap: 8 (ref 5–15)
BUN: 14 mg/dL (ref 8–23)
CO2: 26 mmol/L (ref 22–32)
Calcium: 9 mg/dL (ref 8.9–10.3)
Chloride: 98 mmol/L (ref 98–111)
Creatinine, Ser: 1.07 mg/dL — ABNORMAL HIGH (ref 0.44–1.00)
GFR, Estimated: 49 mL/min — ABNORMAL LOW (ref >60)
Glucose, Bld: 86 mg/dL (ref 70–99)
Potassium: 3.7 mmol/L (ref 3.5–5.1)
Sodium: 132 mmol/L — ABNORMAL LOW (ref 135–145)

## 2023-11-24 LAB — NA AND K (SODIUM & POTASSIUM), RAND UR
Potassium Urine: 21 mmol/L
Sodium, Ur: 58 mmol/L

## 2023-11-24 LAB — TSH: TSH: 1.329 u[IU]/mL (ref 0.350–4.500)

## 2023-11-24 LAB — OSMOLALITY: Osmolality: 271 mosm/kg — ABNORMAL LOW (ref 275–295)

## 2023-11-24 LAB — OSMOLALITY, URINE: Osmolality, Ur: 206 mosm/kg — ABNORMAL LOW (ref 300–900)

## 2023-11-24 LAB — TROPONIN I (HIGH SENSITIVITY): Troponin I (High Sensitivity): 6 ng/L (ref <18)

## 2023-11-24 MED ORDER — CITALOPRAM HYDROBROMIDE 20 MG PO TABS
20.0000 mg | ORAL_TABLET | Freq: Every day | ORAL | Status: DC
  Administered 2023-11-24 – 2023-11-29 (×6): 20 mg via ORAL
  Filled 2023-11-24 (×6): qty 1

## 2023-11-24 MED ORDER — PANTOPRAZOLE SODIUM 40 MG PO TBEC
40.0000 mg | DELAYED_RELEASE_TABLET | Freq: Every day | ORAL | Status: DC
  Administered 2023-11-24 – 2023-11-29 (×6): 40 mg via ORAL
  Filled 2023-11-24 (×6): qty 1

## 2023-11-24 MED ORDER — ORAL CARE MOUTH RINSE: 15.0000 mL | OROMUCOSAL | Status: DC | PRN

## 2023-11-24 MED ORDER — RANOLAZINE ER 500 MG PO TB12
500.0000 mg | ORAL_TABLET | Freq: Two times a day (BID) | ORAL | Status: DC
  Administered 2023-11-24 – 2023-11-29 (×11): 500 mg via ORAL
  Filled 2023-11-24 (×12): qty 1

## 2023-11-24 MED ORDER — ACETAMINOPHEN 500 MG PO TABS
1000.0000 mg | ORAL_TABLET | Freq: Once | ORAL | Status: AC
  Administered 2023-11-24: 1000 mg via ORAL
  Filled 2023-11-24: qty 2

## 2023-11-24 MED ORDER — ACETAMINOPHEN 325 MG PO TABS
650.0000 mg | ORAL_TABLET | Freq: Four times a day (QID) | ORAL | Status: DC | PRN
  Administered 2023-11-27 – 2023-11-28 (×2): 650 mg via ORAL
  Filled 2023-11-24 (×2): qty 2

## 2023-11-24 MED ORDER — ROSUVASTATIN CALCIUM 20 MG PO TABS
20.0000 mg | ORAL_TABLET | Freq: Every day | ORAL | Status: DC
  Administered 2023-11-24 – 2023-11-29 (×6): 20 mg via ORAL
  Filled 2023-11-24 (×6): qty 1

## 2023-11-24 MED ORDER — ONDANSETRON HCL 4 MG/2ML IJ SOLN: 4.0000 mg | Freq: Four times a day (QID) | INTRAMUSCULAR | Status: DC | PRN

## 2023-11-24 MED ORDER — FUROSEMIDE 10 MG/ML IJ SOLN
40.0000 mg | Freq: Once | INTRAMUSCULAR | Status: AC
  Administered 2023-11-24: 40 mg via INTRAVENOUS
  Filled 2023-11-24: qty 4

## 2023-11-24 MED ORDER — FUSION PLUS PO CAPS: 1.0000 | ORAL_CAPSULE | Freq: Every day | ORAL | Status: DC

## 2023-11-24 MED ORDER — MAGNESIUM HYDROXIDE 400 MG/5ML PO SUSP: 30.0000 mL | Freq: Every day | ORAL | Status: DC | PRN

## 2023-11-24 MED ORDER — FERROUS SULFATE 325 (65 FE) MG PO TABS
324.0000 mg | ORAL_TABLET | Freq: Every day | ORAL | Status: DC
  Administered 2023-11-24 – 2023-11-29 (×6): 324 mg via ORAL
  Filled 2023-11-24 (×6): qty 1

## 2023-11-24 MED ORDER — LEVOTHYROXINE SODIUM 112 MCG PO TABS
112.0000 ug | ORAL_TABLET | Freq: Every day | ORAL | Status: DC
  Administered 2023-11-24 – 2023-11-29 (×6): 112 ug via ORAL
  Filled 2023-11-24 (×6): qty 1

## 2023-11-24 MED ORDER — APIXABAN 2.5 MG PO TABS
2.5000 mg | ORAL_TABLET | Freq: Two times a day (BID) | ORAL | Status: DC
  Administered 2023-11-24 – 2023-11-29 (×11): 2.5 mg via ORAL
  Filled 2023-11-24 (×12): qty 1

## 2023-11-24 MED ORDER — ACETAMINOPHEN 650 MG RE SUPP: 650.0000 mg | Freq: Four times a day (QID) | RECTAL | Status: DC | PRN

## 2023-11-24 MED ORDER — HYDROXYZINE HCL 50 MG PO TABS
25.0000 mg | ORAL_TABLET | Freq: Four times a day (QID) | ORAL | Status: DC | PRN
  Administered 2023-11-24: 25 mg via ORAL
  Filled 2023-11-24: qty 1

## 2023-11-24 MED ORDER — ORAL CARE MOUTH RINSE
15.0000 mL | OROMUCOSAL | Status: DC
  Administered 2023-11-25 – 2023-11-29 (×15): 15 mL via OROMUCOSAL

## 2023-11-24 MED ORDER — FENOFIBRATE 54 MG PO TABS
54.0000 mg | ORAL_TABLET | Freq: Every day | ORAL | Status: DC
  Administered 2023-11-24 – 2023-11-29 (×6): 54 mg via ORAL
  Filled 2023-11-24 (×6): qty 1

## 2023-11-24 MED ORDER — TRAZODONE HCL 50 MG PO TABS
25.0000 mg | ORAL_TABLET | Freq: Every evening | ORAL | Status: DC | PRN
  Administered 2023-11-25 – 2023-11-28 (×4): 25 mg via ORAL
  Filled 2023-11-24 (×4): qty 1

## 2023-11-24 MED ORDER — VITAMIN D 25 MCG (1000 UNIT) PO TABS
2000.0000 [IU] | ORAL_TABLET | Freq: Every day | ORAL | Status: DC
  Administered 2023-11-24 – 2023-11-29 (×6): 2000 [IU] via ORAL
  Filled 2023-11-24 (×6): qty 2

## 2023-11-24 MED ORDER — ENOXAPARIN SODIUM 40 MG/0.4ML IJ SOSY: 40.0000 mg | PREFILLED_SYRINGE | INTRAMUSCULAR | Status: DC

## 2023-11-24 MED ORDER — MORPHINE SULFATE (PF) 2 MG/ML IV SOLN
1.0000 mg | INTRAVENOUS | Status: DC | PRN
  Administered 2023-11-24: 2 mg via INTRAVENOUS
  Filled 2023-11-24: qty 1

## 2023-11-24 MED ORDER — ONDANSETRON HCL 4 MG PO TABS: 4.0000 mg | ORAL_TABLET | Freq: Four times a day (QID) | ORAL | Status: DC | PRN

## 2023-11-24 NOTE — Evaluation (Signed)
 Physical Therapy Evaluation Patient Details Name: Abigail Strong MRN: [id number] DOB: Mar 10, 1930 Today's Date: 11/24/2023  History of Present Illness  Abigail Strong is a 89yoF comes to 11/23/23 acute onset generalized weakness, difficulty walking, multiple recent falls.She has been having right leg pain and has been feeling her legs have been giving way. PMH: GERD hypoTSH, HLD, chronic bilateral knee pain, falls, UTI, COPD, AF, heart failure.  Clinical Impression  Pt having a lot of Right leg pain today, makes mobility difficulty, but in general requires min-modA physical assist for supine to/from EOB- pt reports she typically needs help with this. Pt reports frequent problems with legs buckling on her, causing falls, cites her bilar DJD knees. Banker pt that knee DJD does not typically cause this type of problem. Episodes sound more akin to hypotension related decreases in BLE muscle tone. This correlates well with hypotension on my visit today, as well as asymptomatic orthostasis at EOB. Pt has heart failure diagnosis, today has significant LEE, she reports not sure if she is taking her medications correctly on days that her DTR has to go to work (this includes her diuretic specifically). Unable to assess any transfers/AMB due to hypotension/pain. Suspect some changes are needed with level of assistance and/or home mobility patterns, as pattern of frequent buckling/falls would indicate that household AMB is no longer a safe, best option for mobility. Will continue to follow.   *RN/MD made aware of pressures.   Orthostatic VS for the past 24 hrs:  BP- Lying Pulse- Lying BP- Sitting Pulse- Sitting  11/24/23 1329 (!) 88/49 67 (!) 75/55 79          If plan is discharge home, recommend the following: A lot of help with walking and/or transfers;A lot of help with bathing/dressing/bathroom;Assist for transportation;Assistance with cooking/housework;Help with stairs or ramp for  entrance;Direct supervision/assist for financial management;Direct supervision/assist for medications management   Can travel by private vehicle   No    Equipment Recommendations None recommended by PT  Recommendations for Other Services       Functional Status Assessment Patient has had a recent decline in their functional status and demonstrates the ability to make significant improvements in function in a reasonable and predictable amount of time.     Precautions / Restrictions Precautions Precautions: Fall Restrictions Weight Bearing Restrictions Per Provider Order: No      Mobility  Bed Mobility Overal bed mobility: Needs Assistance Bed Mobility: Supine to Sit, Sit to Supine     Supine to sit: Mod assist Sit to supine: Mod assist   General bed mobility comments: says she feels fine, but BP drops a bit; leg pain ilimiting    Transfers Overall transfer level:  (deferred due to hypotension/leg pain)                      Ambulation/Gait                  Stairs            Wheelchair Mobility     Tilt Bed    Modified Rankin (Stroke Patients Only)       Balance Overall balance assessment: Modified Independent                                           Pertinent Vitals/Pain Pain Assessment Pain Assessment: Faces Faces  Pain Scale: Hurts even more Pain Location: Rt knee    Home Living Family/patient expects to be discharged to:: Private residence Living Arrangements: Children (DTR i sprimary caregiver, but works occasionally 2-3 days per week) Available Help at Discharge: Family Type of Home: House Home Access: Stairs to enter       Home Layout: One level Home Equipment: Agricultural consultant (2 wheels);BSC/3in1      Prior Function Prior Level of Function : History of Falls (last six months);Needs assist             Mobility Comments: Mod I for ambulation with rolling walker, frequent falls reported. very  limited walking, severe chornic knee pain ADLs Comments: daughter assists with self ADLs and IADLs. Pt reports she can wash herself and does her own toileting but needs assistance with all her dressing.     Extremity/Trunk Assessment                Communication   Communication Communication: Impaired Factors Affecting Communication: Hearing impaired    Cognition Arousal: Alert Behavior During Therapy: WFL for tasks assessed/performed (tangentail at times, but also HOH \)   PT - Cognitive impairments: History of cognitive impairments, No family/caregiver present to determine baseline                         Following commands: Intact, Impaired Following commands impaired: Follows one step commands inconsistently     Cueing       General Comments      Exercises     Assessment/Plan    PT Assessment Patient needs continued PT services  PT Problem List Decreased strength;Decreased range of motion;Decreased activity tolerance;Decreased balance;Decreased mobility;Decreased safety awareness;Decreased knowledge of precautions       PT Treatment Interventions DME instruction;Gait training;Stair training;Functional mobility training;Therapeutic activities;Therapeutic exercise;Balance training    PT Goals (Current goals can be found in the Care Plan section)  Acute Rehab PT Goals PT Goal Formulation: Patient unable to participate in goal setting    Frequency Min 2X/week     Co-evaluation               AM-PAC PT "6 Clicks" Mobility  Outcome Measure Help needed turning from your back to your side while in a flat bed without using bedrails?: A Lot Help needed moving from lying on your back to sitting on the side of a flat bed without using bedrails?: A Lot Help needed moving to and from a bed to a chair (including a wheelchair)?: A Lot Help needed standing up from a chair using your arms (e.g., wheelchair or bedside chair)?: A Lot Help needed to walk in  hospital room?: Total Help needed climbing 3-5 steps with a railing? : Total 6 Click Score: 10    End of Session   Activity Tolerance: Patient limited by fatigue;Treatment limited secondary to medical complications (Comment) Patient left: in bed;with call bell/phone within reach Nurse Communication: Mobility status PT Visit Diagnosis: Difficulty in walking, not elsewhere classified (R26.2);Other abnormalities of gait and mobility (R26.89);Repeated falls (R29.6);Muscle weakness (generalized) (M62.81)    Time: 4540-9811 PT Time Calculation (min) (ACUTE ONLY): 23 min   Charges:   PT Evaluation $PT Eval Moderate Complexity: 1 Mod   PT General Charges $$ ACUTE PT VISIT: 1 Visit       3:51 PM, 11/24/23 Rosamaria Lints, PT, DPT Physical Therapist - [facility]  [phone] (ASCOM)    Abigail Strong 11/24/2023,  3:46 PM

## 2023-11-24 NOTE — Assessment & Plan Note (Signed)
-   We will continue Synthroid and check TSH level.

## 2023-11-24 NOTE — ED Notes (Addendum)
 Placed patient on bedpan. Offered to walk/transfer to wheelchair so patient could use the bathroom on a toilet. Patient chose to use bedpan.

## 2023-11-24 NOTE — H&P (Addendum)
 [place]   PATIENT NAME: Abigail Strong    MR#:  [id number]  DATE OF BIRTH:  Jul 16, 1930  DATE OF ADMISSION:  11/23/2023  PRIMARY CARE PHYSICIAN: Margaretann Loveless, MD   Patient is coming from: Home  REQUESTING/REFERRING PHYSICIAN: Ward, Layla Maw, DO  CHIEF COMPLAINT:  Generalized weakness  HISTORY OF PRESENT ILLNESS:  Abigail Strong is a 89 y.o. Caucasian female with medical history significant for GERD, depression, hypertension, dyslipidemia, atrial fibrillation, COPD, HFpEF hypothyroidism and insomnia, who presented to the emergency room with acute onset generalized weakness the patient has been having elevated ambulate and recent multiple falls.  Today she had an episode where she was unable to stand or walk.  She has been having right leg pain and has been feeling her legs have been giving way.  No fever or chills.  No nausea or vomiting or abdominal pain.  No dysuria, oliguria or hematuria or flank pain.  No chest pain or palpitations.  No paresthesias or focal muscle weakness.  She admits to two-pillow orthopnea as well as dyspnea on exertion paroxysmal nocturnal dyspnea.  She has been having dry cough without wheezing.  ED Course: When the patient came to the ER, vital signs were within normal.  Labs revealed hyponatremia of 129 and hypochloremia of 95 with creatinine 1.11, total protein of 6.1 and otherwise unremarkable CMP.  BNP was 116.6.  High-sensitivity troponin was 6 twice.  CBC showed hemoglobin 9.6 and hematocrit 28.5 compared to 10.2 and 30.5 on 10/26/2023.  0-5 WBCs.  EKG as reviewed by me : EKG showed sinus rhythm with rate of 62 with first-degree AV block, left anterior fascicular block and Q waves anteroseptally. Imaging: Portable chest x-ray showed low lung volumes with mild diffuse interstitialLung markings that appear chronic.  The patient was given 20 mg IV Lasix, 500 mL IV normal saline bolus and 1 g of p.o. Tylenol.  She will be admitted to a telemetry  observation bed for further evaluation and management. PAST MEDICAL HISTORY:   Past Medical History:  Diagnosis Date   Depression    GERD (gastroesophageal reflux disease)    Hyperlipidemia    Hyperlipidemia, unspecified 01/01/2014   Hypothyroidism    Insomnia    Musculoskeletal chest pain 09/27/2016    PAST SURGICAL HISTORY:   Past Surgical History:  Procedure Laterality Date   ABDOMINAL HYSTERECTOMY     BREAST BIOPSY Bilateral 90's   benign   cataracts     COLON SURGERY     partial colectomy   intestinal blockage     NISSEN FUNDOPLICATION      SOCIAL HISTORY:   Social History   Tobacco Use   Smoking status: Never   Smokeless tobacco: Never  Substance Use Topics   Alcohol use: No    FAMILY HISTORY:   Family History  Problem Relation Age of Onset   Cervical cancer Mother    Breast cancer Maternal Aunt     DRUG ALLERGIES:   Allergies  Allergen Reactions   Gabapentin Other (See Comments)    Dizziness    Sulfa Antibiotics Other (See Comments)   Sulfonamide Derivatives     REVIEW OF SYSTEMS:   ROS As per history of present illness. All pertinent systems were reviewed above. Constitutional, HEENT, cardiovascular, respiratory, GI, GU, musculoskeletal, neuro, psychiatric, endocrine, integumentary and hematologic systems were reviewed and are otherwise negative/unremarkable except for positive findings mentioned above in the HPI.   MEDICATIONS AT HOME:   Prior  to Admission medications   Medication Sig Start Date End Date Taking? Authorizing Provider  acetaminophen (TYLENOL) 500 MG tablet Take 1,000 mg by mouth every 6 (six) hours as needed for mild pain or moderate pain.    [provider]  Cholecalciferol 50 MCG (2000 UT) CAPS Take 2,000 Units by mouth daily.    [provider]  citalopram (CELEXA) 20 MG tablet Take 1 tablet (20 mg total) by mouth daily. 10/28/23 11/27/23  Charise Killian, MD  ELIQUIS 2.5 MG TABS tablet Take 1 tablet (2.5  mg total) by mouth 2 (two) times daily. 05/28/23   Laurier Nancy, MD  fenofibrate 54 MG tablet TAKE 1 TABLET(54 MG) BY MOUTH DAILY 08/12/23   Margaretann Loveless, MD  ferrous sulfate 324 MG TBEC Take 324 mg by mouth daily with breakfast.    [provider]  fluticasone (FLONASE) 50 MCG/ACT nasal spray Place 1 spray into both nostrils daily. Patient not taking: Reported on 06/04/2023 03/08/23   Margaretann Loveless, MD  hydrOXYzine (ATARAX) 25 MG tablet TAKE 1 TABLET(25 MG) BY MOUTH EVERY 6 HOURS AS NEEDED 09/30/23   Margaretann Loveless, MD  Iron-FA-B Cmp-C-Biot-Probiotic (FUSION PLUS) CAPS Take 1 capsule by mouth daily. 03/20/23   Miki Kins, FNP  levothyroxine (LEVOXYL) 112 MCG tablet Take 1 tablet (112 mcg total) by mouth daily before breakfast. 09/27/23   Margaretann Loveless, MD  meclizine (ANTIVERT) 12.5 MG tablet Take 1 tablet (12.5 mg total) by mouth 2 (two) times daily as needed for dizziness. Patient not taking: Reported on 10/26/2023 03/08/23   Margaretann Loveless, MD  pantoprazole (PROTONIX) 40 MG tablet TAKE 1 TABLET(40 MG) BY MOUTH DAILY 07/11/23   Margaretann Loveless, MD  ranolazine (RANEXA) 500 MG 12 hr tablet Take 1 tablet (500 mg total) by mouth 2 (two) times daily. 07/02/23   Margaretann Loveless, MD  rosuvastatin (CRESTOR) 20 MG tablet Take 1 tablet (20 mg total) by mouth daily. 10/04/22   Margaretann Loveless, MD  torsemide (DEMADEX) 10 MG tablet TAKE 1 TABLET(10 MG) BY MOUTH DAILY 07/22/23   Margaretann Loveless, MD      VITAL SIGNS:  Blood pressure (!) 111/58, pulse 62, temperature 97.7 F (36.5 C), temperature source Oral, resp. rate (!) 21, height 5\' 4"  (1.626 m), weight 63.5 kg, SpO2 100%.  PHYSICAL EXAMINATION:  Physical Exam  GENERAL:  89 y.o.-year-old Caucasian female patient lying in the bed with no acute distress.  EYES: Pupils equal, round, reactive to light and accommodation. No scleral icterus. Extraocular muscles intact.  HEENT: Head atraumatic, normocephalic. Oropharynx and nasopharynx clear.   NECK:  Supple, no jugular venous distention. No thyroid enlargement, no tenderness.  LUNGS: Slightly diminished bibasal breath sounds with no use of accessory muscles of respiration.  CARDIOVASCULAR: Regular rate and rhythm, S1, S2 normal. No murmurs, rubs, or gallops.  ABDOMEN: Soft, nondistended, nontender. Bowel sounds present. No organomegaly or mass.  EXTREMITIES: 1+ bilateral lower extremity pitting edema, with no cyanosis, or clubbing.  NEUROLOGIC: Cranial nerves II through XII are intact. Muscle strength 5/5 in all extremities. Sensation intact. Gait not checked.  PSYCHIATRIC: The patient is alert and oriented x 3.  Normal affect and good eye contact. SKIN: No obvious rash, lesion, or ulcer.   LABORATORY PANEL:   CBC Recent Labs  Lab 11/24/23 0411  WBC 8.5  HGB 9.4*  HCT 27.8*  PLT 281   ------------------------------------------------------------------------------------------------------------------  Chemistries  Recent Labs  Lab 11/23/23 2117 11/24/23  0411  NA 129* 132*  K 4.3 3.7  CL 95* 98  CO2 27 26  GLUCOSE 79 86  BUN 15 14  CREATININE 1.11* 1.07*  CALCIUM 8.9 9.0  AST 15  --   ALT 7  --   ALKPHOS 39  --   BILITOT 0.6  --    ------------------------------------------------------------------------------------------------------------------  Cardiac Enzymes No results for input(s): "TROPONINI" in the last 168 hours. ------------------------------------------------------------------------------------------------------------------  RADIOLOGY:  DG Chest Port 1 View Result Date: 11/23/2023 CLINICAL DATA:  Status post fall. EXAM: PORTABLE CHEST 1 VIEW COMPARISON:  October 26, 2023 FINDINGS: The heart size and mediastinal contours are within normal limits. Low lung volumes are noted with mild, diffuse, chronic appearing increased interstitial lung markings. There is no evidence of acute infiltrate, pleural effusion or pneumothorax. A stable small to moderate sized  hiatal hernia is seen. Multilevel degenerative changes are noted throughout the thoracic spine. IMPRESSION: Low lung volumes with mild, diffuse, chronic appearing increased interstitial lung markings. Electronically Signed   By: Aram Candela M.D.   On: 11/23/2023 22:11   CT Head Wo Contrast Result Date: 11/23/2023 CLINICAL DATA:  Generalized weakness. EXAM: CT HEAD WITHOUT CONTRAST TECHNIQUE: Contiguous axial images were obtained from the base of the skull through the vertex without intravenous contrast. RADIATION DOSE REDUCTION: This exam was performed according to the departmental dose-optimization program which includes automated exposure control, adjustment of the mA and/or kV according to patient size and/or use of iterative reconstruction technique. COMPARISON:  None Available. FINDINGS: Brain: There is mild cerebral atrophy with widening of the extra-axial spaces and ventricular dilatation. There are areas of decreased attenuation within the white matter tracts of the supratentorial brain, consistent with microvascular disease changes. Small, bilateral chronic basal ganglia lacunar infarcts are noted. Vascular: Moderate severity bilateral cavernous carotid artery calcification is noted. Skull: Normal. Negative for fracture or focal lesion. Sinuses/Orbits: No acute finding. Other: None. IMPRESSION: 1. No acute intracranial abnormality. 2. Generalized cerebral atrophy and microvascular disease changes of the supratentorial brain. 3. Small, bilateral chronic basal ganglia lacunar infarcts. Electronically Signed   By: Aram Candela M.D.   On: 11/23/2023 22:08      IMPRESSION AND PLAN:  Assessment and Plan: * Generalized weakness - This is associated with hyponatremia and is likely the culprit for recent recurrent falls. - The patient be admitted to a medical telemetry observation bed. - Will obtain hyponatremia workup. - I will hold off on hydration or diuresis at this time pending workup  for her hyponatremia. - PT consult will be obtained.  Hyponatremia - Management as above.  Hyponatremia workup will be obtained.  Dyslipidemia - Will continue fenofibrate and statin therapy.  Acute on chronic diastolic CHF (congestive heart failure) (HCC) - The patient was given 20 mg of IV Lasix. - Will hold off on further diuresis pending further hyponatremia workup. - Will follow strict I's and O's. - Fluid restriction will be pursued. - Most recent 2D echo revealed on 03/11/2021 an EF of 60 to 65% with grade 1 diastolic dysfunction.  GERD without esophagitis - Will continue PPI therapy.  Hypothyroidism - We will continue Synthroid and check TSH level.  Depression - We will continue same exam and hydroxyzine.   DVT prophylaxis: Lovenox.  Advanced Care Planning:  Code Status: full code.  Family Communication:  The plan of care was discussed in details with the patient (and family). I answered all questions. The patient agreed to proceed with the above mentioned plan. Further management will  depend upon hospital course. Disposition Plan: Back to previous home environment Consults called: none.  All the records are reviewed and case discussed with ED provider.  Status is: Observation  I certify that at the time of admission, it is my clinical judgment that the patient will require  hospital care extending less than 2 midnights.                            Dispo: The patient is from: Home              Anticipated d/c is to: Home              Patient currently is not medically stable to d/c.              Difficult to place patient: No  Hannah Beat M.D on 11/24/2023 at 5:42 AM  [facility]   From 7 PM-7 AM, contact night-coverage [email or web address]  CC: Primary care physician; Margaretann Loveless, MD

## 2023-11-24 NOTE — Assessment & Plan Note (Signed)
 Will continue PPI therapy.

## 2023-11-24 NOTE — Assessment & Plan Note (Addendum)
-   The patient was given 20 mg of IV Lasix. - Will hold off on further diuresis pending further hyponatremia workup. - Will follow strict I's and O's. - Fluid restriction will be pursued. - Most recent 2D echo revealed on 03/11/2021 an EF of 60 to 65% with grade 1 diastolic dysfunction.

## 2023-11-24 NOTE — Progress Notes (Signed)
 PROGRESS NOTE    Abigail Strong  IHK:[id number] DOB: September 01, 1929 DOA: 11/23/2023 PCP: Margaretann Loveless, MD  Chief Complaint  Patient presents with   Weakness    Hospital Course:  Abigail Strong is 89 y.o. female with, depression, hypertension, dyslipidemia, atrial fibrillation, COPD, heart failure with preserved EF, hypothyroidism, insomnia, who presents to the emergency department with acute onset generalized weakness, difficulty with ambulation, multiple falls.  She also endorses two-pillow orthopnea, dyspnea on exertion, and dry cough without wheezing.  In the ED she was found to be hyponatremic to 129, hypochloremic to 95, and BNP 116.  High-sensitivity troponin was unremarkable.  Chest x-ray revealed low lung volumes with mild diffuse interstitial lung markings.  She received 20 mg IV Lasix, as well as a 500 mL IV NS bolus, 1 g of Tylenol, and was admitted for observation.  Subjective: Patient reports feeling very tired this morning.  She has no specific complaints.  She reports she has some concerns about discharging home as she does not believe that she is able to take care of herself independently at this time.   Objective: Vitals:   11/23/23 2101 11/24/23 0205 11/24/23 0617 11/24/23 0630  BP: 93/67 (!) 111/58 100/68 (!) 105/59  Pulse: 77 62 81 64  Resp: 20 (!) 21 (!) 23 20  Temp: 98.4 F (36.9 C) 97.7 F (36.5 C) 98.1 F (36.7 C)   TempSrc: Oral Oral Oral   SpO2: 92% 100% 98% 97%  Weight:      Height:        Intake/Output Summary (Last 24 hours) at 11/24/2023 7564 Last data filed at 11/24/2023 0400 Gross per 24 hour  Intake --  Output 600 ml  Net -600 ml   Filed Weights   11/23/23 2056  Weight: 63.5 kg    Examination: General exam: Appears calm and comfortable, NAD  Respiratory system: No work of breathing, symmetric chest wall expansion Cardiovascular system: S1 & S2 heard, RRR.  Gastrointestinal system: Abdomen is nondistended, soft and nontender.  Neuro:  Alert and oriented. No focal neurological deficits. Extremities: Symmetric, expected ROM Skin: No rashes, lesions Psychiatry: Demonstrates appropriate judgement and insight. Mood & affect appropriate for situation.   Assessment & Plan:  Principal Problem:   Generalized weakness Active Problems:   Hyponatremia   Dyslipidemia   Acute on chronic diastolic CHF (congestive heart failure) (HCC)   Depression   Hypothyroidism   GERD without esophagitis   Generalized weakness Frequent falls - May be secondary to hyponatremia - Hold off further hydration or diuresis at this time pending hyponatremia workup - PT/OT consult, unable to work with physical therapy today due to dizziness.  The patient will need rehab/SNF  Hypotonic hyponatremia - 129 on arrival, improving now - Urine osmos 206, serum osmo 271. Urine NA 58.  Suspect hypovolemia as cause - Continue to monitor.  Encourage p.o. intake.  Dyslipidemia - Continue statin and fenofibrate  Acute on chronic diastolic heart failure - S/p 20 mg IV Lasix in the ED, however patient also received 500 cc IV fluid bolus - Hold off on further diuresis - Follow strict I's and O's - Most recent 2D echo July 2022: EF 60 to 65% with grade 1 diastolic dysfunction.  Repeat echo ordered and pending  GERD without esophagitis - Continue PPI  Hypothyroidism - Continue Synthroid - Check TSH  Depression - Resume home meds  Hypertension - Monitor closely, BP low today.  Orthostatics ordered.  Hold home meds for now  COPD, not currently in exacerbation - No wheezing appreciated.  No oxygen requirement currently - Continue monitor closely. - Home meds  A-fib - Continue home dose Eliquis  Advanced age - Currently lives at home reports she is not able to take care of herself independently.  She has been trying to get home health set up for believe she will need more assistance than this - PT evals as above  DVT prophylaxis: Home dose  Eliquis   Code Status: Limited: Do not attempt resuscitation (DNR) -DNR-LIMITED -Do Not Intubate/DNI  Family Communication:  Discussed directly with patient Disposition: Admitted for observation, generalized weakness.  Unable to work with physical therapy today due to low blood pressure and dizziness.  Anticipate she will need home health at minimum, potentially SNF Consultants:    Procedures:    Antimicrobials:  Anti-infectives (From admission, onward)    None       Data Reviewed: I have personally reviewed following labs and imaging studies CBC: Recent Labs  Lab 11/23/23 2117 11/24/23 0411  WBC 8.1 8.5  HGB 9.6* 9.4*  HCT 28.5* 27.8*  MCV 90.2 90.3  PLT 285 281   Basic Metabolic Panel: Recent Labs  Lab 11/23/23 2117 11/24/23 0411  NA 129* 132*  K 4.3 3.7  CL 95* 98  CO2 27 26  GLUCOSE 79 86  BUN 15 14  CREATININE 1.11* 1.07*  CALCIUM 8.9 9.0   GFR: Estimated Creatinine Clearance: 29.6 mL/min (A) (by C-G formula based on SCr of 1.07 mg/dL (H)). Liver Function Tests: Recent Labs  Lab 11/23/23 2117  AST 15  ALT 7  ALKPHOS 39  BILITOT 0.6  PROT 6.1*  ALBUMIN 3.5   CBG: No results for input(s): "GLUCAP" in the last 168 hours.  No results found for this or any previous visit (from the past 240 hours).   Radiology Studies: DG Chest Port 1 View Result Date: 11/23/2023 CLINICAL DATA:  Status post fall. EXAM: PORTABLE CHEST 1 VIEW COMPARISON:  October 26, 2023 FINDINGS: The heart size and mediastinal contours are within normal limits. Low lung volumes are noted with mild, diffuse, chronic appearing increased interstitial lung markings. There is no evidence of acute infiltrate, pleural effusion or pneumothorax. A stable small to moderate sized hiatal hernia is seen. Multilevel degenerative changes are noted throughout the thoracic spine. IMPRESSION: Low lung volumes with mild, diffuse, chronic appearing increased interstitial lung markings. Electronically Signed    By: Aram Candela M.D.   On: 11/23/2023 22:11   CT Head Wo Contrast Result Date: 11/23/2023 CLINICAL DATA:  Generalized weakness. EXAM: CT HEAD WITHOUT CONTRAST TECHNIQUE: Contiguous axial images were obtained from the base of the skull through the vertex without intravenous contrast. RADIATION DOSE REDUCTION: This exam was performed according to the departmental dose-optimization program which includes automated exposure control, adjustment of the mA and/or kV according to patient size and/or use of iterative reconstruction technique. COMPARISON:  None Available. FINDINGS: Brain: There is mild cerebral atrophy with widening of the extra-axial spaces and ventricular dilatation. There are areas of decreased attenuation within the white matter tracts of the supratentorial brain, consistent with microvascular disease changes. Small, bilateral chronic basal ganglia lacunar infarcts are noted. Vascular: Moderate severity bilateral cavernous carotid artery calcification is noted. Skull: Normal. Negative for fracture or focal lesion. Sinuses/Orbits: No acute finding. Other: None. IMPRESSION: 1. No acute intracranial abnormality. 2. Generalized cerebral atrophy and microvascular disease changes of the supratentorial brain. 3. Small, bilateral chronic basal ganglia lacunar infarcts. Electronically Signed  By: Aram Candela M.D.   On: 11/23/2023 22:08    Scheduled Meds:  apixaban  2.5 mg Oral BID   cholecalciferol  2,000 Units Oral Daily   citalopram  20 mg Oral Daily   fenofibrate  54 mg Oral Daily   ferrous sulfate  324 mg Oral Q breakfast   levothyroxine  112 mcg Oral Q0600   pantoprazole  40 mg Oral Daily   ranolazine  500 mg Oral BID   rosuvastatin  20 mg Oral Daily   Continuous Infusions:   LOS: 0 days  MDM: Patient is high risk for one or more organ failure.  They necessitate ongoing hospitalization for continued IV therapies and subsequent lab monitoring. Total time spent interpreting labs  and vitals, coordinating care amongst consultants and care team members, directly assessing and discussing care with the patient and/or family: 55 min    Debarah Crape, DO [facility]  To contact the attending physician between 7A-7P please use Epic Chat. To contact the covering physician during after hours 7P-7A, please review Amion.   11/24/2023, 9:27 AM   *This document has been created with the assistance of dictation software. Please excuse typographical errors. *

## 2023-11-24 NOTE — Assessment & Plan Note (Signed)
-   We will continue same exam and hydroxyzine.

## 2023-11-24 NOTE — Assessment & Plan Note (Signed)
-   Management as above.  Hyponatremia workup will be obtained.

## 2023-11-24 NOTE — Assessment & Plan Note (Signed)
-   Will continue fenofibrate and statin therapy.

## 2023-11-24 NOTE — Plan of Care (Signed)
  Problem: Education: Goal: Knowledge of General Education information will improve Description: Including pain rating scale, medication(s)/side effects and non-pharmacologic comfort measures 11/24/2023 1716 by Garwin Brothers, RN Outcome: Progressing 11/24/2023 1716 by Garwin Brothers, RN Outcome: Progressing   Problem: Health Behavior/Discharge Planning: Goal: Ability to manage health-related needs will improve Outcome: Progressing   Problem: Clinical Measurements: Goal: Ability to maintain clinical measurements within normal limits will improve Outcome: Progressing Goal: Will remain free from infection Outcome: Progressing Goal: Diagnostic test results will improve Outcome: Progressing Goal: Respiratory complications will improve Outcome: Progressing Goal: Cardiovascular complication will be avoided Outcome: Progressing   Problem: Activity: Goal: Risk for activity intolerance will decrease Outcome: Progressing   Problem: Nutrition: Goal: Adequate nutrition will be maintained Outcome: Progressing   Problem: Coping: Goal: Level of anxiety will decrease Outcome: Progressing   Problem: Elimination: Goal: Will not experience complications related to bowel motility Outcome: Progressing Goal: Will not experience complications related to urinary retention Outcome: Progressing   Problem: Pain Managment: Goal: General experience of comfort will improve and/or be controlled 11/24/2023 1716 by Garwin Brothers, RN Outcome: Progressing 11/24/2023 1716 by Garwin Brothers, RN Outcome: Progressing   Problem: Safety: Goal: Ability to remain free from injury will improve 11/24/2023 1716 by Garwin Brothers, RN Outcome: Progressing 11/24/2023 1716 by Garwin Brothers, RN Outcome: Progressing   Problem: Skin Integrity: Goal: Risk for impaired skin integrity will decrease 11/24/2023 1716 by Garwin Brothers, RN Outcome: Progressing 11/24/2023 1716 by Garwin Brothers,  RN Outcome: Progressing

## 2023-11-24 NOTE — Assessment & Plan Note (Addendum)
-   This is associated with hyponatremia and is likely the culprit for recent recurrent falls. - The patient be admitted to a medical telemetry observation bed. - Will obtain hyponatremia workup. - I will hold off on hydration or diuresis at this time pending workup for her hyponatremia. - PT consult will be obtained.

## 2023-11-24 NOTE — ED Notes (Signed)
PT at bedside assessing patient.

## 2023-11-25 ENCOUNTER — Observation Stay
Admit: 2023-11-25 | Discharge: 2023-11-25 | Disposition: A | Discharge status: Home / Self Care | Attending: Family Medicine

## 2023-11-25 DIAGNOSIS — I5031 Acute diastolic (congestive) heart failure: Secondary | ICD-10-CM | POA: Diagnosis not present

## 2023-11-25 DIAGNOSIS — R531 Weakness: Secondary | ICD-10-CM | POA: Diagnosis not present

## 2023-11-25 DIAGNOSIS — I5033 Acute on chronic diastolic (congestive) heart failure: Secondary | ICD-10-CM | POA: Diagnosis not present

## 2023-11-25 DIAGNOSIS — K219 Gastro-esophageal reflux disease without esophagitis: Secondary | ICD-10-CM | POA: Diagnosis not present

## 2023-11-25 DIAGNOSIS — E871 Hypo-osmolality and hyponatremia: Secondary | ICD-10-CM | POA: Diagnosis not present

## 2023-11-25 LAB — ECHOCARDIOGRAM COMPLETE
AR max vel: 4.92 cm2
AV Area VTI: 5.47 cm2
AV Area mean vel: 4.67 cm2
AV Mean grad: 4 mmHg
AV Peak grad: 6.7 mmHg
Ao pk vel: 1.29 m/s
Area-P 1/2: 2.77 cm2
Height: 64 in
S' Lateral: 2.9 cm
Weight: 2240 [oz_av]

## 2023-11-25 MED ORDER — TORSEMIDE 20 MG PO TABS
10.0000 mg | ORAL_TABLET | Freq: Every day | ORAL | Status: DC
  Administered 2023-11-27: 10 mg via ORAL
  Filled 2023-11-25 (×2): qty 1

## 2023-11-25 MED ORDER — ORAL CARE MOUTH RINSE: 15.0000 mL | OROMUCOSAL | Status: DC | PRN

## 2023-11-25 NOTE — Progress Notes (Signed)
*  PRELIMINARY RESULTS* Echocardiogram 2D Echocardiogram has been performed.  Cristela Blue 11/25/2023, 8:01 AM

## 2023-11-25 NOTE — Progress Notes (Addendum)
 Progress Note   Patient: Abigail Strong:[id number] DOB: 1930/06/30 DOA: 11/23/2023     0 DOS: the patient was seen and examined on 11/25/2023   Brief hospital course: Abigail Strong is a 89 y.o. Caucasian female with medical history significant for GERD, depression, hypertension, dyslipidemia, atrial fibrillation, COPD, HFpEF hypothyroidism and insomnia, who presented to the emergency room with acute onset generalized weakness the patient has been having elevated ambulate and recent multiple falls.  BNP 116.6, Na 129 admitted for generalized weakness, hyponatremia work up.  Assessment and Plan: * Generalized weakness In the setting of advanced age, electrolyte abnormalities. Encourage oral diet, supplements. Hold off on hydration or diuresis for today. Na improved. Trend Na PT/ OT evaluation.  Hypotonic Hyponatremia Possibly in the setting of Torsemide use. Urine osmos 206, serum osmo 271. Urine NA 58.  Na improved. Will continue to trend Na. Resume Diuretic from tomorrow.  Dyslipidemia Continue fenofibrate and statin therapy.  Acute on chronic diastolic CHF (congestive heart failure) (HCC) She got 20 mg of IV Lasix. Does not seem fluid overload. Will follow strict I's and O's. Fluid restriction will be pursued. Resume home dose Torsemide from tomorrow. Most recent 2D echo revealed on 03/11/2021 an EF of 60 to 65% with grade 1 diastolic dysfunction.  GERD without esophagitis Continue PPI therapy.  Hypothyroidism Continue Synthroid, TSH level normal.  Depression Continue Celexa and hydroxyzine.  Hypertension BP lower side, will hold antihypertensives. Check orthostatic vitals.   COPD, not currently in exacerbation Continue monitor closely. Resume Home inhalers   A-fib Heart rate stable. Continue home dose Eliquis.  Chronic Anemia- Stable     Out of bed to chair. Incentive spirometry. Nursing supportive care. Fall, aspiration precautions. Diet:   Diet Orders (From admission, onward)     Start     Ordered   11/24/23 1553  DIET DYS 3 Room service appropriate? Yes; Fluid consistency: Thin  Diet effective now       Question Answer Comment  Room service appropriate? Yes   Fluid consistency: Thin      11/24/23 1553           DVT prophylaxis: apixaban (ELIQUIS) tablet 2.5 mg Start: 11/24/23 1000 apixaban (ELIQUIS) tablet 2.5 mg  Level of care: Telemetry Medical   Code Status: Limited: Do not attempt resuscitation (DNR) -DNR-LIMITED -Do Not Intubate/DNI   Subjective: Patient is seen and examined today morning. She is feeling weak, able to answer me. No dizziness but feels unsteady upon getting out of bed.  Physical Exam: Vitals:   11/25/23 0406 11/25/23 0838 11/25/23 0840 11/25/23 1155  BP: (!) 105/56  91/69 (!) 109/52  Pulse: 78 67 72 72  Resp:  18 18 18   Temp: 98.5 F (36.9 C) 98.3 F (36.8 C) 98.3 F (36.8 C) 98 F (36.7 C)  TempSrc: Oral     SpO2: 95% 95% 93% 98%  Weight:      Height:        General - Elderly weak Caucasian female, no apparent distress HEENT - PERRLA, EOMI, atraumatic head, non tender sinuses. Lung - Clear, basal rales, rhonchi, no wheezes. Heart - S1, S2 heard, no murmurs, rubs, no pedal edema. Abdomen - Soft, non tender, bowel sounds good Neuro - Alert, awake and oriented x 3, non focal exam. Skin - Warm and dry.  Data Reviewed:      Latest Ref Rng & Units 11/24/2023    4:11 AM 11/23/2023    9:17 PM 10/28/2023  4:49 AM  CBC  WBC 4.0 - 10.5 K/uL 8.5  8.1  7.6   Hemoglobin 12.0 - 15.0 g/dL 9.4  9.6  9.6   Hematocrit 36.0 - 46.0 % 27.8  28.5  27.7   Platelets 150 - 400 K/uL 281  285  269       Latest Ref Rng & Units 11/24/2023    4:11 AM 11/23/2023    9:17 PM 10/28/2023    4:49 AM  BMP  Glucose 70 - 99 mg/dL 86  79  86   BUN 8 - 23 mg/dL 14  15  18    Creatinine 0.44 - 1.00 mg/dL 8.11  9.14  7.82   Sodium 135 - 145 mmol/L 132  129  132   Potassium 3.5 - 5.1 mmol/L 3.7  4.3  4.2    Chloride 98 - 111 mmol/L 98  95  98   CO2 22 - 32 mmol/L 26  27  27    Calcium 8.9 - 10.3 mg/dL 9.0  8.9  8.8    ECHOCARDIOGRAM COMPLETE Result Date: 11/25/2023    ECHOCARDIOGRAM REPORT   Patient Name:   Abigail Strong Date of Exam: 11/25/2023 Medical Rec #:  [id number]         Height:       64.0 in Accession #:    [id number]        Weight:       140.0 lb Date of Birth:  01/18/1930        BSA:          1.681 m Patient Age:    89 years          BP:           105/56 mmHg Patient Gender: F                 HR:           78 bpm. Exam Location:  [facility] Procedure: 2D Echo, Cardiac Doppler and Color Doppler (Both Spectral and Color            Flow Doppler were utilized during procedure). Indications:     CHF-acute diastolic I50.31  History:         Patient has prior history of Echocardiogram examinations, most                  recent 03/12/2021. Risk Factors:Dyslipidemia. Hypotyhroidism.  Sonographer:     Cristela Blue Referring Phys:  [id number] Debarah Crape Diagnosing Phys: Adrian Blackwater IMPRESSIONS  1. Left ventricular ejection fraction, by estimation, is 65 to 70%. The left ventricle has normal function. The left ventricle has no regional wall motion abnormalities. Left ventricular diastolic parameters are consistent with Grade I diastolic dysfunction (impaired relaxation).  2. Right ventricular systolic function is normal. The right ventricular size is normal.  3. Left atrial size was mild to moderately dilated.  4. Right atrial size was mild to moderately dilated.  5. The mitral valve is normal in structure. Mild to moderate mitral valve regurgitation. No evidence of mitral stenosis.  6. The aortic valve is normal in structure. Aortic valve regurgitation is mild. Aortic valve sclerosis/calcification is present, without any evidence of aortic stenosis.  7. The inferior vena cava is normal in size with greater than 50% respiratory variability, suggesting right atrial pressure of 3 mmHg. FINDINGS  Left Ventricle: Left  ventricular ejection fraction, by estimation, is 65 to 70%. The left ventricle has normal function. The left ventricle has  no regional wall motion abnormalities. Strain was performed and the global longitudinal strain is indeterminate. The left ventricular internal cavity size was normal in size. There is no left ventricular hypertrophy. Left ventricular diastolic parameters are consistent with Grade I diastolic dysfunction (impaired relaxation). Right Ventricle: The right ventricular size is normal. No increase in right ventricular wall thickness. Right ventricular systolic function is normal. Left Atrium: Left atrial size was mild to moderately dilated. Right Atrium: Right atrial size was mild to moderately dilated. Pericardium: There is no evidence of pericardial effusion. Mitral Valve: The mitral valve is normal in structure. Mild to moderate mitral valve regurgitation. No evidence of mitral valve stenosis. Tricuspid Valve: The tricuspid valve is normal in structure. Tricuspid valve regurgitation is mild . No evidence of tricuspid stenosis. Aortic Valve: The aortic valve is normal in structure. Aortic valve regurgitation is mild. Aortic valve sclerosis/calcification is present, without any evidence of aortic stenosis. Aortic valve mean gradient measures 4.0 mmHg. Aortic valve peak gradient measures 6.7 mmHg. Aortic valve area, by VTI measures 5.47 cm. Pulmonic Valve: The pulmonic valve was normal in structure. Pulmonic valve regurgitation is not visualized. No evidence of pulmonic stenosis. Aorta: The aortic root is normal in size and structure. Venous: The inferior vena cava is normal in size with greater than 50% respiratory variability, suggesting right atrial pressure of 3 mmHg. IAS/Shunts: No atrial level shunt detected by color flow Doppler. Additional Comments: 3D was performed not requiring image post processing on an independent workstation and was indeterminate.  LEFT VENTRICLE PLAX 2D LVIDd:          4.80 cm   Diastology LVIDs:         2.90 cm   LV e' medial:    5.44 cm/s LV PW:         1.20 cm   LV E/e' medial:  10.9 LV IVS:        1.10 cm   LV e' lateral:   9.79 cm/s LVOT diam:     3.15 cm   LV E/e' lateral: 6.0 LV SV:         143 LV SV Index:   85 LVOT Area:     7.79 cm  RIGHT VENTRICLE RV Basal diam:  2.70 cm RV Mid diam:    2.30 cm RV S prime:     11.30 cm/s TAPSE (M-mode): 1.2 cm LEFT ATRIUM             Index        RIGHT ATRIUM          Index LA diam:        3.90 cm 2.32 cm/m   RA Area:     6.77 cm LA Vol (A2C):   80.2 ml 47.70 ml/m  RA Volume:   8.97 ml  5.34 ml/m LA Vol (A4C):   56.7 ml 33.73 ml/m LA Biplane Vol: 69.7 ml 41.46 ml/m  AORTIC VALVE AV Area (Vmax):    4.92 cm AV Area (Vmean):   4.67 cm AV Area (VTI):     5.47 cm AV Vmax:           129.00 cm/s AV Vmean:          88.600 cm/s AV VTI:            0.262 m AV Peak Grad:      6.7 mmHg AV Mean Grad:      4.0 mmHg LVOT Vmax:  81.40 cm/s LVOT Vmean:        53.100 cm/s LVOT VTI:          0.184 m LVOT/AV VTI ratio: 0.70  AORTA Ao Root diam: 3.60 cm MITRAL VALVE               TRICUSPID VALVE MV Area (PHT): 2.77 cm    TR Peak grad:   26.6 mmHg MV Decel Time: 274 msec    TR Vmax:        258.00 cm/s MV E velocity: 59.20 cm/s MV A velocity: 85.90 cm/s  SHUNTS MV E/A ratio:  0.69        Systemic VTI:  0.18 m                            Systemic Diam: 3.15 cm Adrian Blackwater Electronically signed by Adrian Blackwater Signature Date/Time: 11/25/2023/11:11:08 AM    Final    DG Chest Port 1 View Result Date: 11/23/2023 CLINICAL DATA:  Status post fall. EXAM: PORTABLE CHEST 1 VIEW COMPARISON:  October 26, 2023 FINDINGS: The heart size and mediastinal contours are within normal limits. Low lung volumes are noted with mild, diffuse, chronic appearing increased interstitial lung markings. There is no evidence of acute infiltrate, pleural effusion or pneumothorax. A stable small to moderate sized hiatal hernia is seen. Multilevel degenerative changes are noted  throughout the thoracic spine. IMPRESSION: Low lung volumes with mild, diffuse, chronic appearing increased interstitial lung markings. Electronically Signed   By: Aram Candela M.D.   On: 11/23/2023 22:11   CT Head Wo Contrast Result Date: 11/23/2023 CLINICAL DATA:  Generalized weakness. EXAM: CT HEAD WITHOUT CONTRAST TECHNIQUE: Contiguous axial images were obtained from the base of the skull through the vertex without intravenous contrast. RADIATION DOSE REDUCTION: This exam was performed according to the departmental dose-optimization program which includes automated exposure control, adjustment of the mA and/or kV according to patient size and/or use of iterative reconstruction technique. COMPARISON:  None Available. FINDINGS: Brain: There is mild cerebral atrophy with widening of the extra-axial spaces and ventricular dilatation. There are areas of decreased attenuation within the white matter tracts of the supratentorial brain, consistent with microvascular disease changes. Small, bilateral chronic basal ganglia lacunar infarcts are noted. Vascular: Moderate severity bilateral cavernous carotid artery calcification is noted. Skull: Normal. Negative for fracture or focal lesion. Sinuses/Orbits: No acute finding. Other: None. IMPRESSION: 1. No acute intracranial abnormality. 2. Generalized cerebral atrophy and microvascular disease changes of the supratentorial brain. 3. Small, bilateral chronic basal ganglia lacunar infarcts. Electronically Signed   By: Aram Candela M.D.   On: 11/23/2023 22:08    Family Communication: Discussed with patient, Isabelle Course over phone. They understand and agree. All questions answered. Patient want Isabelle Course to be her POA and not her daughter as listed. TOC notified.  Disposition: Status is: Observation The patient remains OBS appropriate and will d/c before 2 midnights.  Planned Discharge Destination: Rehab     Time spent: 37 minutes  Author: Marcelino Duster, MD 11/25/2023 12:09 PM Secure chat 7am to 7pm For on call review [email or web address].

## 2023-11-25 NOTE — Care Management Obs Status (Signed)
 MEDICARE OBSERVATION STATUS NOTIFICATION   Patient Details  Name: Abigail Strong MRN: [id number] Date of Birth: 1930/01/06   Medicare Observation Status Notification Given:       Cristela Blue, CMA 11/25/2023, 10:06 AM

## 2023-11-25 NOTE — Evaluation (Signed)
 Occupational Therapy Evaluation Patient Details Name: Abigail Strong MRN: [id number] DOB: 1930/04/28 Today's Date: 11/25/2023   History of Present Illness   Pt is a 89yo F presented to [facility] with acute onset generalized weakness, difficulty walking, multiple recent falls. She has been having right leg pain and has been feeling her legs have been giving way. PMH: GERD hypoTSH, HLD, chronic bilateral knee pain, falls, UTI, COPD, AF, heart failure.     Clinical Impressions Pt was seen for OT evaluation this date. Prior to hospital admission, pt was living at home with her daughter who works from home, but occasionally goes into work. Per pt, her daughter had hired and aide to [employer] for patient and ensure her safety. Pt was ambulating with 4WW Mod I and performing her ADLs with MOD I, except assist for dressing. However, pt with multiple falls and more recently needing someone with her at all times during mobility.  Pt presents to acute OT demonstrating impaired ADL performance and functional mobility 2/2 weakness, balance deficits, pain and low activity tolerance. She continues to complain of leg pain and weakness. Pt currently requires Min/mod A x1 for bed mobility. Max A for STS from EOB to RW x2 standing trials. Able to lateral step to [facility] using RW with Mod A and cueing for safety. Min A and RW use to maintain standing balance while removing pull-up. Total assist for LB dressing task of doffing/donning pull-up. Able to roll with Min A to pull over hips. Pt endorses fatigue, but was motivated to participate. Orthostatic vitals taken and negative during session today.  Pt would benefit from skilled OT services to address noted impairments and functional limitations (see below for any additional details) in order to maximize safety and independence while minimizing falls risk and caregiver burden. Do anticipate the need for follow up OT services upon acute hospital DC.      If plan is discharge home,  recommend the following:   A lot of help with walking and/or transfers;A lot of help with bathing/dressing/bathroom;Assist for transportation;Help with stairs or ramp for entrance     Functional Status Assessment   Patient has had a recent decline in their functional status and demonstrates the ability to make significant improvements in function in a reasonable and predictable amount of time.     Equipment Recommendations   BSC/3in1;Wheelchair (measurements OT);Other (comment) (defer)     Recommendations for Other Services         Precautions/Restrictions   Precautions Precautions: Fall Recall of Precautions/Restrictions: Intact Restrictions Weight Bearing Restrictions Per Provider Order: No     Mobility Bed Mobility Overal bed mobility: Needs Assistance Bed Mobility: Supine to Sit, Sit to Supine, Rolling Rolling: Min assist   Supine to sit: Mod assist Sit to supine: Mod assist   General bed mobility comments: still complains of leg pain with movement today, but intiates bed mobility requiring Min/Mod A to reach EOB and return to supine with repositioning to center of bed; Min A to roll to pull underwear over hips    Transfers Overall transfer level: Needs assistance Equipment used: Rolling walker (2 wheels) Transfers: Sit to/from Stand Sit to Stand: Max assist           General transfer comment: Max A x1 for STS trials from EOB x2 trials during session with ability to lateral step to [facility] using RW with Mod A; limited tolerance and pt fatigued after standing trials      Balance Overall balance assessment: Needs assistance  Sitting-balance support: Feet supported, Single extremity supported Sitting balance-Leahy Scale: Good     Standing balance support: Bilateral upper extremity supported, Reliant on assistive device for balance Standing balance-Leahy Scale: Poor Standing balance comment: RW and Min/mod A x1                           ADL  either performed or assessed with clinical judgement   ADL Overall ADL's : Needs assistance/impaired                     Lower Body Dressing: Total assistance;Sit to/from stand;Sitting/lateral leans Lower Body Dressing Details (indicate cue type and reason): to doff/don pull up at EOB             Functional mobility during ADLs: Moderate assistance;Rolling walker (2 wheels)       Vision         Perception         Praxis         Pertinent Vitals/Pain Pain Assessment Pain Assessment: Faces Faces Pain Scale: Hurts even more Pain Location: Rt knee Pain Descriptors / Indicators: Aching, Sore Pain Intervention(s): Monitored during session, Repositioned     Extremity/Trunk Assessment Upper Extremity Assessment Upper Extremity Assessment: Generalized weakness   Lower Extremity Assessment Lower Extremity Assessment: Generalized weakness;RLE deficits/detail RLE Deficits / Details: knee pain       Communication     Cognition                                             Cueing  General Comments      leg pain and limited activity tolerance; orthostatic vitals taken and negative with no repors of dizziness   Exercises Other Exercises Other Exercises: Edu on role of OT in acute setting.   Shoulder Instructions      Home Living Family/patient expects to be discharged to:: Private residence Living Arrangements: Children (DTR i sprimary caregiver, but works occasionally 2-3 days per week) Available Help at Discharge: Family Type of Home: House Home Access: Stairs to enter     Home Layout: One level     Bathroom Shower/Tub: Sponge bathes at baseline         Home Equipment: Agricultural consultant (2 wheels);BSC/3in1          Prior Functioning/Environment Prior Level of Function : History of Falls (last six months);Needs assist             Mobility Comments: Mod I for ambulation with rolling walker, frequent falls reported.  very limited walking, severe chornic knee pain ADLs Comments: daughter assists with self ADLs and IADLs. Pt reports she can wash herself and does her own toileting but needs assistance with all her dressing.    OT Problem List: Decreased strength;Decreased activity tolerance;Impaired balance (sitting and/or standing);Pain   OT Treatment/Interventions: Self-care/ADL training;Therapeutic exercise;Balance training;Therapeutic activities;DME and/or AE instruction;Energy conservation;Patient/family education      OT Goals(Current goals can be found in the care plan section)   Acute Rehab OT Goals Patient Stated Goal: improve function and pain OT Goal Formulation: With patient Time For Goal Achievement: 12/09/23 Potential to Achieve Goals: Fair ADL Goals Pt Will Perform Upper Body Bathing: sitting;with set-up Pt Will Perform Lower Body Bathing: with min assist;sitting/lateral leans;sit to/from stand Pt Will Perform Lower Body Dressing: with min assist;sit  to/from stand;sitting/lateral leans;with contact guard assist Pt Will Transfer to Toilet: with min assist;ambulating;bedside commode;regular height toilet;stand pivot transfer   OT Frequency:  Min 2X/week    Co-evaluation              AM-PAC OT "6 Clicks" Daily Activity     Outcome Measure Help from another person eating meals?: None Help from another person taking care of personal grooming?: A Little Help from another person toileting, which includes using toliet, bedpan, or urinal?: A Lot Help from another person bathing (including washing, rinsing, drying)?: A Lot Help from another person to put on and taking off regular upper body clothing?: A Little Help from another person to put on and taking off regular lower body clothing?: A Lot 6 Click Score: 16   End of Session Equipment Utilized During Treatment: Rolling walker (2 wheels);Gait belt Nurse Communication: Mobility status  Activity Tolerance: Patient tolerated  treatment well Patient left: in bed;with call bell/phone within reach;with bed alarm set  OT Visit Diagnosis: Other abnormalities of gait and mobility (R26.89);Unsteadiness on feet (R26.81);Muscle weakness (generalized) (M62.81);History of falling (Z91.81)                Time: 2956-2130 OT Time Calculation (min): 31 min Charges:  OT General Charges $OT Visit: 1 Visit OT Evaluation $OT Eval Moderate Complexity: 1 Mod OT Treatments $Self Care/Home Management : 8-22 mins Deakin Lacek, OTR/L  11/25/23, 2:26 PM  Matti Killingsworth E Adelaido Nicklaus 11/25/2023, 2:23 PM

## 2023-11-25 NOTE — Care Management Obs Status (Signed)
 MEDICARE OBSERVATION STATUS NOTIFICATION   Patient Details  Name: Abigail Strong MRN: [id number] Date of Birth: [birth year]   Medicare Observation Status Notification Given:  Orland Dec, CMA 11/25/2023, 10:09 AM

## 2023-11-25 NOTE — Plan of Care (Signed)
 Patient alert and oriented. No acute distress noted. No complaints voiced. All needs attended to. Will continue to monitor.   Problem: Clinical Measurements: Goal: Will remain free from infection Outcome: Progressing Goal: Diagnostic test results will improve Outcome: Progressing Goal: Respiratory complications will improve Outcome: Progressing   Problem: Coping: Goal: Level of anxiety will decrease Outcome: Progressing   Problem: Elimination: Goal: Will not experience complications related to bowel motility Outcome: Progressing   Problem: Pain Managment: Goal: General experience of comfort will improve and/or be controlled Outcome: Progressing   Problem: Safety: Goal: Ability to remain free from injury will improve Outcome: Progressing   Problem: Skin Integrity: Goal: Risk for impaired skin integrity will decrease Outcome: Progressing

## 2023-11-25 NOTE — TOC Initial Note (Addendum)
 Transition of Care [facility]) - Initial/Assessment Note    Patient Details  Name: Abigail Strong MRN: [id number] Date of Birth: 06-20-1930  Transition of Care [facility]) CM/SW Contact:    Cherre Blanc, RN Phone Number: 11/25/2023, 11:41 AM  Clinical Narrative:                 Patient is unable to answer questions. TOC spoke with the patient's grandaughter, Abigail Strong [phone]. Patient lives with her daughter at home. TOC explained that PT is recommending STR. Per [name] they would like [facility]. TOC explained that [facility] is not INN with the patient's insurance. The families other choices would be [facility] or [facility]    TOC reviewed the patient's chart and the patient was recently dc'd to home w/ [facility] PT/OT services via [place].  TOC will continue to follow        Patient Goals and CMS Choice            Expected Discharge Plan and Services                                              Prior Living Arrangements/Services                       Activities of Daily Living   ADL Screening (condition at time of admission) Independently performs ADLs?: No Does the patient have a NEW difficulty with bathing/dressing/toileting/self-feeding that is expected to last >3 days?: No Does the patient have a NEW difficulty with getting in/out of bed, walking, or climbing stairs that is expected to last >3 days?: No Does the patient have a NEW difficulty with communication that is expected to last >3 days?: No Is the patient deaf or have difficulty hearing?: Yes Does the patient have difficulty seeing, even when wearing glasses/contacts?: No Does the patient have difficulty concentrating, remembering, or making decisions?: No  Permission Sought/Granted                  Emotional Assessment              Admission diagnosis:  Hyponatremia [E87.1] Weakness [R53.1] Generalized weakness [R53.1] Syncope, unspecified syncope type [R55] Patient Active  Problem List   Diagnosis Date Noted   Generalized weakness 11/24/2023   Hyponatremia 11/24/2023   Acute on chronic diastolic CHF (congestive heart failure) (HCC) 11/24/2023   Dyslipidemia 11/24/2023   Hypothyroidism 11/24/2023   GERD without esophagitis 11/24/2023   Urinary tract infection 10/26/2023   Head injury 06/04/2023   SAH (subarachnoid hemorrhage) (HCC) 06/04/2023   SDH (subdural hematoma) (HCC) 06/04/2023   Seasonal allergic rhinitis due to pollen 03/08/2023   Benign positional vertigo, bilateral 03/08/2023   Nausea 03/08/2023   Chronic atrial fibrillation (HCC) 10/04/2022   CHF (congestive heart failure), NYHA class III, acute on chronic, diastolic (HCC) 09/21/2022   Acute metabolic encephalopathy 03/09/2021   Acute lower UTI 03/09/2021   Skin tear of lower leg without complication, left, initial encounter 01/19/2021   Memory loss 01/19/2021   Breast pain, left 11/17/2020   Right shoulder pain 09/01/2020   Disorder of rotator cuff, left 09/01/2020   Cervical spine arthritis 07/21/2020   Neuralgia of chest 07/21/2020   Annual physical exam 05/17/2020   Need for influenza vaccination 05/17/2020   Osteoarthritis of both knees 04/26/2020   Dizzinesses 01/21/2020   Swelling  of limb 01/12/2020   Bruit of right carotid artery 01/12/2020   Edema of lower leg due to peripheral venous insufficiency 12/28/2019   Hip pain 12/25/2018   Partial small bowel obstruction (HCC) 09/28/2018   Arthritis of knee 02/19/2017   COPD exacerbation (HCC) 09/27/2016   Pneumonia 09/27/2016   Influenza A 09/27/2016   Prediabetes 09/27/2016   Leukocytosis 09/27/2016   Chronic fatigue, unspecified 09/27/2016   Acute respiratory failure with hypoxia (HCC) 09/22/2016   Chronic bronchitis (HCC) 09/09/2016   Osteoarthritis of knee 03/23/2016   Diverticulitis 03/23/2015   Depression 01/01/2014   GERD (gastroesophageal reflux disease) 01/01/2014   Mixed hyperlipidemia 01/01/2014    Hypothyroidism, unspecified 01/01/2014   OA (osteoarthritis) 01/01/2014   Essential hypertension 12/08/2009   COUGH 12/08/2009   PCP:  Margaretann Loveless, MD Pharmacy:   [id number] - [place], [place] - [address] AT [facility] OF [address] [place] [address] Phone: [phone] Fax: [phone]     Social Drivers of Health (SDOH) Social History: SDOH Screenings   Food Insecurity: No Food Insecurity (11/24/2023)  Housing: Low Risk  (11/24/2023)  Transportation Needs: No Transportation Needs (11/24/2023)  Utilities: Not At Risk (11/24/2023)  Alcohol Screen: Low Risk  (05/04/2021)  Depression (PHQ2-9): Low Risk  (05/04/2021)  Financial Resource Strain: High Risk (05/04/2021)  Physical Activity: Inactive (05/04/2021)  Social Connections: Unknown (11/24/2023)  Stress: No Stress Concern Present (05/04/2021)  Tobacco Use: Low Risk  (11/24/2023)   SDOH Interventions:     Readmission Risk Interventions     No data to display

## 2023-11-26 DIAGNOSIS — I5033 Acute on chronic diastolic (congestive) heart failure: Secondary | ICD-10-CM | POA: Diagnosis not present

## 2023-11-26 DIAGNOSIS — K219 Gastro-esophageal reflux disease without esophagitis: Secondary | ICD-10-CM | POA: Diagnosis not present

## 2023-11-26 DIAGNOSIS — E871 Hypo-osmolality and hyponatremia: Secondary | ICD-10-CM | POA: Diagnosis not present

## 2023-11-26 DIAGNOSIS — R531 Weakness: Secondary | ICD-10-CM | POA: Diagnosis not present

## 2023-11-26 MED ORDER — POLYETHYLENE GLYCOL 3350 17 G PO PACK: 17.0000 g | PACK | Freq: Every day | ORAL | Status: DC

## 2023-11-26 MED ORDER — POLYETHYLENE GLYCOL 3350 17 G PO PACK
17.0000 g | PACK | ORAL | Status: DC
  Administered 2023-11-26: 17 g via ORAL
  Filled 2023-11-26 (×2): qty 1

## 2023-11-26 NOTE — Progress Notes (Signed)
 Progress Note   Patient: Abigail Strong:[id number] DOB: 11-Feb-1930 DOA: 11/23/2023     0 DOS: the patient was seen and examined on 11/26/2023   Brief hospital course: Abigail Strong is a 89 y.o. Caucasian female with medical history significant for GERD, depression, hypertension, dyslipidemia, atrial fibrillation, COPD, HFpEF hypothyroidism and insomnia, who presented to the emergency room with acute onset generalized weakness the patient has been having elevated ambulate and recent multiple falls.  BNP 116.6, Na 129 admitted for generalized weakness, hyponatremia work up.  Assessment and Plan: * Generalized weakness In the setting of advanced age, electrolyte abnormalities. Encourage oral diet, supplements. Hold off on hydration or diuresis for another day. PT/ OT evaluation advised SNF. She can go to SNF tomorrow per TOC.  Hypotonic Hyponatremia Possibly in the setting of Torsemide use. Urine osmos 206, serum osmo 271. Urine NA 58.  Na improved. Will continue to trend Na. Resume Diuretic from tomorrow.  Dyslipidemia Continue fenofibrate and statin therapy.  Acute on chronic diastolic CHF (congestive heart failure) (HCC) 2D echo revealed on 03/11/2021 an EF of 60 to 65% with grade 1 diastolic dysfunction. She got 20 mg of IV Lasix. Does not seem fluid overload. Will follow strict I's and O's. Fluid restriction. Resume home dose Torsemide from tomorrow.  GERD without esophagitis Continue PPI therapy.  Hypothyroidism Continue Synthroid, TSH level normal.  Depression Continue Celexa and hydroxyzine.  Hypertension BP lower side, will hold antihypertensives. Check orthostatic vitals.   COPD, not currently in exacerbation Continue monitor closely. Resume Home inhalers   A-fib Heart rate stable. Continue home dose Eliquis.  Chronic Anemia- Stable     Out of bed to chair. Incentive spirometry. Nursing supportive care. Fall, aspiration precautions. Diet:   Diet Orders (From admission, onward)     Start     Ordered   11/24/23 1553  DIET DYS 3 Room service appropriate? Yes; Fluid consistency: Thin  Diet effective now       Question Answer Comment  Room service appropriate? Yes   Fluid consistency: Thin      11/24/23 1553           DVT prophylaxis: apixaban (ELIQUIS) tablet 2.5 mg Start: 11/24/23 1000 apixaban (ELIQUIS) tablet 2.5 mg  Level of care: Telemetry Medical   Code Status: Limited: Do not attempt resuscitation (DNR) -DNR-LIMITED -Do Not Intubate/DNI   Subjective: Patient is seen and examined today morning. She is lying in bed, able to answer me. Feels weak, eating fair.  Physical Exam: Vitals:   11/26/23 0047 11/26/23 0428 11/26/23 0801 11/26/23 1128  BP: (!) 95/50 (!) 92/55 (!) 100/57 (!) 87/54  Pulse: 78 68 66 68  Resp: 18 18 15 17   Temp: 97.7 F (36.5 C) 98.5 F (36.9 C) 99.2 F (37.3 C) 97.9 F (36.6 C)  TempSrc:      SpO2: 90%  94% 96%  Weight:      Height:        General - Elderly weak Caucasian female, no apparent distress HEENT - PERRLA, EOMI, atraumatic head, non tender sinuses. Lung - Clear, basal rales, rhonchi, no wheezes. Heart - S1, S2 heard, no murmurs, rubs, no pedal edema. Abdomen - Soft, non tender, bowel sounds good Neuro - Alert, awake and oriented x 3, non focal exam. Skin - Warm and dry.  Data Reviewed:      Latest Ref Rng & Units 11/24/2023    4:11 AM 11/23/2023    9:17 PM 10/28/2023    4:49  AM  CBC  WBC 4.0 - 10.5 K/uL 8.5  8.1  7.6   Hemoglobin 12.0 - 15.0 g/dL 9.4  9.6  9.6   Hematocrit 36.0 - 46.0 % 27.8  28.5  27.7   Platelets 150 - 400 K/uL 281  285  269       Latest Ref Rng & Units 11/24/2023    4:11 AM 11/23/2023    9:17 PM 10/28/2023    4:49 AM  BMP  Glucose 70 - 99 mg/dL 86  79  86   BUN 8 - 23 mg/dL 14  15  18    Creatinine 0.44 - 1.00 mg/dL 1.61  0.96  0.45   Sodium 135 - 145 mmol/L 132  129  132   Potassium 3.5 - 5.1 mmol/L 3.7  4.3  4.2   Chloride 98 - 111 mmol/L  98  95  98   CO2 22 - 32 mmol/L 26  27  27    Calcium 8.9 - 10.3 mg/dL 9.0  8.9  8.8    ECHOCARDIOGRAM COMPLETE Result Date: 11/25/2023    ECHOCARDIOGRAM REPORT   Patient Name:   Abigail Strong Date of Exam: 11/25/2023 Medical Rec #:  [id number]         Height:       64.0 in Accession #:    [id number]        Weight:       140.0 lb Date of Birth:  April 24, 1930        BSA:          1.681 m Patient Age:    89 years          BP:           105/56 mmHg Patient Gender: F                 HR:           78 bpm. Exam Location:  [facility] Procedure: 2D Echo, Cardiac Doppler and Color Doppler (Both Spectral and Color            Flow Doppler were utilized during procedure). Indications:     CHF-acute diastolic I50.31  History:         Patient has prior history of Echocardiogram examinations, most                  recent 03/12/2021. Risk Factors:Dyslipidemia. Hypotyhroidism.  Sonographer:     Cristela Blue Referring Phys:  [id number] Debarah Crape Diagnosing Phys: Adrian Blackwater IMPRESSIONS  1. Left ventricular ejection fraction, by estimation, is 65 to 70%. The left ventricle has normal function. The left ventricle has no regional wall motion abnormalities. Left ventricular diastolic parameters are consistent with Grade I diastolic dysfunction (impaired relaxation).  2. Right ventricular systolic function is normal. The right ventricular size is normal.  3. Left atrial size was mild to moderately dilated.  4. Right atrial size was mild to moderately dilated.  5. The mitral valve is normal in structure. Mild to moderate mitral valve regurgitation. No evidence of mitral stenosis.  6. The aortic valve is normal in structure. Aortic valve regurgitation is mild. Aortic valve sclerosis/calcification is present, without any evidence of aortic stenosis.  7. The inferior vena cava is normal in size with greater than 50% respiratory variability, suggesting right atrial pressure of 3 mmHg. FINDINGS  Left Ventricle: Left ventricular ejection fraction,  by estimation, is 65 to 70%. The left ventricle has normal function. The left ventricle has no  regional wall motion abnormalities. Strain was performed and the global longitudinal strain is indeterminate. The left ventricular internal cavity size was normal in size. There is no left ventricular hypertrophy. Left ventricular diastolic parameters are consistent with Grade I diastolic dysfunction (impaired relaxation). Right Ventricle: The right ventricular size is normal. No increase in right ventricular wall thickness. Right ventricular systolic function is normal. Left Atrium: Left atrial size was mild to moderately dilated. Right Atrium: Right atrial size was mild to moderately dilated. Pericardium: There is no evidence of pericardial effusion. Mitral Valve: The mitral valve is normal in structure. Mild to moderate mitral valve regurgitation. No evidence of mitral valve stenosis. Tricuspid Valve: The tricuspid valve is normal in structure. Tricuspid valve regurgitation is mild . No evidence of tricuspid stenosis. Aortic Valve: The aortic valve is normal in structure. Aortic valve regurgitation is mild. Aortic valve sclerosis/calcification is present, without any evidence of aortic stenosis. Aortic valve mean gradient measures 4.0 mmHg. Aortic valve peak gradient measures 6.7 mmHg. Aortic valve area, by VTI measures 5.47 cm. Pulmonic Valve: The pulmonic valve was normal in structure. Pulmonic valve regurgitation is not visualized. No evidence of pulmonic stenosis. Aorta: The aortic root is normal in size and structure. Venous: The inferior vena cava is normal in size with greater than 50% respiratory variability, suggesting right atrial pressure of 3 mmHg. IAS/Shunts: No atrial level shunt detected by color flow Doppler. Additional Comments: 3D was performed not requiring image post processing on an independent workstation and was indeterminate.  LEFT VENTRICLE PLAX 2D LVIDd:         4.80 cm   Diastology LVIDs:          2.90 cm   LV e' medial:    5.44 cm/s LV PW:         1.20 cm   LV E/e' medial:  10.9 LV IVS:        1.10 cm   LV e' lateral:   9.79 cm/s LVOT diam:     3.15 cm   LV E/e' lateral: 6.0 LV SV:         143 LV SV Index:   85 LVOT Area:     7.79 cm  RIGHT VENTRICLE RV Basal diam:  2.70 cm RV Mid diam:    2.30 cm RV S prime:     11.30 cm/s TAPSE (M-mode): 1.2 cm LEFT ATRIUM             Index        RIGHT ATRIUM          Index LA diam:        3.90 cm 2.32 cm/m   RA Area:     6.77 cm LA Vol (A2C):   80.2 ml 47.70 ml/m  RA Volume:   8.97 ml  5.34 ml/m LA Vol (A4C):   56.7 ml 33.73 ml/m LA Biplane Vol: 69.7 ml 41.46 ml/m  AORTIC VALVE AV Area (Vmax):    4.92 cm AV Area (Vmean):   4.67 cm AV Area (VTI):     5.47 cm AV Vmax:           129.00 cm/s AV Vmean:          88.600 cm/s AV VTI:            0.262 m AV Peak Grad:      6.7 mmHg AV Mean Grad:      4.0 mmHg LVOT Vmax:         81.40  cm/s LVOT Vmean:        53.100 cm/s LVOT VTI:          0.184 m LVOT/AV VTI ratio: 0.70  AORTA Ao Root diam: 3.60 cm MITRAL VALVE               TRICUSPID VALVE MV Area (PHT): 2.77 cm    TR Peak grad:   26.6 mmHg MV Decel Time: 274 msec    TR Vmax:        258.00 cm/s MV E velocity: 59.20 cm/s MV A velocity: 85.90 cm/s  SHUNTS MV E/A ratio:  0.69        Systemic VTI:  0.18 m                            Systemic Diam: 3.15 cm Adrian Blackwater Electronically signed by Adrian Blackwater Signature Date/Time: 11/25/2023/11:11:08 AM    Final     Family Communication: Discussed with patient, Lydia over phone. They understand and agree. All questions answered.   Disposition: Status is: Observation The patient remains OBS appropriate and will d/c before 2 midnights.  Planned Discharge Destination: Rehab     Time spent: 38 minutes  Author: Marcelino Duster, MD 11/26/2023 3:00 PM Secure chat 7am to 7pm For on call review [email or web address].

## 2023-11-26 NOTE — Progress Notes (Signed)
 Physical Therapy Treatment Patient Details Name: Abigail Strong MRN: [id number] DOB: 02/06/1930 Today's Date: 11/26/2023   History of Present Illness Pt is a 89yo F presented to [facility] with acute onset generalized weakness, difficulty walking, multiple recent falls. She has been having right leg pain and has been feeling her legs have been giving way. PMH: GERD hypoTSH, HLD, chronic bilateral knee pain, falls, UTI, COPD, AF, heart failure.    PT Comments  Pt is making gradual progress towards goals with ability to attempt standing in order to change brief. Able to perform seated there-ex while at bedside. Pt very grateful for session. Will continue to progress as able.    If plan is discharge home, recommend the following: A lot of help with walking and/or transfers;A lot of help with bathing/dressing/bathroom;Assist for transportation;Assistance with cooking/housework;Help with stairs or ramp for entrance;Direct supervision/assist for financial management;Direct supervision/assist for medications management   Can travel by private vehicle     No  Equipment Recommendations  None recommended by PT    Recommendations for Other Services       Precautions / Restrictions Precautions Precautions: Fall Recall of Precautions/Restrictions: Intact Restrictions Weight Bearing Restrictions Per Provider Order: No     Mobility  Bed Mobility Overal bed mobility: Needs Assistance Bed Mobility: Supine to Sit, Sit to Supine, Rolling     Supine to sit: Mod assist Sit to supine: Mod assist   General bed mobility comments: needs assist for B LE and when returned to bed, needed use of bed controls and assist from PT for repositioning    Transfers Overall transfer level: Needs assistance Equipment used: Rolling walker (2 wheels) Transfers: Sit to/from Stand Sit to Stand: Max assist           General transfer comment: pt very anxious, poor standing tolerance <1 minute. Able to perform 2  trials in order to change brief    Ambulation/Gait               General Gait Details: not safe   Stairs             Wheelchair Mobility     Tilt Bed    Modified Rankin (Stroke Patients Only)       Balance Overall balance assessment: Needs assistance Sitting-balance support: Feet supported, Single extremity supported Sitting balance-Leahy Scale: Good     Standing balance support: Bilateral upper extremity supported, Reliant on assistive device for balance Standing balance-Leahy Scale: Poor                              Communication Communication Communication: Impaired Factors Affecting Communication: Hearing impaired  Cognition Arousal: Alert Behavior During Therapy: WFL for tasks assessed/performed   PT - Cognitive impairments: History of cognitive impairments, No family/caregiver present to determine baseline                       PT - Cognition Comments: very pleasant and able to carry conversation Following commands: Intact Following commands impaired: Only follows one step commands consistently    Cueing Cueing Techniques: Verbal cues  Exercises Other Exercises Other Exercises: scap squeezes x 8 reps and breathing techniques performed while seated at EOB. Good tolerance and control of anxiety Other Exercises: able to stand twice to change wet depends, needs max assist    General Comments        Pertinent Vitals/Pain Pain Assessment Pain Assessment: No/denies pain  Home Living                          Prior Function            PT Goals (current goals can now be found in the care plan section) Acute Rehab PT Goals Patient Stated Goal: to go to rehab PT Goal Formulation: Patient unable to participate in goal setting Progress towards PT goals: Progressing toward goals    Frequency    Min 2X/week      PT Plan      Co-evaluation              AM-PAC PT "6 Clicks" Mobility   Outcome  Measure  Help needed turning from your back to your side while in a flat bed without using bedrails?: A Lot Help needed moving from lying on your back to sitting on the side of a flat bed without using bedrails?: A Lot Help needed moving to and from a bed to a chair (including a wheelchair)?: A Lot Help needed standing up from a chair using your arms (e.g., wheelchair or bedside chair)?: A Lot Help needed to walk in hospital room?: Total Help needed climbing 3-5 steps with a railing? : Total 6 Click Score: 10    End of Session Equipment Utilized During Treatment: Gait belt Activity Tolerance: Patient limited by fatigue;Treatment limited secondary to medical complications (Comment) Patient left: in bed;with call bell/phone within reach;with bed alarm set Nurse Communication: Mobility status PT Visit Diagnosis: Difficulty in walking, not elsewhere classified (R26.2);Other abnormalities of gait and mobility (R26.89);Repeated falls (R29.6);Muscle weakness (generalized) (M62.81)     Time: 7846-9629 PT Time Calculation (min) (ACUTE ONLY): 25 min  Charges:    $Therapeutic Exercise: 8-22 mins $Therapeutic Activity: 8-22 mins PT General Charges $$ ACUTE PT VISIT: 1 Visit                     Cheralyn Palau, PT, DPT, GCS [phone]    Abigail Strong 11/26/2023, 5:10 PM

## 2023-11-26 NOTE — TOC Progression Note (Signed)
 Transition of Care [facility]) - Progression Note    Patient Details  Name: Abigail Strong MRN: [id number] Date of Birth: 04-14-1930  [facility]) CM/SW Contact  Cherre Blanc, RN Phone Number: 11/26/2023, 11:21 AM  Clinical Narrative:    Received call from Gena at [facility] with a bed offer. The bed will be available tomorrow.  Insurance auth requested and received.   Auth ID: [id number]   TOC spoke with the patients daughter, Jasmine December [phone],  and advised of the bed offer.   TOC will continue to follow         Expected Discharge Plan and Services                                               Social Determinants of Health (SDOH) Interventions SDOH Screenings   Food Insecurity: No Food Insecurity (11/24/2023)  Housing: Low Risk  (11/24/2023)  Transportation Needs: No Transportation Needs (11/24/2023)  Utilities: Not At Risk (11/24/2023)  Alcohol Screen: Low Risk  (05/04/2021)  Depression (PHQ2-9): Low Risk  (05/04/2021)  Financial Resource Strain: High Risk (05/04/2021)  Physical Activity: Inactive (05/04/2021)  Social Connections: Unknown (11/24/2023)  Stress: No Stress Concern Present (05/04/2021)  Tobacco Use: Low Risk  (11/24/2023)    Readmission Risk Interventions     No data to display

## 2023-11-26 NOTE — TOC Progression Note (Signed)
 Transition of Care [facility]) - Progression Note    Patient Details  Name: Abigail Strong MRN: [id number] Date of Birth: 1930-05-04  Transition of Care [facility]) CM/SW Contact  Cherre Blanc, RN Phone Number: 11/26/2023, 3:24 PM  Clinical Narrative:     The patient has bed offers at [facility], [facility], and [facility].  TOC spoke with patient's grand daughter, [name] [phone] and sister, Bonita Quin [phone] and explained STR vs LTC beds. TOC shared the patient can use the STR days while the family decides on the long-term plan.   TOC sent an email to the patient financial navigator, Vivi Martens, to assist family with the Medicaid application.  TOC provided [name] with the number for Danielle with [employer] to assist with Longterm placement.  TOC will continue to follow       Expected Discharge Plan and Services                                               Social Determinants of Health (SDOH) Interventions SDOH Screenings   Food Insecurity: No Food Insecurity (11/24/2023)  Housing: Low Risk  (11/24/2023)  Transportation Needs: No Transportation Needs (11/24/2023)  Utilities: Not At Risk (11/24/2023)  Alcohol Screen: Low Risk  (05/04/2021)  Depression (PHQ2-9): Low Risk  (05/04/2021)  Financial Resource Strain: High Risk (05/04/2021)  Physical Activity: Inactive (05/04/2021)  Social Connections: Unknown (11/24/2023)  Stress: No Stress Concern Present (05/04/2021)  Tobacco Use: Low Risk  (11/24/2023)    Readmission Risk Interventions     No data to display

## 2023-11-27 DIAGNOSIS — I5033 Acute on chronic diastolic (congestive) heart failure: Secondary | ICD-10-CM | POA: Diagnosis not present

## 2023-11-27 DIAGNOSIS — K219 Gastro-esophageal reflux disease without esophagitis: Secondary | ICD-10-CM | POA: Diagnosis not present

## 2023-11-27 DIAGNOSIS — R531 Weakness: Secondary | ICD-10-CM | POA: Diagnosis not present

## 2023-11-27 DIAGNOSIS — E871 Hypo-osmolality and hyponatremia: Secondary | ICD-10-CM | POA: Diagnosis not present

## 2023-11-27 LAB — CBC
HCT: 28.5 % — ABNORMAL LOW (ref 36.0–46.0)
Hemoglobin: 10 g/dL — ABNORMAL LOW (ref 12.0–15.0)
MCH: 30.2 pg (ref 26.0–34.0)
MCHC: 35.1 g/dL (ref 30.0–36.0)
MCV: 86.1 fL (ref 80.0–100.0)
Platelets: 300 10*3/uL (ref 150–400)
RBC: 3.31 MIL/uL — ABNORMAL LOW (ref 3.87–5.11)
RDW: 13.2 % (ref 11.5–15.5)
WBC: 8.5 10*3/uL (ref 4.0–10.5)
nRBC: 0 % (ref 0.0–0.2)

## 2023-11-27 LAB — BASIC METABOLIC PANEL WITH GFR
Anion gap: 7 (ref 5–15)
BUN: 16 mg/dL (ref 8–23)
CO2: 25 mmol/L (ref 22–32)
Calcium: 8.8 mg/dL — ABNORMAL LOW (ref 8.9–10.3)
Chloride: 95 mmol/L — ABNORMAL LOW (ref 98–111)
Creatinine, Ser: 1.03 mg/dL — ABNORMAL HIGH (ref 0.44–1.00)
GFR, Estimated: 51 mL/min — ABNORMAL LOW (ref >60)
Glucose, Bld: 74 mg/dL (ref 70–99)
Potassium: 4.2 mmol/L (ref 3.5–5.1)
Sodium: 127 mmol/L — ABNORMAL LOW (ref 135–145)

## 2023-11-27 MED ORDER — SODIUM CHLORIDE 0.9 % IV SOLN: INTRAVENOUS | Status: AC

## 2023-11-27 MED ORDER — BENZONATATE 100 MG PO CAPS: 100.0000 mg | ORAL_CAPSULE | Freq: Three times a day (TID) | ORAL | Status: DC | PRN

## 2023-11-27 NOTE — TOC Transition Note (Signed)
 Transition of Care [facility]) - Discharge Note   Patient Details  Name: Abigail Strong MRN: [id number] Date of Birth: 10/19/1929  Transition of Care [facility]) CM/SW Contact:  Cherre Blanc, RN Phone Number: 11/27/2023, 11:38 AM   Clinical Narrative:    Patient is medically clear for discharge to [facility] for short term inpatient rehab. The patient and granddaughter Abigail Strong [phone] are agreeable with the discharge plan. Transportation to the facility arranged with [facility].  TOC spoke with Ricky at [facility] to provide [facility] [phone]  Nurse to Call Report to [phone]   Final next level of care: Skilled Nursing Facility Barriers to Discharge: SNF Pending bed offer   Patient Goals and CMS Choice            Discharge Placement              Patient chooses bed at: Other - please specify in the comment section below: ([facility]) Patient to be transferred to facility by: [facility] Name of family member notified: Abigail Strong Patient and family notified of of transfer: 11/27/23  Discharge Plan and Services Additional resources added to the After Visit Summary for                                       Social Drivers of Health (SDOH) Interventions SDOH Screenings   Food Insecurity: No Food Insecurity (11/24/2023)  Housing: Low Risk  (11/24/2023)  Transportation Needs: No Transportation Needs (11/24/2023)  Utilities: Not At Risk (11/24/2023)  Alcohol Screen: Low Risk  (05/04/2021)  Depression (PHQ2-9): Low Risk  (05/04/2021)  Financial Resource Strain: High Risk (05/04/2021)  Physical Activity: Inactive (05/04/2021)  Social Connections: Unknown (11/24/2023)  Stress: No Stress Concern Present (05/04/2021)  Tobacco Use: Low Risk  (11/24/2023)     Readmission Risk Interventions     No data to display

## 2023-11-27 NOTE — NC FL2 (Signed)
 [place] MEDICAID FL2 LEVEL OF CARE FORM     IDENTIFICATION  Patient Name: Abigail Strong Birthdate: October 21, 1929 Sex: female Admission Date (Current Location): 11/23/2023  [facility] and [place] Number:  Chiropodist and Address:  [facility], [address] [place], [place] [address]      Provider Number: [id number]  Attending Physician Name and Address:  Marcelino Duster, MD  Relative Name and Phone Number:  [name] [phone]    Current Level of Care: Hospital Recommended Level of Care: Skilled Nursing Facility Prior Approval Number:    Date Approved/Denied:   PASRR Number: [id number]  Discharge Plan: SNF    Current Diagnoses: Patient Active Problem List   Diagnosis Date Noted   Generalized weakness 11/24/2023   Hyponatremia 11/24/2023   Acute on chronic diastolic CHF (congestive heart failure) (HCC) 11/24/2023   Dyslipidemia 11/24/2023   Hypothyroidism 11/24/2023   GERD without esophagitis 11/24/2023   Urinary tract infection 10/26/2023   Head injury 06/04/2023   SAH (subarachnoid hemorrhage) (HCC) 06/04/2023   SDH (subdural hematoma) (HCC) 06/04/2023   Seasonal allergic rhinitis due to pollen 03/08/2023   Benign positional vertigo, bilateral 03/08/2023   Nausea 03/08/2023   Chronic atrial fibrillation (HCC) 10/04/2022   CHF (congestive heart failure), NYHA class III, acute on chronic, diastolic (HCC) 09/21/2022   Acute metabolic encephalopathy 03/09/2021   Acute lower UTI 03/09/2021   Skin tear of lower leg without complication, left, initial encounter 01/19/2021   Memory loss 01/19/2021   Breast pain, left 11/17/2020   Right shoulder pain 09/01/2020   Disorder of rotator cuff, left 09/01/2020   Cervical spine arthritis 07/21/2020   Neuralgia of chest 07/21/2020   Annual physical exam 05/17/2020   Need for influenza vaccination 05/17/2020   Osteoarthritis of both knees 04/26/2020   Dizzinesses 01/21/2020    Swelling of limb 01/12/2020   Bruit of right carotid artery 01/12/2020   Edema of lower leg due to peripheral venous insufficiency 12/28/2019   Hip pain 12/25/2018   Partial small bowel obstruction (HCC) 09/28/2018   Arthritis of knee 02/19/2017   COPD exacerbation (HCC) 09/27/2016   Pneumonia 09/27/2016   Influenza A 09/27/2016   Prediabetes 09/27/2016   Leukocytosis 09/27/2016   Chronic fatigue, unspecified 09/27/2016   Acute respiratory failure with hypoxia (HCC) 09/22/2016   Chronic bronchitis (HCC) 09/09/2016   Osteoarthritis of knee 03/23/2016   Diverticulitis 03/23/2015   Depression 01/01/2014   GERD (gastroesophageal reflux disease) 01/01/2014   Mixed hyperlipidemia 01/01/2014   Hypothyroidism, unspecified 01/01/2014   OA (osteoarthritis) 01/01/2014   Essential hypertension 12/08/2009   COUGH 12/08/2009    Orientation RESPIRATION BLADDER Height & Weight     Self    Incontinent Weight: 63.5 kg Height:  5\' 4"  (162.6 cm)  BEHAVIORAL SYMPTOMS/MOOD NEUROLOGICAL BOWEL NUTRITION STATUS      Incontinent Diet (Dysphagia 3)  AMBULATORY STATUS COMMUNICATION OF NEEDS Skin   Limited Assist                           Personal Care Assistance Level of Assistance  Bathing, Feeding, Dressing Bathing Assistance: Limited assistance Feeding assistance: Limited assistance Dressing Assistance: Limited assistance     Functional Limitations Info  Sight, Hearing, Speech Sight Info: Impaired Hearing Info: Impaired Speech Info: Impaired    SPECIAL CARE FACTORS FREQUENCY  PT (By licensed PT), OT (By licensed OT)     PT Frequency: 5 x week OT Frequency: 5 x week  Contractures      Additional Factors Info  Code Status, Allergies Code Status Info: DNR Allergies Info: Gabapentin, Sulfa, Sulfanomide Derivatives           Current Medications (11/27/2023):  This is the current hospital active medication list Current Facility-Administered Medications   Medication Dose Route Frequency Provider Last Rate Last Admin   acetaminophen (TYLENOL) tablet 650 mg  650 mg Oral Q6H PRN Mansy, Jan A, MD       Or   acetaminophen (TYLENOL) suppository 650 mg  650 mg Rectal Q6H PRN Mansy, Jan A, MD       apixaban Everlene Balls) tablet 2.5 mg  2.5 mg Oral BID Mansy, Jan A, MD   2.5 mg at 11/27/23 1610   cholecalciferol (VITAMIN D3) 25 MCG (1000 UNIT) tablet 2,000 Units  2,000 Units Oral Daily Mansy, Jan A, MD   2,000 Units at 11/27/23 0834   citalopram (CELEXA) tablet 20 mg  20 mg Oral Daily Mansy, Jan A, MD   20 mg at 11/27/23 9604   fenofibrate tablet 54 mg  54 mg Oral Daily Mansy, Jan A, MD   54 mg at 11/27/23 5409   ferrous sulfate tablet 324 mg  324 mg Oral Q breakfast Mansy, Jan A, MD   324 mg at 11/27/23 0834   hydrOXYzine (ATARAX) tablet 25 mg  25 mg Oral Q6H PRN Mansy, Jan A, MD   25 mg at 11/24/23 2124   levothyroxine (SYNTHROID) tablet 112 mcg  112 mcg Oral Q0600 Mansy, Jan A, MD   112 mcg at 11/27/23 0631   magnesium hydroxide (MILK OF MAGNESIA) suspension 30 mL  30 mL Oral Daily PRN Mansy, Jan A, MD       morphine (PF) 2 MG/ML injection 1-2 mg  1-2 mg Intravenous Q4H PRN Mansy, Jan A, MD   2 mg at 11/24/23 0317   ondansetron (ZOFRAN) tablet 4 mg  4 mg Oral Q6H PRN Mansy, Jan A, MD       Or   ondansetron [facility]) injection 4 mg  4 mg Intravenous Q6H PRN Mansy, Vernetta Honey, MD       Oral care mouth rinse  15 mL Mouth Rinse 4 times per day Dezii, Gordy Councilman, DO   15 mL at 11/27/23 8119   Oral care mouth rinse  15 mL Mouth Rinse PRN Debarah Crape, DO       Oral care mouth rinse  15 mL Mouth Rinse PRN Dezii, Alexandra, DO       pantoprazole (PROTONIX) EC tablet 40 mg  40 mg Oral Daily Mansy, Jan A, MD   40 mg at 11/27/23 1478   polyethylene glycol (MIRALAX / GLYCOLAX) packet 17 g  17 g Oral Johny Chess, Lynne Logan, MD   17 g at 11/26/23 1950   ranolazine (RANEXA) 12 hr tablet 500 mg  500 mg Oral BID Mansy, Jan A, MD   500 mg at 11/27/23 0834    rosuvastatin (CRESTOR) tablet 20 mg  20 mg Oral Daily Mansy, Jan A, MD   20 mg at 11/27/23 2956   torsemide (DEMADEX) tablet 10 mg  10 mg Oral Daily Marcelino Duster, MD   10 mg at 11/27/23 0834   traZODone (DESYREL) tablet 25 mg  25 mg Oral QHS PRN Mansy, Jan A, MD   25 mg at 11/26/23 2129     Discharge Medications: Please see discharge summary for a list of discharge medications.  Relevant Imaging Results:  Relevant Lab Results:   Additional  Information [id number]  Cherre Blanc, RN

## 2023-11-27 NOTE — Progress Notes (Signed)
 Progress Note   Patient: Abigail Strong ZOX:[id number] DOB: July 03, 1930 DOA: 11/23/2023     0 DOS: the patient was seen and examined on 11/27/2023   Brief hospital course: Abigail Strong is a 89 y.o. Caucasian female with medical history significant for GERD, depression, hypertension, dyslipidemia, atrial fibrillation, COPD, HFpEF hypothyroidism and insomnia, who presented to the emergency room with acute onset generalized weakness the patient has been having elevated ambulate and recent multiple falls.  BNP 116.6, Na 129 admitted for generalized weakness, hyponatremia work up.  Assessment and Plan: * Generalized weakness In the setting of advanced age, electrolyte abnormalities. Encourage oral diet, supplements. Hold off on hydration or diuresis for another day. PT/ OT evaluated her, she was orthostatic, with dizziness. Will hold dc today.  Hypotonic Hyponatremia Possibly in the setting of Torsemide use. Very gentle IV hydration ordered due to orthostasis. Na 127 today. Continue to trend Na.  Dyslipidemia Continue fenofibrate and statin therapy.  Acute on chronic diastolic CHF (congestive heart failure) (HCC) 2D echo revealed on 03/11/2021 an EF of 60 to 65% with grade 1 diastolic dysfunction. She got 20 mg of IV Lasix. Does not seem fluid overload. Will follow strict I's and O's. Resumed home dose Torsemide.  GERD without esophagitis Continue PPI therapy.  Hypothyroidism Continue Synthroid, TSH level normal.  Depression Continue Celexa and hydroxyzine.  Hypertension Orthostatic hypotension-  Gentle IV fluids. Hold antihypertensives.   COPD, not currently in exacerbation Continue monitor closely. Resume Home inhalers   A-fib Heart rate stable. Continue home dose Eliquis.  Chronic Anemia- Stable     Out of bed to chair. Incentive spirometry. Nursing supportive care. Fall, aspiration precautions. Diet:  Diet Orders (From admission, onward)     Start      Ordered   11/24/23 1553  DIET DYS 3 Room service appropriate? Yes; Fluid consistency: Thin  Diet effective now       Question Answer Comment  Room service appropriate? Yes   Fluid consistency: Thin      11/24/23 1553           DVT prophylaxis: apixaban (ELIQUIS) tablet 2.5 mg Start: 11/24/23 1000 apixaban (ELIQUIS) tablet 2.5 mg  Level of care: Telemetry Medical   Code Status: Limited: Do not attempt resuscitation (DNR) -DNR-LIMITED -Do Not Intubate/DNI   Subjective: Patient is seen and examined today morning. She is lying in bed, able to answer me. PT worked with her, was noted to be orthostatic.  Physical Exam: Vitals:   11/26/23 1951 11/27/23 0459 11/27/23 0734 11/27/23 1522  BP: 114/63 (!) 104/56 103/65 (!) 96/57  Pulse: 70 74 73 69  Resp: 18 17 18 17   Temp: 97.7 F (36.5 C) 98.4 F (36.9 C) 97.8 F (36.6 C) 97.9 F (36.6 C)  TempSrc:   Oral Oral  SpO2: 92% 90% 96% 94%  Weight:      Height:        General - Elderly weak Caucasian female, no apparent distress HEENT - PERRLA, EOMI, atraumatic head, non tender sinuses. Lung - Clear, basal rales, rhonchi, no wheezes. Heart - S1, S2 heard, no murmurs, rubs, no pedal edema. Abdomen - Soft, non tender, bowel sounds good Neuro - Alert, awake and oriented x 3, non focal exam. Skin - Warm and dry.  Data Reviewed:      Latest Ref Rng & Units 11/27/2023    4:39 AM 11/24/2023    4:11 AM 11/23/2023    9:17 PM  CBC  WBC 4.0 - 10.5  K/uL 8.5  8.5  8.1   Hemoglobin 12.0 - 15.0 g/dL 01.0  9.4  9.6   Hematocrit 36.0 - 46.0 % 28.5  27.8  28.5   Platelets 150 - 400 K/uL 300  281  285       Latest Ref Rng & Units 11/27/2023    4:39 AM 11/24/2023    4:11 AM 11/23/2023    9:17 PM  BMP  Glucose 70 - 99 mg/dL 74  86  79   BUN 8 - 23 mg/dL 16  14  15    Creatinine 0.44 - 1.00 mg/dL 2.72  5.36  6.44   Sodium 135 - 145 mmol/L 127  132  129   Potassium 3.5 - 5.1 mmol/L 4.2  3.7  4.3   Chloride 98 - 111 mmol/L 95  98  95   CO2 22 -  32 mmol/L 25  26  27    Calcium 8.9 - 10.3 mg/dL 8.8  9.0  8.9    No results found.   Family Communication: Discussed with patient, Bonita Quin over phone. They understand and agree. All questions answered.   Disposition: Status is: Observation The patient remains OBS appropriate and will d/c before 2 midnights.  Planned Discharge Destination: Rehab     Time spent: 39 minutes  Author: Marcelino Duster, MD 11/27/2023 5:21 PM Secure chat 7am to 7pm For on call review [email or web address].

## 2023-11-27 NOTE — TOC Progression Note (Signed)
 Transition of Care [facility]) - Progression Note    Patient Details  Name: Abigail Strong MRN: [id number] Date of Birth: September 07, 1929  Transition of Care [facility]) CM/SW Contact  Cherre Blanc, RN Phone Number: 11/27/2023, 9:55 AM  Clinical Narrative:     Received request from nurse to call patient and family in the room. Per the patient's granddaughter [name] [phone] the family changed their mind and would like for the patient to go to [facility].  TOC called Ricy at [facility] [phone] and left message making him aware that the patient would like to accept the bed. Provided Ins Auth ID: [id number].  TOC will continue to follow.       Expected Discharge Plan and Services                                               Social Determinants of Health (SDOH) Interventions SDOH Screenings   Food Insecurity: No Food Insecurity (11/24/2023)  Housing: Low Risk  (11/24/2023)  Transportation Needs: No Transportation Needs (11/24/2023)  Utilities: Not At Risk (11/24/2023)  Alcohol Screen: Low Risk  (05/04/2021)  Depression (PHQ2-9): Low Risk  (05/04/2021)  Financial Resource Strain: High Risk (05/04/2021)  Physical Activity: Inactive (05/04/2021)  Social Connections: Unknown (11/24/2023)  Stress: No Stress Concern Present (05/04/2021)  Tobacco Use: Low Risk  (11/24/2023)    Readmission Risk Interventions     No data to display

## 2023-11-27 NOTE — Progress Notes (Signed)
 Physical Therapy Treatment Patient Details Name: Abigail Strong MRN: [id number] DOB: 1930-01-12 Today's Date: 11/27/2023   History of Present Illness Pt is a 89yo F presented to [facility] with acute onset generalized weakness, difficulty walking, multiple recent falls. She has been having right leg pain and has been feeling her legs have been giving way. PMH: GERD hypoTSH, HLD, chronic bilateral knee pain, falls, UTI, COPD, AF, heart failure.    PT Comments  Pt agreeable to limited session, noted severe hypotension per chart. Agreeable to supine there-ex and is hopeful to dc to SNF this date. Pt reports she is hungry and eager to eat lunch. Family at bedside. Will continue to progress.   If plan is discharge home, recommend the following: A lot of help with walking and/or transfers;A lot of help with bathing/dressing/bathroom;Assist for transportation;Assistance with cooking/housework;Help with stairs or ramp for entrance;Direct supervision/assist for financial management;Direct supervision/assist for medications management   Can travel by private vehicle     No  Equipment Recommendations  None recommended by PT    Recommendations for Other Services       Precautions / Restrictions Precautions Precautions: Fall Recall of Precautions/Restrictions: Intact Restrictions Weight Bearing Restrictions Per Provider Order: No     Mobility  Bed Mobility Overal bed mobility: Needs Assistance   Rolling: Min assist         General bed mobility comments: OOB not attempted due to severe orthostatic hypotension. Assisted with repositioning in bed    Transfers                        Ambulation/Gait                   Stairs             Wheelchair Mobility     Tilt Bed    Modified Rankin (Stroke Patients Only)       Balance                                            Communication Communication Communication: Impaired Factors  Affecting Communication: Hearing impaired  Cognition Arousal: Alert Behavior During Therapy: WFL for tasks assessed/performed   PT - Cognitive impairments: History of cognitive impairments, No family/caregiver present to determine baseline                       PT - Cognition Comments: very pleasant and able to carry conversation Following commands: Intact Following commands impaired: Only follows one step commands consistently    Cueing Cueing Techniques: Verbal cues  Exercises Other Exercises Other Exercises: supine ther-ex performed on B UE/LE including shoulder flexion (approx 60 degrees), SLRs, hip abd/add, quad sets, and AP. 10 reps with safe technique and min assist    General Comments        Pertinent Vitals/Pain Pain Assessment Pain Assessment: No/denies pain    Home Living                          Prior Function            PT Goals (current goals can now be found in the care plan section) Acute Rehab PT Goals Patient Stated Goal: to go to rehab PT Goal Formulation: Patient unable to participate in goal setting Progress towards PT  goals: Progressing toward goals    Frequency    Min 2X/week      PT Plan      Co-evaluation              AM-PAC PT "6 Clicks" Mobility   Outcome Measure  Help needed turning from your back to your side while in a flat bed without using bedrails?: A Lot Help needed moving from lying on your back to sitting on the side of a flat bed without using bedrails?: A Lot Help needed moving to and from a bed to a chair (including a wheelchair)?: A Lot Help needed standing up from a chair using your arms (e.g., wheelchair or bedside chair)?: A Lot Help needed to walk in hospital room?: Total Help needed climbing 3-5 steps with a railing? : Total 6 Click Score: 10    End of Session   Activity Tolerance: Patient limited by fatigue;Treatment limited secondary to medical complications (Comment) Patient left:  in bed;with call bell/phone within reach;with bed alarm set Nurse Communication: Mobility status PT Visit Diagnosis: Difficulty in walking, not elsewhere classified (R26.2);Other abnormalities of gait and mobility (R26.89);Repeated falls (R29.6);Muscle weakness (generalized) (M62.81)     Time: 1610-9604 PT Time Calculation (min) (ACUTE ONLY): 13 min  Charges:    $Therapeutic Exercise: 8-22 mins PT General Charges $$ ACUTE PT VISIT: 1 Visit                     Abigail Strong, PT, DPT, GCS [phone]    Abigail Strong 11/27/2023, 2:51 PM

## 2023-11-28 ENCOUNTER — Observation Stay

## 2023-11-28 ENCOUNTER — Telehealth: Payer: Self-pay | Admitting: Internal Medicine

## 2023-11-28 DIAGNOSIS — R296 Repeated falls: Secondary | ICD-10-CM | POA: Diagnosis present

## 2023-11-28 DIAGNOSIS — D649 Anemia, unspecified: Secondary | ICD-10-CM | POA: Diagnosis present

## 2023-11-28 DIAGNOSIS — Z803 Family history of malignant neoplasm of breast: Secondary | ICD-10-CM | POA: Diagnosis not present

## 2023-11-28 DIAGNOSIS — I130 Hypertensive heart and chronic kidney disease with heart failure and stage 1 through stage 4 chronic kidney disease, or unspecified chronic kidney disease: Secondary | ICD-10-CM | POA: Diagnosis present

## 2023-11-28 DIAGNOSIS — J449 Chronic obstructive pulmonary disease, unspecified: Secondary | ICD-10-CM | POA: Diagnosis present

## 2023-11-28 DIAGNOSIS — R531 Weakness: Secondary | ICD-10-CM | POA: Diagnosis not present

## 2023-11-28 DIAGNOSIS — I951 Orthostatic hypotension: Secondary | ICD-10-CM

## 2023-11-28 DIAGNOSIS — E785 Hyperlipidemia, unspecified: Secondary | ICD-10-CM | POA: Diagnosis present

## 2023-11-28 DIAGNOSIS — E871 Hypo-osmolality and hyponatremia: Secondary | ICD-10-CM | POA: Diagnosis present

## 2023-11-28 DIAGNOSIS — N1831 Chronic kidney disease, stage 3a: Secondary | ICD-10-CM | POA: Diagnosis present

## 2023-11-28 DIAGNOSIS — Z7901 Long term (current) use of anticoagulants: Secondary | ICD-10-CM | POA: Diagnosis not present

## 2023-11-28 DIAGNOSIS — Z8619 Personal history of other infectious and parasitic diseases: Secondary | ICD-10-CM | POA: Diagnosis not present

## 2023-11-28 DIAGNOSIS — E878 Other disorders of electrolyte and fluid balance, not elsewhere classified: Secondary | ICD-10-CM | POA: Diagnosis present

## 2023-11-28 DIAGNOSIS — I440 Atrioventricular block, first degree: Secondary | ICD-10-CM | POA: Diagnosis present

## 2023-11-28 DIAGNOSIS — G4700 Insomnia, unspecified: Secondary | ICD-10-CM | POA: Diagnosis present

## 2023-11-28 DIAGNOSIS — E039 Hypothyroidism, unspecified: Secondary | ICD-10-CM | POA: Diagnosis present

## 2023-11-28 DIAGNOSIS — Z8049 Family history of malignant neoplasm of other genital organs: Secondary | ICD-10-CM | POA: Diagnosis not present

## 2023-11-28 DIAGNOSIS — I4891 Unspecified atrial fibrillation: Secondary | ICD-10-CM | POA: Diagnosis present

## 2023-11-28 DIAGNOSIS — I5033 Acute on chronic diastolic (congestive) heart failure: Secondary | ICD-10-CM | POA: Diagnosis present

## 2023-11-28 DIAGNOSIS — I444 Left anterior fascicular block: Secondary | ICD-10-CM | POA: Diagnosis present

## 2023-11-28 DIAGNOSIS — Z79890 Hormone replacement therapy (postmenopausal): Secondary | ICD-10-CM | POA: Diagnosis not present

## 2023-11-28 DIAGNOSIS — E861 Hypovolemia: Secondary | ICD-10-CM | POA: Diagnosis present

## 2023-11-28 DIAGNOSIS — K219 Gastro-esophageal reflux disease without esophagitis: Secondary | ICD-10-CM | POA: Diagnosis present

## 2023-11-28 DIAGNOSIS — Z66 Do not resuscitate: Secondary | ICD-10-CM | POA: Diagnosis present

## 2023-11-28 DIAGNOSIS — F32A Depression, unspecified: Secondary | ICD-10-CM | POA: Diagnosis present

## 2023-11-28 LAB — SODIUM: Sodium: 127 mmol/L — ABNORMAL LOW (ref 135–145)

## 2023-11-28 LAB — BASIC METABOLIC PANEL WITH GFR
Anion gap: 8 (ref 5–15)
BUN: 20 mg/dL (ref 8–23)
CO2: 25 mmol/L (ref 22–32)
Calcium: 8.7 mg/dL — ABNORMAL LOW (ref 8.9–10.3)
Chloride: 93 mmol/L — ABNORMAL LOW (ref 98–111)
Creatinine, Ser: 1.21 mg/dL — ABNORMAL HIGH (ref 0.44–1.00)
GFR, Estimated: 42 mL/min — ABNORMAL LOW (ref >60)
Glucose, Bld: 85 mg/dL (ref 70–99)
Potassium: 4.2 mmol/L (ref 3.5–5.1)
Sodium: 126 mmol/L — ABNORMAL LOW (ref 135–145)

## 2023-11-28 LAB — CBC
HCT: 27.4 % — ABNORMAL LOW (ref 36.0–46.0)
Hemoglobin: 9.7 g/dL — ABNORMAL LOW (ref 12.0–15.0)
MCH: 30.6 pg (ref 26.0–34.0)
MCHC: 35.4 g/dL (ref 30.0–36.0)
MCV: 86.4 fL (ref 80.0–100.0)
Platelets: 286 10*3/uL (ref 150–400)
RBC: 3.17 MIL/uL — ABNORMAL LOW (ref 3.87–5.11)
RDW: 13.2 % (ref 11.5–15.5)
WBC: 7.6 10*3/uL (ref 4.0–10.5)
nRBC: 0 % (ref 0.0–0.2)

## 2023-11-28 MED ORDER — SODIUM CHLORIDE 1 G PO TABS
1.0000 g | ORAL_TABLET | Freq: Two times a day (BID) | ORAL | Status: DC
  Administered 2023-11-28 – 2023-11-29 (×3): 1 g via ORAL
  Filled 2023-11-28 (×3): qty 1

## 2023-11-28 NOTE — Telephone Encounter (Signed)
 Harvie Heck, nurse with [facility], left VM that he went to the home for a visit and got no answer on the phone or at the door.   Patient is currently admitted at [facility]. I called and notified Harvie Heck of this.

## 2023-11-28 NOTE — Progress Notes (Signed)
 Occupational Therapy Treatment Patient Details Name: Abigail Strong MRN: [id number] DOB: December 13, 1929 Today's Date: 11/28/2023   History of present illness Pt is a 89yo F presented to [facility] with acute onset generalized weakness, difficulty walking, multiple recent falls. She has been having right leg pain and has been feeling her legs have been giving way. PMH: GERD hypoTSH, HLD, chronic bilateral knee pain, falls, UTI, COPD, AF, heart failure.   OT comments  Pt is supine in bed on arrival. Pleasant and agreeable to OT session. She continues to have RLE pain limiting her ability to take steps. Pt performed supine to sit with Min A and sit to supine with Mod A for BLE management. Orthostatics taken during session with pt denying dizziness.  BP- Lying BP- Sitting BP- Standing at 0 minutes  11/28/23 1557 94/64 (!) 85/58 106/64      Pt required Mod/Max A for 2 STS trials of ~1 min standing tolerance with BUE support on RW to change pull-ups d/t being soiled. Attempted lateral step to [facility] and unable. Needed Mod A to reposition in bed. She fatigues easily and continues to be limited by RLE pain.  Pt returned to bed with all needs in place and will cont to require skilled acute OT services to maximize her safety and IND to return to PLOF.       If plan is discharge home, recommend the following:  A lot of help with walking and/or transfers;A lot of help with bathing/dressing/bathroom;Assist for transportation;Help with stairs or ramp for entrance   Equipment Recommendations  BSC/3in1;Wheelchair (measurements OT);Other (comment)    Recommendations for Other Services      Precautions / Restrictions Precautions Precautions: Fall Recall of Precautions/Restrictions: Intact Restrictions Weight Bearing Restrictions Per Provider Order: No       Mobility Bed Mobility Overal bed mobility: Needs Assistance Bed Mobility: Supine to Sit, Sit to Supine     Supine to sit: Min assist, HOB  elevated Sit to supine: Mod assist   General bed mobility comments: Min A via HHA for supine to sit; CGA to forward scoot to EOB with increased time; Mod A for BLE management to return to supine    Transfers Overall transfer level: Needs assistance Equipment used: Rolling walker (2 wheels) Transfers: Sit to/from Stand Sit to Stand: Max assist, Mod assist           General transfer comment: Mod/Max A for x2 STS trials during session with ~1 min standing tolerance today; RLE pain makes it difficult to side step     Balance Overall balance assessment: Needs assistance Sitting-balance support: Feet supported, Single extremity supported Sitting balance-Leahy Scale: Good     Standing balance support: Bilateral upper extremity supported, Reliant on assistive device for balance   Standing balance comment: RW x1 assist and BUE support needed                           ADL either performed or assessed with clinical judgement   ADL Overall ADL's : Needs assistance/impaired                     Lower Body Dressing: Maximal assistance;Sit to/from stand Lower Body Dressing Details (indicate cue type and reason): to doff/don pull up seated/standing EOB                    Extremity/Trunk Assessment  Vision       Perception     Praxis     Communication Communication Communication: Impaired Factors Affecting Communication: Hearing impaired   Cognition Arousal: Alert Behavior During Therapy: WFL for tasks assessed/performed                                 Following commands: Intact Following commands impaired: Only follows one step commands consistently      Cueing   Cueing Techniques: Verbal cues  Exercises      Shoulder Instructions       General Comments orthostatic positive, but then BP improves; denies dizziness    Pertinent Vitals/ Pain       Pain Assessment Pain Assessment: Faces Faces Pain Scale:  Hurts even more Pain Location: RLE Pain Descriptors / Indicators: Aching, Sore Pain Intervention(s): Monitored during session, Repositioned  Home Living                                          Prior Functioning/Environment              Frequency  Min 2X/week        Progress Toward Goals  OT Goals(current goals can now be found in the care plan section)  Progress towards OT goals: Progressing toward goals  Acute Rehab OT Goals Patient Stated Goal: improve function OT Goal Formulation: With patient Time For Goal Achievement: 12/09/23 Potential to Achieve Goals: Fair  Plan      Co-evaluation                 AM-PAC OT "6 Clicks" Daily Activity     Outcome Measure   Help from another person eating meals?: None Help from another person taking care of personal grooming?: A Little Help from another person toileting, which includes using toliet, bedpan, or urinal?: A Lot Help from another person bathing (including washing, rinsing, drying)?: A Lot Help from another person to put on and taking off regular upper body clothing?: A Little Help from another person to put on and taking off regular lower body clothing?: A Lot 6 Click Score: 16    End of Session Equipment Utilized During Treatment: Rolling walker (2 wheels);Gait belt  OT Visit Diagnosis: Other abnormalities of gait and mobility (R26.89);Unsteadiness on feet (R26.81);Muscle weakness (generalized) (M62.81);History of falling (Z91.81)   Activity Tolerance Patient tolerated treatment well   Patient Left in bed;with call bell/phone within reach;with bed alarm set   Nurse Communication Mobility status        Time: 1610-9604 OT Time Calculation (min): 26 min  Charges: OT General Charges $OT Visit: 1 Visit OT Treatments $Self Care/Home Management : 8-22 mins $Therapeutic Activity: 8-22 mins  Jermanie Minshall, OTR/L  11/28/23, 4:08 PM   Thurmon Mizell E Errika Narvaiz 11/28/2023, 4:05 PM

## 2023-11-28 NOTE — TOC Progression Note (Signed)
 Transition of Care [facility]) - Progression Note    Patient Details  Name: Abigail Strong MRN: [id number] Date of Birth: [birth year]  Transition of Care [facility]) CM/SW Contact  Cherre Blanc, RN Phone Number: 11/28/2023, 3:32 PM  Clinical Narrative:    Patient became symptomatic on yesterday. Her discharge was delayed. Plan for discharge to [facility] tomorrow.  TOC will continue to follow.     Barriers to Discharge: SNF Pending bed offer  Expected Discharge Plan and Services                                               Social Determinants of Health (SDOH) Interventions SDOH Screenings   Food Insecurity: No Food Insecurity (11/24/2023)  Housing: Low Risk  (11/24/2023)  Transportation Needs: No Transportation Needs (11/24/2023)  Utilities: Not At Risk (11/24/2023)  Alcohol Screen: Low Risk  (05/04/2021)  Depression (PHQ2-9): Low Risk  (05/04/2021)  Financial Resource Strain: High Risk (05/04/2021)  Physical Activity: Inactive (05/04/2021)  Social Connections: Unknown (11/24/2023)  Stress: No Stress Concern Present (05/04/2021)  Tobacco Use: Low Risk  (11/24/2023)    Readmission Risk Interventions     No data to display

## 2023-11-28 NOTE — Progress Notes (Signed)
 Progress Note   Patient: Abigail Strong ZOX:[id number] DOB: 12-14-1929 DOA: 11/23/2023     0 DOS: the patient was seen and examined on 11/28/2023   Brief hospital course: Abigail Strong is a 89 y.o. Caucasian female with medical history significant for GERD, depression, hypertension, dyslipidemia, atrial fibrillation, COPD, HFpEF hypothyroidism and insomnia, who presented to the emergency room with acute onset generalized weakness the patient has been having elevated ambulate and recent multiple falls.  BNP 116.6, Na 129 admitted for generalized weakness, hyponatremia work up.  Assessment and Plan: * Generalized weakness In the setting of advanced age, electrolyte abnormalities. Encourage oral diet, supplements. Hold off on hydration or diuresis for another day. PT/ OT evaluated her, she was orthostatic, with dizziness. Will hold dc today.  Hypotonic Hyponatremia Possibly in the setting of Torsemide use. Stop gentle IV hydration ordered due to orthostasis. Na 126 today. Salt tabs started. Continue to trend Na.  Dyslipidemia Continue fenofibrate and statin therapy.  Acute on chronic diastolic CHF (congestive heart failure) (HCC) 2D echo revealed on 03/11/2021 an EF of 60 to 65% with grade 1 diastolic dysfunction. She got 20 mg of IV Lasix. Does not seem fluid overload. Will follow strict I's and O's. Will hold Torsemide.  GERD without esophagitis Continue PPI therapy.  Hypothyroidism Continue Synthroid, TSH level normal.  Depression Continue Celexa and hydroxyzine.  Hypertension Orthostatic hypotension-  Gentle IV fluids. Hold antihypertensives.   COPD, not currently in exacerbation Continue monitor closely. Resume Home inhalers   A-fib Heart rate stable. Continue home dose Eliquis.  CKD stage 3A- Kidney function stable. Monitor daily renal function.  Chronic Anemia- Stable     Out of bed to chair. Incentive spirometry. Nursing supportive  care. Fall, aspiration precautions. Diet:  Diet Orders (From admission, onward)     Start     Ordered   11/24/23 1553  DIET DYS 3 Room service appropriate? Yes; Fluid consistency: Thin  Diet effective now       Question Answer Comment  Room service appropriate? Yes   Fluid consistency: Thin      11/24/23 1553           DVT prophylaxis: apixaban (ELIQUIS) tablet 2.5 mg Start: 11/24/23 1000 apixaban (ELIQUIS) tablet 2.5 mg  Level of care: Telemetry Medical   Code Status: Limited: Do not attempt resuscitation (DNR) -DNR-LIMITED -Do Not Intubate/DNI   Subjective: Patient is seen and examined today morning. She feels better, eating fair. Did not get out of bed. BP continues to be lower side.  Physical Exam: Vitals:   11/27/23 1522 11/27/23 1948 11/28/23 0459 11/28/23 0802  BP: (!) 96/57 (!) 102/58 106/61 99/66  Pulse: 69 74 74 77  Resp: 17 20 19 16   Temp: 97.9 F (36.6 C) 97.6 F (36.4 C) 98.5 F (36.9 C) 98.2 F (36.8 C)  TempSrc: Oral     SpO2: 94% 95% 91% 95%  Weight:      Height:        General - Elderly weak Caucasian female, no apparent distress HEENT - PERRLA, EOMI, atraumatic head, non tender sinuses. Lung - Clear, basal rales, rhonchi, no wheezes. Heart - S1, S2 heard, no murmurs, rubs, no pedal edema. Abdomen - Soft, non tender, bowel sounds good Neuro - Alert, awake and oriented x 3, non focal exam. Skin - Warm and dry.  Data Reviewed:      Latest Ref Rng & Units 11/28/2023    5:34 AM 11/27/2023    4:39 AM 11/24/2023  4:11 AM  CBC  WBC 4.0 - 10.5 K/uL 7.6  8.5  8.5   Hemoglobin 12.0 - 15.0 g/dL 9.7  41.3  9.4   Hematocrit 36.0 - 46.0 % 27.4  28.5  27.8   Platelets 150 - 400 K/uL 286  300  281       Latest Ref Rng & Units 11/28/2023   11:43 AM 11/28/2023    5:34 AM 11/27/2023    4:39 AM  BMP  Glucose 70 - 99 mg/dL  85  74   BUN 8 - 23 mg/dL  20  16   Creatinine 2.44 - 1.00 mg/dL  0.10  2.72   Sodium 536 - 145 mmol/L 127  126  127   Potassium 3.5  - 5.1 mmol/L  4.2  4.2   Chloride 98 - 111 mmol/L  93  95   CO2 22 - 32 mmol/L  25  25   Calcium 8.9 - 10.3 mg/dL  8.7  8.8    DG Chest 1 View Result Date: 11/28/2023 CLINICAL DATA:  Cough, abnormal breath sounds. EXAM: CHEST  1 VIEW COMPARISON:  November 23, 2023. FINDINGS: Stable cardiomediastinal silhouette. No acute pulmonary disease is noted. Bony thorax is unremarkable. IMPRESSION: No active disease. Electronically Signed   By: Lupita Raider M.D.   On: 11/28/2023 14:57     Family Communication: Discussed with patient, she understand and agree. All questions answered.   Disposition: Status is: Observation The patient remains OBS appropriate and will d/c before 2 midnights.  Planned Discharge Destination: Rehab     Time spent: 38 minutes  Author: Marcelino Duster, MD 11/28/2023 3:20 PM Secure chat 7am to 7pm For on call review [email or web address].

## 2023-11-28 NOTE — Plan of Care (Signed)

## 2023-11-29 DIAGNOSIS — E871 Hypo-osmolality and hyponatremia: Secondary | ICD-10-CM | POA: Diagnosis not present

## 2023-11-29 DIAGNOSIS — R531 Weakness: Secondary | ICD-10-CM | POA: Diagnosis not present

## 2023-11-29 DIAGNOSIS — E039 Hypothyroidism, unspecified: Secondary | ICD-10-CM

## 2023-11-29 DIAGNOSIS — I5033 Acute on chronic diastolic (congestive) heart failure: Secondary | ICD-10-CM | POA: Diagnosis not present

## 2023-11-29 DIAGNOSIS — I482 Chronic atrial fibrillation, unspecified: Secondary | ICD-10-CM

## 2023-11-29 LAB — CBC
HCT: 28.4 % — ABNORMAL LOW (ref 36.0–46.0)
Hemoglobin: 9.7 g/dL — ABNORMAL LOW (ref 12.0–15.0)
MCH: 30.6 pg (ref 26.0–34.0)
MCHC: 34.2 g/dL (ref 30.0–36.0)
MCV: 89.6 fL (ref 80.0–100.0)
Platelets: 306 10*3/uL (ref 150–400)
RBC: 3.17 MIL/uL — ABNORMAL LOW (ref 3.87–5.11)
RDW: 13.4 % (ref 11.5–15.5)
WBC: 8 10*3/uL (ref 4.0–10.5)
nRBC: 0 % (ref 0.0–0.2)

## 2023-11-29 LAB — BASIC METABOLIC PANEL WITH GFR
Anion gap: 5 (ref 5–15)
BUN: 17 mg/dL (ref 8–23)
CO2: 28 mmol/L (ref 22–32)
Calcium: 8.8 mg/dL — ABNORMAL LOW (ref 8.9–10.3)
Chloride: 99 mmol/L (ref 98–111)
Creatinine, Ser: 1.15 mg/dL — ABNORMAL HIGH (ref 0.44–1.00)
GFR, Estimated: 45 mL/min — ABNORMAL LOW (ref >60)
Glucose, Bld: 84 mg/dL (ref 70–99)
Potassium: 4.3 mmol/L (ref 3.5–5.1)
Sodium: 132 mmol/L — ABNORMAL LOW (ref 135–145)

## 2023-11-29 MED ORDER — MECLIZINE HCL 12.5 MG PO TABS: 12.5000 mg | ORAL_TABLET | Freq: Two times a day (BID) | ORAL | Status: DC | PRN

## 2023-11-29 MED ORDER — TORSEMIDE 10 MG PO TABS: 10.0000 mg | ORAL_TABLET | Freq: Every day | ORAL | Status: DC | PRN

## 2023-11-29 MED ORDER — BENZONATATE 100 MG PO CAPS: 100.0000 mg | ORAL_CAPSULE | Freq: Three times a day (TID) | ORAL | 0 refills | Status: DC | PRN

## 2023-11-29 MED ORDER — SODIUM CHLORIDE 1 G PO TABS: 1.0000 g | ORAL_TABLET | Freq: Two times a day (BID) | ORAL | 0 refills | Status: AC

## 2023-11-29 MED ORDER — POLYETHYLENE GLYCOL 3350 17 G PO PACK: 17.0000 g | PACK | ORAL | 0 refills | Status: DC

## 2023-11-29 NOTE — TOC Transition Note (Signed)
 Transition of Care [facility]) - Discharge Note   Patient Details  Name: Abigail Strong MRN: [id number] Date of Birth: [birth year]  Transition of Care [facility]) CM/SW Contact:  Cherre Blanc, RN Phone Number: 11/29/2023, 10:56 AM   Clinical Narrative:    Patient is medically clear to [facility] today for short term rehab. The patient's sister, Bonita Quin [phone], is aware and is agreeable with the dc plan. Transportation arranged with [employer] No other TOC needs identified   Final next level of care: Skilled Nursing Facility Barriers to Discharge: Continued Medical Work up   Patient Goals and CMS Choice            Discharge Placement              Patient chooses bed at: Other - please specify in the comment section below: ([facility]) Patient to be transferred to facility by: [facility] Name of family member notified: [name] [phone] Patient and family notified of of transfer: 11/29/23  Discharge Plan and Services Additional resources added to the After Visit Summary for                                       Social Drivers of Health (SDOH) Interventions SDOH Screenings   Food Insecurity: No Food Insecurity (11/24/2023)  Housing: Low Risk  (11/24/2023)  Transportation Needs: No Transportation Needs (11/24/2023)  Utilities: Not At Risk (11/24/2023)  Alcohol Screen: Low Risk  (05/04/2021)  Depression (PHQ2-9): Low Risk  (05/04/2021)  Financial Resource Strain: High Risk (05/04/2021)  Physical Activity: Inactive (05/04/2021)  Social Connections: Unknown (11/24/2023)  Stress: No Stress Concern Present (05/04/2021)  Tobacco Use: Low Risk  (11/24/2023)     Readmission Risk Interventions     No data to display

## 2023-11-29 NOTE — Discharge Summary (Signed)
 Physician Discharge Summary   Patient: Abigail Strong MRN: [id number] DOB: Dec 29, 1929  Admit date:     11/23/2023  Discharge date: 11/29/23  Discharge Physician: Marcelino Duster   PCP: Margaretann Loveless, MD   Recommendations at discharge:   PCP follow up in 1 week. BMP in 1 week for sodium and renal function check. Cardiology follow up as scheduled.  Discharge Diagnoses: Principal Problem:   Generalized weakness Active Problems:   Hyponatremia   Dyslipidemia   Acute on chronic diastolic CHF (congestive heart failure) (HCC)   Depression   Hypothyroidism   GERD without esophagitis  Resolved Problems:   * No resolved hospital problems. *  Hospital Course: Abigail Strong is a 89 y.o. Caucasian female with medical history significant for GERD, depression, hypertension, dyslipidemia, atrial fibrillation, COPD, HFpEF hypothyroidism and insomnia, who presented to the emergency room with acute onset generalized weakness the patient has been having elevated ambulate and recent multiple falls.  BNP 116.6, Na 129 admitted for generalized weakness, hyponatremia work up.  Assessment and Plan: * Generalized weakness In the setting of advanced age, electrolyte abnormalities. Encourage oral diet, supplements. Diuretic changed to as needed. IV fluids stopped. Orthostatics better. PT/ OT evaluated advised SNF placement.   Hypotonic Hyponatremia Possibly in the setting of Torsemide use. Stopped IV hydration, Torsemide changed to as needed. Na 132 improved with Salt tabs. Prescribed for 20 days with outpatient BMP follow up.   Dyslipidemia Continue fenofibrate and statin therapy.   Acute on chronic diastolic CHF (congestive heart failure) (HCC) 2D echo revealed EF 65%, grade 1 diastolic dysfunction.  She got 20 mg of IV Lasix. Chest xray looks stable. Does not seem fluid overload. Torsemide changed to as needed. Lisinopril held due to borderline low BP.   GERD without  esophagitis Continue PPI therapy.   Hypothyroidism Continue Synthroid, TSH level normal.   Depression Continue Celexa and hydroxyzine.   Hypertension Orthostatic hypotension-  Lisinopril held.   COPD, not currently in exacerbation Continue monitor closely. Resume Home inhalers   A-fib Heart rate stable. Continue home dose Eliquis.   CKD stage 3A- Kidney function stable. Monitor daily renal function.   Chronic Anemia- Stable         Consultants: none Procedures performed: none  Disposition: Skilled nursing facility Diet recommendation:  Discharge Diet Orders (From admission, onward)     Start     Ordered   11/29/23 0000  Diet - low sodium heart healthy        11/29/23 1117           Cardiac diet DISCHARGE MEDICATION: Allergies as of 11/29/2023       Reactions   Gabapentin Other (See Comments)   Dizziness    Sulfa Antibiotics Other (See Comments)   Sulfonamide Derivatives         Medication List     STOP taking these medications    lisinopril 2.5 MG tablet Commonly known as: ZESTRIL       TAKE these medications    acetaminophen 500 MG tablet Commonly known as: TYLENOL Take 1,000 mg by mouth every 6 (six) hours as needed for mild pain or moderate pain.   benzonatate 100 MG capsule Commonly known as: TESSALON Take 1 capsule (100 mg total) by mouth 3 (three) times daily as needed for cough.   Cholecalciferol 50 MCG (2000 UT) Caps Take 2,000 Units by mouth daily.   citalopram 20 MG tablet Commonly known as: CELEXA Take 1 tablet (20 mg total)  by mouth daily.   Eliquis 2.5 MG Tabs tablet Generic drug: apixaban Take 1 tablet (2.5 mg total) by mouth 2 (two) times daily.   fenofibrate 54 MG tablet TAKE 1 TABLET(54 MG) BY MOUTH DAILY   ferrous sulfate 324 MG Tbec Take 324 mg by mouth daily with breakfast.   fluticasone 50 MCG/ACT nasal spray Commonly known as: FLONASE Place 1 spray into both nostrils daily.   Fusion Plus  Caps Take 1 capsule by mouth daily.   hydrOXYzine 25 MG tablet Commonly known as: ATARAX TAKE 1 TABLET(25 MG) BY MOUTH EVERY 6 HOURS AS NEEDED   levothyroxine 112 MCG tablet Commonly known as: Levoxyl Take 1 tablet (112 mcg total) by mouth daily before breakfast.   meclizine 12.5 MG tablet Commonly known as: ANTIVERT Take 1 tablet (12.5 mg total) by mouth 2 (two) times daily as needed for dizziness.   pantoprazole 40 MG tablet Commonly known as: PROTONIX TAKE 1 TABLET(40 MG) BY MOUTH DAILY   polyethylene glycol 17 g packet Commonly known as: MIRALAX / GLYCOLAX Take 17 g by mouth every other day. Start taking on: November 30, 2023   ranolazine 500 MG 12 hr tablet Commonly known as: RANEXA Take 1 tablet (500 mg total) by mouth 2 (two) times daily.   rosuvastatin 20 MG tablet Commonly known as: CRESTOR Take 1 tablet (20 mg total) by mouth daily.   sodium chloride 1 g tablet Take 1 tablet (1 g total) by mouth 2 (two) times daily with a meal for 20 days.   torsemide 10 MG tablet Commonly known as: DEMADEX Take 1 tablet (10 mg total) by mouth daily as needed (Edema, fluid overload). What changed: See the new instructions.        Follow-up Information     Margaretann Loveless, MD Follow up.   Specialty: Internal Medicine Why: Hospital follow up Contact information: [address] [place] [address] [phone]                Discharge Exam: Filed Weights   11/23/23 2056  Weight: 63.5 kg      11/29/2023    7:36 AM 11/29/2023    4:53 AM 11/28/2023    7:52 PM  Vitals with BMI  Systolic 92 110 107  Diastolic 62 69 64  Pulse 84 85 86    General - Elderly weak Caucasian female, no apparent distress HEENT - PERRLA, EOMI, atraumatic head, non tender sinuses. Lung - Clear, basal rales, rhonchi, no wheezes. Heart - S1, S2 heard, no murmurs, rubs, no pedal edema. Abdomen - Soft, non tender, bowel sounds good Neuro - Alert, awake and oriented x 3, non focal  exam. Skin - Warm and dry.  Condition at discharge: stable  The results of significant diagnostics from this hospitalization (including imaging, microbiology, ancillary and laboratory) are listed below for reference.   Imaging Studies: DG Chest 1 View Result Date: 11/28/2023 CLINICAL DATA:  Cough, abnormal breath sounds. EXAM: CHEST  1 VIEW COMPARISON:  November 23, 2023. FINDINGS: Stable cardiomediastinal silhouette. No acute pulmonary disease is noted. Bony thorax is unremarkable. IMPRESSION: No active disease. Electronically Signed   By: Lupita Raider M.D.   On: 11/28/2023 14:57   ECHOCARDIOGRAM COMPLETE Result Date: 11/25/2023    ECHOCARDIOGRAM REPORT   Patient Name:   Abigail Strong Date of Exam: 11/25/2023 Medical Rec #:  [id number]         Height:       64.0 in Accession #:  [id number]        Weight:       140.0 lb Date of Birth:  November 02, 1929        BSA:          1.681 m Patient Age:    89 years          BP:           105/56 mmHg Patient Gender: F                 HR:           78 bpm. Exam Location:  [facility] Procedure: 2D Echo, Cardiac Doppler and Color Doppler (Both Spectral and Color            Flow Doppler were utilized during procedure). Indications:     CHF-acute diastolic I50.31  History:         Patient has prior history of Echocardiogram examinations, most                  recent 03/12/2021. Risk Factors:Dyslipidemia. Hypotyhroidism.  Sonographer:     Cristela Blue Referring Phys:  [id number] Debarah Crape Diagnosing Phys: Adrian Blackwater IMPRESSIONS  1. Left ventricular ejection fraction, by estimation, is 65 to 70%. The left ventricle has normal function. The left ventricle has no regional wall motion abnormalities. Left ventricular diastolic parameters are consistent with Grade I diastolic dysfunction (impaired relaxation).  2. Right ventricular systolic function is normal. The right ventricular size is normal.  3. Left atrial size was mild to moderately dilated.  4. Right atrial size was mild to  moderately dilated.  5. The mitral valve is normal in structure. Mild to moderate mitral valve regurgitation. No evidence of mitral stenosis.  6. The aortic valve is normal in structure. Aortic valve regurgitation is mild. Aortic valve sclerosis/calcification is present, without any evidence of aortic stenosis.  7. The inferior vena cava is normal in size with greater than 50% respiratory variability, suggesting right atrial pressure of 3 mmHg. FINDINGS  Left Ventricle: Left ventricular ejection fraction, by estimation, is 65 to 70%. The left ventricle has normal function. The left ventricle has no regional wall motion abnormalities. Strain was performed and the global longitudinal strain is indeterminate. The left ventricular internal cavity size was normal in size. There is no left ventricular hypertrophy. Left ventricular diastolic parameters are consistent with Grade I diastolic dysfunction (impaired relaxation). Right Ventricle: The right ventricular size is normal. No increase in right ventricular wall thickness. Right ventricular systolic function is normal. Left Atrium: Left atrial size was mild to moderately dilated. Right Atrium: Right atrial size was mild to moderately dilated. Pericardium: There is no evidence of pericardial effusion. Mitral Valve: The mitral valve is normal in structure. Mild to moderate mitral valve regurgitation. No evidence of mitral valve stenosis. Tricuspid Valve: The tricuspid valve is normal in structure. Tricuspid valve regurgitation is mild . No evidence of tricuspid stenosis. Aortic Valve: The aortic valve is normal in structure. Aortic valve regurgitation is mild. Aortic valve sclerosis/calcification is present, without any evidence of aortic stenosis. Aortic valve mean gradient measures 4.0 mmHg. Aortic valve peak gradient measures 6.7 mmHg. Aortic valve area, by VTI measures 5.47 cm. Pulmonic Valve: The pulmonic valve was normal in structure. Pulmonic valve regurgitation  is not visualized. No evidence of pulmonic stenosis. Aorta: The aortic root is normal in size and structure. Venous: The inferior vena cava is normal in size with greater than 50% respiratory variability, suggesting right atrial pressure  of 3 mmHg. IAS/Shunts: No atrial level shunt detected by color flow Doppler. Additional Comments: 3D was performed not requiring image post processing on an independent workstation and was indeterminate.  LEFT VENTRICLE PLAX 2D LVIDd:         4.80 cm   Diastology LVIDs:         2.90 cm   LV e' medial:    5.44 cm/s LV PW:         1.20 cm   LV E/e' medial:  10.9 LV IVS:        1.10 cm   LV e' lateral:   9.79 cm/s LVOT diam:     3.15 cm   LV E/e' lateral: 6.0 LV SV:         143 LV SV Index:   85 LVOT Area:     7.79 cm  RIGHT VENTRICLE RV Basal diam:  2.70 cm RV Mid diam:    2.30 cm RV S prime:     11.30 cm/s TAPSE (M-mode): 1.2 cm LEFT ATRIUM             Index        RIGHT ATRIUM          Index LA diam:        3.90 cm 2.32 cm/m   RA Area:     6.77 cm LA Vol (A2C):   80.2 ml 47.70 ml/m  RA Volume:   8.97 ml  5.34 ml/m LA Vol (A4C):   56.7 ml 33.73 ml/m LA Biplane Vol: 69.7 ml 41.46 ml/m  AORTIC VALVE AV Area (Vmax):    4.92 cm AV Area (Vmean):   4.67 cm AV Area (VTI):     5.47 cm AV Vmax:           129.00 cm/s AV Vmean:          88.600 cm/s AV VTI:            0.262 m AV Peak Grad:      6.7 mmHg AV Mean Grad:      4.0 mmHg LVOT Vmax:         81.40 cm/s LVOT Vmean:        53.100 cm/s LVOT VTI:          0.184 m LVOT/AV VTI ratio: 0.70  AORTA Ao Root diam: 3.60 cm MITRAL VALVE               TRICUSPID VALVE MV Area (PHT): 2.77 cm    TR Peak grad:   26.6 mmHg MV Decel Time: 274 msec    TR Vmax:        258.00 cm/s MV E velocity: 59.20 cm/s MV A velocity: 85.90 cm/s  SHUNTS MV E/A ratio:  0.69        Systemic VTI:  0.18 m                            Systemic Diam: 3.15 cm Adrian Blackwater Electronically signed by Adrian Blackwater Signature Date/Time: 11/25/2023/11:11:08 AM    Final    DG  Chest Port 1 View Result Date: 11/23/2023 CLINICAL DATA:  Status post fall. EXAM: PORTABLE CHEST 1 VIEW COMPARISON:  October 26, 2023 FINDINGS: The heart size and mediastinal contours are within normal limits. Low lung volumes are noted with mild, diffuse, chronic appearing increased interstitial lung markings. There is no evidence of acute infiltrate, pleural effusion or pneumothorax. A stable small  to moderate sized hiatal hernia is seen. Multilevel degenerative changes are noted throughout the thoracic spine. IMPRESSION: Low lung volumes with mild, diffuse, chronic appearing increased interstitial lung markings. Electronically Signed   By: Aram Candela M.D.   On: 11/23/2023 22:11   CT Head Wo Contrast Result Date: 11/23/2023 CLINICAL DATA:  Generalized weakness. EXAM: CT HEAD WITHOUT CONTRAST TECHNIQUE: Contiguous axial images were obtained from the base of the skull through the vertex without intravenous contrast. RADIATION DOSE REDUCTION: This exam was performed according to the departmental dose-optimization program which includes automated exposure control, adjustment of the mA and/or kV according to patient size and/or use of iterative reconstruction technique. COMPARISON:  None Available. FINDINGS: Brain: There is mild cerebral atrophy with widening of the extra-axial spaces and ventricular dilatation. There are areas of decreased attenuation within the white matter tracts of the supratentorial brain, consistent with microvascular disease changes. Small, bilateral chronic basal ganglia lacunar infarcts are noted. Vascular: Moderate severity bilateral cavernous carotid artery calcification is noted. Skull: Normal. Negative for fracture or focal lesion. Sinuses/Orbits: No acute finding. Other: None. IMPRESSION: 1. No acute intracranial abnormality. 2. Generalized cerebral atrophy and microvascular disease changes of the supratentorial brain. 3. Small, bilateral chronic basal ganglia lacunar infarcts.  Electronically Signed   By: Aram Candela M.D.   On: 11/23/2023 22:08    Microbiology: Results for orders placed or performed during the hospital encounter of 10/26/23  Resp panel by RT-PCR (RSV, Flu A&B, Covid) Anterior Nasal Swab     Status: None   Collection Time: 10/26/23 10:37 AM   Specimen: Anterior Nasal Swab  Result Value Ref Range Status   SARS Coronavirus 2 by RT PCR NEGATIVE NEGATIVE Final    Comment: (NOTE) SARS-CoV-2 target nucleic acids are NOT DETECTED.  The SARS-CoV-2 RNA is generally detectable in upper respiratory specimens during the acute phase of infection. The lowest concentration of SARS-CoV-2 viral copies this assay can detect is 138 copies/mL. A negative result does not preclude SARS-Cov-2 infection and should not be used as the sole basis for treatment or other patient management decisions. A negative result may occur with  improper specimen collection/handling, submission of specimen other than nasopharyngeal swab, presence of viral mutation(s) within the areas targeted by this assay, and inadequate number of viral copies(<138 copies/mL). A negative result must be combined with clinical observations, patient history, and epidemiological information. The expected result is Negative.  Fact Sheet for Patients:  BloggerCourse.com  Fact Sheet for Healthcare Providers:  SeriousBroker.it  This test is no t yet approved or cleared by the [place] FDA and  has been authorized for detection and/or diagnosis of SARS-CoV-2 by FDA under an Emergency Use Authorization (EUA). This EUA will remain  in effect (meaning this test can be used) for the duration of the COVID-19 declaration under Section 564(b)(1) of the Act, 21 U.S.C.section 360bbb-3(b)(1), unless the authorization is terminated  or revoked sooner.       Influenza A by PCR NEGATIVE NEGATIVE Final   Influenza B by PCR NEGATIVE NEGATIVE Final     Comment: (NOTE) The Xpert Xpress SARS-CoV-2/FLU/RSV plus assay is intended as an aid in the diagnosis of influenza from Nasopharyngeal swab specimens and should not be used as a sole basis for treatment. Nasal washings and aspirates are unacceptable for Xpert Xpress SARS-CoV-2/FLU/RSV testing.  Fact Sheet for Patients: BloggerCourse.com  Fact Sheet for Healthcare Providers: SeriousBroker.it  This test is not yet approved or cleared by the [place] FDA and has been  authorized for detection and/or diagnosis of SARS-CoV-2 by FDA under an Emergency Use Authorization (EUA). This EUA will remain in effect (meaning this test can be used) for the duration of the COVID-19 declaration under Section 564(b)(1) of the Act, 21 U.S.C. section 360bbb-3(b)(1), unless the authorization is terminated or revoked.     Resp Syncytial Virus by PCR NEGATIVE NEGATIVE Final    Comment: (NOTE) Fact Sheet for Patients: BloggerCourse.com  Fact Sheet for Healthcare Providers: SeriousBroker.it  This test is not yet approved or cleared by the [place] FDA and has been authorized for detection and/or diagnosis of SARS-CoV-2 by FDA under an Emergency Use Authorization (EUA). This EUA will remain in effect (meaning this test can be used) for the duration of the COVID-19 declaration under Section 564(b)(1) of the Act, 21 U.S.C. section 360bbb-3(b)(1), unless the authorization is terminated or revoked.  Performed at [facility], [address] [place], [place] [address]   Urine Culture     Status: Abnormal   Collection Time: 10/26/23 10:37 AM   Specimen: Urine, Random  Result Value Ref Range Status   Specimen Description   Final    URINE, RANDOM Performed at [facility], [address] [place], [place] [address]    Special Requests   Final    NONE Reflexed from  [id number] Performed at [facility], [address] [place], [place] [address]    Culture >=100,000 COLONIES/mL ESCHERICHIA COLI (A)  Final   Report Status 10/28/2023 FINAL  Final   Organism ID, Bacteria ESCHERICHIA COLI (A)  Final      Susceptibility   Escherichia coli - MIC*    AMPICILLIN <=2 SENSITIVE Sensitive     CEFAZOLIN <=4 SENSITIVE Sensitive     CEFEPIME <=0.12 SENSITIVE Sensitive     CEFTRIAXONE <=0.25 SENSITIVE Sensitive     CIPROFLOXACIN <=0.25 SENSITIVE Sensitive     GENTAMICIN <=1 SENSITIVE Sensitive     IMIPENEM <=0.25 SENSITIVE Sensitive     NITROFURANTOIN <=16 SENSITIVE Sensitive     TRIMETH/SULFA <=20 SENSITIVE Sensitive     AMPICILLIN/SULBACTAM <=2 SENSITIVE Sensitive     PIP/TAZO <=4 SENSITIVE Sensitive ug/mL    * >=100,000 COLONIES/mL ESCHERICHIA COLI    Labs: CBC: Recent Labs  Lab 11/23/23 2117 11/24/23 0411 11/27/23 0439 11/28/23 0534 11/29/23 0423  WBC 8.1 8.5 8.5 7.6 8.0  HGB 9.6* 9.4* 10.0* 9.7* 9.7*  HCT 28.5* 27.8* 28.5* 27.4* 28.4*  MCV 90.2 90.3 86.1 86.4 89.6  PLT 285 281 300 286 306   Basic Metabolic Panel: Recent Labs  Lab 11/23/23 2117 11/24/23 0411 11/27/23 0439 11/28/23 0534 11/28/23 1143 11/29/23 0423  NA 129* 132* 127* 126* 127* 132*  K 4.3 3.7 4.2 4.2  --  4.3  CL 95* 98 95* 93*  --  99  CO2 27 26 25 25   --  28  GLUCOSE 79 86 74 85  --  84  BUN 15 14 16 20   --  17  CREATININE 1.11* 1.07* 1.03* 1.21*  --  1.15*  CALCIUM 8.9 9.0 8.8* 8.7*  --  8.8*   Liver Function Tests: Recent Labs  Lab 11/23/23 2117  AST 15  ALT 7  ALKPHOS 39  BILITOT 0.6  PROT 6.1*  ALBUMIN 3.5   CBG: No results for input(s): "GLUCAP" in the last 168 hours.  Discharge time spent: 36 minutes.  Signed: Marcelino Duster, MD [facility] 11/29/2023

## 2023-12-11 ENCOUNTER — Other Ambulatory Visit: Payer: Self-pay | Admitting: Internal Medicine

## 2024-04-13 ENCOUNTER — Emergency Department

## 2024-04-13 ENCOUNTER — Other Ambulatory Visit: Payer: Self-pay

## 2024-04-13 ENCOUNTER — Inpatient Hospital Stay

## 2024-04-13 ENCOUNTER — Other Ambulatory Visit

## 2024-04-13 ENCOUNTER — Inpatient Hospital Stay
Admission: EM | Admit: 2024-04-13 | Discharge: 2024-04-15 | DRG: 871 | Disposition: A | Discharge status: Hospice | Source: Skilled Nursing Facility | Attending: Internal Medicine | Admitting: Internal Medicine

## 2024-04-13 DIAGNOSIS — N1831 Chronic kidney disease, stage 3a: Secondary | ICD-10-CM | POA: Diagnosis present

## 2024-04-13 DIAGNOSIS — J9601 Acute respiratory failure with hypoxia: Secondary | ICD-10-CM | POA: Diagnosis present

## 2024-04-13 DIAGNOSIS — I5A Non-ischemic myocardial injury (non-traumatic): Secondary | ICD-10-CM | POA: Diagnosis present

## 2024-04-13 DIAGNOSIS — I5032 Chronic diastolic (congestive) heart failure: Secondary | ICD-10-CM | POA: Diagnosis present

## 2024-04-13 DIAGNOSIS — A4189 Other specified sepsis: Principal | ICD-10-CM | POA: Diagnosis present

## 2024-04-13 DIAGNOSIS — I2489 Other forms of acute ischemic heart disease: Secondary | ICD-10-CM | POA: Diagnosis present

## 2024-04-13 DIAGNOSIS — I482 Chronic atrial fibrillation, unspecified: Secondary | ICD-10-CM | POA: Diagnosis present

## 2024-04-13 DIAGNOSIS — N178 Other acute kidney failure: Secondary | ICD-10-CM | POA: Diagnosis not present

## 2024-04-13 DIAGNOSIS — N179 Acute kidney failure, unspecified: Secondary | ICD-10-CM | POA: Diagnosis present

## 2024-04-13 DIAGNOSIS — F32A Depression, unspecified: Secondary | ICD-10-CM | POA: Diagnosis present

## 2024-04-13 DIAGNOSIS — K56609 Unspecified intestinal obstruction, unspecified as to partial versus complete obstruction: Secondary | ICD-10-CM | POA: Diagnosis present

## 2024-04-13 DIAGNOSIS — J690 Pneumonitis due to inhalation of food and vomit: Secondary | ICD-10-CM | POA: Diagnosis present

## 2024-04-13 DIAGNOSIS — F419 Anxiety disorder, unspecified: Secondary | ICD-10-CM | POA: Diagnosis present

## 2024-04-13 DIAGNOSIS — R0602 Shortness of breath: Secondary | ICD-10-CM | POA: Diagnosis present

## 2024-04-13 DIAGNOSIS — Z515 Encounter for palliative care: Secondary | ICD-10-CM

## 2024-04-13 DIAGNOSIS — J189 Pneumonia, unspecified organism: Secondary | ICD-10-CM | POA: Diagnosis present

## 2024-04-13 DIAGNOSIS — G9341 Metabolic encephalopathy: Secondary | ICD-10-CM | POA: Diagnosis present

## 2024-04-13 DIAGNOSIS — R6521 Severe sepsis with septic shock: Secondary | ICD-10-CM | POA: Diagnosis present

## 2024-04-13 DIAGNOSIS — J440 Chronic obstructive pulmonary disease with acute lower respiratory infection: Secondary | ICD-10-CM | POA: Diagnosis present

## 2024-04-13 DIAGNOSIS — Z79890 Hormone replacement therapy (postmenopausal): Secondary | ICD-10-CM

## 2024-04-13 DIAGNOSIS — U071 COVID-19: Secondary | ICD-10-CM | POA: Diagnosis present

## 2024-04-13 DIAGNOSIS — Z66 Do not resuscitate: Secondary | ICD-10-CM | POA: Diagnosis present

## 2024-04-13 DIAGNOSIS — Z9049 Acquired absence of other specified parts of digestive tract: Secondary | ICD-10-CM

## 2024-04-13 DIAGNOSIS — E785 Hyperlipidemia, unspecified: Secondary | ICD-10-CM | POA: Diagnosis present

## 2024-04-13 DIAGNOSIS — Z888 Allergy status to other drugs, medicaments and biological substances status: Secondary | ICD-10-CM

## 2024-04-13 DIAGNOSIS — I480 Paroxysmal atrial fibrillation: Secondary | ICD-10-CM | POA: Diagnosis present

## 2024-04-13 DIAGNOSIS — Z79899 Other long term (current) drug therapy: Secondary | ICD-10-CM

## 2024-04-13 DIAGNOSIS — I10 Essential (primary) hypertension: Secondary | ICD-10-CM | POA: Diagnosis present

## 2024-04-13 DIAGNOSIS — K219 Gastro-esophageal reflux disease without esophagitis: Secondary | ICD-10-CM | POA: Diagnosis present

## 2024-04-13 DIAGNOSIS — Z90710 Acquired absence of both cervix and uterus: Secondary | ICD-10-CM

## 2024-04-13 DIAGNOSIS — I130 Hypertensive heart and chronic kidney disease with heart failure and stage 1 through stage 4 chronic kidney disease, or unspecified chronic kidney disease: Secondary | ICD-10-CM | POA: Diagnosis present

## 2024-04-13 DIAGNOSIS — E039 Hypothyroidism, unspecified: Secondary | ICD-10-CM | POA: Diagnosis present

## 2024-04-13 DIAGNOSIS — A419 Sepsis, unspecified organism: Secondary | ICD-10-CM | POA: Diagnosis not present

## 2024-04-13 DIAGNOSIS — Z7951 Long term (current) use of inhaled steroids: Secondary | ICD-10-CM

## 2024-04-13 DIAGNOSIS — Z538 Procedure and treatment not carried out for other reasons: Secondary | ICD-10-CM | POA: Diagnosis not present

## 2024-04-13 DIAGNOSIS — J1282 Pneumonia due to coronavirus disease 2019: Secondary | ICD-10-CM | POA: Diagnosis present

## 2024-04-13 DIAGNOSIS — Z882 Allergy status to sulfonamides status: Secondary | ICD-10-CM

## 2024-04-13 DIAGNOSIS — Z7901 Long term (current) use of anticoagulants: Secondary | ICD-10-CM

## 2024-04-13 LAB — COMPREHENSIVE METABOLIC PANEL WITH GFR
ALT: 16 U/L (ref 0–44)
AST: 52 U/L — ABNORMAL HIGH (ref 15–41)
Albumin: 3.5 g/dL (ref 3.5–5.0)
Alkaline Phosphatase: 60 U/L (ref 38–126)
Anion gap: 18 — ABNORMAL HIGH (ref 5–15)
BUN: 59 mg/dL — ABNORMAL HIGH (ref 8–23)
CO2: 24 mmol/L (ref 22–32)
Calcium: 9.6 mg/dL (ref 8.9–10.3)
Chloride: 94 mmol/L — ABNORMAL LOW (ref 98–111)
Creatinine, Ser: 2.92 mg/dL — ABNORMAL HIGH (ref 0.44–1.00)
GFR, Estimated: 15 mL/min — ABNORMAL LOW (ref >60)
Glucose, Bld: 129 mg/dL — ABNORMAL HIGH (ref 70–99)
Potassium: 5 mmol/L (ref 3.5–5.1)
Sodium: 136 mmol/L (ref 135–145)
Total Bilirubin: 0.7 mg/dL (ref 0.0–1.2)
Total Protein: 7.7 g/dL (ref 6.5–8.1)

## 2024-04-13 LAB — TROPONIN I (HIGH SENSITIVITY)
Troponin I (High Sensitivity): 175 ng/L (ref <18)
Troponin I (High Sensitivity): 185 ng/L (ref <18)

## 2024-04-13 LAB — CBC WITH DIFFERENTIAL/PLATELET
Abs Immature Granulocytes: 0.06 K/uL (ref 0.00–0.07)
Basophils Absolute: 0 K/uL (ref 0.0–0.1)
Basophils Relative: 0 %
Eosinophils Absolute: 0 K/uL (ref 0.0–0.5)
Eosinophils Relative: 0 %
HCT: 46.9 % — ABNORMAL HIGH (ref 36.0–46.0)
Hemoglobin: 15.8 g/dL — ABNORMAL HIGH (ref 12.0–15.0)
Immature Granulocytes: 1 %
Lymphocytes Relative: 18 %
Lymphs Abs: 2.2 K/uL (ref 0.7–4.0)
MCH: 30.3 pg (ref 26.0–34.0)
MCHC: 33.7 g/dL (ref 30.0–36.0)
MCV: 90 fL (ref 80.0–100.0)
Monocytes Absolute: 1.2 K/uL — ABNORMAL HIGH (ref 0.1–1.0)
Monocytes Relative: 10 %
Neutro Abs: 8.9 K/uL — ABNORMAL HIGH (ref 1.7–7.7)
Neutrophils Relative %: 71 %
Platelets: 274 K/uL (ref 150–400)
RBC: 5.21 MIL/uL — ABNORMAL HIGH (ref 3.87–5.11)
RDW: 13.3 % (ref 11.5–15.5)
WBC: 12.3 K/uL — ABNORMAL HIGH (ref 4.0–10.5)
nRBC: 0 % (ref 0.0–0.2)

## 2024-04-13 LAB — RESP PANEL BY RT-PCR (RSV, FLU A&B, COVID)  RVPGX2
Influenza A by PCR: NEGATIVE
Influenza B by PCR: NEGATIVE
Resp Syncytial Virus by PCR: NEGATIVE
SARS Coronavirus 2 by RT PCR: POSITIVE — AB

## 2024-04-13 LAB — PROTIME-INR
INR: 1.1 (ref 0.8–1.2)
Prothrombin Time: 14.5 s (ref 11.4–15.2)

## 2024-04-13 LAB — LACTIC ACID, PLASMA
Lactic Acid, Venous: 2.3 mmol/L (ref 0.5–1.9)
Lactic Acid, Venous: 1.8 mmol/L (ref 0.5–1.9)

## 2024-04-13 LAB — BRAIN NATRIURETIC PEPTIDE: B Natriuretic Peptide: 476 pg/mL — ABNORMAL HIGH (ref 0.0–100.0)

## 2024-04-13 LAB — PROCALCITONIN: Procalcitonin: 2.11 ng/mL

## 2024-04-13 MED ORDER — METOCLOPRAMIDE HCL 5 MG/ML IJ SOLN
5.0000 mg | Freq: Four times a day (QID) | INTRAMUSCULAR | Status: DC
  Administered 2024-04-14 – 2024-04-15 (×4): 5 mg via INTRAVENOUS
  Filled 2024-04-13 (×5): qty 2

## 2024-04-13 MED ORDER — IPRATROPIUM-ALBUTEROL 0.5-2.5 (3) MG/3ML IN SOLN: 3.0000 mL | Freq: Two times a day (BID) | RESPIRATORY_TRACT | Status: DC

## 2024-04-13 MED ORDER — HEPARIN SODIUM (PORCINE) 5000 UNIT/ML IJ SOLN
5000.0000 [IU] | Freq: Two times a day (BID) | INTRAMUSCULAR | Status: DC
  Administered 2024-04-13: 5000 [IU] via SUBCUTANEOUS
  Filled 2024-04-13: qty 1

## 2024-04-13 MED ORDER — LACTATED RINGERS IV BOLUS (SEPSIS)
500.0000 mL | Freq: Once | INTRAVENOUS | Status: AC
  Administered 2024-04-13: 500 mL via INTRAVENOUS

## 2024-04-13 MED ORDER — POLYETHYLENE GLYCOL 3350 17 G PO PACK: 17.0000 g | PACK | ORAL | Status: DC

## 2024-04-13 MED ORDER — IPRATROPIUM-ALBUTEROL 0.5-2.5 (3) MG/3ML IN SOLN
3.0000 mL | Freq: Four times a day (QID) | RESPIRATORY_TRACT | Status: DC
  Administered 2024-04-13 – 2024-04-14 (×5): 3 mL via RESPIRATORY_TRACT
  Filled 2024-04-13 (×4): qty 3
  Filled 2024-04-13: qty 42

## 2024-04-13 MED ORDER — SODIUM CHLORIDE 0.9 % IV SOLN: 2.0000 g | INTRAVENOUS | Status: DC

## 2024-04-13 MED ORDER — SODIUM CHLORIDE 0.9 % IV SOLN: INTRAVENOUS | Status: DC

## 2024-04-13 MED ORDER — SODIUM CHLORIDE 0.9 % IV SOLN
100.0000 mg | Freq: Two times a day (BID) | INTRAVENOUS | Status: DC
  Administered 2024-04-13: 100 mg via INTRAVENOUS
  Filled 2024-04-13 (×2): qty 100

## 2024-04-13 MED ORDER — BUDESON-GLYCOPYRROL-FORMOTEROL 160-9-4.8 MCG/ACT IN AERO
2.0000 | INHALATION_SPRAY | Freq: Two times a day (BID) | RESPIRATORY_TRACT | Status: DC
  Filled 2024-04-13: qty 5.9

## 2024-04-13 MED ORDER — HYDROMORPHONE HCL 1 MG/ML IJ SOLN
0.5000 mg | INTRAMUSCULAR | Status: DC | PRN
  Administered 2024-04-14: 0.5 mg via INTRAVENOUS
  Filled 2024-04-13: qty 0.5

## 2024-04-13 MED ORDER — ONDANSETRON HCL 4 MG PO TABS: 4.0000 mg | ORAL_TABLET | Freq: Four times a day (QID) | ORAL | Status: DC | PRN

## 2024-04-13 MED ORDER — ONDANSETRON HCL 4 MG/2ML IJ SOLN: 4.0000 mg | Freq: Four times a day (QID) | INTRAMUSCULAR | Status: DC | PRN

## 2024-04-13 MED ORDER — SODIUM CHLORIDE 0.9 % IV SOLN
1.0000 g | Freq: Once | INTRAVENOUS | Status: AC
  Administered 2024-04-13: 1 g via INTRAVENOUS
  Filled 2024-04-13: qty 10

## 2024-04-13 MED ORDER — SENNA 8.6 MG PO TABS: 2.0000 | ORAL_TABLET | Freq: Every day | ORAL | Status: DC

## 2024-04-13 MED ORDER — ACETAMINOPHEN 325 MG PO TABS
650.0000 mg | ORAL_TABLET | Freq: Three times a day (TID) | ORAL | Status: DC | PRN
  Administered 2024-04-13: 650 mg via ORAL
  Filled 2024-04-13 (×2): qty 2

## 2024-04-13 MED ORDER — SODIUM CHLORIDE 0.9 % IV SOLN
100.0000 mg | Freq: Once | INTRAVENOUS | Status: AC
  Administered 2024-04-13: 100 mg via INTRAVENOUS
  Filled 2024-04-13: qty 100

## 2024-04-13 NOTE — ED Provider Notes (Signed)
 Abigail Strong Provider Note    Event Date/Time   First MD Initiated Contact with Patient 04/13/24 1217     (approximate)   History   Shortness of Breath   HPI  Abigail Strong is a 89 y.o. female with history of hypertension, hyperlipidemia, GERD, presenting with shortness of breath.  Denies any chest pain.  No nausea vomiting or diarrhea.  No abdominal pain.    Per independent history from EMS, she is presenting from skilled nursing facility for tachypnea as well as generalized weakness.  Was found to be COVID-positive 3 days ago.  Is on 3 L nasal cannula chronically.    On independent review, she was admitted in April for generalized weakness and hyponatremia, hyponatremia thought to be secondary to torsemide .  She improved to sodium of 132 with salt tabs.  Also with history of CHF and grade 1 diastolic dysfunction.   Physical Exam   Triage Vital Signs: ED Triage Vitals  Encounter Vitals Group     BP      Girls Systolic BP Percentile      Girls Diastolic BP Percentile      Boys Systolic BP Percentile      Boys Diastolic BP Percentile      Pulse      Resp      Temp      Temp src      SpO2      Weight      Height      Head Circumference      Peak Flow      Pain Score      Pain Loc      Pain Education      Exclude from Growth Chart     Most recent vital signs: Vitals:   04/13/24 1221 04/13/24 1240  BP: 95/71 95/67  Pulse: 99 100  Resp: (!) 40 (!) 39  Temp: 97.6 F (36.4 C)   SpO2: 97% 95%     General: Awake, no distress.  CV:  Good peripheral perfusion.  Resp:  Normal effort.  Tachypneic, no wheezing, faint crackles on the right Abd:  No distention.  Soft nontender Other:  Mildly dry mucous membranes.   ED Results / Procedures / Treatments   Labs (all labs ordered are listed, but only abnormal results are displayed) Labs Reviewed  LACTIC ACID, PLASMA - Abnormal; Notable for the following components:      Result Value    Lactic Acid, Venous 2.3 (*)    All other components within normal limits  COMPREHENSIVE METABOLIC PANEL WITH GFR - Abnormal; Notable for the following components:   Chloride 94 (*)    Glucose, Bld 129 (*)    BUN 59 (*)    Creatinine, Ser 2.92 (*)    AST 52 (*)    GFR, Estimated 15 (*)    Anion gap 18 (*)    All other components within normal limits  BRAIN NATRIURETIC PEPTIDE - Abnormal; Notable for the following components:   B Natriuretic Peptide 476.0 (*)    All other components within normal limits  CBC WITH DIFFERENTIAL/PLATELET - Abnormal; Notable for the following components:   WBC 12.3 (*)    RBC 5.21 (*)    Hemoglobin 15.8 (*)    HCT 46.9 (*)    Neutro Abs 8.9 (*)    Monocytes Absolute 1.2 (*)    All other components within normal limits  TROPONIN I (HIGH SENSITIVITY) - Abnormal; Notable  for the following components:   Troponin I (High Sensitivity) 175 (*)    All other components within normal limits  RESP PANEL BY RT-PCR (RSV, FLU A&B, COVID)  RVPGX2  CULTURE, BLOOD (ROUTINE X 2)  CULTURE, BLOOD (ROUTINE X 2)  PROTIME-INR  LACTIC ACID, PLASMA  CBC WITH DIFFERENTIAL/PLATELET  URINALYSIS, W/ REFLEX TO CULTURE (INFECTION SUSPECTED)     EKG  EKG shows, sinus tachycardia, rate 122, normal QS, baseline is wandering, T wave flattening in 1, aVL, no obvious ischemic ST elevation, T wave changes new compared to prior   RADIOLOGY On my independent interpretation, checks x-ray patient is rotated but do see an opacity on the right   PROCEDURES:  Critical Care performed: Yes, see critical care procedure note(s)  .Critical Care  Performed by: Waymond Lorelle Cummins, MD Authorized by: Waymond Lorelle Cummins, MD   Critical care provider statement:    Critical care time (minutes):  40   Critical care was necessary to treat or prevent imminent or life-threatening deterioration of the following conditions:  Sepsis   Critical care was time spent personally by me on the following  activities:  Development of treatment plan with patient or surrogate, discussions with consultants, evaluation of patient's response to treatment, examination of patient, ordering and review of laboratory studies, ordering and review of radiographic studies, ordering and performing treatments and interventions, pulse oximetry, re-evaluation of patient's condition and review of old charts    MEDICATIONS ORDERED IN ED: Medications  lactated ringers  bolus 500 mL (500 mLs Intravenous New Bag/Given 04/13/24 1241)  cefTRIAXone  (ROCEPHIN ) 1 g in sodium chloride  0.9 % 100 mL IVPB (1 g Intravenous New Bag/Given 04/13/24 1325)  doxycycline  (VIBRAMYCIN ) 100 mg in sodium chloride  0.9 % 250 mL IVPB (has no administration in time range)     IMPRESSION / MDM / ASSESSMENT AND PLAN / ED COURSE  I reviewed the triage vital signs and the nursing notes.                              Differential diagnosis includes, but is not limited to, sepsis, COVID, viral illness, pneumonia, atypical ACS, electrolyte derangements, dehydration.  Get labs, EKG, troponin, chest x-ray, blood cultures, lactic acid, IV fluids.  Will hold off antibiotics at this time given that suspect sepsis is secondary to her COVID.  She is satting high 90s on her 3 L nasal cannula.  Patient's presentation is most consistent with acute presentation with potential threat to life or bodily function.  Independent interpretation of labs and imaging below.  Given chest x-ray findings, we will start her on antibiotics for pneumonia, IV ceftriaxone  and doxycycline .  Troponin is elevated but she has no chest pain, suspect this might be due to demand ischemia.  She will need to be admitted for further management.  Consult to hospitalist was agreeable with the plan for admission and will evaluate the patient.  She is admitted.  The patient is on the cardiac monitor to evaluate for evidence of arrhythmia and/or significant heart rate changes.   Clinical  Course as of 04/13/24 1350  Mon Apr 13, 2024  1313 Independent review of labs, lactate is elevated, mild leukocytosis.  She also has an AKI, AST is mildly elevated, rest of LFTs are normal, electrolytes not severely deranged.  Troponin is elevated, BNP is elevated. [TT]  1350 DG Chest Port 1 View IMPRESSION: 1. Marked gaseous distention of the stomach. 2. Left lower lung  atelectasis or infiltrates.   [TT]    Clinical Course User Index [TT] Waymond Lorelle Cummins, MD     FINAL CLINICAL IMPRESSION(S) / ED DIAGNOSES   Final diagnoses:  Shortness of breath  Sepsis, due to unspecified organism, unspecified whether acute organ dysfunction present (HCC)  COVID  Pneumonia due to infectious organism, unspecified laterality, unspecified part of lung  AKI (acute kidney injury) (HCC)     Rx / DC Orders   ED Discharge Orders     None        Note:  This document was prepared using Dragon voice recognition software and may include unintentional dictation errors.    Waymond Lorelle Cummins, MD 04/13/24 [id number]

## 2024-04-13 NOTE — Progress Notes (Signed)
 SLP Cancellation Note  Patient Details Name: NYALA KIRCHNER MRN: [id number] DOB: December 02, 1929   Cancelled treatment:       Reason Eval/Treat Not Completed: Medical issues which prohibited therapy;Patient not medically ready (chart reviewed)  Pt is a 34 female who has been admitted to this ED for ~2 hours. She presents from her SNF for tachypnea as well as generalized weakness. Was found to be COVID-positive ~3 days ago. She is on 3 L nasal cannula chronically, and at this admit.  Currently, she is tachypneic w/ RR 38-45 since checked(4x) at 1240. There is increased risk for aspiration of any po intake w/ increased RR. ST services will hold on evaluation at this time; recommend continued NPO status. Will f/u tomorrow when pt's medical status is stabilized. Recommend frequent oral care for hygiene and stimulation of swallowing.      Comer Portugal, MS, CCC-SLP Speech Language Pathologist Rehab Services; [facility] [phone] (ascom) Kevionna Heffler 04/13/2024, 2:53 PM

## 2024-04-13 NOTE — ED Notes (Signed)
 Pt states that she has a little bit of back pain, tylenol  given, pt then seems to choke on the water and spit up a small amount, md notified and requests placed for SLP consult, pt satting 91% on 3L

## 2024-04-13 NOTE — Progress Notes (Signed)
 04/13/24  Discussed case with Dr. Laurita.  Patient admitted with COVID, pneumonia, and AKI.  Also having nausea and vomiting.  CT scan of abdomen/pelvis showed small bowel obstruction with a clear transition point in the mid abdomen.  She does have history of partial colectomy and hysterectomy.  Discussed with Dr. Laurita about recommendation for NG tube placement.  Will follow with you for conservative management.  KUB ordered for tomorrow to eval progress.  Per Dr. Laurita, patient would not be interested in surgery.  Full note to follow in AM.  Aloysius Plant, MD

## 2024-04-13 NOTE — ED Notes (Signed)
 This NT changed pts brief and repositioned pt. Pt is now resting comfortably in bed.

## 2024-04-13 NOTE — H&P (Addendum)
 History and Physical    Abigail Strong:[id number] DOB: [birth year] DOA: 04/13/2024  PCP: Fernand Fredy RAMAN, MD (Confirm with patient/family/NH records and if not entered, this has to be entered at [facility] point of entry) Patient coming from: SNF  I have personally briefly reviewed patient's old medical records in [facility]  Chief Complaint: SOB, AMS  HPI: Abigail Strong is a 89 y.o. female with medical history significant of PAF on Eliquis , COPD, chronic HFpEF, hypothyroidism, HTN, anxiety/depression, GERD, sent from nursing home for evaluation of new onset of dyspnea and altered mentations.  Patient in severe shortness of breath unable to provide much history, all history provided by daughter and granddaughter at bedside.  Family contact patient last Friday and Saturday and was told that everything was fine.  On Wednesday, nursing home did screening test of COVID on all residents and found patient positive.  At that point patient did not have any symptoms of shortness of breath or other URI symptoms.  Patient however reported last 2 days she has been feeling nauseous and decreased oral intake and this morning patient was found to be in severe respiratory distress and confused.  No fever documented.  ED Course: Afebrile, tachycardia tachypneic blood pressure 105/73 O2 saturation 94% on 3 L.  Chest x-ray showed possible bilateral lower field infiltrates, blood work showed hemoglobin 15.8 compared to baseline 9.0-10.0 BUN 59 creatinine 2.9 compared to baseline 1.1, bicarb 24K 5.0 lactic acid 2.3.  Patient was treated with ceftriaxone  and Doxy  Review of Systems: Unable to perform, patient has severe respiratory distress.  Past Medical History:  Diagnosis Date   Depression    GERD (gastroesophageal reflux disease)    Hyperlipidemia    Hyperlipidemia, unspecified 01/01/2014   Hypothyroidism    Insomnia    Musculoskeletal chest pain 09/27/2016    Past Surgical History:  Procedure  Laterality Date   ABDOMINAL HYSTERECTOMY     BREAST BIOPSY Bilateral 90's   benign   cataracts     COLON SURGERY     partial colectomy   intestinal blockage     NISSEN FUNDOPLICATION       reports that she has never smoked. She has never used smokeless tobacco. She reports that she does not drink alcohol and does not use drugs.  Allergies  Allergen Reactions   Gabapentin  Other (See Comments)    Dizziness    Sulfa Antibiotics Other (See Comments)   Sulfonamide Derivatives     Family History  Problem Relation Age of Onset   Cervical cancer Mother    Breast cancer Maternal Aunt      Prior to Admission medications   Medication Sig Start Date End Date Taking? Authorizing Provider  acetaminophen  (TYLENOL ) 325 MG tablet Take 650 mg by mouth every 8 (eight) hours as needed for mild pain (pain score 1-3) or moderate pain (pain score 4-6).   Yes [provider]  Baclofen 5 MG TABS Take 1 tablet by mouth 2 (two) times daily as needed. 04/09/24  Yes [provider]  busPIRone (BUSPAR) 5 MG tablet Take 5 mg by mouth 2 (two) times daily. 03/06/24  Yes [provider]  calcium  carbonate (TUMS - DOSED IN MG ELEMENTAL CALCIUM ) 500 MG chewable tablet Chew 2 tablets by mouth 3 (three) times daily as needed for indigestion or heartburn.   Yes [provider]  Cholecalciferol  50 MCG (2000 UT) CAPS Take 2,000 Units by mouth daily.   Yes [provider]  citalopram  (  CELEXA ) 20 MG tablet Take 1 tablet (20 mg total) by mouth daily. 10/28/23 04/13/24 Yes Trudy Anthony HERO, MD  diclofenac Sodium (VOLTAREN) 1 % GEL Apply topically 2 (two) times daily.   Yes [provider]  ferrous sulfate  325 (65 FE) MG EC tablet Take 325 mg by mouth daily with breakfast.   Yes [provider]  fluticasone  (FLONASE ) 50 MCG/ACT nasal spray Place 1 spray into both nostrils daily. 03/08/23  Yes Fernand Fredy RAMAN, MD  guaiFENesin  (ROBITUSSIN) 100 MG/5ML liquid Take 10  mLs by mouth every 4 (four) hours as needed for cough or to loosen phlegm.   Yes [provider]  ipratropium-albuterol  (DUONEB) 0.5-2.5 (3) MG/3ML SOLN Inhale into the lungs 2 (two) times daily. 04/06/24  Yes [provider]  Iron -FA-B Cmp-C-Biot-Probiotic (FUSION PLUS) CAPS Take 1 capsule by mouth daily. 03/20/23  Yes Orlean Alan HERO, FNP  levothyroxine  (LEVOXYL ) 112 MCG tablet Take 1 tablet (112 mcg total) by mouth daily before breakfast. 09/27/23  Yes Fernand Fredy RAMAN, MD  pantoprazole  (PROTONIX ) 40 MG tablet TAKE 1 TABLET(40 MG) BY MOUTH DAILY 12/12/23  Yes Fernand Fredy RAMAN, MD  polyethylene glycol (MIRALAX  / GLYCOLAX ) 17 g packet Take 17 g by mouth every other day. 11/30/23  Yes Darci Pore, MD  ranolazine  (RANEXA ) 500 MG 12 hr tablet Take 1 tablet (500 mg total) by mouth 2 (two) times daily. 07/02/23  Yes Fernand Fredy RAMAN, MD  rosuvastatin  (CRESTOR ) 20 MG tablet Take 1 tablet (20 mg total) by mouth daily. 10/04/22  Yes Fernand Fredy RAMAN, MD  senna (SENOKOT) 8.6 MG TABS tablet Take 2 tablets by mouth at bedtime.   Yes [provider]  SODIUM CHLORIDE  PO Take 1,000 mg by mouth in the morning, at noon, and at bedtime.   Yes [provider]  torsemide  (DEMADEX ) 10 MG tablet Take 1 tablet (10 mg total) by mouth daily as needed (Edema, fluid overload). 11/29/23  Yes Sreeram, Pore, MD  TRELEGY ELLIPTA 100-62.5-25 MCG/ACT AEPB Inhale 1 puff into the lungs daily. 03/16/24  Yes [provider]  benzonatate  (TESSALON ) 100 MG capsule Take 1 capsule (100 mg total) by mouth 3 (three) times daily as needed for cough. Patient not taking: Reported on 04/13/2024 11/29/23   Darci Pore, MD  ELIQUIS  2.5 MG TABS tablet Take 1 tablet (2.5 mg total) by mouth 2 (two) times daily. Patient not taking: Reported on 04/13/2024 05/28/23   Fernand Denyse LABOR, MD  fenofibrate  54 MG tablet TAKE 1 TABLET(54 MG) BY MOUTH DAILY Patient not taking: Reported on 04/13/2024 12/12/23    Fernand Fredy RAMAN, MD  hydrOXYzine  (ATARAX ) 25 MG tablet TAKE 1 TABLET(25 MG) BY MOUTH EVERY 6 HOURS AS NEEDED Patient not taking: Reported on 04/13/2024 09/30/23   Fernand Fredy RAMAN, MD  meclizine  (ANTIVERT ) 12.5 MG tablet Take 1 tablet (12.5 mg total) by mouth 2 (two) times daily as needed for dizziness. Patient not taking: Reported on 04/13/2024 11/29/23   Darci Pore, MD    Physical Exam: Vitals:   04/13/24 1240 04/13/24 1300 04/13/24 1330 04/13/24 1440  BP: 95/67 105/73 114/71 106/81  Pulse: 100 (!) 119 95 (!) 9  Resp: (!) 39 (!) 45 (!) 41 (!) 38  Temp:    97.7 F (36.5 C)  TempSrc:    Oral  SpO2: 95% 94% 94% 94%  Weight:      Height:        Constitutional: NAD, calm, comfortable Vitals:   04/13/24 1240 04/13/24 1300 04/13/24  1330 04/13/24 1440  BP: 95/67 105/73 114/71 106/81  Pulse: 100 (!) 119 95 (!) 9  Resp: (!) 39 (!) 45 (!) 41 (!) 38  Temp:    97.7 F (36.5 C)  TempSrc:    Oral  SpO2: 95% 94% 94% 94%  Weight:      Height:       Eyes: PERRL, lids and conjunctivae normal ENMT: Mucous membranes are dry. Posterior pharynx clear of any exudate or lesions.Normal dentition.  Neck: normal, supple, no masses, no thyromegaly Respiratory: clear to auscultation bilaterally, no wheezing, crackles on right side, increasing respiratory effort. No accessory muscle use.  Cardiovascular: Regular rate and rhythm, no murmurs / rubs / gallops. No extremity edema. 2+ pedal pulses. No carotid bruits.  Abdomen: no tenderness, no masses palpated. No hepatosplenomegaly. Bowel sounds positive.  Musculoskeletal: no clubbing / cyanosis. No joint deformity upper and lower extremities. Good ROM, no contractures. Normal muscle tone.  Skin: no rashes, lesions, ulcers. No induration Neurologic: CN 2-12 grossly intact. Sensation intact, DTR normal. Strength 5/5 in all 4.  Psychiatric: Lethargic, confused    Labs on Admission: I have personally reviewed following labs and imaging  studies  CBC: Recent Labs  Lab 04/13/24 1236  WBC 12.3*  NEUTROABS 8.9*  HGB 15.8*  HCT 46.9*  MCV 90.0  PLT 274   Basic Metabolic Panel: Recent Labs  Lab 04/13/24 1234  NA 136  K 5.0  CL 94*  CO2 24  GLUCOSE 129*  BUN 59*  CREATININE 2.92*  CALCIUM  9.6   GFR: Estimated Creatinine Clearance: 11.3 mL/min (A) (by C-G formula based on SCr of 2.92 mg/dL (H)). Liver Function Tests: Recent Labs  Lab 04/13/24 1234  AST 52*  ALT 16  ALKPHOS 60  BILITOT 0.7  PROT 7.7  ALBUMIN 3.5   No results for input(s): LIPASE, AMYLASE in the last 168 hours. No results for input(s): AMMONIA in the last 168 hours. Coagulation Profile: Recent Labs  Lab 04/13/24 1305  INR 1.1   Cardiac Enzymes: No results for input(s): CKTOTAL, CKMB, CKMBINDEX, TROPONINI in the last 168 hours. BNP (last 3 results) No results for input(s): PROBNP in the last 8760 hours. HbA1C: No results for input(s): HGBA1C in the last 72 hours. CBG: No results for input(s): GLUCAP in the last 168 hours. Lipid Profile: No results for input(s): CHOL, HDL, LDLCALC, TRIG, CHOLHDL, LDLDIRECT in the last 72 hours. Thyroid  Function Tests: No results for input(s): TSH, T4TOTAL, FREET4, T3FREE, THYROIDAB in the last 72 hours. Anemia Panel: No results for input(s): VITAMINB12, FOLATE, FERRITIN, TIBC, IRON , RETICCTPCT in the last 72 hours. Urine analysis:    Component Value Date/Time   COLORURINE YELLOW (A) 11/23/2023 2354   APPEARANCEUR HAZY (A) 11/23/2023 2354   APPEARANCEUR Cloudy (A) 09/07/2021 1133   LABSPEC 1.005 11/23/2023 2354   PHURINE 8.0 11/23/2023 2354   GLUCOSEU NEGATIVE 11/23/2023 2354   HGBUR SMALL (A) 11/23/2023 2354   BILIRUBINUR NEGATIVE 11/23/2023 2354   BILIRUBINUR Negative 11/08/2023 1136   BILIRUBINUR Negative 09/07/2021 1133   KETONESUR NEGATIVE 11/23/2023 2354   PROTEINUR NEGATIVE 11/23/2023 2354   UROBILINOGEN 0.2 11/08/2023 1136    NITRITE NEGATIVE 11/23/2023 2354   LEUKOCYTESUR NEGATIVE 11/23/2023 2354    Radiological Exams on Admission: DG Chest Port 1 View Result Date: 04/13/2024 CLINICAL DATA:  Questionable sepsis-evaluate for abnormality. Patient unable to hold still for imaging. Multiple attempts were made. Best obtainable images. EXAM: PORTABLE CHEST 1 VIEW COMPARISON:  11/28/2023 FINDINGS: Low lung volumes and  patient positioning limits assessment. Grossly stable cardiomediastinal silhouette. Low lung volumes accentuate pulmonary vascularity. Left lower lung atelectasis or infiltrates. Evidence of pleural effusion or pneumothorax. Marked gaseous distention of the stomach. IMPRESSION: 1. Marked gaseous distention of the stomach. 2. Left lower lung atelectasis or infiltrates. Electronically Signed   By: Norman Gatlin M.D.   On: 04/13/2024 13:29    EKG: Independently reviewed.  Sinus rhythm, chronic incomplete LBBB  Assessment/Plan Principal Problem:   Pneumonia Active Problems:   Acute respiratory failure with hypoxia (HCC)   CAP (community acquired pneumonia)  (please populate well all problems here in Problem List. (For example, if patient is on BP meds at home and you resume or decide to hold them, it is a problem that needs to be her. Same for CAD, COPD, HLD and so on)  Acute hypoxic respiratory failure - Clinical suspect postviral pneumonia/CAP - Continue ceftriaxone  and doxycycline  - Other supportive care, incentive spirometry and flutter valve - CT chest without contrast to further characterize infiltrates versus other changes such as fluid - Confirmed with daughter patient is DNR/DNI  AKI on CKD stage IIIa - Appears to be volume contracted, and patient also has significant hemoconcentration - IV fluid for short time - Renal ultrasound  Acute metabolic encephalopathy - Likely secondary to pneumonia - As per speech therapist, plan to hold off all p.o. medications today and hold off swallow  evaluation.  Continue n.p.o. to prevent aspiration - Aspiration precaution, HOB 30 degree  Chronic HFpEF - Volume contracted - Hold off diuresis - Hold off home BP meds  Hypothyroidism - Check TSH level - Continue Synthroid  once able to take p.o.  Anxiety/depression - Hold off SSRI today  PAF - In sinus rhythm, - Continue Eliquis   Total time spent on patient care 55 minutes with  DVT prophylaxis: Eliquis  Code Status: DNR/DNI Family Communication: Daughter and granddaughter at bedside Disposition Plan: Patient is sick with acute hypoxic respiratory failure and CAP requiring IV antibiotics, expect more than 2 midnight hospital stay Consults called: None Admission status: PCU admit   Cort ONEIDA Mana MD [facility] Pager [phone]  04/13/2024, 2:58 PM

## 2024-04-13 NOTE — Sepsis Progress Note (Signed)
 Elink will follow per sepsis protocol.

## 2024-04-13 NOTE — ED Triage Notes (Signed)
 Pt to er via ems, per ems pt is here for shortness of breath, states that she tested positive for covid three days ago, states [facility], pt has elevated RR.

## 2024-04-13 NOTE — Progress Notes (Addendum)
 CT abd showed high grade SBO. Surveyor, minerals.  D/W family including granddaughter over phone.  I then went back to see the patient, patient now appears to be more discomfort and abd more distended. Bowel sound weak.   After D/W family, decision is made to go ahead with NGT insertion. D/W nurse and surgeon.

## 2024-04-13 NOTE — ED Notes (Signed)
Pt in bed, pt reports decreased pain.  °

## 2024-04-13 NOTE — ED Notes (Signed)
 Attempted NG tube 3 times and meeting resistance each time. Dr. Laurita aware. Ng tube removed

## 2024-04-14 ENCOUNTER — Inpatient Hospital Stay

## 2024-04-14 DIAGNOSIS — R6521 Severe sepsis with septic shock: Secondary | ICD-10-CM

## 2024-04-14 DIAGNOSIS — A419 Sepsis, unspecified organism: Secondary | ICD-10-CM | POA: Insufficient documentation

## 2024-04-14 DIAGNOSIS — N1831 Chronic kidney disease, stage 3a: Secondary | ICD-10-CM

## 2024-04-14 DIAGNOSIS — K56609 Unspecified intestinal obstruction, unspecified as to partial versus complete obstruction: Secondary | ICD-10-CM | POA: Diagnosis not present

## 2024-04-14 DIAGNOSIS — U071 COVID-19: Secondary | ICD-10-CM | POA: Insufficient documentation

## 2024-04-14 DIAGNOSIS — I5A Non-ischemic myocardial injury (non-traumatic): Secondary | ICD-10-CM | POA: Insufficient documentation

## 2024-04-14 DIAGNOSIS — J9601 Acute respiratory failure with hypoxia: Secondary | ICD-10-CM | POA: Diagnosis not present

## 2024-04-14 DIAGNOSIS — N179 Acute kidney failure, unspecified: Secondary | ICD-10-CM | POA: Insufficient documentation

## 2024-04-14 DIAGNOSIS — N178 Other acute kidney failure: Secondary | ICD-10-CM

## 2024-04-14 LAB — URINALYSIS, W/ REFLEX TO CULTURE (INFECTION SUSPECTED)
Bilirubin Urine: NEGATIVE
Glucose, UA: NEGATIVE mg/dL
Hgb urine dipstick: NEGATIVE
Ketones, ur: NEGATIVE mg/dL
Nitrite: NEGATIVE
Protein, ur: 100 mg/dL — AB
Specific Gravity, Urine: 1.021 (ref 1.005–1.030)
WBC, UA: >50 WBC/hpf (ref 0–5)
pH: 5 (ref 5.0–8.0)

## 2024-04-14 LAB — STREP PNEUMONIAE URINARY ANTIGEN: Strep Pneumo Urinary Antigen: NEGATIVE

## 2024-04-14 LAB — PROTIME-INR
INR: 1.2 (ref 0.8–1.2)
Prothrombin Time: 15.5 s — ABNORMAL HIGH (ref 11.4–15.2)

## 2024-04-14 LAB — BASIC METABOLIC PANEL WITH GFR
Anion gap: 15 (ref 5–15)
BUN: 73 mg/dL — ABNORMAL HIGH (ref 8–23)
CO2: 23 mmol/L (ref 22–32)
Calcium: 7.7 mg/dL — ABNORMAL LOW (ref 8.9–10.3)
Chloride: 103 mmol/L (ref 98–111)
Creatinine, Ser: 3.49 mg/dL — ABNORMAL HIGH (ref 0.44–1.00)
GFR, Estimated: 12 mL/min — ABNORMAL LOW (ref >60)
Glucose, Bld: 87 mg/dL (ref 70–99)
Potassium: 4 mmol/L (ref 3.5–5.1)
Sodium: 141 mmol/L (ref 135–145)

## 2024-04-14 LAB — APTT: aPTT: 29 s (ref 24–36)

## 2024-04-14 LAB — BRAIN NATRIURETIC PEPTIDE: B Natriuretic Peptide: 282 pg/mL — ABNORMAL HIGH (ref 0.0–100.0)

## 2024-04-14 LAB — CBC
HCT: 38.8 % (ref 36.0–46.0)
Hemoglobin: 12.6 g/dL (ref 12.0–15.0)
MCH: 30.4 pg (ref 26.0–34.0)
MCHC: 32.5 g/dL (ref 30.0–36.0)
MCV: 93.5 fL (ref 80.0–100.0)
Platelets: 205 K/uL (ref 150–400)
RBC: 4.15 MIL/uL (ref 3.87–5.11)
RDW: 13.6 % (ref 11.5–15.5)
WBC: 8.3 K/uL (ref 4.0–10.5)
nRBC: 0 % (ref 0.0–0.2)

## 2024-04-14 LAB — LACTATE DEHYDROGENASE: LDH: 198 U/L — ABNORMAL HIGH (ref 98–192)

## 2024-04-14 LAB — TROPONIN I (HIGH SENSITIVITY)
Troponin I (High Sensitivity): 245 ng/L (ref <18)
Troponin I (High Sensitivity): 393 ng/L (ref <18)

## 2024-04-14 MED ORDER — HEPARIN SODIUM (PORCINE) 5000 UNIT/ML IJ SOLN
5000.0000 [IU] | Freq: Three times a day (TID) | INTRAMUSCULAR | Status: DC
  Administered 2024-04-14: 5000 [IU] via SUBCUTANEOUS
  Filled 2024-04-14: qty 1

## 2024-04-14 MED ORDER — LACTATED RINGERS IV BOLUS
500.0000 mL | Freq: Once | INTRAVENOUS | Status: AC
  Administered 2024-04-14: 500 mL via INTRAVENOUS

## 2024-04-14 MED ORDER — NOREPINEPHRINE 4 MG/250ML-% IV SOLN: 0.0000 ug/min | INTRAVENOUS | Status: DC

## 2024-04-14 MED ORDER — SODIUM CHLORIDE 0.9 % IV SOLN: 3.0000 g | Freq: Two times a day (BID) | INTRAVENOUS | Status: DC

## 2024-04-14 MED ORDER — HEPARIN BOLUS VIA INFUSION
3800.0000 [IU] | Freq: Once | INTRAVENOUS | Status: AC
  Administered 2024-04-14: 3800 [IU] via INTRAVENOUS
  Filled 2024-04-14: qty 3800

## 2024-04-14 MED ORDER — ACETAMINOPHEN 325 MG RE SUPP
975.0000 mg | Freq: Once | RECTAL | Status: AC
  Administered 2024-04-14: 975 mg via RECTAL
  Filled 2024-04-14: qty 3

## 2024-04-14 MED ORDER — HEPARIN (PORCINE) 25000 UT/250ML-% IV SOLN
750.0000 [IU]/h | INTRAVENOUS | Status: DC
  Administered 2024-04-14: 750 [IU]/h via INTRAVENOUS
  Filled 2024-04-14: qty 250

## 2024-04-14 MED ORDER — IPRATROPIUM-ALBUTEROL 20-100 MCG/ACT IN AERS
1.0000 | INHALATION_SPRAY | Freq: Four times a day (QID) | RESPIRATORY_TRACT | Status: DC
  Filled 2024-04-14 (×2): qty 4

## 2024-04-14 MED ORDER — METHYLPREDNISOLONE SODIUM SUCC 40 MG IJ SOLR: 40.0000 mg | INTRAMUSCULAR | Status: DC

## 2024-04-14 MED ORDER — LACTATED RINGERS IV SOLN: INTRAVENOUS | Status: DC

## 2024-04-14 MED ORDER — MORPHINE BOLUS VIA INFUSION
1.0000 mg | INTRAVENOUS | Status: DC | PRN
  Administered 2024-04-14 – 2024-04-15 (×3): 1 mg via INTRAVENOUS
  Administered 2024-04-15: 2 mg via INTRAVENOUS
  Administered 2024-04-15 (×2): 1 mg via INTRAVENOUS
  Administered 2024-04-15: 2 mg via INTRAVENOUS
  Administered 2024-04-15: 1 mg via INTRAVENOUS

## 2024-04-14 MED ORDER — DEXAMETHASONE SODIUM PHOSPHATE 10 MG/ML IJ SOLN
8.0000 mg | INTRAMUSCULAR | Status: DC
  Administered 2024-04-14: 8 mg via INTRAVENOUS
  Filled 2024-04-14: qty 1

## 2024-04-14 MED ORDER — SODIUM CHLORIDE 0.9 % IV BOLUS
250.0000 mL | Freq: Once | INTRAVENOUS | Status: AC
  Administered 2024-04-14: 250 mL via INTRAVENOUS

## 2024-04-14 MED ORDER — SODIUM CHLORIDE 0.9 % IV SOLN
3.0000 g | INTRAVENOUS | Status: DC
  Administered 2024-04-14: 3 g via INTRAVENOUS
  Filled 2024-04-14: qty 8

## 2024-04-14 MED ORDER — NOREPINEPHRINE 4 MG/250ML-% IV SOLN
INTRAVENOUS | Status: AC
  Administered 2024-04-14: 2 ug/min via INTRAVENOUS
  Filled 2024-04-14: qty 250

## 2024-04-14 MED ORDER — MORPHINE 100MG IN NS 100ML (1MG/ML) PREMIX INFUSION
1.0000 mg/h | INTRAVENOUS | Status: DC
  Administered 2024-04-14: 2 mg/h via INTRAVENOUS
  Administered 2024-04-15: 3 mg/h via INTRAVENOUS
  Filled 2024-04-14: qty 100

## 2024-04-14 MED ORDER — ACETAMINOPHEN 40 MG HALF SUPP: 650.0000 mg | Freq: Four times a day (QID) | RECTAL | Status: DC | PRN

## 2024-04-14 NOTE — Procedures (Signed)
  IR BRIEF PROGRESS NOTE:  IR was requested for NGT placement under Fluoroscopy, as multiple ED attempts were unsuccessful at placement. Patient is currently in ED awaiting bed placement, admitted for respiratory distress secondary to Covid pneumonia, on high-flow O2 by nasal cannula, and with altered mental status. Patient has a remote history of Nissen fundoplication.  Several attempt were made to pass a [place] Sump tube past the gastroesophageal junction. With every attempt, the tube would not advance, and began to coil within patient's esophagus, displacing it sharply. After several attempts and multiple manipulations, NGT placement was aborted. Care team advised. IR recommends GI consult for possible endoscopic approach.  Electronically Signed: Carlin DELENA Griffon, PA-C 04/14/2024, 12:47 PM

## 2024-04-14 NOTE — Progress Notes (Signed)
 Met with patient granddaughter (POA), daughter and nephew, decision is made tp transition to comfort care. Will place on morphine  drip. Anticipating death in 1-3 days.

## 2024-04-14 NOTE — Plan of Care (Signed)
                                                     Palliative Care Progress Note   Patient Name: Abigail Strong       Date: 04/14/2024 DOB: 09-Dec-1929  Age: 89 y.o. MRN#: [id number] Attending Physician: Laurita Pillion, MD Primary Care Physician: Fernand Fredy RAMAN, MD Admit Date: 04/13/2024  Extensive chart review completed including labs, vital signs, imaging, progress notes, orders, and available advanced directive documents from current and previous encounters.   Consult discussed with attending. Patient's family has transitioned patient to CMO. As per attending, palliative consult should be cancelled. No palliative needs at this time.   Goals are clear. PMT will sign off.  Please re-engage with PMT if palliative needs arise during hospitalization.   Thank you for allowing the Palliative Medicine Team to assist in the care of Abigail Strong.  Lamarr L. Arvid, DNP, FNP-BC Palliative Medicine Team  No charge

## 2024-04-14 NOTE — Progress Notes (Signed)
 PT Cancellation Note  Patient Details Name: TAYLR MEUTH MRN: [id number] DOB: 1930-05-16   Cancelled Treatment:    Reason Eval/Treat Not Completed: Patient not medically ready PT orders received, chart reviewed. Pt noted to have low BP, started on levophed , uptrending troponins, and increased O2 requirements. Will hold PT evaluation at this time & f/u once pt is medically stable.  Richerd Pinal, PT, DPT 04/14/24, 10:42 AM   Richerd CHRISTELLA Pinal 04/14/2024, 10:41 AM

## 2024-04-14 NOTE — Progress Notes (Signed)
 SLP Cancellation Note  Patient Details Name: Abigail Strong MRN: [id number] DOB: Aug 09, 1930   Cancelled treatment:       Reason Eval/Treat Not Completed:  (chart reviewed; pt has transitioned to Comfort Care status per chart notes. MD cancelled ST order. MD/NSG to reconsult ST services if new needs arise.)      Comer Portugal, MS, CCC-SLP Speech Language Pathologist Rehab Services; [facility] [phone] (ascom) Jasai Sorg 04/14/2024, 2:55 PM

## 2024-04-14 NOTE — ED Notes (Signed)
Dr. Mansy at bedside. 

## 2024-04-14 NOTE — TOC Initial Note (Signed)
 Transition of Care [facility]) - Initial/Assessment Note    Patient Details  Name: Abigail Strong MRN: [id number] Date of Birth: [birth year]  [facility] CM/SW Contact:    Abigail JAYSON Carpen, LCSW Phone Number: 04/14/2024, 11:31 AM  Clinical Narrative:  Per chart review, patient admitted from [facility]. CSW called admissions coordinator who confirmed she is a long-term resident. CSW will continue to follow patient for support and facilitate discharge once medically stable.                Expected Discharge Plan: Skilled Nursing Facility Barriers to Discharge: Continued Medical Work up   Patient Goals and CMS Choice            Expected Discharge Plan and Services       Living arrangements for the past 2 months: Skilled Nursing Facility                                      Prior Living Arrangements/Services Living arrangements for the past 2 months: Skilled Nursing Facility Lives with:: Facility Resident Patient language and need for interpreter reviewed:: Yes        Need for Family Participation in Patient Care: Yes (Comment) Care giver support system in place?: Yes (comment)   Criminal Activity/Legal Involvement Pertinent to Current Situation/Hospitalization: No - Comment as needed  Activities of Daily Living   ADL Screening (condition at time of admission) Independently performs ADLs?: No Does the patient have a NEW difficulty with bathing/dressing/toileting/self-feeding that is expected to last >3 days?: No Does the patient have a NEW difficulty with getting in/out of bed, walking, or climbing stairs that is expected to last >3 days?: No Does the patient have a NEW difficulty with communication that is expected to last >3 days?: No Is the patient deaf or have difficulty hearing?: Yes Does the patient have difficulty seeing, even when wearing glasses/contacts?: No Does the patient have difficulty concentrating, remembering, or making  decisions?: Yes  Permission Sought/Granted                  Emotional Assessment         Alcohol / Substance Use: Not Applicable Psych Involvement: No (comment)  Admission diagnosis:  Pneumonia [J18.9] Acute hypoxemic respiratory failure (HCC) [J96.01] Patient Active Problem List   Diagnosis Date Noted   COVID-19 virus infection 04/14/2024   Acute renal failure superimposed on stage 3a chronic kidney disease (HCC) 04/14/2024   Acute hypoxemic respiratory failure (HCC) 04/14/2024   Myocardial injury 04/14/2024   Septic shock (HCC) 04/14/2024   Pneumonia 04/13/2024   Generalized weakness 11/24/2023   Hyponatremia 11/24/2023   Acute on chronic diastolic CHF (congestive heart failure) (HCC) 11/24/2023   Dyslipidemia 11/24/2023   Hypothyroidism 11/24/2023   GERD without esophagitis 11/24/2023   Urinary tract infection 10/26/2023   Head injury 06/04/2023   SAH (subarachnoid hemorrhage) (HCC) 06/04/2023   SDH (subdural hematoma) (HCC) 06/04/2023   Seasonal allergic rhinitis due to pollen 03/08/2023   Benign positional vertigo, bilateral 03/08/2023   Nausea 03/08/2023   Chronic atrial fibrillation (HCC) 10/04/2022   CHF (congestive heart failure), NYHA class III, acute on chronic, diastolic (HCC) 09/21/2022   Acute metabolic encephalopathy 03/09/2021   Acute lower UTI 03/09/2021   Skin tear of lower leg without complication, left, initial encounter 01/19/2021   Memory loss 01/19/2021   Breast pain, left 11/17/2020   Right  shoulder pain 09/01/2020   Disorder of rotator cuff, left 09/01/2020   Cervical spine arthritis 07/21/2020   Neuralgia of chest 07/21/2020   Annual physical exam 05/17/2020   Need for influenza vaccination 05/17/2020   Osteoarthritis of both knees 04/26/2020   Dizzinesses 01/21/2020   Swelling of limb 01/12/2020   Bruit of right carotid artery 01/12/2020   Edema of lower leg due to peripheral venous insufficiency 12/28/2019   Hip pain 12/25/2018    SBO (small bowel obstruction) (HCC) 09/28/2018   Arthritis of knee 02/19/2017   COPD exacerbation (HCC) 09/27/2016   CAP (community acquired pneumonia) 09/27/2016   Influenza A 09/27/2016   Prediabetes 09/27/2016   Leukocytosis 09/27/2016   Chronic fatigue, unspecified 09/27/2016   Acute respiratory failure with hypoxia (HCC) 09/22/2016   Chronic bronchitis (HCC) 09/09/2016   Osteoarthritis of knee 03/23/2016   Diverticulitis 03/23/2015   Depression 01/01/2014   GERD (gastroesophageal reflux disease) 01/01/2014   Mixed hyperlipidemia 01/01/2014   Hypothyroidism, unspecified 01/01/2014   OA (osteoarthritis) 01/01/2014   Essential hypertension 12/08/2009   COUGH 12/08/2009   PCP:  Fernand Fredy RAMAN, MD Pharmacy:   [id number] - [place], [place] - [address] AT [facility] OF [address] [place] [address] [place] [address] Phone: [phone] Fax: [phone]     Social Drivers of Health (SDOH) Social History: SDOH Screenings   Food Insecurity: No Food Insecurity (04/14/2024)  Housing: Low Risk  (04/14/2024)  Transportation Needs: No Transportation Needs (04/14/2024)  Utilities: Not At Risk (04/14/2024)  Alcohol Screen: Low Risk  (05/04/2021)  Depression (PHQ2-9): Low Risk  (05/04/2021)  Financial Resource Strain: High Risk (05/04/2021)  Physical Activity: Inactive (05/04/2021)  Social Connections: Unknown (04/14/2024)  Stress: No Stress Concern Present (05/04/2021)  Tobacco Use: Low Risk  (04/13/2024)   SDOH Interventions:     Readmission Risk Interventions     No data to display

## 2024-04-14 NOTE — ED Notes (Signed)
 Talked with pt's daughter sharon and explained how her mother was doing and the plan of care.

## 2024-04-14 NOTE — ED Notes (Signed)
 Secure chatted Dr. Lawence to see what else he wants to do regarding the significant increase in troponin. Waiting on reply. Pt denies any Chest pain

## 2024-04-14 NOTE — ED Notes (Signed)
 Per Dr. Laurita, wean patient off Levo and give 500cc bolus.

## 2024-04-14 NOTE — Consult Note (Signed)
 Date of Consultation:  04/14/2024  Requesting Physician:  Murvin Mana, MD  Reason for Consultation:  Small bowel obstruction  History of Present Illness: Abigail Strong is a 89 y.o. female admitted with COVID-19, pneumonia and AKI.  She was also complaining of nausea / vomiting and CT scan showed high grade small bowel obstruction, with transition point in the mid abdomen.  Unfortunately NG tube was not able to be placed last night due to meeting resistance.  IR has been consulted for NG placement under fluoro today but they were unsuccessful in passing an NG tube due to resistance at the GE junction.  KUB today shows very distended stomach and also distended small bowel consistent with persistent SBO.  Overnight her blood pressure has been more difficult to manage and she need fluid bolus.  Her respiratory status has also worsened, and she's now requiring HFNC.  Dr. Mana discussed with the family yesterday and they would not be interested in surgical management and she is also DNR/DNI.  Past Medical History: Past Medical History:  Diagnosis Date   Depression    GERD (gastroesophageal reflux disease)    Hyperlipidemia    Hyperlipidemia, unspecified 01/01/2014   Hypothyroidism    Insomnia    Musculoskeletal chest pain 09/27/2016     Past Surgical History: Past Surgical History:  Procedure Laterality Date   ABDOMINAL HYSTERECTOMY     BREAST BIOPSY Bilateral 90's   benign   cataracts     COLON SURGERY     partial colectomy   intestinal blockage     NISSEN FUNDOPLICATION      Home Medications: Prior to Admission medications   Medication Sig Start Date End Date Taking? Authorizing Provider  acetaminophen  (TYLENOL ) 325 MG tablet Take 650 mg by mouth every 8 (eight) hours as needed for mild pain (pain score 1-3) or moderate pain (pain score 4-6).   Yes [provider]  Baclofen 5 MG TABS Take 1 tablet by mouth 2 (two) times daily as needed. 04/09/24  Yes [provider]  busPIRone (BUSPAR) 5 MG tablet Take 5 mg by mouth 2 (two) times daily. 03/06/24  Yes [provider]  calcium  carbonate (TUMS - DOSED IN MG ELEMENTAL CALCIUM ) 500 MG chewable tablet Chew 2 tablets by mouth 3 (three) times daily as needed for indigestion or heartburn.   Yes [provider]  Cholecalciferol  50 MCG (2000 UT) CAPS Take 2,000 Units by mouth daily.   Yes [provider]  citalopram  (CELEXA ) 20 MG tablet Take 1 tablet (20 mg total) by mouth daily. 10/28/23 04/13/24 Yes Trudy Anthony CHRISTELLA, MD  diclofenac Sodium (VOLTAREN) 1 % GEL Apply topically 2 (two) times daily.   Yes [provider]  ferrous sulfate  325 (65 FE) MG EC tablet Take 325 mg by mouth daily with breakfast.   Yes [provider]  fluticasone  (FLONASE ) 50 MCG/ACT nasal spray Place 1 spray into both nostrils daily. 03/08/23  Yes Fernand Fredy RAMAN, MD  guaiFENesin  (ROBITUSSIN) 100 MG/5ML liquid Take 10 mLs by mouth every 4 (four) hours as needed for cough or to loosen phlegm.   Yes [provider]  ipratropium-albuterol  (DUONEB) 0.5-2.5 (3) MG/3ML SOLN Inhale into the lungs 2 (two) times daily. 04/06/24  Yes [provider]  Iron -FA-B Cmp-C-Biot-Probiotic (FUSION PLUS) CAPS Take 1 capsule by mouth daily. 03/20/23  Yes Orlean Alan CHRISTELLA, FNP  levothyroxine  (LEVOXYL ) 112 MCG tablet Take 1 tablet (112 mcg total) by mouth daily before breakfast. 09/27/23  Yes  Fernand Fredy RAMAN, MD  pantoprazole  (PROTONIX ) 40 MG tablet TAKE 1 TABLET(40 MG) BY MOUTH DAILY 12/12/23  Yes Fernand Fredy RAMAN, MD  polyethylene glycol (MIRALAX  / GLYCOLAX ) 17 g packet Take 17 g by mouth every other day. 11/30/23  Yes Darci Pore, MD  ranolazine  (RANEXA ) 500 MG 12 hr tablet Take 1 tablet (500 mg total) by mouth 2 (two) times daily. 07/02/23  Yes Fernand Fredy RAMAN, MD  rosuvastatin  (CRESTOR ) 20 MG tablet Take 1 tablet (20 mg total) by mouth daily. 10/04/22  Yes Fernand Fredy RAMAN, MD  senna (SENOKOT) 8.6 MG TABS  tablet Take 2 tablets by mouth at bedtime.   Yes [provider]  SODIUM CHLORIDE  PO Take 1,000 mg by mouth in the morning, at noon, and at bedtime.   Yes [provider]  torsemide  (DEMADEX ) 10 MG tablet Take 1 tablet (10 mg total) by mouth daily as needed (Edema, fluid overload). 11/29/23  Yes Sreeram, Pore, MD  TRELEGY ELLIPTA 100-62.5-25 MCG/ACT AEPB Inhale 1 puff into the lungs daily. 03/16/24  Yes [provider]  benzonatate  (TESSALON ) 100 MG capsule Take 1 capsule (100 mg total) by mouth 3 (three) times daily as needed for cough. Patient not taking: Reported on 04/13/2024 11/29/23   Darci Pore, MD  ELIQUIS  2.5 MG TABS tablet Take 1 tablet (2.5 mg total) by mouth 2 (two) times daily. Patient not taking: Reported on 04/13/2024 05/28/23   Fernand Denyse LABOR, MD  fenofibrate  54 MG tablet TAKE 1 TABLET(54 MG) BY MOUTH DAILY Patient not taking: Reported on 04/13/2024 12/12/23   Fernand Fredy RAMAN, MD  hydrOXYzine  (ATARAX ) 25 MG tablet TAKE 1 TABLET(25 MG) BY MOUTH EVERY 6 HOURS AS NEEDED Patient not taking: Reported on 04/13/2024 09/30/23   Fernand Fredy RAMAN, MD  meclizine  (ANTIVERT ) 12.5 MG tablet Take 1 tablet (12.5 mg total) by mouth 2 (two) times daily as needed for dizziness. Patient not taking: Reported on 04/13/2024 11/29/23   Darci Pore, MD    Allergies: Allergies  Allergen Reactions   Gabapentin  Other (See Comments)    Dizziness    Sulfa Antibiotics Other (See Comments)   Sulfonamide Derivatives     Social History:  reports that she has never smoked. She has never used smokeless tobacco. She reports that she does not drink alcohol and does not use drugs.   Family History: Family History  Problem Relation Age of Onset   Cervical cancer Mother    Breast cancer Maternal Aunt     Review of Systems: Review of Systems  Unable to perform ROS: Mental acuity    Physical Exam BP (!) 84/69   Pulse 90   Temp 97.9 F (36.6 C) (Oral)   Resp  (!) 25   Ht 5' 3 (1.6 m)   Wt 63.5 kg   SpO2 95%   BMI 24.80 kg/m  CONSTITUTIONAL: No acute distress, but not talkative. HEENT:  Normocephalic, atraumatic, extraocular motion intact. RESPIRATORY:  Has high flow nasal canula, currently no respiratory distress CARDIOVASCULAR: Regular rhythm and rate. GI: The abdomen is soft but distended, with tympany.  Patient grimaces mildly, but does not appear peritonitic.  Has well healed midline scar. MUSCULOSKELETAL:  No peripheral edema or cyanosis. SKIN: Skin turgor is normal. There are no pathologic skin lesions.   Laboratory Analysis: Results for orders placed or performed during the hospital encounter of 04/13/24 (from the past 24 hours)  Lactic acid, plasma     Status: Abnormal   Collection Time: 04/13/24 12:34 PM  Result Value Ref Range   Lactic Acid, Venous 2.3 (HH) 0.5 - 1.9 mmol/L  Comprehensive metabolic panel     Status: Abnormal   Collection Time: 04/13/24 12:34 PM  Result Value Ref Range   Sodium 136 135 - 145 mmol/L   Potassium 5.0 3.5 - 5.1 mmol/L   Chloride 94 (L) 98 - 111 mmol/L   CO2 24 22 - 32 mmol/L   Glucose, Bld 129 (H) 70 - 99 mg/dL   BUN 59 (H) 8 - 23 mg/dL   Creatinine, Ser 7.07 (H) 0.44 - 1.00 mg/dL   Calcium  9.6 8.9 - 10.3 mg/dL   Total Protein 7.7 6.5 - 8.1 g/dL   Albumin 3.5 3.5 - 5.0 g/dL   AST 52 (H) 15 - 41 U/L   ALT 16 0 - 44 U/L   Alkaline Phosphatase 60 38 - 126 U/L   Total Bilirubin 0.7 0.0 - 1.2 mg/dL   GFR, Estimated 15 (L) >60 mL/min   Anion gap 18 (H) 5 - 15  Blood Culture (routine x 2)     Status: None (Preliminary result)   Collection Time: 04/13/24 12:34 PM   Specimen: BLOOD  Result Value Ref Range   Specimen Description BLOOD RIGHT FA    Special Requests      BOTTLES DRAWN AEROBIC AND ANAEROBIC Blood Culture results may not be optimal due to an inadequate volume of blood received in culture bottles   Culture      NO GROWTH < 24 HOURS Performed at [facility], [address] [place], [place] [address]    Report Status PENDING   Brain natriuretic peptide     Status: Abnormal   Collection Time: 04/13/24 12:34 PM  Result Value Ref Range   B Natriuretic Peptide 476.0 (H) 0.0 - 100.0 pg/mL  Troponin I (High Sensitivity)     Status: Abnormal   Collection Time: 04/13/24 12:34 PM  Result Value Ref Range   Troponin I (High Sensitivity) 175 (HH) <18 ng/L  Procalcitonin     Status: None   Collection Time: 04/13/24 12:34 PM  Result Value Ref Range   Procalcitonin 2.11 ng/mL  Resp panel by RT-PCR (RSV, Flu A&B, Covid) Anterior Nasal Swab     Status: Abnormal   Collection Time: 04/13/24 12:36 PM   Specimen: Anterior Nasal Swab  Result Value Ref Range   SARS Coronavirus 2 by RT PCR POSITIVE (A) NEGATIVE   Influenza A by PCR NEGATIVE NEGATIVE   Influenza B by PCR NEGATIVE NEGATIVE   Resp Syncytial Virus by PCR NEGATIVE NEGATIVE  Blood Culture (routine x 2)     Status: None (Preliminary result)   Collection Time: 04/13/24 12:36 PM   Specimen: BLOOD  Result Value Ref Range   Specimen Description BLOOD LEFT FA    Special Requests      BOTTLES DRAWN AEROBIC AND ANAEROBIC Blood Culture results may not be optimal due to an inadequate volume of blood received in culture bottles   Culture      NO GROWTH < 24 HOURS Performed at [facility], [address] [place], [place] [address]    Report Status PENDING   CBC with Differential/Platelet     Status: Abnormal   Collection Time: 04/13/24 12:36 PM  Result Value Ref Range   WBC 12.3 (H) 4.0 - 10.5 K/uL   RBC 5.21 (H) 3.87 - 5.11 MIL/uL   Hemoglobin 15.8 (H) 12.0 - 15.0 g/dL   HCT 53.0 (H) 63.9 -  46.0 %   MCV 90.0 80.0 - 100.0 fL   MCH 30.3 26.0 - 34.0 pg   MCHC 33.7 30.0 - 36.0 g/dL   RDW 86.6 88.4 - 84.4 %   Platelets 274 150 - 400 K/uL   nRBC 0.0 0.0 - 0.2 %   Neutrophils Relative % 71 %   Neutro Abs 8.9 (H) 1.7 - 7.7 K/uL   Lymphocytes Relative 18 %   Lymphs Abs 2.2 0.7 - 4.0 K/uL   Monocytes  Relative 10 %   Monocytes Absolute 1.2 (H) 0.1 - 1.0 K/uL   Eosinophils Relative 0 %   Eosinophils Absolute 0.0 0.0 - 0.5 K/uL   Basophils Relative 0 %   Basophils Absolute 0.0 0.0 - 0.1 K/uL   Immature Granulocytes 1 %   Abs Immature Granulocytes 0.06 0.00 - 0.07 K/uL  Protime-INR     Status: None   Collection Time: 04/13/24  1:05 PM  Result Value Ref Range   Prothrombin Time 14.5 11.4 - 15.2 seconds   INR 1.1 0.8 - 1.2  Lactic acid, plasma     Status: None   Collection Time: 04/13/24  2:38 PM  Result Value Ref Range   Lactic Acid, Venous 1.8 0.5 - 1.9 mmol/L  Troponin I (High Sensitivity)     Status: Abnormal   Collection Time: 04/13/24  2:38 PM  Result Value Ref Range   Troponin I (High Sensitivity) 185 (HH) <18 ng/L  Urinalysis, w/ Reflex to Culture (Infection Suspected) -Urine, Clean Catch     Status: Abnormal   Collection Time: 04/14/24 12:59 AM  Result Value Ref Range   Specimen Source URINE, RANDOM    Color, Urine AMBER (A) YELLOW   APPearance CLOUDY (A) CLEAR   Specific Gravity, Urine 1.021 1.005 - 1.030   pH 5.0 5.0 - 8.0   Glucose, UA NEGATIVE NEGATIVE mg/dL   Hgb urine dipstick NEGATIVE NEGATIVE   Bilirubin Urine NEGATIVE NEGATIVE   Ketones, ur NEGATIVE NEGATIVE mg/dL   Protein, ur 899 (A) NEGATIVE mg/dL   Nitrite NEGATIVE NEGATIVE   Leukocytes,Ua MODERATE (A) NEGATIVE   RBC / HPF 11-20 0 - 5 RBC/hpf   WBC, UA >50 0 - 5 WBC/hpf   Bacteria, UA MANY (A) NONE SEEN   Squamous Epithelial / HPF 0-5 0 - 5 /HPF   WBC Clumps PRESENT    Mucus PRESENT    Hyaline Casts, UA PRESENT   Strep pneumoniae urinary antigen     Status: None   Collection Time: 04/14/24  1:01 AM  Result Value Ref Range   Strep Pneumo Urinary Antigen NEGATIVE NEGATIVE  Basic metabolic panel     Status: Abnormal   Collection Time: 04/14/24  5:35 AM  Result Value Ref Range   Sodium 141 135 - 145 mmol/L   Potassium 4.0 3.5 - 5.1 mmol/L   Chloride 103 98 - 111 mmol/L   CO2 23 22 - 32 mmol/L    Glucose, Bld 87 70 - 99 mg/dL   BUN 73 (H) 8 - 23 mg/dL   Creatinine, Ser 6.50 (H) 0.44 - 1.00 mg/dL   Calcium  7.7 (L) 8.9 - 10.3 mg/dL   GFR, Estimated 12 (L) >60 mL/min   Anion gap 15 5 - 15  CBC     Status: None   Collection Time: 04/14/24  5:35 AM  Result Value Ref Range   WBC 8.3 4.0 - 10.5 K/uL   RBC 4.15 3.87 - 5.11 MIL/uL   Hemoglobin 12.6 12.0 -  15.0 g/dL   HCT 61.1 63.9 - 53.9 %   MCV 93.5 80.0 - 100.0 fL   MCH 30.4 26.0 - 34.0 pg   MCHC 32.5 30.0 - 36.0 g/dL   RDW 86.3 88.4 - 84.4 %   Platelets 205 150 - 400 K/uL   nRBC 0.0 0.0 - 0.2 %  Brain natriuretic peptide     Status: Abnormal   Collection Time: 04/14/24  5:35 AM  Result Value Ref Range   B Natriuretic Peptide 282.0 (H) 0.0 - 100.0 pg/mL  Troponin I (High Sensitivity)     Status: Abnormal   Collection Time: 04/14/24  5:35 AM  Result Value Ref Range   Troponin I (High Sensitivity) 245 (HH) <18 ng/L  Troponin I (High Sensitivity)     Status: Abnormal   Collection Time: 04/14/24  7:42 AM  Result Value Ref Range   Troponin I (High Sensitivity) 393 (HH) <18 ng/L  APTT     Status: None   Collection Time: 04/14/24  7:42 AM  Result Value Ref Range   aPTT 29 24 - 36 seconds  Protime-INR     Status: Abnormal   Collection Time: 04/14/24  7:42 AM  Result Value Ref Range   Prothrombin Time 15.5 (H) 11.4 - 15.2 seconds   INR 1.2 0.8 - 1.2  Lactate dehydrogenase     Status: Abnormal   Collection Time: 04/14/24  7:42 AM  Result Value Ref Range   LDH 198 (H) 98 - 192 U/L    Imaging: DG Abd 2 Views Result Date: 04/14/2024 EXAM: 2 VIEW XRAY OF THE ABDOMEN 04/14/2024 06:28:00 AM COMPARISON: None available. CLINICAL HISTORY: SBO (small bowel obstruction) (HCC) [id number]. Patient with SBO, unable to get NG tube down. Covid-19 positive. HX of GERD, Nissen fundoplication, partial colectomy, and hysterectomy. FINDINGS: BOWEL: Persistant marked gaseous distension of the stomach. Gaseous dilatation of several loops of small bowel  consistent with small bowel obstruction. Unchanged. SOFT TISSUES: Pelvic phleboliths. BONES: No acute osseous abnormality. IMPRESSION: 1. Small bowel obstruction with marked gaseous distension of the stomach and gaseous dilatation of several loops of small bowel. Electronically signed by: Waddell Calk MD 04/14/2024 07:25 AM EDT RP Workstation: [id number]   DG Chest Port 1 View Result Date: 04/14/2024 CLINICAL DATA:  Shortness of breath. EXAM: PORTABLE CHEST 1 VIEW COMPARISON:  April 13, 2024 FINDINGS: The heart size and mediastinal contours are within normal limits. Low lung volumes are noted with mild, diffuse, chronic appearing increased interstitial lung markings. Mild areas of scarring and/or atelectasis are suspected within the bilateral lung bases and mid right lung. No pleural effusion or pneumothorax is identified. There is marked severity gastric distension. This corresponds to finding seen on an earlier chest CT. Multilevel degenerative changes are noted throughout the thoracic spine. IMPRESSION: 1. Low lung volumes with mild bibasilar and mid right lung scarring and/or atelectasis. 2. Marked severity gastric distension. Electronically Signed   By: Suzen Dials M.D.   On: 04/14/2024 03:46   DG Abd Portable 1 View Result Date: 04/13/2024 CLINICAL DATA:  NG tube placement EXAM: PORTABLE ABDOMEN - 1 VIEW COMPARISON:  CT abdomen pelvis 04/13/2024 FINDINGS: Limited field of view for tube placement verification purposes. An enteric tube is present with tip projecting over the mid mediastinum consistent with location in the mid esophagus. Advancement is recommended if placement in the stomach is desired. Gaseous distention of stomach and bowel demonstrated in the upper abdomen. IMPRESSION: Enteric tube tip projects over the mid mediastinum suggesting likely  location in the esophagus. Advancement is recommended if placement in the stomach is desired. Electronically Signed   By: Elsie Gravely M.D.    On: 04/13/2024 21:07   CT CHEST WO CONTRAST Result Date: 04/13/2024 CLINICAL DATA:  Pneumonia, complication suspected, xray done; Abdominal pain, acute, nonlocalized. EXAM: CT CHEST, ABDOMEN AND PELVIS WITHOUT CONTRAST TECHNIQUE: Multidetector CT imaging of the chest, abdomen and pelvis was performed following the standard protocol without IV contrast. RADIATION DOSE REDUCTION: This exam was performed according to the departmental dose-optimization program which includes automated exposure control, adjustment of the mA and/or kV according to patient size and/or use of iterative reconstruction technique. COMPARISON:  CT venogram abdomen and pelvis from 04/25/2019 and CT scan chest from 03/05/2017. FINDINGS: CT CHEST FINDINGS Cardiovascular: Normal cardiac size. No pericardial effusion. No aortic aneurysm. There are coronary artery calcifications, in keeping with coronary artery disease. There are also mild-to-moderate peripheral atherosclerotic vascular calcifications of thoracic aorta and its major branches. Mediastinum/Nodes: Visualized thyroid  gland appears grossly unremarkable. No solid / cystic mediastinal masses. There is fluid-filled and dilated esophagus, which is nonspecific but most likely seen in the settings of chronic gastroesophageal reflux disease versus esophageal dysmotility. There are few mildly prominent mediastinal lymph nodes, which do not meet the size criteria for lymphadenopathy and appear grossly similar to the prior study, favoring benign etiology. No axillary lymphadenopathy by size criteria. Evaluation of bilateral hila is limited due to lack on intravenous contrast: however, no large hilar lymphadenopathy identified. Lungs/Pleura: The central tracheo-bronchial tree is patent. There is mild, smooth, circumferential thickening of the segmental and subsegmental bronchial walls, throughout bilateral lungs, which is nonspecific. Findings are most commonly seen with bronchitis or reactive  airway disease, such as asthma. There are small heterogeneous airspace opacities in the right upper lobe (series 4, image 37) and in the left lower lobe (series 4, image 80), which may represent focal pneumonia. There are patchy areas of linear, plate-like atelectasis and/or scarring throughout bilateral lungs. There are bilateral trace pleural effusions with associated atelectatic changes. No suspicious lung nodules. No lung mass or collapse. Musculoskeletal: The visualized soft tissues of the chest wall are grossly unremarkable. No suspicious osseous lesions. There are mild multilevel degenerative changes in the visualized spine. CT ABDOMEN PELVIS FINDINGS Hepatobiliary: The liver is normal in size. Non-cirrhotic configuration. No suspicious mass. No intrahepatic or extrahepatic bile duct dilation. No calcified gallstones. Normal gallbladder wall thickness. No pericholecystic inflammatory changes. Pancreas: Unremarkable. No pancreatic ductal dilatation or surrounding inflammatory changes. Spleen: Within normal limits. No focal lesion. Adrenals/Urinary Tract: Adrenal glands are unremarkable. Small/atrophic bilateral kidneys, right more than left. There are several simple cysts in the bilateral kidneys with largest in the left kidney interpolar region, posteriorly measuring up to 2.5 x 3.1 cm. No nephroureterolithiasis or obstructive uropathy on either side. Pick 2 Stomach/Bowel: There is disproportionate dilation (diameter up to 4.2 cm) of stomach, duodenum and proximal jejunal bowel loops. However, there is a transition point in the right paramedian mid abdomen (series 2, image 57 and series 5, image 38), distal to which the distal small bowel loops are nondilated. The colon is also nondilated. Findings are compatible with high-grade small bowel obstruction likely secondary to adhesion. There are no associated tumor. No perienteric fat stranding. No pneumoperitoneum, pneumatosis or portal venous gas. No walled-off  abscess or loculated collection. There are multiple colonic diverticula mainly in the sigmoid colon and proximal descending colon without diverticulitis. Stomach is markedly distended and filled with air and fluid. Vascular/Lymphatic: No  abdominal or pelvic lymphadenopathy, by size criteria. No aneurysmal dilation of the major abdominal arteries. There are moderate peripheral atherosclerotic vascular calcifications of the aorta and its major branches. Reproductive: The uterus is surgically absent. No large adnexal mass. Other: There is a small fat containing periumbilical hernia. There are also tiny fat containing bilateral inguinal hernias. The soft tissues and abdominal wall are otherwise unremarkable. Musculoskeletal: No suspicious osseous lesions. There are moderate multilevel degenerative changes in the visualized spine. IMPRESSION: 1. There are small heterogeneous airspace opacities in the right upper lobe and left lower lobe, which may represent focal pneumonia. 2. There is high-grade small bowel obstruction with transition point in the right paramedian mid abdomen, as described above, most likely secondary to underlying adhesion. No associated mass or enteritis. No pneumoperitoneum, pneumatosis or portal venous gas. No walled-off abscess or loculated collection. 3. Multiple other nonacute observations, as described above. Aortic Atherosclerosis (ICD10-I70.0). Electronically Signed   By: Ree Molt M.D.   On: 04/13/2024 17:56   CT ABDOMEN PELVIS WO CONTRAST Result Date: 04/13/2024 CLINICAL DATA:  Pneumonia, complication suspected, xray done; Abdominal pain, acute, nonlocalized. EXAM: CT CHEST, ABDOMEN AND PELVIS WITHOUT CONTRAST TECHNIQUE: Multidetector CT imaging of the chest, abdomen and pelvis was performed following the standard protocol without IV contrast. RADIATION DOSE REDUCTION: This exam was performed according to the departmental dose-optimization program which includes automated exposure  control, adjustment of the mA and/or kV according to patient size and/or use of iterative reconstruction technique. COMPARISON:  CT venogram abdomen and pelvis from 04/25/2019 and CT scan chest from 03/05/2017. FINDINGS: CT CHEST FINDINGS Cardiovascular: Normal cardiac size. No pericardial effusion. No aortic aneurysm. There are coronary artery calcifications, in keeping with coronary artery disease. There are also mild-to-moderate peripheral atherosclerotic vascular calcifications of thoracic aorta and its major branches. Mediastinum/Nodes: Visualized thyroid  gland appears grossly unremarkable. No solid / cystic mediastinal masses. There is fluid-filled and dilated esophagus, which is nonspecific but most likely seen in the settings of chronic gastroesophageal reflux disease versus esophageal dysmotility. There are few mildly prominent mediastinal lymph nodes, which do not meet the size criteria for lymphadenopathy and appear grossly similar to the prior study, favoring benign etiology. No axillary lymphadenopathy by size criteria. Evaluation of bilateral hila is limited due to lack on intravenous contrast: however, no large hilar lymphadenopathy identified. Lungs/Pleura: The central tracheo-bronchial tree is patent. There is mild, smooth, circumferential thickening of the segmental and subsegmental bronchial walls, throughout bilateral lungs, which is nonspecific. Findings are most commonly seen with bronchitis or reactive airway disease, such as asthma. There are small heterogeneous airspace opacities in the right upper lobe (series 4, image 37) and in the left lower lobe (series 4, image 80), which may represent focal pneumonia. There are patchy areas of linear, plate-like atelectasis and/or scarring throughout bilateral lungs. There are bilateral trace pleural effusions with associated atelectatic changes. No suspicious lung nodules. No lung mass or collapse. Musculoskeletal: The visualized soft tissues of the  chest wall are grossly unremarkable. No suspicious osseous lesions. There are mild multilevel degenerative changes in the visualized spine. CT ABDOMEN PELVIS FINDINGS Hepatobiliary: The liver is normal in size. Non-cirrhotic configuration. No suspicious mass. No intrahepatic or extrahepatic bile duct dilation. No calcified gallstones. Normal gallbladder wall thickness. No pericholecystic inflammatory changes. Pancreas: Unremarkable. No pancreatic ductal dilatation or surrounding inflammatory changes. Spleen: Within normal limits. No focal lesion. Adrenals/Urinary Tract: Adrenal glands are unremarkable. Small/atrophic bilateral kidneys, right more than left. There are several simple cysts in the bilateral  kidneys with largest in the left kidney interpolar region, posteriorly measuring up to 2.5 x 3.1 cm. No nephroureterolithiasis or obstructive uropathy on either side. Pick 2 Stomach/Bowel: There is disproportionate dilation (diameter up to 4.2 cm) of stomach, duodenum and proximal jejunal bowel loops. However, there is a transition point in the right paramedian mid abdomen (series 2, image 57 and series 5, image 38), distal to which the distal small bowel loops are nondilated. The colon is also nondilated. Findings are compatible with high-grade small bowel obstruction likely secondary to adhesion. There are no associated tumor. No perienteric fat stranding. No pneumoperitoneum, pneumatosis or portal venous gas. No walled-off abscess or loculated collection. There are multiple colonic diverticula mainly in the sigmoid colon and proximal descending colon without diverticulitis. Stomach is markedly distended and filled with air and fluid. Vascular/Lymphatic: No abdominal or pelvic lymphadenopathy, by size criteria. No aneurysmal dilation of the major abdominal arteries. There are moderate peripheral atherosclerotic vascular calcifications of the aorta and its major branches. Reproductive: The uterus is surgically  absent. No large adnexal mass. Other: There is a small fat containing periumbilical hernia. There are also tiny fat containing bilateral inguinal hernias. The soft tissues and abdominal wall are otherwise unremarkable. Musculoskeletal: No suspicious osseous lesions. There are moderate multilevel degenerative changes in the visualized spine. IMPRESSION: 1. There are small heterogeneous airspace opacities in the right upper lobe and left lower lobe, which may represent focal pneumonia. 2. There is high-grade small bowel obstruction with transition point in the right paramedian mid abdomen, as described above, most likely secondary to underlying adhesion. No associated mass or enteritis. No pneumoperitoneum, pneumatosis or portal venous gas. No walled-off abscess or loculated collection. 3. Multiple other nonacute observations, as described above. Aortic Atherosclerosis (ICD10-I70.0). Electronically Signed   By: Ree Molt M.D.   On: 04/13/2024 17:56   DG Chest Port 1 View Result Date: 04/13/2024 CLINICAL DATA:  Questionable sepsis-evaluate for abnormality. Patient unable to hold still for imaging. Multiple attempts were made. Best obtainable images. EXAM: PORTABLE CHEST 1 VIEW COMPARISON:  11/28/2023 FINDINGS: Low lung volumes and patient positioning limits assessment. Grossly stable cardiomediastinal silhouette. Low lung volumes accentuate pulmonary vascularity. Left lower lung atelectasis or infiltrates. Evidence of pleural effusion or pneumothorax. Marked gaseous distention of the stomach. IMPRESSION: 1. Marked gaseous distention of the stomach. 2. Left lower lung atelectasis or infiltrates. Electronically Signed   By: Norman Gatlin M.D.   On: 04/13/2024 13:29    Assessment and Plan: This is a 89 y.o. female with pneumonia, COVID, AKI, and SBO.  --Discussed with the family the findings on her CT scan showing a small bowel obstruction with a very clear transition point.  She has had prior abdominal  surgeries and prior SBO that required lysis of adhesions.  Discussed that most likely the reason for the SBO is adhesions as well.  Typical management includes NG tube, NPO diet, and if needed, laparotomy.  However, last night's RN was unable to pass NG tube despite multiple attempts, and IR was also not able to pass NG tube under fluoro.  Her stomach is quite distended on KUB which is likely contributing to some of her respiratory issues as the stomach is likely pushing on her lungs.  Discussed with her that the next aggressive form would be with endoscopy with GI, but that would require general anesthesia and intubation.  Unfortunately, this would increase her risk of complications particularly with the pneumonia/COVID.   --The family would not want to pursue  such an aggressive treatment.  They expressed their wishes to not proceed with any surgery, endoscopy, or general anesthesia/intubation.  Their main goal is to prevent or treat any pain.  They would be ok with other attempts for NG placement at bedside, but otherwise would not want other aggressive measures.   --They are considering and leaning towards transitioning to comfort measures and are waiting for the patient's sister to arrive from [place]  tomorrow. --General surgery team will sign off for now but we are available if any questions or concerns.  We are available to help manage NG tube if one is able to get placed.  I spent 60 minutes dedicated to the care of this patient on the date of this encounter to include pre-visit review of records, face-to-face time with the patient discussing diagnosis and management, and any post-visit coordination of care.   Aloysius Sheree Plant, MD [facility] Pg:  [phone]

## 2024-04-14 NOTE — Hospital Course (Signed)
 Abigail Strong is a 89 y.o. female with medical history significant of PAF on Eliquis , COPD, chronic HFpEF, hypothyroidism, HTN, anxiety/depression, GERD, sent from nursing home for evaluation of new onset of dyspnea and altered mentations.  Patient recently diagnosed with COVID on last Wednesday.  He also had 2 days of nausea vomiting. Upon arrival, chest x-ray showed bilateral lower lobe infiltrates, severe hypoxia, was placed on 40% heated high flow.  Also had acute on chronic renal failure. She subsequently developed hypotension, was placed on Levophed . She is also placed on broad-spectrum antibiotics for pneumonia.

## 2024-04-14 NOTE — Progress Notes (Signed)
 Progress Note   Patient: Abigail Strong FMW:[id number] DOB: 1930-04-19 DOA: 04/13/2024     1 DOS: the patient was seen and examined on 04/14/2024   Brief hospital course: JESSACA PHILIPPI is a 89 y.o. female with medical history significant of PAF on Eliquis , COPD, chronic HFpEF, hypothyroidism, HTN, anxiety/depression, GERD, sent from nursing home for evaluation of new onset of dyspnea and altered mentations.  Patient recently diagnosed with COVID on last Wednesday.  He also had 2 days of nausea vomiting. Upon arrival, chest x-ray showed bilateral lower lobe infiltrates, severe hypoxia, was placed on 40% heated high flow.  Also had acute on chronic renal failure. She subsequently developed hypotension, was placed on Levophed . She is also placed on broad-spectrum antibiotics for pneumonia.   Principal Problem:   Pneumonia Active Problems:   Essential hypertension   Acute respiratory failure with hypoxia (HCC)   CAP (community acquired pneumonia)   SBO (small bowel obstruction) (HCC)   Acute metabolic encephalopathy   Chronic atrial fibrillation (HCC)   GERD without esophagitis   COVID-19 virus infection   Acute renal failure superimposed on stage 3a chronic kidney disease (HCC)   Acute hypoxemic respiratory failure (HCC)   Myocardial injury   Assessment and Plan: COVID infection. Acute respiratory failure secondary to aspiration pneumonia. Aspiration pneumonia of bilateral lower lobe secondary to nausea vomiting. Septic shock secondary to aspiration pneumonia. Patient had recent COVID infection, subsequently developed bilateral lower lobe pneumonia.  Given recent nausea vomiting, this is most likely aspiration pneumonia.  I personally reviewed the patient's CT, consistent with pneumonia. Patient currently on 40% of heated high flow, also requiring Levophed  for septic shock.  Patient had a temperature 101, heart rate 112, respiratory 34, leukocytosis, elevated procalcitonin  level, worsening renal function, hypotension. Discussed with ICU, patient will be sent to ICU stepdown unit, will try to wean off Levophed  after fluid bolus.  Continue IV fluids. Patient currently on Rocephin  and doxycycline , I will change it to Unasyn  for aspiration pneumonia.  Small bowel obstruction. CT abdomen pelvis showed high-grade small bowel obstruction in the right mid abdomen Transition point.  This could be due to adhesions from prior abdominal surgery. General surgery consult obtained, has difficult to place NG tube for suction.  IR to place NG tube today.  Acute renal failure superimposed on chronic kidney disease stage IIIa secondary to septic shock. Continue IV fluids. Monitor renal function closely.  Acute metabolic encephalopathy secondary to COVID and septic shock. Patient is very confused today, talk to patient and granddaughter, patient does not have chronic dementia, normally she does not have confusion.  Will follow closely.  Myocardial injury.  Secondary to demand ischemia from septic shock. Patient had a peak troponin of 393, given septic shock, this most likely is the source.  Will continue to follow closely.  Chronic atrial fibrillation. Based on record, patient was not taking anticoagulation chronically.  Will not restart at this time.  Continue monitor heart rate.  Prognosis. Given patient's multiorgan failure, with severe hypoxemia and septic shock, short-term prognosis is guarded, very high risk of mortality.  Patient has DNR status, will not resuscitate.  Granddaughter aware of patient prognosis.    Subjective:  Patient is very sleepy today, confused. She did not feel short of breath.  Physical Exam: Vitals:   04/14/24 0715 04/14/24 0730 04/14/24 0745 04/14/24 0800  BP: 105/70 109/78 94/66 102/72  Pulse: 89 95 96 94  Resp: (!) 37 (!) 29 (!) 29 (!) 29  Temp:  TempSrc:      SpO2: 99% 99% 96% 96%  Weight:      Height:       General exam:  Appears calm and comfortable  Respiratory system: Decreased breath sounds with tachypnea Cardiovascular system: Irregular. No JVD, murmurs, rubs, gallops or clicks. No pedal edema. Gastrointestinal system: Abdomen distended, soft and mildly diffuse tender. No organomegaly or masses felt.  Central nervous system: Drowsy and oriented x2. No focal neurological deficits. Extremities: Symmetric 5 x 5 power. Skin: No rashes, lesions or ulcers Psychiatry: Flat affect   Data Reviewed:  Reviewed CT scan results, lab results.  Family Communication: Granddaughter updated over the phone.  Disposition: Status is: Inpatient CRITICAL CARE Performed by: Murvin Mana   Total critical care time: 55 minutes  Critical care time was exclusive of separately billable procedures and treating other patients.  Critical care was necessary to treat or prevent imminent or life-threatening deterioration.  Critical care was time spent personally by me on the following activities: development of treatment plan with patient and/or surrogate as well as nursing, discussions with consultants, evaluation of patient's response to treatment, examination of patient, obtaining history from patient or surrogate, ordering and performing treatments and interventions, ordering and review of laboratory studies, ordering and review of radiographic studies, pulse oximetry and re-evaluation of patient's condition.      Author: Murvin Mana, MD 04/14/2024 8:41 AM  For on call review [email or web address].

## 2024-04-14 NOTE — ED Notes (Signed)
 Family at bedside with duck-bill N95 and gowns. MD at bedside.

## 2024-04-14 NOTE — ED Notes (Signed)
 Bladder Scan: 15ml. In and Out cathed pt and 20ml came out.

## 2024-04-14 NOTE — ED Notes (Signed)
 Patient in radiology for NG tube placement.

## 2024-04-14 NOTE — ED Notes (Signed)
 Patient has not had any urinary output; depends dry, purewick is in place.

## 2024-04-14 NOTE — Progress Notes (Signed)
 Brief Crtical Care note:  Abigail Strong is a 89 year old female patient with past medical history of PAF on Eliquis , COPD, hypertension presenting to [facility] with altered mental status weakness.  She was found to have COVID-19 pneumonia.  Started on high flow nasal cannula.  She was also found to be hypotensive for which critical care medicine was consulted.  On exam patient was lethargic and work of breathing slightly increased.  Labs reviewed with normal lactic acid at 1.8.  White count at 8.3.  AKI with creatinine of 3.49 mg/dL from a baseline of 8.84 mg/dL.  She appears to be fluid responsive and did improve with fluid.  She did not require any pressors.  I had a discussion with the family members at bedside and their goals is to be as minimally invasive as possible.  And should she show any signs of suffering then they would like to switch goals to comfort measures only.  She does not have any indication for ICU admission at the moment.  I would recommend involving palliative care to help gauge goals of care discussions.  Darrin Barn, MD [facility] 04/14/2024 11:34 AM

## 2024-04-14 NOTE — ED Notes (Signed)
 Asked Dr. Lawence for Levophed  for pt's BP's. Dr. Lawence put in order for of NS. Asked him to put in order for rectal tylenol  due to fever

## 2024-04-14 NOTE — Consult Note (Addendum)
 PHARMACY - ANTICOAGULATION CONSULT NOTE  Pharmacy Consult for Heparin  Indication: chest pain/ACS  Allergies  Allergen Reactions   Gabapentin  Other (See Comments)    Dizziness    Sulfa Antibiotics Other (See Comments)   Sulfonamide Derivatives     Patient Measurements: Height: 5' 3 (160 cm) Weight: 63.5 kg (140 lb) IBW/kg (Calculated) : 52.4 HEPARIN  DW (KG): 63.5  Vital Signs: Temp: 101.1 F (38.4 C) (08/26 0529) Temp Source: Rectal (08/26 0529) BP: 102/74 (08/26 0630) Pulse Rate: 100 (08/26 0630)  Labs: Recent Labs    04/13/24 1234 04/13/24 1236 04/13/24 1305 04/13/24 1438 04/14/24 0535  HGB  --  15.8*  --   --  12.6  HCT  --  46.9*  --   --  38.8  PLT  --  274  --   --  205  LABPROT  --   --  14.5  --   --   INR  --   --  1.1  --   --   CREATININE 2.92*  --   --   --  3.49*  TROPONINIHS 175*  --   --  185* 245*    Estimated Creatinine Clearance: 9.4 mL/min (A) (by C-G formula based on SCr of 3.49 mg/dL (H)).   Medical History: Past Medical History:  Diagnosis Date   Depression    GERD (gastroesophageal reflux disease)    Hyperlipidemia    Hyperlipidemia, unspecified 01/01/2014   Hypothyroidism    Insomnia    Musculoskeletal chest pain 09/27/2016    Medications:  No history of chronic anticoagulation PTA  Assessment: 89 y.o. female with medical history significant of PAF, COPD, chronic HFpEF, hypothyroidism, HTN, anxiety/depression, GERD, sent from nursing home for evaluation of new onset of dyspnea and altered mentations. Per patient, was on apixaban  in the past, but says she no longer takes this. Troponin levels of 185>245. Pharmacy has been consulted to initiate and monitor heparin  levels.  Baseline labs have been ordered and are pending   Goal of Therapy:  Heparin  level 0.3-0.7 units/ml Monitor platelets by anticoagulation protocol: Yes   Plan:  Give 3800 units bolus x 1 Start heparin  infusion at 750 units/hr Check anti-Xa level in 8 hours  and daily while on heparin  Continue to monitor H&H and platelets  Faven Watterson A Shaneen Reeser 04/14/2024,7:10 AM

## 2024-04-15 DIAGNOSIS — G9341 Metabolic encephalopathy: Secondary | ICD-10-CM | POA: Diagnosis not present

## 2024-04-15 DIAGNOSIS — J189 Pneumonia, unspecified organism: Secondary | ICD-10-CM | POA: Diagnosis not present

## 2024-04-15 DIAGNOSIS — U071 COVID-19: Secondary | ICD-10-CM | POA: Diagnosis not present

## 2024-04-15 DIAGNOSIS — Z515 Encounter for palliative care: Secondary | ICD-10-CM

## 2024-04-15 DIAGNOSIS — A419 Sepsis, unspecified organism: Secondary | ICD-10-CM | POA: Diagnosis not present

## 2024-04-15 LAB — URINE CULTURE: Culture: NO GROWTH

## 2024-04-15 LAB — LEGIONELLA PNEUMOPHILA SEROGP 1 UR AG: L. pneumophila Serogp 1 Ur Ag: NEGATIVE

## 2024-04-15 MED ORDER — LORAZEPAM 2 MG/ML IJ SOLN
1.0000 mg | Freq: Once | INTRAMUSCULAR | Status: AC
  Administered 2024-04-15: 1 mg via INTRAVENOUS
  Filled 2024-04-15: qty 0.5

## 2024-04-15 NOTE — Progress Notes (Signed)
 [facility] Room 105A [facility] Liaison Note  Received request from Transitions of Care Management regarding family interest in [facility].  Eligibility confirmed.  Spoke with patients granddaughter, Lela, to confirm interest and explain services.  Family is agreeable to transfer today.  Transitions of Care Manager made aware.  RN please call report to [phone] prior to patient leaving the unit.  Please send signed and completed DNR with patient at discharge.    Thank you, Daphne Shed, LPN [facility] Liaison [phone]

## 2024-04-15 NOTE — Discharge Summary (Signed)
 Physician Discharge Summary   Patient: HENRY DEMERITT MRN: [id number] DOB: 07/17/1930  Admit date:     04/13/2024  Discharge date: 04/15/24  Discharge Physician: Cresencio Fairly   PCP: Fernand Fredy RAMAN, MD   Recommendations at discharge:   Hospice Home  Discharge Diagnoses: Principal Problem:   Pneumonia Active Problems:   Essential hypertension   Acute respiratory failure with hypoxia (HCC)   CAP (community acquired pneumonia)   SBO (small bowel obstruction) (HCC)   Acute metabolic encephalopathy   Chronic atrial fibrillation (HCC)   GERD without esophagitis   COVID-19 virus infection   Acute renal failure superimposed on stage 3a chronic kidney disease (HCC)   Acute hypoxemic respiratory failure (HCC)   Myocardial injury   Septic shock [facility])  Hospital Course: NORISSA BARTEE is a 89 y.o. female with medical history significant of PAF on Eliquis , COPD, chronic HFpEF, hypothyroidism, HTN, anxiety/depression, GERD, sent from nursing home for evaluation of new onset of dyspnea and altered mentations.  Patient recently diagnosed with COVID on last Wednesday.  He also had 2 days of nausea vomiting. Upon arrival, chest x-ray showed bilateral lower lobe infiltrates, severe hypoxia, was placed on 40% heated high flow.  Also had acute on chronic renal failure. She subsequently developed hypotension, was placed on Levophed . She is also placed on broad-spectrum antibiotics for pneumonia.  Assessment and Plan:  89 y.o. female with medical history significant of PAF on Eliquis , COPD, chronic HFpEF, hypothyroidism, HTN, anxiety/depression, GERD, sent from nursing home for evaluation of new onset of dyspnea and altered mentations.  Patient recently diagnosed with COVID on last Wednesday.  He also had 2 days of nausea vomiting. Upon arrival, chest x-ray showed bilateral lower lobe infiltrates, severe hypoxia, was placed on 40% heated high flow.  Also had acute on chronic renal failure. She  subsequently developed hypotension, was placed on Levophed . She is also placed on broad-spectrum antibiotics for pneumonia.   COVID infection. Acute respiratory failure secondary to aspiration pneumonia. Aspiration pneumonia of bilateral lower lobe secondary to nausea vomiting. Septic shock secondary to aspiration pneumonia. Small bowel obstruction. Acute renal failure superimposed on chronic kidney disease stage IIIa secondary to septic shock. Acute metabolic encephalopathy secondary to COVID and septic shock. Myocardial injury.  Secondary to demand ischemia from septic shock. Chronic atrial fibrillation.   Prognosis. Given patient's multiorgan failure, with severe hypoxemia and septic shock, short-term prognosis is guarded, very high risk of mortality.  Patient has DNR status,  Patient is actively dying         Consultants: General surgery  Disposition: Hospice care Diet recommendation:  Discharge Diet Orders (From admission, onward)     Start     Ordered   04/15/24 0000  Diet - low sodium heart healthy        04/15/24 1136           Pleasure feed DISCHARGE MEDICATION: Allergies as of 04/15/2024       Reactions   Gabapentin  Other (See Comments)   Dizziness    Sulfa Antibiotics Other (See Comments)   Sulfonamide Derivatives         Medication List     STOP taking these medications    acetaminophen  325 MG tablet Commonly known as: TYLENOL    Baclofen 5 MG Tabs   benzonatate  100 MG capsule Commonly known as: TESSALON    busPIRone 5 MG tablet Commonly known as: BUSPAR   calcium  carbonate 500 MG chewable tablet Commonly known as: TUMS - dosed in mg  elemental calcium    Cholecalciferol  50 MCG (2000 UT) Caps   citalopram  20 MG tablet Commonly known as: CELEXA    diclofenac Sodium 1 % Gel Commonly known as: VOLTAREN   Eliquis  2.5 MG Tabs tablet Generic drug: apixaban    fenofibrate  54 MG tablet   ferrous sulfate  325 (65 FE) MG EC tablet    fluticasone  50 MCG/ACT nasal spray Commonly known as: FLONASE    Fusion Plus Caps   guaiFENesin  100 MG/5ML liquid Commonly known as: ROBITUSSIN   hydrOXYzine  25 MG tablet Commonly known as: ATARAX    ipratropium-albuterol  0.5-2.5 (3) MG/3ML Soln Commonly known as: DUONEB   levothyroxine  112 MCG tablet Commonly known as: Levoxyl    meclizine  12.5 MG tablet Commonly known as: ANTIVERT    pantoprazole  40 MG tablet Commonly known as: PROTONIX    polyethylene glycol 17 g packet Commonly known as: MIRALAX  / GLYCOLAX    ranolazine  500 MG 12 hr tablet Commonly known as: RANEXA    rosuvastatin  20 MG tablet Commonly known as: CRESTOR    senna 8.6 MG Tabs tablet Commonly known as: SENOKOT   SODIUM CHLORIDE  PO   torsemide  10 MG tablet Commonly known as: DEMADEX    Trelegy Ellipta 100-62.5-25 MCG/ACT Aepb Generic drug: Fluticasone -Umeclidin-Vilant        Discharge Exam: Filed Weights   04/13/24 1225  Weight: 63.5 kg   General exam: Appears to be actively dying Respiratory system: Cheyne-Stokes respiration Cardiovascular system: Irregular.  No murmurs Gastrointestinal system: Abdomen distended, soft and mildly diffuse tender. No organomegaly or masses felt.  Central nervous system: Obtunded Skin: No rashes, lesions or ulcers   Condition at discharge: poor  The results of significant diagnostics from this hospitalization (including imaging, microbiology, ancillary and laboratory) are listed below for reference.   Imaging Studies: DG Naso G Tube Plc W/Fl-No Rad Result Date: 04/14/2024 CLINICAL DATA:  89 year old female currently diagnosed with COVID, altered mental status, with concerns for SBO with severe gastric distention. Remote history of Nissen fundoplication. EXAM: DG NASO G TUBE PLC W/FL-NO RAD CONTRAST:  None. FLUOROSCOPY: Radiation Exposure Index (if provided by the fluoroscopic device): 11.40 COMPARISON:  None Available. FINDINGS: Unsuccessful  attempt at NG tube  placement. IMPRESSION: Unsuccessful attempt at NG tube placement. Procedure performed by Carlin Griffon, PA-C Electronically Signed   By: Marcey Moan M.D.   On: 04/14/2024 13:16   DG Abd 2 Views Result Date: 04/14/2024 EXAM: 2 VIEW XRAY OF THE ABDOMEN 04/14/2024 06:28:00 AM COMPARISON: None available. CLINICAL HISTORY: SBO (small bowel obstruction) (HCC) [id number]. Patient with SBO, unable to get NG tube down. Covid-19 positive. HX of GERD, Nissen fundoplication, partial colectomy, and hysterectomy. FINDINGS: BOWEL: Persistant marked gaseous distension of the stomach. Gaseous dilatation of several loops of small bowel consistent with small bowel obstruction. Unchanged. SOFT TISSUES: Pelvic phleboliths. BONES: No acute osseous abnormality. IMPRESSION: 1. Small bowel obstruction with marked gaseous distension of the stomach and gaseous dilatation of several loops of small bowel. Electronically signed by: Waddell Calk MD 04/14/2024 07:25 AM EDT RP Workstation: [id number]   DG Chest Port 1 View Result Date: 04/14/2024 CLINICAL DATA:  Shortness of breath. EXAM: PORTABLE CHEST 1 VIEW COMPARISON:  April 13, 2024 FINDINGS: The heart size and mediastinal contours are within normal limits. Low lung volumes are noted with mild, diffuse, chronic appearing increased interstitial lung markings. Mild areas of scarring and/or atelectasis are suspected within the bilateral lung bases and mid right lung. No pleural effusion or pneumothorax is identified. There is marked severity gastric distension. This corresponds to finding seen on an  earlier chest CT. Multilevel degenerative changes are noted throughout the thoracic spine. IMPRESSION: 1. Low lung volumes with mild bibasilar and mid right lung scarring and/or atelectasis. 2. Marked severity gastric distension. Electronically Signed   By: Suzen Dials M.D.   On: 04/14/2024 03:46   DG Abd Portable 1 View Result Date: 04/13/2024 CLINICAL DATA:  NG tube placement EXAM:  PORTABLE ABDOMEN - 1 VIEW COMPARISON:  CT abdomen pelvis 04/13/2024 FINDINGS: Limited field of view for tube placement verification purposes. An enteric tube is present with tip projecting over the mid mediastinum consistent with location in the mid esophagus. Advancement is recommended if placement in the stomach is desired. Gaseous distention of stomach and bowel demonstrated in the upper abdomen. IMPRESSION: Enteric tube tip projects over the mid mediastinum suggesting likely location in the esophagus. Advancement is recommended if placement in the stomach is desired. Electronically Signed   By: Elsie Gravely M.D.   On: 04/13/2024 21:07   CT CHEST WO CONTRAST Result Date: 04/13/2024 CLINICAL DATA:  Pneumonia, complication suspected, xray done; Abdominal pain, acute, nonlocalized. EXAM: CT CHEST, ABDOMEN AND PELVIS WITHOUT CONTRAST TECHNIQUE: Multidetector CT imaging of the chest, abdomen and pelvis was performed following the standard protocol without IV contrast. RADIATION DOSE REDUCTION: This exam was performed according to the departmental dose-optimization program which includes automated exposure control, adjustment of the mA and/or kV according to patient size and/or use of iterative reconstruction technique. COMPARISON:  CT venogram abdomen and pelvis from 04/25/2019 and CT scan chest from 03/05/2017. FINDINGS: CT CHEST FINDINGS Cardiovascular: Normal cardiac size. No pericardial effusion. No aortic aneurysm. There are coronary artery calcifications, in keeping with coronary artery disease. There are also mild-to-moderate peripheral atherosclerotic vascular calcifications of thoracic aorta and its major branches. Mediastinum/Nodes: Visualized thyroid  gland appears grossly unremarkable. No solid / cystic mediastinal masses. There is fluid-filled and dilated esophagus, which is nonspecific but most likely seen in the settings of chronic gastroesophageal reflux disease versus esophageal dysmotility.  There are few mildly prominent mediastinal lymph nodes, which do not meet the size criteria for lymphadenopathy and appear grossly similar to the prior study, favoring benign etiology. No axillary lymphadenopathy by size criteria. Evaluation of bilateral hila is limited due to lack on intravenous contrast: however, no large hilar lymphadenopathy identified. Lungs/Pleura: The central tracheo-bronchial tree is patent. There is mild, smooth, circumferential thickening of the segmental and subsegmental bronchial walls, throughout bilateral lungs, which is nonspecific. Findings are most commonly seen with bronchitis or reactive airway disease, such as asthma. There are small heterogeneous airspace opacities in the right upper lobe (series 4, image 37) and in the left lower lobe (series 4, image 80), which may represent focal pneumonia. There are patchy areas of linear, plate-like atelectasis and/or scarring throughout bilateral lungs. There are bilateral trace pleural effusions with associated atelectatic changes. No suspicious lung nodules. No lung mass or collapse. Musculoskeletal: The visualized soft tissues of the chest wall are grossly unremarkable. No suspicious osseous lesions. There are mild multilevel degenerative changes in the visualized spine. CT ABDOMEN PELVIS FINDINGS Hepatobiliary: The liver is normal in size. Non-cirrhotic configuration. No suspicious mass. No intrahepatic or extrahepatic bile duct dilation. No calcified gallstones. Normal gallbladder wall thickness. No pericholecystic inflammatory changes. Pancreas: Unremarkable. No pancreatic ductal dilatation or surrounding inflammatory changes. Spleen: Within normal limits. No focal lesion. Adrenals/Urinary Tract: Adrenal glands are unremarkable. Small/atrophic bilateral kidneys, right more than left. There are several simple cysts in the bilateral kidneys with largest in the left kidney interpolar region,  posteriorly measuring up to 2.5 x 3.1 cm. No  nephroureterolithiasis or obstructive uropathy on either side. Pick 2 Stomach/Bowel: There is disproportionate dilation (diameter up to 4.2 cm) of stomach, duodenum and proximal jejunal bowel loops. However, there is a transition point in the right paramedian mid abdomen (series 2, image 57 and series 5, image 38), distal to which the distal small bowel loops are nondilated. The colon is also nondilated. Findings are compatible with high-grade small bowel obstruction likely secondary to adhesion. There are no associated tumor. No perienteric fat stranding. No pneumoperitoneum, pneumatosis or portal venous gas. No walled-off abscess or loculated collection. There are multiple colonic diverticula mainly in the sigmoid colon and proximal descending colon without diverticulitis. Stomach is markedly distended and filled with air and fluid. Vascular/Lymphatic: No abdominal or pelvic lymphadenopathy, by size criteria. No aneurysmal dilation of the major abdominal arteries. There are moderate peripheral atherosclerotic vascular calcifications of the aorta and its major branches. Reproductive: The uterus is surgically absent. No large adnexal mass. Other: There is a small fat containing periumbilical hernia. There are also tiny fat containing bilateral inguinal hernias. The soft tissues and abdominal wall are otherwise unremarkable. Musculoskeletal: No suspicious osseous lesions. There are moderate multilevel degenerative changes in the visualized spine. IMPRESSION: 1. There are small heterogeneous airspace opacities in the right upper lobe and left lower lobe, which may represent focal pneumonia. 2. There is high-grade small bowel obstruction with transition point in the right paramedian mid abdomen, as described above, most likely secondary to underlying adhesion. No associated mass or enteritis. No pneumoperitoneum, pneumatosis or portal venous gas. No walled-off abscess or loculated collection. 3. Multiple other nonacute  observations, as described above. Aortic Atherosclerosis (ICD10-I70.0). Electronically Signed   By: Ree Molt M.D.   On: 04/13/2024 17:56   CT ABDOMEN PELVIS WO CONTRAST Result Date: 04/13/2024 CLINICAL DATA:  Pneumonia, complication suspected, xray done; Abdominal pain, acute, nonlocalized. EXAM: CT CHEST, ABDOMEN AND PELVIS WITHOUT CONTRAST TECHNIQUE: Multidetector CT imaging of the chest, abdomen and pelvis was performed following the standard protocol without IV contrast. RADIATION DOSE REDUCTION: This exam was performed according to the departmental dose-optimization program which includes automated exposure control, adjustment of the mA and/or kV according to patient size and/or use of iterative reconstruction technique. COMPARISON:  CT venogram abdomen and pelvis from 04/25/2019 and CT scan chest from 03/05/2017. FINDINGS: CT CHEST FINDINGS Cardiovascular: Normal cardiac size. No pericardial effusion. No aortic aneurysm. There are coronary artery calcifications, in keeping with coronary artery disease. There are also mild-to-moderate peripheral atherosclerotic vascular calcifications of thoracic aorta and its major branches. Mediastinum/Nodes: Visualized thyroid  gland appears grossly unremarkable. No solid / cystic mediastinal masses. There is fluid-filled and dilated esophagus, which is nonspecific but most likely seen in the settings of chronic gastroesophageal reflux disease versus esophageal dysmotility. There are few mildly prominent mediastinal lymph nodes, which do not meet the size criteria for lymphadenopathy and appear grossly similar to the prior study, favoring benign etiology. No axillary lymphadenopathy by size criteria. Evaluation of bilateral hila is limited due to lack on intravenous contrast: however, no large hilar lymphadenopathy identified. Lungs/Pleura: The central tracheo-bronchial tree is patent. There is mild, smooth, circumferential thickening of the segmental and  subsegmental bronchial walls, throughout bilateral lungs, which is nonspecific. Findings are most commonly seen with bronchitis or reactive airway disease, such as asthma. There are small heterogeneous airspace opacities in the right upper lobe (series 4, image 37) and in the left lower lobe (series 4, image 80),  which may represent focal pneumonia. There are patchy areas of linear, plate-like atelectasis and/or scarring throughout bilateral lungs. There are bilateral trace pleural effusions with associated atelectatic changes. No suspicious lung nodules. No lung mass or collapse. Musculoskeletal: The visualized soft tissues of the chest wall are grossly unremarkable. No suspicious osseous lesions. There are mild multilevel degenerative changes in the visualized spine. CT ABDOMEN PELVIS FINDINGS Hepatobiliary: The liver is normal in size. Non-cirrhotic configuration. No suspicious mass. No intrahepatic or extrahepatic bile duct dilation. No calcified gallstones. Normal gallbladder wall thickness. No pericholecystic inflammatory changes. Pancreas: Unremarkable. No pancreatic ductal dilatation or surrounding inflammatory changes. Spleen: Within normal limits. No focal lesion. Adrenals/Urinary Tract: Adrenal glands are unremarkable. Small/atrophic bilateral kidneys, right more than left. There are several simple cysts in the bilateral kidneys with largest in the left kidney interpolar region, posteriorly measuring up to 2.5 x 3.1 cm. No nephroureterolithiasis or obstructive uropathy on either side. Pick 2 Stomach/Bowel: There is disproportionate dilation (diameter up to 4.2 cm) of stomach, duodenum and proximal jejunal bowel loops. However, there is a transition point in the right paramedian mid abdomen (series 2, image 57 and series 5, image 38), distal to which the distal small bowel loops are nondilated. The colon is also nondilated. Findings are compatible with high-grade small bowel obstruction likely secondary to  adhesion. There are no associated tumor. No perienteric fat stranding. No pneumoperitoneum, pneumatosis or portal venous gas. No walled-off abscess or loculated collection. There are multiple colonic diverticula mainly in the sigmoid colon and proximal descending colon without diverticulitis. Stomach is markedly distended and filled with air and fluid. Vascular/Lymphatic: No abdominal or pelvic lymphadenopathy, by size criteria. No aneurysmal dilation of the major abdominal arteries. There are moderate peripheral atherosclerotic vascular calcifications of the aorta and its major branches. Reproductive: The uterus is surgically absent. No large adnexal mass. Other: There is a small fat containing periumbilical hernia. There are also tiny fat containing bilateral inguinal hernias. The soft tissues and abdominal wall are otherwise unremarkable. Musculoskeletal: No suspicious osseous lesions. There are moderate multilevel degenerative changes in the visualized spine. IMPRESSION: 1. There are small heterogeneous airspace opacities in the right upper lobe and left lower lobe, which may represent focal pneumonia. 2. There is high-grade small bowel obstruction with transition point in the right paramedian mid abdomen, as described above, most likely secondary to underlying adhesion. No associated mass or enteritis. No pneumoperitoneum, pneumatosis or portal venous gas. No walled-off abscess or loculated collection. 3. Multiple other nonacute observations, as described above. Aortic Atherosclerosis (ICD10-I70.0). Electronically Signed   By: Ree Molt M.D.   On: 04/13/2024 17:56   DG Chest Port 1 View Result Date: 04/13/2024 CLINICAL DATA:  Questionable sepsis-evaluate for abnormality. Patient unable to hold still for imaging. Multiple attempts were made. Best obtainable images. EXAM: PORTABLE CHEST 1 VIEW COMPARISON:  11/28/2023 FINDINGS: Low lung volumes and patient positioning limits assessment. Grossly stable  cardiomediastinal silhouette. Low lung volumes accentuate pulmonary vascularity. Left lower lung atelectasis or infiltrates. Evidence of pleural effusion or pneumothorax. Marked gaseous distention of the stomach. IMPRESSION: 1. Marked gaseous distention of the stomach. 2. Left lower lung atelectasis or infiltrates. Electronically Signed   By: Norman Gatlin M.D.   On: 04/13/2024 13:29    Microbiology: Results for orders placed or performed during the hospital encounter of 04/13/24  Blood Culture (routine x 2)     Status: None (Preliminary result)   Collection Time: 04/13/24 12:34 PM   Specimen: BLOOD  Result Value  Ref Range Status   Specimen Description BLOOD RIGHT FA  Final   Special Requests   Final    BOTTLES DRAWN AEROBIC AND ANAEROBIC Blood Culture results may not be optimal due to an inadequate volume of blood received in culture bottles   Culture   Final    NO GROWTH 2 DAYS Performed at [facility], [address] [place], [place] [address]    Report Status PENDING  Incomplete  Resp panel by RT-PCR (RSV, Flu A&B, Covid) Anterior Nasal Swab     Status: Abnormal   Collection Time: 04/13/24 12:36 PM   Specimen: Anterior Nasal Swab  Result Value Ref Range Status   SARS Coronavirus 2 by RT PCR POSITIVE (A) NEGATIVE Final    Comment: (NOTE) SARS-CoV-2 target nucleic acids are DETECTED.  The SARS-CoV-2 RNA is generally detectable in upper respiratory specimens during the acute phase of infection. Positive results are indicative of the presence of the identified virus, but do not rule out bacterial infection or co-infection with other pathogens not detected by the test. Clinical correlation with patient history and other diagnostic information is necessary to determine patient infection status. The expected result is Negative.  Fact Sheet for Patients: BloggerCourse.com  Fact Sheet for Healthcare  Providers: SeriousBroker.it  This test is not yet approved or cleared by the [place]  FDA and  has been authorized for detection and/or diagnosis of SARS-CoV-2 by FDA under an Emergency Use Authorization (EUA).  This EUA will remain in effect (meaning this test can be used) for the duration of  the COVID-19 declaration under Section 564(b)(1) of the A ct, 21 U.S.C. section 360bbb-3(b)(1), unless the authorization is terminated or revoked sooner.     Influenza A by PCR NEGATIVE NEGATIVE Final   Influenza B by PCR NEGATIVE NEGATIVE Final    Comment: (NOTE) The Xpert Xpress SARS-CoV-2/FLU/RSV plus assay is intended as an aid in the diagnosis of influenza from Nasopharyngeal swab specimens and should not be used as a sole basis for treatment. Nasal washings and aspirates are unacceptable for Xpert Xpress SARS-CoV-2/FLU/RSV testing.  Fact Sheet for Patients: BloggerCourse.com  Fact Sheet for Healthcare Providers: SeriousBroker.it  This test is not yet approved or cleared by the [place]  FDA and has been authorized for detection and/or diagnosis of SARS-CoV-2 by FDA under an Emergency Use Authorization (EUA). This EUA will remain in effect (meaning this test can be used) for the duration of the COVID-19 declaration under Section 564(b)(1) of the Act, 21 U.S.C. section 360bbb-3(b)(1), unless the authorization is terminated or revoked.     Resp Syncytial Virus by PCR NEGATIVE NEGATIVE Final    Comment: (NOTE) Fact Sheet for Patients: BloggerCourse.com  Fact Sheet for Healthcare Providers: SeriousBroker.it  This test is not yet approved or cleared by the [place]  FDA and has been authorized for detection and/or diagnosis of SARS-CoV-2 by FDA under an Emergency Use Authorization (EUA). This EUA will remain in effect (meaning this test can be  used) for the duration of the COVID-19 declaration under Section 564(b)(1) of the Act, 21 U.S.C. section 360bbb-3(b)(1), unless the authorization is terminated or revoked.  Performed at [facility], [address] [place], [place] [address]   Blood Culture (routine x 2)     Status: None (Preliminary result)   Collection Time: 04/13/24 12:36 PM   Specimen: BLOOD  Result Value Ref Range Status   Specimen Description BLOOD LEFT FA  Final   Special Requests   Final  BOTTLES DRAWN AEROBIC AND ANAEROBIC Blood Culture results may not be optimal due to an inadequate volume of blood received in culture bottles   Culture   Final    NO GROWTH 2 DAYS Performed at [facility], [address] [place], [place] [address]    Report Status PENDING  Incomplete  Urine Culture     Status: None   Collection Time: 04/14/24 12:59 AM   Specimen: Urine, Random  Result Value Ref Range Status   Specimen Description   Final    URINE, RANDOM Performed at [facility] [facility], [address], [place], [place] [address]    Special Requests   Final    NONE Reflexed from [id number] Performed at [facility], [address] [place], [place] [address]    Culture   Final    NO GROWTH Performed at [facility], [address], [place], [place] [address]    Report Status 04/15/2024 FINAL  Final    Labs: CBC: Recent Labs  Lab 04/13/24 1236 04/14/24 0535  WBC 12.3* 8.3  NEUTROABS 8.9*  --   HGB 15.8* 12.6  HCT 46.9* 38.8  MCV 90.0 93.5  PLT 274 205   Basic Metabolic Panel: Recent Labs  Lab 04/13/24 1234 04/14/24 0535  NA 136 141  K 5.0 4.0  CL 94* 103  CO2 24 23  GLUCOSE 129* 87  BUN 59* 73*  CREATININE 2.92* 3.49*  CALCIUM  9.6 7.7*   Liver Function Tests: Recent Labs  Lab 04/13/24 1234  AST 52*  ALT 16  ALKPHOS 60  BILITOT 0.7  PROT 7.7  ALBUMIN 3.5   CBG: No results for input(s): GLUCAP in the last 168 hours.  Discharge time spent:  greater than 30 minutes.  Signed: Cresencio Fairly, MD [facility] 04/15/2024

## 2024-04-15 NOTE — TOC Progression Note (Signed)
 Transition of Care [facility]) - Progression Note    Patient Details  Name: Abigail Strong MRN: [id number] Date of Birth: 1930/03/17  Transition of Care [facility] CM/SW Contact  Dalia GORMAN Fuse, RN Phone Number: 04/15/2024, 9:43 AM  Clinical Narrative:    Patient is actively transitioning, [facility] spoke with the family this am. They would like some time to decide on whether or not they accept [facility].   Expected Discharge Plan: Skilled Nursing Facility Barriers to Discharge: Continued Medical Work up               Expected Discharge Plan and Services       Living arrangements for the past 2 months: Skilled Nursing Facility                                       Social Drivers of Health (SDOH) Interventions SDOH Screenings   Food Insecurity: No Food Insecurity (04/14/2024)  Housing: Low Risk  (04/14/2024)  Transportation Needs: No Transportation Needs (04/14/2024)  Utilities: Not At Risk (04/14/2024)  Alcohol Screen: Low Risk  (05/04/2021)  Depression (PHQ2-9): Low Risk  (05/04/2021)  Financial Resource Strain: High Risk (05/04/2021)  Physical Activity: Inactive (05/04/2021)  Social Connections: Unknown (04/14/2024)  Stress: No Stress Concern Present (05/04/2021)  Tobacco Use: Low Risk  (04/13/2024)    Readmission Risk Interventions    04/14/2024   11:33 AM  Readmission Risk Prevention Plan  PCP or Specialist Appt within 3-5 Days Complete  Social Work Consult for Recovery Care Planning/Counseling Complete  Palliative Care Screening Not Applicable  Medication Review Oceanographer) Complete

## 2024-04-18 LAB — CULTURE, BLOOD (ROUTINE X 2)
Culture: NO GROWTH
Culture: NO GROWTH

## 2024-04-20 DEATH — deceased
